# Patient Record
Sex: Female | Born: 1945 | Race: Black or African American | Hispanic: No | Smoking: Former smoker
Health system: Southern US, Community
[De-identification: ages and names within clinical notes are randomized; demographics above are authoritative.]

## PROBLEM LIST (undated history)

## (undated) DIAGNOSIS — Z8701 Personal history of pneumonia (recurrent): Secondary | ICD-10-CM

## (undated) DIAGNOSIS — R519 Headache, unspecified: Secondary | ICD-10-CM

## (undated) DIAGNOSIS — J449 Chronic obstructive pulmonary disease, unspecified: Secondary | ICD-10-CM

## (undated) DIAGNOSIS — J329 Chronic sinusitis, unspecified: Secondary | ICD-10-CM

## (undated) DIAGNOSIS — E785 Hyperlipidemia, unspecified: Secondary | ICD-10-CM

## (undated) DIAGNOSIS — R51 Headache: Secondary | ICD-10-CM

## (undated) DIAGNOSIS — D126 Benign neoplasm of colon, unspecified: Secondary | ICD-10-CM

## (undated) DIAGNOSIS — J302 Other seasonal allergic rhinitis: Secondary | ICD-10-CM

## (undated) DIAGNOSIS — M1990 Unspecified osteoarthritis, unspecified site: Secondary | ICD-10-CM

## (undated) DIAGNOSIS — E039 Hypothyroidism, unspecified: Secondary | ICD-10-CM

## (undated) DIAGNOSIS — I10 Essential (primary) hypertension: Secondary | ICD-10-CM

## (undated) DIAGNOSIS — I341 Nonrheumatic mitral (valve) prolapse: Secondary | ICD-10-CM

## (undated) DIAGNOSIS — R0600 Dyspnea, unspecified: Secondary | ICD-10-CM

## (undated) DIAGNOSIS — M8580 Other specified disorders of bone density and structure, unspecified site: Secondary | ICD-10-CM

## (undated) HISTORY — DX: Hyperlipidemia, unspecified: E78.5

## (undated) HISTORY — DX: Other specified disorders of bone density and structure, unspecified site: M85.80

## (undated) HISTORY — DX: Nonrheumatic mitral (valve) prolapse: I34.1

## (undated) HISTORY — PX: DILATION AND CURETTAGE OF UTERUS: SHX78

## (undated) HISTORY — DX: Headache, unspecified: R51.9

## (undated) HISTORY — DX: Other seasonal allergic rhinitis: J30.2

## (undated) HISTORY — PX: TONSILLECTOMY: SUR1361

## (undated) HISTORY — DX: Chronic obstructive pulmonary disease, unspecified: J44.9

## (undated) HISTORY — DX: Headache: R51

## (undated) HISTORY — PX: THYROIDECTOMY: SHX17

## (undated) HISTORY — DX: Benign neoplasm of colon, unspecified: D12.6

## (undated) HISTORY — DX: Personal history of pneumonia (recurrent): Z87.01

## (undated) HISTORY — DX: Unspecified osteoarthritis, unspecified site: M19.90

## (undated) HISTORY — PX: CATARACT EXTRACTION: SUR2

## (undated) HISTORY — DX: Chronic sinusitis, unspecified: J32.9

## (undated) HISTORY — DX: Hypothyroidism, unspecified: E03.9

---

## 2000-10-16 ENCOUNTER — Emergency Department (HOSPITAL_COMMUNITY): Admission: EM | Admit: 2000-10-16 | Discharge: 2000-10-16 | Payer: Self-pay | Admitting: Emergency Medicine

## 2000-10-31 ENCOUNTER — Emergency Department (HOSPITAL_COMMUNITY): Admission: EM | Admit: 2000-10-31 | Discharge: 2000-10-31 | Payer: Self-pay | Admitting: Emergency Medicine

## 2001-02-03 ENCOUNTER — Inpatient Hospital Stay (HOSPITAL_COMMUNITY): Admission: EM | Admit: 2001-02-03 | Discharge: 2001-02-05 | Payer: Self-pay | Admitting: *Deleted

## 2001-12-10 ENCOUNTER — Encounter: Admission: RE | Admit: 2001-12-10 | Discharge: 2001-12-10 | Payer: Self-pay | Admitting: Family Medicine

## 2001-12-10 ENCOUNTER — Encounter: Payer: Self-pay | Admitting: Family Medicine

## 2003-01-24 LAB — HM PAP SMEAR

## 2005-08-08 ENCOUNTER — Emergency Department (HOSPITAL_COMMUNITY): Admission: EM | Admit: 2005-08-08 | Discharge: 2005-08-08 | Payer: Self-pay | Admitting: Emergency Medicine

## 2010-02-13 ENCOUNTER — Encounter: Payer: Self-pay | Admitting: Interventional Cardiology

## 2010-03-12 ENCOUNTER — Emergency Department (HOSPITAL_COMMUNITY): Payer: Medicare Other

## 2010-03-12 ENCOUNTER — Emergency Department (HOSPITAL_COMMUNITY)
Admission: EM | Admit: 2010-03-12 | Discharge: 2010-03-12 | Disposition: A | Discharge status: Home / Self Care | Payer: Medicare Other | Attending: Emergency Medicine | Admitting: Emergency Medicine

## 2010-03-12 DIAGNOSIS — J449 Chronic obstructive pulmonary disease, unspecified: Secondary | ICD-10-CM | POA: Insufficient documentation

## 2010-03-12 DIAGNOSIS — J3489 Other specified disorders of nose and nasal sinuses: Secondary | ICD-10-CM | POA: Insufficient documentation

## 2010-03-12 DIAGNOSIS — R0982 Postnasal drip: Secondary | ICD-10-CM | POA: Insufficient documentation

## 2010-03-12 DIAGNOSIS — R0602 Shortness of breath: Secondary | ICD-10-CM | POA: Insufficient documentation

## 2010-03-12 DIAGNOSIS — R05 Cough: Secondary | ICD-10-CM | POA: Insufficient documentation

## 2010-03-12 DIAGNOSIS — R059 Cough, unspecified: Secondary | ICD-10-CM | POA: Insufficient documentation

## 2010-03-12 DIAGNOSIS — J4489 Other specified chronic obstructive pulmonary disease: Secondary | ICD-10-CM | POA: Insufficient documentation

## 2010-06-24 LAB — HM MAMMOGRAPHY

## 2010-07-05 ENCOUNTER — Encounter: Payer: Self-pay | Admitting: Gastroenterology

## 2010-07-14 ENCOUNTER — Encounter: Payer: Self-pay | Admitting: Family Medicine

## 2010-07-14 DIAGNOSIS — J449 Chronic obstructive pulmonary disease, unspecified: Secondary | ICD-10-CM

## 2010-07-14 DIAGNOSIS — E039 Hypothyroidism, unspecified: Secondary | ICD-10-CM

## 2010-07-14 DIAGNOSIS — R519 Headache, unspecified: Secondary | ICD-10-CM

## 2010-07-14 DIAGNOSIS — M8580 Other specified disorders of bone density and structure, unspecified site: Secondary | ICD-10-CM

## 2010-07-14 DIAGNOSIS — E785 Hyperlipidemia, unspecified: Secondary | ICD-10-CM

## 2010-07-25 ENCOUNTER — Encounter: Payer: Self-pay | Admitting: Gastroenterology

## 2010-07-25 ENCOUNTER — Ambulatory Visit (AMBULATORY_SURGERY_CENTER): Payer: Medicare Other | Admitting: *Deleted

## 2010-07-25 VITALS — Ht 65.0 in | Wt 174.9 lb

## 2010-07-25 DIAGNOSIS — Z1211 Encounter for screening for malignant neoplasm of colon: Secondary | ICD-10-CM

## 2010-07-25 MED ORDER — PEG-KCL-NACL-NASULF-NA ASC-C 100 G PO SOLR: ORAL | Status: DC

## 2010-07-29 ENCOUNTER — Other Ambulatory Visit: Payer: Self-pay | Admitting: Family Medicine

## 2010-07-29 ENCOUNTER — Ambulatory Visit (HOSPITAL_COMMUNITY)
Admission: RE | Admit: 2010-07-29 | Discharge: 2010-07-29 | Disposition: A | Discharge status: Home / Self Care | Payer: Medicare Other | Source: Ambulatory Visit | Attending: Family Medicine | Admitting: Family Medicine

## 2010-07-29 DIAGNOSIS — M5137 Other intervertebral disc degeneration, lumbosacral region: Secondary | ICD-10-CM | POA: Insufficient documentation

## 2010-07-29 DIAGNOSIS — M545 Low back pain, unspecified: Secondary | ICD-10-CM

## 2010-07-29 DIAGNOSIS — M51379 Other intervertebral disc degeneration, lumbosacral region without mention of lumbar back pain or lower extremity pain: Secondary | ICD-10-CM | POA: Insufficient documentation

## 2010-08-10 ENCOUNTER — Ambulatory Visit (AMBULATORY_SURGERY_CENTER): Payer: Medicare Other | Admitting: Gastroenterology

## 2010-08-10 ENCOUNTER — Encounter: Payer: Self-pay | Admitting: Gastroenterology

## 2010-08-10 DIAGNOSIS — Z1211 Encounter for screening for malignant neoplasm of colon: Secondary | ICD-10-CM

## 2010-08-10 DIAGNOSIS — D126 Benign neoplasm of colon, unspecified: Secondary | ICD-10-CM

## 2010-08-10 MED ORDER — SODIUM CHLORIDE 0.9 % IV SOLN: 500.0000 mL | INTRAVENOUS | Status: DC

## 2010-08-10 NOTE — Patient Instructions (Addendum)
FOLLOW DISCHARGE INSTRUCTIONS( BLUE& GREEN SHEETS)    iNFORMATION ON POLYPS GIVEN

## 2010-08-11 ENCOUNTER — Telehealth: Payer: Self-pay | Admitting: *Deleted

## 2010-08-11 NOTE — Telephone Encounter (Signed)
No ID on voice mail.   No message left. 

## 2010-10-11 ENCOUNTER — Ambulatory Visit (HOSPITAL_COMMUNITY)
Admission: RE | Admit: 2010-10-11 | Discharge: 2010-10-11 | Disposition: A | Discharge status: Home / Self Care | Payer: Medicare Other | Source: Ambulatory Visit | Attending: Family Medicine | Admitting: Family Medicine

## 2010-10-11 ENCOUNTER — Other Ambulatory Visit: Payer: Self-pay | Admitting: Family Medicine

## 2010-10-11 DIAGNOSIS — M238X9 Other internal derangements of unspecified knee: Secondary | ICD-10-CM

## 2010-10-11 DIAGNOSIS — M25569 Pain in unspecified knee: Secondary | ICD-10-CM | POA: Insufficient documentation

## 2011-05-31 ENCOUNTER — Other Ambulatory Visit (HOSPITAL_COMMUNITY): Payer: Self-pay | Admitting: Orthopedic Surgery

## 2011-05-31 DIAGNOSIS — M25569 Pain in unspecified knee: Secondary | ICD-10-CM

## 2011-06-02 ENCOUNTER — Ambulatory Visit (HOSPITAL_COMMUNITY)
Admission: RE | Admit: 2011-06-02 | Discharge: 2011-06-02 | Disposition: A | Discharge status: Home / Self Care | Payer: Medicare Other | Source: Ambulatory Visit | Attending: Orthopedic Surgery | Admitting: Orthopedic Surgery

## 2011-06-02 DIAGNOSIS — M25569 Pain in unspecified knee: Secondary | ICD-10-CM

## 2011-06-02 DIAGNOSIS — M23302 Other meniscus derangements, unspecified lateral meniscus, unspecified knee: Secondary | ICD-10-CM | POA: Insufficient documentation

## 2011-06-02 DIAGNOSIS — M25469 Effusion, unspecified knee: Secondary | ICD-10-CM | POA: Insufficient documentation

## 2011-06-02 DIAGNOSIS — M898X9 Other specified disorders of bone, unspecified site: Secondary | ICD-10-CM | POA: Insufficient documentation

## 2011-08-09 ENCOUNTER — Encounter: Payer: Self-pay | Admitting: Gastroenterology

## 2011-09-18 ENCOUNTER — Ambulatory Visit (INDEPENDENT_AMBULATORY_CARE_PROVIDER_SITE_OTHER): Payer: Medicare Other | Admitting: Gastroenterology

## 2011-09-18 ENCOUNTER — Encounter: Payer: Self-pay | Admitting: Gastroenterology

## 2011-09-18 VITALS — BP 102/72 | HR 76 | Ht 65.25 in | Wt 181.1 lb

## 2011-09-18 DIAGNOSIS — Z860100 Personal history of colon polyps, unspecified: Secondary | ICD-10-CM | POA: Insufficient documentation

## 2011-09-18 DIAGNOSIS — K222 Esophageal obstruction: Secondary | ICD-10-CM

## 2011-09-18 NOTE — Assessment & Plan Note (Signed)
Plan follow-up colonoscopy 2017 

## 2011-09-18 NOTE — Patient Instructions (Addendum)
Upper GI Endoscopy Upper GI endoscopy means using a flexible scope to look at the esophagus, stomach, and upper small bowel. This is done to make a diagnosis in people with heartburn, abdominal pain, or abnormal bleeding. Sometimes an endoscope is needed to remove foreign bodies or food that become stuck in the esophagus; it can also be used to take biopsy samples. For the best results, do not eat or drink for 8 hours before having your upper endoscopy.  To perform the endoscopy, you will probably be sedated and your throat will be numbed with a special spray. The endoscope is then slowly passed down your throat (this will not interfere with your breathing). An endoscopy exam takes 15 to 30 minutes to complete and there is no real pain. Patients rarely remember much about the procedure. The results of the test may take several days if a biopsy or other test is taken.  You may have a sore throat after an endoscopy exam. Serious complications are very rare. Stick to liquids and soft foods until your pain is better. Do not drive a car or operate any dangerous equipment for at least 24 hours after being sedated. SEEK IMMEDIATE MEDICAL CARE IF:   You have severe throat pain.   You have shortness of breath.   You have bleeding problems.   You have a fever.   You have difficulty recovering from your sedation.  Document Released: 02/17/2004 Document Revised: 12/29/2010 Document Reviewed: 01/12/2008 [facility] Patient Information 2012 [place], [place].  Esophageal Stricture The esophagus is the long, narrow tube which carries food and liquid from the mouth to the stomach. Sometimes a part of the esophagus becomes narrow and makes it difficult, painful, or even impossible to swallow. This is called an esophageal stricture.  CAUSES  Common causes of blockage or strictures of the esophagus are:  Exposure of the lower esophagus to the acid from the stomach may cause narrowing.   Hiatal hernia in which a  small part of the stomach bulges up through the diaphragm can cause a narrowing in the bottom of the esophagus.   Scleroderma is a tissue disorder that affects the esophagus and makes swallowing difficult.   Achalasia is an absence of nerves in the lower esophagus and to the esophageal sphincter. This absence of nerves may be congenital (present since birth). This can cause irregular spasms which do not allow food and fluid through.   Strictures may develop from swallowing materials which damage the esophagus. Examples are acids or alkalis such as lye.   Schatzki's Ring is a narrow ring of non-cancerous tissue which narrows the lower esophagus. The cause of this is unknown.   Growths can block the esophagus.  SYMPTOMS  Some of the problems are difficulty swallowing or pain with swallowing. DIAGNOSIS  Your caregiver often suspects this problem by taking a medical history. They will also do a physical exam. They may then take X-rays and/or perform an endoscopy. Endoscopy is an exam in which a tube like a small flexible telescope is used to look at your esophagus.  TREATMENT  One form of treatment is to dilate the narrow area. This means to stretch it.   When this is not successful, chest surgery may be required. This is a much more extensive form of treatment with a longer recovery time.  Both of the above treatments make the passage of food and water into the stomach easier. They also make it easier for stomach contents to bubble back into the esophagus.  Special medications may be used following the procedure to help prevent further narrowing. Medications may be used to lower the amount of acid in the stomach juice.  SEEK IMMEDIATE MEDICAL CARE IF:   Your swallowing is becoming more painful, difficult, or you are unable to swallow.   You vomit up blood.   You develop black tarry stools.   You develop chills.   You have a fever.   You develop chest or abdominal pain.   You develop  shortness of breath, feel lightheaded, or faint.  Follow up with medical care as your caregiver suggests. Document Released: 09/19/2005 Document Revised: 12/29/2010 Document Reviewed: 10/26/2005 [facility] Patient Information 2012 [place], [place].

## 2011-09-18 NOTE — Progress Notes (Signed)
History of Present Illness: Pleasant 66 year old Afro-American female referred at the request of Dr. Tanya Nones for evaluation of dysphagia. She has been complaining of dysphagia for several months. Approximately 7 weeks ago she had a food impaction while on vacation. This was removed endoscopically. He has occasional pyrosis.    Past Medical History  Diagnosis Date  . MVP (mitral valve prolapse)     palpitations  . Generalized headaches   . COPD (chronic obstructive pulmonary disease)   . Elevated lipids   . Osteopenia   . Hypothyroidism   . Arthritis   . History of pneumonia    Past Surgical History  Procedure Date  . Thyroidectomy   . Dilation and curettage of uterus    family history includes Breast cancer in her maternal aunt; Diabetes in her brother; and Heart attack in her maternal grandmother, mother, and unspecified family member.  There is no history of Colon cancer. Current Outpatient Prescriptions  Medication Sig Dispense Refill  . Acetylcysteine (NAC) 600 MG CAPS Take 1 capsule by mouth daily.      Marland Kitchen albuterol (PROAIR HFA) 108 (90 BASE) MCG/ACT inhaler Inhale 2 puffs into the lungs every 6 (six) hours as needed.        . AMBULATORY NON FORMULARY MEDICATION Medication Name: Sovereign Silver Bio-Active silver hydrosol Take 6 drops under the tongue every day      . Ascorbic Acid (VITAMIN C) 1000 MG tablet Take 1,000 mg by mouth daily.      Marland Kitchen aspirin 81 MG tablet Take 81 mg by mouth daily.        . cetirizine (ZYRTEC) 10 MG tablet Take 10 mg by mouth daily.      . Cholecalciferol (VITAMIN D-3) 1000 UNITS CAPS Take 1 capsule by mouth daily.      Marland Kitchen dextromethorphan-guaiFENesin (MUCINEX DM) 30-600 MG per 12 hr tablet Take 1 tablet by mouth daily.      . fluticasone (FLONASE) 50 MCG/ACT nasal spray Place 2 sprays into the nose daily.      . Fluticasone-Salmeterol (ADVAIR DISKUS) 250-50 MCG/DOSE AEPB Inhale 1 puff into the lungs every 12 (twelve) hours.        . Homeopathic  Products (ARNICA [place] PO) Take 4 tablets by mouth daily.      Marland Kitchen levothyroxine (SYNTHROID, LEVOTHROID) 50 MCG tablet Take 50 mcg by mouth daily.      Marland Kitchen omeprazole (PRILOSEC OTC) 20 MG tablet Take 20 mg by mouth daily.      . simvastatin (ZOCOR) 40 MG tablet Take 1 tablet by mouth Daily.       Current Facility-Administered Medications  Medication Dose Route Frequency Provider Last Rate Last Dose  . 0.9 %  sodium chloride infusion  500 mL Intravenous Continuous Louis Meckel, MD       Allergies as of 09/18/2011  . (No Known Allergies)    reports that she quit smoking about 6 years ago. She has never used smokeless tobacco. She reports that she does not drink alcohol or use illicit drugs.     Review of Systems: Pertinent positive and negative review of systems were noted in the above HPI section. All other review of systems were otherwise negative.  Vital signs were reviewed in today's medical record Physical Exam: General: Well developed , well nourished, no acute distress Head: Normocephalic and atraumatic Eyes:  sclerae anicteric, EOMI Ears: Normal auditory acuity Mouth: No deformity or lesions Neck: Supple, no masses or thyromegaly Lungs: Clear throughout to auscultation Heart:  Regular rate and rhythm; no murmurs, rubs or bruits Abdomen: Soft, non tender and non distended. No masses, hepatosplenomegaly or hernias noted. Normal Bowel sounds Rectal:deferred Musculoskeletal: Symmetrical with no gross deformities  Skin: No lesions on visible extremities Pulses:  Normal pulses noted Extremities: No clubbing, cyanosis, edema or deformities noted Neurological: Alert oriented x 4, grossly nonfocal Cervical Nodes:  No significant cervical adenopathy Inguinal Nodes: No significant inguinal adenopathy Psychological:  Alert and cooperative. Normal mood and affect

## 2011-09-20 NOTE — Assessment & Plan Note (Signed)
History of recent food impaction an ongoing complaints of dysphagia.  Medications #1 upper endoscopy with dilatation as indicated

## 2011-09-21 ENCOUNTER — Ambulatory Visit (AMBULATORY_SURGERY_CENTER): Payer: Medicare Other | Admitting: Gastroenterology

## 2011-09-21 ENCOUNTER — Encounter: Payer: Self-pay | Admitting: Gastroenterology

## 2011-09-21 VITALS — BP 143/83 | HR 76 | Temp 98.7°F | Resp 30 | Ht 63.0 in | Wt 181.0 lb

## 2011-09-21 DIAGNOSIS — K222 Esophageal obstruction: Secondary | ICD-10-CM

## 2011-09-21 MED ORDER — SODIUM CHLORIDE 0.9 % IV SOLN: 500.0000 mL | INTRAVENOUS | Status: DC

## 2011-09-21 NOTE — Op Note (Addendum)
[facility] [address] [place], [address]   ENDOSCOPY PROCEDURE REPORT  PATIENT: Mary Blevins, Mary Blevins  MR#: [id number] BIRTHDATE: 1945/08/06 , 66  yrs. old GENDER: Female ENDOSCOPIST: Louis Meckel, MD REFERRED BY:  Lynnea Ferrier, M.D. PROCEDURE DATE:  09/21/2011 PROCEDURE:  Elease Hashimoto dilation of esophagus and EGD, diagnostic ASA CLASS:     Class II INDICATIONS:  Recent food impaction in the esophagus;  dysphagia. MEDICATIONS: MAC sedation, administered by CRNA and Propofol (Diprivan) 120 mg IV TOPICAL ANESTHETIC:  DESCRIPTION OF PROCEDURE: After the risks benefits and alternatives of the procedure were thoroughly explained, informed consent was obtained.  The LB GIF-H180 [id number] endoscope was introduced through the mouth and advanced to the second portion of the duodenum. Without limitations.  The instrument was slowly withdrawn as the mucosa was fully examined.      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  52Fr maloney dilator was passed wit moderate resistance.  A small amount of heme was present on the dilator tip.   The remainder of the upper endoscopy exam was otherwise normal. Retroflexed views revealed no abnormalities.     The scope was then withdrawn from the patient and the procedure completed.  COMPLICATIONS: There were no complications. ENDOSCOPIC IMPRESSION: 1.   Stricture was found at the gastroesophageal junction - s/p maloney dilator 2.   The remainder of the upper endoscopy exam was otherwise normal  RECOMMENDATIONS: Call to schedule a follow-up appointment with office 1 month(s)  REPEAT EXAM:  eSigned:  Louis Meckel, MD 10/19/2011 2:24 PM Revised: 10/19/2011 2:24 PM  CC:

## 2011-09-21 NOTE — Patient Instructions (Addendum)
YOU HAD AN ENDOSCOPIC PROCEDURE TODAY AT THE [facility]: Refer to the procedure report that was given to you for any specific questions about what was found during the examination.  If the procedure report does not answer your questions, please call your gastroenterologist to clarify.  If you requested that your care partner not be given the details of your procedure findings, then the procedure report has been included in a sealed envelope for you to review at your convenience later.   DIET: Your first meal following the procedure should be a light meal and then it is ok to progress to your normal diet.  A half-sandwich or bowl of soup is an example of a good first meal.  Heavy or fried foods are harder to digest and may make you feel nauseous or bloated.  Likewise meals heavy in dairy and vegetables can cause extra gas to form and this can also increase the bloating.  Drink plenty of fluids but you should avoid alcoholic beverages for 24 hours.  ACTIVITY: Your care partner should take you home directly after the procedure.  You should plan to take it easy, moving slowly for the rest of the day.  You can resume normal activity the day after the procedure however you should NOT DRIVE or use heavy machinery for 24 hours (because of the sedation medicines used during the test).    SYMPTOMS TO REPORT IMMEDIATELY: A gastroenterologist can be reached at any hour.  During normal business hours, 8:30 AM to 5:00 PM Monday through Friday, call [phone].  After hours and on weekends, please call the GI answering service at [phone] who will take a message and have the physician on call contact you.   Following upper endoscopy (EGD)  Vomiting of blood or coffee ground material  New chest pain or pain under the shoulder blades  Painful or persistently difficult swallowing  New shortness of breath  Fever of 100F or higher  Black, tarry-looking stools  FOLLOW UP: If any biopsies were  taken you will be contacted by phone or by letter within the next 1-3 weeks.  Call your gastroenterologist if you have not heard about the biopsies in 3 weeks.  Our staff will call the home number listed on your records the next business day following your procedure to check on you and address any questions or concerns that you may have at that time regarding the information given to you following your procedure. This is a courtesy call and so if there is no answer at the home number and we have not heard from you through the emergency physician on call, we will assume that you have returned to your regular daily activities without incident.  SIGNATURES/CONFIDENTIALITY: You and/or your care partner have signed paperwork which will be entered into your electronic medical record.  These signatures attest to the fact that that the information above on your After Visit Summary has been reviewed and is understood.  Full responsibility of the confidentiality of this discharge information lies with you and/or your care-partner.  

## 2011-09-21 NOTE — Progress Notes (Addendum)
Patient did not have preoperative order for IV antibiotic SSI prophylaxis. (G8918)  Patient did not experience any of the following events: a burn prior to discharge; a fall within the facility; wrong site/side/patient/procedure/implant event; or a hospital transfer or hospital admission upon discharge from the facility. (G8907)  

## 2011-09-22 ENCOUNTER — Telehealth: Payer: Self-pay

## 2011-09-22 NOTE — Telephone Encounter (Signed)
  Follow up Call-  Call back number 09/21/2011 08/10/2010  Post procedure Call Back phone  # [phone] hm [phone]  Permission to leave phone message Yes -     Patient questions:  Do you have a fever, pain , or abdominal swelling? no Pain Score  0 *  Have you tolerated food without any problems? yes  Have you been able to return to your normal activities? yes  Do you have any questions about your discharge instructions: Diet   no Medications  no Follow up visit  no  Do you have questions or concerns about your Care? no  Actions: * If pain score is 4 or above: No action needed, pain <4.

## 2011-10-19 ENCOUNTER — Encounter: Payer: Self-pay | Admitting: Gastroenterology

## 2011-10-19 ENCOUNTER — Ambulatory Visit (INDEPENDENT_AMBULATORY_CARE_PROVIDER_SITE_OTHER): Payer: Medicare Other | Admitting: Gastroenterology

## 2011-10-19 VITALS — BP 108/72 | HR 72 | Ht 63.0 in | Wt 178.8 lb

## 2011-10-19 DIAGNOSIS — K222 Esophageal obstruction: Secondary | ICD-10-CM

## 2011-10-19 NOTE — Assessment & Plan Note (Signed)
Asymptomatic following Maloney dilatation. Plan to repeat when necessary

## 2011-10-19 NOTE — Progress Notes (Signed)
History of Present Illness:  Mary Blevins has returned following dilation of a distal esophageal stricture. Dysphagia has entirely subsided. Except for occasional pyrosis she has no GI complaints.    Review of Systems: Pertinent positive and negative review of systems were noted in the above HPI section. All other review of systems were otherwise negative.    Current Medications, Allergies, Past Medical History, Past Surgical History, Family History and Social History were reviewed in [employer] electronic medical record  Vital signs were reviewed in today's medical record. Physical Exam: General: Well developed , well nourished, no acute distress

## 2011-10-19 NOTE — Patient Instructions (Addendum)
Please follow up as needed per Dr. Arlyce Dice

## 2012-04-09 ENCOUNTER — Telehealth: Payer: Self-pay | Admitting: Family Medicine

## 2012-04-09 DIAGNOSIS — E039 Hypothyroidism, unspecified: Secondary | ICD-10-CM

## 2012-04-09 DIAGNOSIS — J449 Chronic obstructive pulmonary disease, unspecified: Secondary | ICD-10-CM

## 2012-04-09 MED ORDER — FLUTICASONE-SALMETEROL 250-50 MCG/DOSE IN AEPB: 1.0000 | INHALATION_SPRAY | Freq: Two times a day (BID) | RESPIRATORY_TRACT | Status: DC

## 2012-04-09 MED ORDER — LEVOTHYROXINE SODIUM 50 MCG PO TABS: 50.0000 ug | ORAL_TABLET | Freq: Every day | ORAL | Status: DC

## 2012-04-09 NOTE — Telephone Encounter (Signed)
Medication refilled per protocol. 

## 2012-04-10 ENCOUNTER — Telehealth: Payer: Self-pay | Admitting: Family Medicine

## 2012-04-10 NOTE — Telephone Encounter (Signed)
Refill for advair not needed.  Refill was documented as done 04/09/2012

## 2012-04-11 ENCOUNTER — Telehealth: Payer: Self-pay | Admitting: Family Medicine

## 2012-04-11 ENCOUNTER — Encounter: Payer: Self-pay | Admitting: Family Medicine

## 2012-04-11 MED ORDER — SIMVASTATIN 40 MG PO TABS: 40.0000 mg | ORAL_TABLET | Freq: Every day | ORAL | Status: DC

## 2012-04-11 NOTE — Telephone Encounter (Signed)
Can you please make this an erroneous encounter. Rob distracted me. Haha! Thanks

## 2012-04-11 NOTE — Telephone Encounter (Signed)
Done

## 2012-04-11 NOTE — Telephone Encounter (Signed)
This encounter was created in error - please disregard.

## 2012-05-30 ENCOUNTER — Ambulatory Visit (INDEPENDENT_AMBULATORY_CARE_PROVIDER_SITE_OTHER): Payer: Medicare Other | Admitting: Family Medicine

## 2012-05-30 ENCOUNTER — Encounter: Payer: Self-pay | Admitting: Family Medicine

## 2012-05-30 VITALS — BP 140/80 | HR 80 | Temp 98.5°F | Resp 18 | Wt 184.0 lb

## 2012-05-30 DIAGNOSIS — R05 Cough: Secondary | ICD-10-CM

## 2012-05-30 DIAGNOSIS — J302 Other seasonal allergic rhinitis: Secondary | ICD-10-CM

## 2012-05-30 DIAGNOSIS — J309 Allergic rhinitis, unspecified: Secondary | ICD-10-CM

## 2012-05-30 DIAGNOSIS — R059 Cough, unspecified: Secondary | ICD-10-CM

## 2012-05-30 MED ORDER — BENZONATATE 200 MG PO CAPS: 200.0000 mg | ORAL_CAPSULE | Freq: Three times a day (TID) | ORAL | Status: DC | PRN

## 2012-05-30 MED ORDER — CETIRIZINE HCL 10 MG PO TABS: 10.0000 mg | ORAL_TABLET | Freq: Every day | ORAL | Status: DC

## 2012-05-30 NOTE — Progress Notes (Signed)
Subjective:    Patient ID: Mary Blevins, female    DOB: 02-17-1945, 67 y.o.   MRN: [id number]  HPI 3 days of scratchy throat postnasal drip and itchy watery eyes.  Now she's having a cough is productive of clear sputum. She denies any wheezing. She denies any fever. She denies any chills. She denies any chest pain. She's not been using her inhaler.  She has had no known sick contacts. Past Medical History  Diagnosis Date  . MVP (mitral valve prolapse)     palpitations  . Generalized headaches   . COPD (chronic obstructive pulmonary disease)   . Elevated lipids   . Osteopenia   . Hypothyroidism   . Arthritis   . History of pneumonia   . Hyperlipidemia   . Sinusitis    Current Outpatient Prescriptions on File Prior to Visit  Medication Sig Dispense Refill  . albuterol (PROAIR HFA) 108 (90 BASE) MCG/ACT inhaler Inhale 2 puffs into the lungs every 6 (six) hours as needed.        Marland Kitchen aspirin 81 MG tablet Take 81 mg by mouth daily.        . Cholecalciferol (VITAMIN D-3) 1000 UNITS CAPS Take 1 capsule by mouth daily.      Marland Kitchen dextromethorphan-guaiFENesin (MUCINEX DM) 30-600 MG per 12 hr tablet Take 1 tablet by mouth daily.      . fluticasone (FLONASE) 50 MCG/ACT nasal spray Place 2 sprays into the nose daily.      . Fluticasone-Salmeterol (ADVAIR DISKUS) 250-50 MCG/DOSE AEPB Inhale 1 puff into the lungs every 12 (twelve) hours.  60 each  5  . levothyroxine (SYNTHROID, LEVOTHROID) 50 MCG tablet Take 1 tablet (50 mcg total) by mouth daily.  30 tablet  5  . naphazoline-glycerin (CLEAR EYES) 0.012-0.2 % SOLN Place 1-2 drops into both eyes every 4 (four) hours as needed.      . simvastatin (ZOCOR) 40 MG tablet Take 1 tablet (40 mg total) by mouth at bedtime.  30 tablet  5   Current Facility-Administered Medications on File Prior to Visit  Medication Dose Route Frequency Provider Last Rate Last Dose  . 0.9 %  sodium chloride infusion  500 mL Intravenous Continuous Louis Meckel, MD        No Known Allergies History   Social History  . Marital Status: Widowed    Spouse Name: N/A    Number of Children: 0  . Years of Education: N/A   Occupational History  .     Social History Main Topics  . Smoking status: Former Smoker    Quit date: 07/24/2005  . Smokeless tobacco: Never Used  . Alcohol Use: No  . Drug Use: No  . Sexually Active:    Other Topics Concern  . Not on file   Social History Narrative  . No narrative on file      Review of Systems Remainder of review of systems is negative    Objective:   Physical Exam  Constitutional: She appears well-developed and well-nourished.  HENT:  Right Ear: External ear normal.  Left Ear: External ear normal.  Nose: Nose normal.  Mouth/Throat: Oropharynx is clear and moist.  Eyes: Conjunctivae are normal. Pupils are equal, round, and reactive to light.  Neck: Neck supple.  Cardiovascular: Normal rate and normal heart sounds.   Pulmonary/Chest: Effort normal and breath sounds normal. No respiratory distress. She has no wheezes. She has no rales. She exhibits no tenderness.  Lymphadenopathy:  She has no cervical adenopathy.          Assessment & Plan:  1. Seasonal allergies Is mainly her allergies it may be slightly exacerbating her underlying COPD. Therefore I'm going to add Zyrtec 10 mg by mouth daily to her Flonase. The patient is instructed to call me immediately if she develops wheezing or purulent sputum or fever. I would have a low threshold for antibiotics and steroids in this patient. However right now I will treat the allergic trigger I feel is causing her symptoms.  2. Cough Symptomatic therapy with Tessalon Perles 200 mg Q8 hours when necessary cough.

## 2012-07-19 ENCOUNTER — Encounter: Payer: Self-pay | Admitting: Family Medicine

## 2012-07-22 ENCOUNTER — Encounter: Payer: Self-pay | Admitting: Family Medicine

## 2012-07-22 ENCOUNTER — Ambulatory Visit (INDEPENDENT_AMBULATORY_CARE_PROVIDER_SITE_OTHER): Payer: Medicare Other | Admitting: Family Medicine

## 2012-07-22 ENCOUNTER — Ambulatory Visit: Payer: Self-pay | Admitting: Family Medicine

## 2012-07-22 VITALS — BP 100/60 | HR 60 | Temp 97.2°F | Resp 16 | Wt 182.0 lb

## 2012-07-22 DIAGNOSIS — M545 Low back pain, unspecified: Secondary | ICD-10-CM

## 2012-07-22 DIAGNOSIS — M25559 Pain in unspecified hip: Secondary | ICD-10-CM

## 2012-07-22 DIAGNOSIS — M549 Dorsalgia, unspecified: Secondary | ICD-10-CM

## 2012-07-22 DIAGNOSIS — M25552 Pain in left hip: Secondary | ICD-10-CM | POA: Insufficient documentation

## 2012-07-22 MED ORDER — TRAMADOL HCL 50 MG PO TABS: ORAL_TABLET | ORAL | Status: DC

## 2012-07-22 MED ORDER — NAPROXEN 500 MG PO TABS: 500.0000 mg | ORAL_TABLET | Freq: Two times a day (BID) | ORAL | Status: DC

## 2012-07-22 NOTE — Assessment & Plan Note (Signed)
Chronic back pain, obtain lumbar xray no imaging, start naprosyn BID, ultram as needed F/u ortho

## 2012-07-22 NOTE — Patient Instructions (Addendum)
Naprosyn for inflammation Ultram for pain Get the xrays of back and hip We will send copies to Dr. Merlyn Albert  F/U as needed

## 2012-07-22 NOTE — Assessment & Plan Note (Signed)
Concern for bursitis based on exam and description, obtain xray of hip, other differential is OA hip NSAIDS, continue exercises, f/u ortho as scheduled

## 2012-07-22 NOTE — Progress Notes (Signed)
  Subjective:    Patient ID: Mary Blevins, female    DOB: 12-16-1945, 67 y.o.   MRN: [id number]  HPI  Pt here with chronic back pain and left hip pain. 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 Denies tingling numbness in lower extremity. Left hip pain for the past 3 weeks, no fall. Has pain right in joint, sore to touch. Has appt with Dr. Merlyn Albert who is her orthopedist in 2 weeks.She feels very stiff in the morning and after sitting long periods of time after she gets going in AM back improves Takes OTC arthritis pain reliever with some improvement  Review of Systems  -per above  GEN- denies fatigue, fever, weight loss,weakness, recent illness GU- denies dysuria, hematuria, dribbling, incontinence MSK- + joint pain, muscle aches, injury        Objective:   Physical Exam GEN-NAD,alert and oriented x 3 MSK- Back- TTP lumbar spine and left buttocks, neg SLR, pain with extension/flexion.   HIP- pain with IR left hip,neg hip rock, TTP over greater trochanter, good ROM Right hip NEURO- CNII-XII in tact, sensation grossly in tact, DTR symmetric bilat LE, motor equal bilat, good tone , stiff antalgic gait        Assessment & Plan:

## 2012-07-23 ENCOUNTER — Ambulatory Visit (HOSPITAL_COMMUNITY)
Admission: RE | Admit: 2012-07-23 | Discharge: 2012-07-23 | Disposition: A | Discharge status: Home / Self Care | Payer: Medicare Other | Source: Ambulatory Visit | Attending: Family Medicine | Admitting: Family Medicine

## 2012-07-23 DIAGNOSIS — M545 Low back pain, unspecified: Secondary | ICD-10-CM | POA: Insufficient documentation

## 2012-07-23 DIAGNOSIS — M51379 Other intervertebral disc degeneration, lumbosacral region without mention of lumbar back pain or lower extremity pain: Secondary | ICD-10-CM | POA: Insufficient documentation

## 2012-07-23 DIAGNOSIS — M25559 Pain in unspecified hip: Secondary | ICD-10-CM | POA: Insufficient documentation

## 2012-07-23 DIAGNOSIS — M5137 Other intervertebral disc degeneration, lumbosacral region: Secondary | ICD-10-CM | POA: Insufficient documentation

## 2012-07-23 DIAGNOSIS — M25552 Pain in left hip: Secondary | ICD-10-CM

## 2012-08-27 ENCOUNTER — Ambulatory Visit (INDEPENDENT_AMBULATORY_CARE_PROVIDER_SITE_OTHER): Payer: Medicare Other | Admitting: Family Medicine

## 2012-08-27 ENCOUNTER — Encounter: Payer: Self-pay | Admitting: Family Medicine

## 2012-08-27 VITALS — BP 140/78 | HR 78 | Temp 99.0°F | Resp 18 | Wt 180.0 lb

## 2012-08-27 DIAGNOSIS — R059 Cough, unspecified: Secondary | ICD-10-CM

## 2012-08-27 DIAGNOSIS — R05 Cough: Secondary | ICD-10-CM

## 2012-08-27 MED ORDER — LEVALBUTEROL HCL 1.25 MG/3ML IN NEBU: 1.2500 mg | INHALATION_SOLUTION | Freq: Three times a day (TID) | RESPIRATORY_TRACT | Status: DC | PRN

## 2012-08-27 NOTE — Progress Notes (Signed)
Subjective:    Patient ID: Mary Blevins, female    DOB: 07/10/1945, 67 y.o.   MRN: [id number]  HPI Patient reports a daily cough productive of clear to yellow sputum. He had recently gotten worse. However the patient states that her last few days it has improved. She denies any hemoptysis. She denies any fever. She denies any shortness of breath. She uses Xopenex nebs as needed and Advair twice a day.  She states she is back to her baseline. She would like me to evaluate her lungs to make sure she does not have any infection. Past Medical History  Diagnosis Date  . MVP (mitral valve prolapse)     palpitations  . Generalized headaches   . COPD (chronic obstructive pulmonary disease)   . Elevated lipids   . Osteopenia   . Hypothyroidism   . Arthritis   . History of pneumonia   . Hyperlipidemia   . Sinusitis    Current Outpatient Prescriptions on File Prior to Visit  Medication Sig Dispense Refill  . aspirin 81 MG tablet Take 81 mg by mouth daily.        . Cholecalciferol (VITAMIN D-3) 1000 UNITS CAPS Take 1 capsule by mouth daily.      Marland Kitchen dextromethorphan-guaiFENesin (MUCINEX DM) 30-600 MG per 12 hr tablet Take 1 tablet by mouth daily.      . fluticasone (FLONASE) 50 MCG/ACT nasal spray Place 2 sprays into the nose daily.      . Fluticasone-Salmeterol (ADVAIR DISKUS) 250-50 MCG/DOSE AEPB Inhale 1 puff into the lungs every 12 (twelve) hours.  60 each  5  . levothyroxine (SYNTHROID, LEVOTHROID) 50 MCG tablet Take 1 tablet (50 mcg total) by mouth daily.  30 tablet  5  . naphazoline-glycerin (CLEAR EYES) 0.012-0.2 % SOLN Place 1-2 drops into both eyes every 4 (four) hours as needed.      . naproxen (NAPROSYN) 500 MG tablet Take 1 tablet (500 mg total) by mouth 2 (two) times daily with a meal.  60 tablet  3  . omeprazole (PRILOSEC) 20 MG capsule Take 20 mg by mouth daily.      Bertram Gala Glycol-Propyl Glycol (SYSTANE) 0.4-0.3 % SOLN Apply to eye.      . simvastatin (ZOCOR) 40 MG  tablet Take 1 tablet (40 mg total) by mouth at bedtime.  30 tablet  5  . traMADol (ULTRAM) 50 MG tablet Take 1-2 every 6 hours as needed  45 tablet  1   Current Facility-Administered Medications on File Prior to Visit  Medication Dose Route Frequency Provider Last Rate Last Dose  . 0.9 %  sodium chloride infusion  500 mL Intravenous Continuous Louis Meckel, MD       No Known Allergies History   Social History  . Marital Status: Widowed    Spouse Name: N/A    Number of Children: 0  . Years of Education: N/A   Occupational History  .     Social History Main Topics  . Smoking status: Former Smoker    Quit date: 07/24/2005  . Smokeless tobacco: Never Used  . Alcohol Use: No  . Drug Use: No  . Sexually Active:    Other Topics Concern  . Not on file   Social History Narrative  . No narrative on file      Review of Systems  All other systems reviewed and are negative.       Objective:   Physical Exam  Vitals reviewed. HENT:  Right Ear: External ear normal.  Left Ear: External ear normal.  Nose: Nose normal.  Mouth/Throat: Oropharynx is clear and moist.  Cardiovascular: Normal rate, regular rhythm and normal heart sounds.   No murmur heard. Pulmonary/Chest: Effort normal and breath sounds normal. No respiratory distress. She has no wheezes. She has no rales.  Abdominal: Soft. Bowel sounds are normal. She exhibits no distension. There is no tenderness. There is no rebound.          Assessment & Plan:  1. Cough Pulmonary exam today is normal. I hear no wheezes crackles or rales.  I believe her daily cough is her baseline cough due to COPD.  There is no evidence today of a COPD exacerbation or infection.  I recommended continuing Advair twice a day. I also refilled her Xopenex nebs that she uses as needed sparingly.

## 2012-09-09 ENCOUNTER — Encounter: Payer: Self-pay | Admitting: Family Medicine

## 2012-10-02 ENCOUNTER — Other Ambulatory Visit: Payer: Self-pay | Admitting: Family Medicine

## 2012-10-09 ENCOUNTER — Other Ambulatory Visit: Payer: Self-pay | Admitting: Family Medicine

## 2012-10-09 ENCOUNTER — Encounter: Payer: Self-pay | Admitting: Physician Assistant

## 2012-10-09 ENCOUNTER — Ambulatory Visit (INDEPENDENT_AMBULATORY_CARE_PROVIDER_SITE_OTHER): Payer: Medicare Other | Admitting: Physician Assistant

## 2012-10-09 VITALS — BP 96/60 | HR 78 | Temp 98.1°F | Resp 20 | Ht 65.5 in | Wt 179.0 lb

## 2012-10-09 DIAGNOSIS — E039 Hypothyroidism, unspecified: Secondary | ICD-10-CM

## 2012-10-09 DIAGNOSIS — R42 Dizziness and giddiness: Secondary | ICD-10-CM

## 2012-10-09 DIAGNOSIS — G4700 Insomnia, unspecified: Secondary | ICD-10-CM

## 2012-10-09 LAB — CBC WITH DIFFERENTIAL/PLATELET
Basophils Absolute: 0 10*3/uL (ref 0.0–0.1)
Basophils Relative: 0 % (ref 0–1)
Eosinophils Absolute: 0.3 10*3/uL (ref 0.0–0.7)
MCH: 28.6 pg (ref 26.0–34.0)
MCHC: 34.8 g/dL (ref 30.0–36.0)
Neutro Abs: 6 10*3/uL (ref 1.7–7.7)
Neutrophils Relative %: 61 % (ref 43–77)
RDW: 14.1 % (ref 11.5–15.5)

## 2012-10-09 LAB — COMPLETE METABOLIC PANEL WITH GFR
AST: 18 U/L (ref 0–37)
Albumin: 4 g/dL (ref 3.5–5.2)
Alkaline Phosphatase: 50 U/L (ref 39–117)
BUN: 10 mg/dL (ref 6–23)
Potassium: 4.5 mEq/L (ref 3.5–5.3)
Sodium: 138 mEq/L (ref 135–145)
Total Bilirubin: 0.5 mg/dL (ref 0.3–1.2)
Total Protein: 7.4 g/dL (ref 6.0–8.3)

## 2012-10-09 LAB — TSH: TSH: 2.389 u[IU]/mL (ref 0.350–4.500)

## 2012-10-09 MED ORDER — ZOLPIDEM TARTRATE 5 MG PO TABS: 5.0000 mg | ORAL_TABLET | Freq: Every evening | ORAL | Status: DC | PRN

## 2012-10-09 NOTE — Progress Notes (Signed)
Patient ID: [name] MRN: [id number], DOB: 02/25/1945, 67 y.o. Date of Encounter: @DATE @  Chief Complaint:  Chief Complaint  Patient presents with  . Feeling faint    Pt had one episode while driving and now pt just does not feel right    HPI: 67 y.o. year old AA female  presents for evaluation after having an episode of lightheadedness this morning. She says that she has not been sleeping good at night for over a month. Again last night she did not sleep well. She remembers looking at the clock at 4 AM and knows that she never fell back to sleep after that. Not sure how well she had slept even prior to then. Says that she "has a lot on her mind." Says there is a lot going on with her granddaughter and she is worried about her. Nonetheless she had not slept well last night. As well she does not eat breakfast in the mornings prior to going to work. This morning she was driving to work and was at a stoplight and felt lightheaded. It never got to the point that she felt like she was about to actually pass out. No presyncope and no syncope. There was no spinning and true vertigo. Just lightheadedness. She then pulled over at the store and bought a Dr. Reino Kent and a chocolate cake. However she says she was already feeling better even before she ate and drank this. Says the lightheadedness lasted less than 60 seconds and then resolved. At that time she had no associated symptoms of: No palpitations, no chest pressure, tightness, heaviness, squeezing and no shortness of breath at that time. She was concerned until she should come by and evaluated. As well she said that she doesn't think her sugar has been checked and she didn't know she may have something like diabetes. As well she did not know his stress and her lack of sleep are causing this.  She sees Dr. Verdis Prime cardiologist once a year. She says that she had mitral valve prolapse in the past "but that it is gone.'  Says he's only done EKG.  Says at one point she had complaints of shortness of breath but he told her that it was not her heart. She's never had a cardiac catheterization and not sure she's had an echo.    Past Medical History  Diagnosis Date  . MVP (mitral valve prolapse)     palpitations  . Generalized headaches   . COPD (chronic obstructive pulmonary disease)   . Elevated lipids   . Osteopenia   . Hypothyroidism   . Arthritis   . History of pneumonia   . Hyperlipidemia   . Sinusitis      Home Meds: See attached medication section for current medication list. Any medications entered into computer today will not appear on this note's list. The medications listed below were entered prior to today. Current Outpatient Prescriptions on File Prior to Visit  Medication Sig Dispense Refill  . aspirin 81 MG tablet Take 81 mg by mouth daily.        . Cholecalciferol (VITAMIN D-3) 1000 UNITS CAPS Take 1 capsule by mouth daily.      Marland Kitchen dextromethorphan-guaiFENesin (MUCINEX DM) 30-600 MG per 12 hr tablet Take 1 tablet by mouth daily.      . fluticasone (FLONASE) 50 MCG/ACT nasal spray USE 2 SPRAYS EACH SIDE ONCE A DAY  16 g  5  . Fluticasone-Salmeterol (ADVAIR DISKUS) 250-50 MCG/DOSE AEPB Inhale  1 puff into the lungs every 12 (twelve) hours.  60 each  5  . levalbuterol (XOPENEX) 1.25 MG/3ML nebulizer solution Take 1.25 mg by nebulization every 8 (eight) hours as needed for wheezing.  72 mL  12  . naphazoline-glycerin (CLEAR EYES) 0.012-0.2 % SOLN Place 1-2 drops into both eyes every 4 (four) hours as needed.      . naproxen (NAPROSYN) 500 MG tablet Take 1 tablet (500 mg total) by mouth 2 (two) times daily with a meal.  60 tablet  3  . omeprazole (PRILOSEC) 20 MG capsule Take 20 mg by mouth daily.      Bertram Gala Glycol-Propyl Glycol (SYSTANE) 0.4-0.3 % SOLN Apply to eye.      . simvastatin (ZOCOR) 40 MG tablet Take 1 tablet (40 mg total) by mouth at bedtime.  30 tablet  5  . SYNTHROID 50 MCG tablet TAKE 1 TABLET (50 MCG  TOTAL) BY MOUTH DAILY.  30 tablet  5  . traMADol (ULTRAM) 50 MG tablet Take 1-2 every 6 hours as needed  45 tablet  1   Current Facility-Administered Medications on File Prior to Visit  Medication Dose Route Frequency Provider Last Rate Last Dose  . 0.9 %  sodium chloride infusion  500 mL Intravenous Continuous Louis Meckel, MD        Allergies: No Known Allergies  History   Social History  . Marital Status: Widowed    Spouse Name: N/A    Number of Children: 0  . Years of Education: N/A   Occupational History  .     Social History Main Topics  . Smoking status: Former Smoker    Quit date: 07/24/2005  . Smokeless tobacco: Never Used  . Alcohol Use: No  . Drug Use: No  . Sexual Activity:    Other Topics Concern  . Not on file   Social History Narrative  . No narrative on file    Family History  Problem Relation Age of Onset  . Colon cancer Neg Hx   . Esophageal cancer Neg Hx   . Rectal cancer Neg Hx   . Stomach cancer Neg Hx   . Breast cancer Maternal Aunt   . Heart attack Maternal Grandmother   . Heart attack Mother   . Heart attack      niece  . Diabetes Brother   . Prostate cancer Maternal Grandfather      Review of Systems:  See HPI for pertinent ROS. All other ROS negative.    Physical Exam: Blood pressure 120/80, pulse 78, temperature 98.1 F (36.7 C), temperature source Oral, resp. rate 20, height 5' 5.5" (1.664 m), weight 179 lb (81.194 kg)., Body mass index is 29.32 kg/(m^2). check her blood pressure myself and actually rejected 3 times to make sure. I'm getting right around 120/80 at every check. General: Well-nourished well-developed soft American female.  Appears in no acute distress. Neck: Supple. No thyromegaly. No lymphadenopathy. No carotid bruit. Lungs: Clear bilaterally to auscultation without wheezes, rales, or rhonchi. Breathing is unlabored. Heart: RRR with S1 S2. No murmurs, rubs, or gallops. Musculoskeletal:  Strength and tone  normal for age. Extremities/Skin: Warm and dry. No clubbing or cyanosis. No edema. No rashes or suspicious lesions. Neuro: Alert and oriented X 3. Moves all extremities spontaneously. Gait is normal. CNII-XII grossly in tact. Psych:  Responds to questions appropriately with a normal affect.     ASSESSMENT AND PLAN:  67 y.o. year old female with  1. Episodic  lightheadedness - CBC with Differential - COMPLETE METABOLIC PANEL WITH GFR - TSH  2. Hypothyroid - TSH  3. Insomnia - zolpidem (AMBIEN) 5 MG tablet; Take 1 tablet (5 mg total) by mouth at bedtime as needed for sleep.  Dispense: 15 tablet; Refill: 1  We'll check labs rule out anemia electrolyte imbalances and her check her glucose. We'll recheck her TSH since she is on thyroid medicine. Discussed medicines for anxiety and insomnia. She does not want medications for anxiety. Says that multiple family members have used these in the past and had adverse effects. However she had a sister that used Ambien with good results. She prefers to try this route.  We'll check labs and follow up with patient. If she has further episodes and also followup. Otherwise I think her lightheadedness may have just been secondary to lack of sleep, stress, and lack of eating. She says that she will start eating breakfast as well.  10/09/2012 12:51 PM

## 2012-10-11 ENCOUNTER — Other Ambulatory Visit: Payer: Medicare Other

## 2012-10-11 DIAGNOSIS — E782 Mixed hyperlipidemia: Secondary | ICD-10-CM

## 2012-10-11 LAB — LIPID PANEL
HDL: 41 mg/dL (ref >39)
Total CHOL/HDL Ratio: 5.1 Ratio
Triglycerides: 249 mg/dL — ABNORMAL HIGH (ref <150)

## 2012-10-29 ENCOUNTER — Encounter: Payer: Self-pay | Admitting: Family Medicine

## 2012-10-29 ENCOUNTER — Ambulatory Visit (INDEPENDENT_AMBULATORY_CARE_PROVIDER_SITE_OTHER): Payer: Medicare Other | Admitting: Family Medicine

## 2012-10-29 VITALS — BP 90/60 | HR 80 | Temp 98.5°F | Resp 18 | Ht 65.5 in | Wt 180.0 lb

## 2012-10-29 DIAGNOSIS — N644 Mastodynia: Secondary | ICD-10-CM

## 2012-10-29 DIAGNOSIS — Z23 Encounter for immunization: Secondary | ICD-10-CM

## 2012-10-29 NOTE — Progress Notes (Signed)
Subjective:    Patient ID: Mary Blevins, female    DOB: 07/03/1945, 67 y.o.   MRN: [id number]  HPI  Patient is a very pleasant 67 year old American female who presents today complaining of tingling and paresthesias around both breasts. The symptoms are intermittent and have been going on for now over a month. She episodic numbness and tingling on the lateral inferior portions of both breasts. The right seems to be worse than the left. She had a mammogram this year which was normal. She has no visual change in the breast. There is no erythema, cellulitis, mastitis. There has been no shingles-like rash. There is no intertrigo. There is no inciting factor or alleviating factor. She is not recently changed style of her undergarments. Past Medical History  Diagnosis Date  . MVP (mitral valve prolapse)     palpitations  . Generalized headaches   . COPD (chronic obstructive pulmonary disease)   . Elevated lipids   . Osteopenia   . Hypothyroidism   . Arthritis   . History of pneumonia   . Hyperlipidemia   . Sinusitis    Current Outpatient Prescriptions on File Prior to Visit  Medication Sig Dispense Refill  . aspirin 81 MG tablet Take 81 mg by mouth daily.        . Cholecalciferol (VITAMIN D-3) 1000 UNITS CAPS Take 1 capsule by mouth daily.      Marland Kitchen dextromethorphan-guaiFENesin (MUCINEX DM) 30-600 MG per 12 hr tablet Take 1 tablet by mouth daily.      . fluticasone (FLONASE) 50 MCG/ACT nasal spray USE 2 SPRAYS EACH SIDE ONCE A DAY  16 g  5  . Fluticasone-Salmeterol (ADVAIR DISKUS) 250-50 MCG/DOSE AEPB Inhale 1 puff into the lungs every 12 (twelve) hours.  60 each  5  . levalbuterol (XOPENEX) 1.25 MG/3ML nebulizer solution Take 1.25 mg by nebulization every 8 (eight) hours as needed for wheezing.  72 mL  12  . naphazoline-glycerin (CLEAR EYES) 0.012-0.2 % SOLN Place 1-2 drops into both eyes every 4 (four) hours as needed.      . naproxen (NAPROSYN) 500 MG tablet Take 1 tablet (500 mg total)  by mouth 2 (two) times daily with a meal.  60 tablet  3  . omeprazole (PRILOSEC) 20 MG capsule Take 20 mg by mouth daily.      Bertram Gala Glycol-Propyl Glycol (SYSTANE) 0.4-0.3 % SOLN Apply to eye.      . simvastatin (ZOCOR) 40 MG tablet Take 1 tablet (40 mg total) by mouth at bedtime.  30 tablet  5  . SYNTHROID 50 MCG tablet TAKE 1 TABLET (50 MCG TOTAL) BY MOUTH DAILY.  30 tablet  5  . traMADol (ULTRAM) 50 MG tablet Take 1-2 every 6 hours as needed  45 tablet  1  . zolpidem (AMBIEN) 5 MG tablet Take 1 tablet (5 mg total) by mouth at bedtime as needed for sleep.  15 tablet  1   Current Facility-Administered Medications on File Prior to Visit  Medication Dose Route Frequency Provider Last Rate Last Dose  . 0.9 %  sodium chloride infusion  500 mL Intravenous Continuous Louis Meckel, MD       No Known Allergies History   Social History  . Marital Status: Widowed    Spouse Name: N/A    Number of Children: 0  . Years of Education: N/A   Occupational History  .     Social History Main Topics  . Smoking status: Former Smoker  Quit date: 07/24/2005  . Smokeless tobacco: Never Used  . Alcohol Use: No  . Drug Use: No  . Sexual Activity:    Other Topics Concern  . Not on file   Social History Narrative  . No narrative on file     Review of Systems  All other systems reviewed and are negative.       Objective:   Physical Exam  Vitals reviewed. Cardiovascular: Normal rate, regular rhythm and normal heart sounds.   Pulmonary/Chest: Effort normal and breath sounds normal. No respiratory distress. She has no wheezes. She has no rales.  Skin: Skin is warm. No rash noted. No erythema. No pallor.   breast exam is performed. There are no abnormalities on breast exam. I cannot palpate any nodules. There is no cellulitis or mastitis. There are no rashes. There is no lymphadenopathy.        Assessment & Plan:  1. Mastalgia in female-reassured patient that her exam is  completely normal today. I believe the patient is likely experiencing a neuropathic sensation in her breast likely due to compression from an ill fitting bra.  I have recommended that she try different styles of undergarments dyspnea symptoms will improve. If they persist, she is to notify me.  2. Need for prophylactic vaccination and inoculation against influenza - Flu Vaccine QUAD 36+ mos IM

## 2012-11-21 ENCOUNTER — Ambulatory Visit: Payer: Medicare Other | Admitting: Family Medicine

## 2012-12-02 ENCOUNTER — Ambulatory Visit (INDEPENDENT_AMBULATORY_CARE_PROVIDER_SITE_OTHER): Payer: Medicare Other | Admitting: Family Medicine

## 2012-12-02 ENCOUNTER — Encounter: Payer: Self-pay | Admitting: Family Medicine

## 2012-12-02 VITALS — BP 110/74 | HR 80 | Temp 98.2°F | Resp 16 | Ht 65.5 in | Wt 183.0 lb

## 2012-12-02 DIAGNOSIS — J449 Chronic obstructive pulmonary disease, unspecified: Secondary | ICD-10-CM

## 2012-12-02 DIAGNOSIS — J4489 Other specified chronic obstructive pulmonary disease: Secondary | ICD-10-CM

## 2012-12-02 MED ORDER — METRONIDAZOLE 500 MG PO TABS: 500.0000 mg | ORAL_TABLET | Freq: Three times a day (TID) | ORAL | Status: DC

## 2012-12-02 MED ORDER — TIZANIDINE HCL 2 MG PO CAPS: 4.0000 mg | ORAL_CAPSULE | Freq: Three times a day (TID) | ORAL | Status: DC | PRN

## 2012-12-02 NOTE — Progress Notes (Signed)
Subjective:    Patient ID: Mary Blevins, female    DOB: 15-Aug-1945, 66 y.o.   MRN: [id number]  HPI  patient was seen approximately 2 weeks ago in urgent care and diagnosed with bronchitis. She was treated with Levaquin 500 mg by mouth daily for 10 days.  She is here today for followup. She denies any fevers or chills. She is still slightly short of breath. She denies any productive cough or hemoptysis. She denies any chest pain. She does not she may be wheezing more. She usually gets like this with the change in seasons as her allergies will trigger an asthma-like reaction in her COPD. She's currently taking Advair 250/50 one inhalation twice a day. She is using her albuterol maybe once or twice a week. Past Medical History  Diagnosis Date  . MVP (mitral valve prolapse)     palpitations  . Generalized headaches   . COPD (chronic obstructive pulmonary disease)   . Elevated lipids   . Osteopenia   . Hypothyroidism   . Arthritis   . History of pneumonia   . Hyperlipidemia   . Sinusitis    Current Outpatient Prescriptions on File Prior to Visit  Medication Sig Dispense Refill  . aspirin 81 MG tablet Take 81 mg by mouth daily.        . Cholecalciferol (VITAMIN D-3) 1000 UNITS CAPS Take 1 capsule by mouth daily.      Marland Kitchen dextromethorphan-guaiFENesin (MUCINEX DM) 30-600 MG per 12 hr tablet Take 1 tablet by mouth daily.      . fluticasone (FLONASE) 50 MCG/ACT nasal spray USE 2 SPRAYS EACH SIDE ONCE A DAY  16 g  5  . Fluticasone-Salmeterol (ADVAIR DISKUS) 250-50 MCG/DOSE AEPB Inhale 1 puff into the lungs every 12 (twelve) hours.  60 each  5  . levalbuterol (XOPENEX) 1.25 MG/3ML nebulizer solution Take 1.25 mg by nebulization every 8 (eight) hours as needed for wheezing.  72 mL  12  . naphazoline-glycerin (CLEAR EYES) 0.012-0.2 % SOLN Place 1-2 drops into both eyes every 4 (four) hours as needed.      . naproxen (NAPROSYN) 500 MG tablet Take 1 tablet (500 mg total) by mouth 2 (two) times  daily with a meal.  60 tablet  3  . omeprazole (PRILOSEC) 20 MG capsule Take 20 mg by mouth daily.      Bertram Gala Glycol-Propyl Glycol (SYSTANE) 0.4-0.3 % SOLN Apply to eye.      . simvastatin (ZOCOR) 40 MG tablet Take 1 tablet (40 mg total) by mouth at bedtime.  30 tablet  5  . SYNTHROID 50 MCG tablet TAKE 1 TABLET (50 MCG TOTAL) BY MOUTH DAILY.  30 tablet  5  . traMADol (ULTRAM) 50 MG tablet Take 1-2 every 6 hours as needed  45 tablet  1  . zolpidem (AMBIEN) 5 MG tablet Take 1 tablet (5 mg total) by mouth at bedtime as needed for sleep.  15 tablet  1   Current Facility-Administered Medications on File Prior to Visit  Medication Dose Route Frequency Provider Last Rate Last Dose  . 0.9 %  sodium chloride infusion  500 mL Intravenous Continuous Louis Meckel, MD       No Known Allergies History   Social History  . Marital Status: Widowed    Spouse Name: N/A    Number of Children: 0  . Years of Education: N/A   Occupational History  .     Social History Main Topics  . Smoking  status: Former Smoker    Quit date: 07/24/2005  . Smokeless tobacco: Never Used  . Alcohol Use: No  . Drug Use: No  . Sexual Activity:    Other Topics Concern  . Not on file   Social History Narrative  . No narrative on file      Review of Systems  All other systems reviewed and are negative.       Objective:   Physical Exam  Vitals reviewed. Constitutional: She appears well-developed and well-nourished.  HENT:  Right Ear: External ear normal.  Left Ear: External ear normal.  Nose: Nose normal.  Mouth/Throat: Oropharynx is clear and moist.  Eyes: Conjunctivae are normal.  Neck: Neck supple.  Cardiovascular: Normal rate, regular rhythm and normal heart sounds.   Pulmonary/Chest: Effort normal. She has decreased breath sounds. She has no wheezes. She has no rhonchi. She has no rales.  Abdominal: Soft. Bowel sounds are normal. She exhibits no distension. There is no tenderness. There is  no rebound.  Lymphadenopathy:    She has no cervical adenopathy.          Assessment & Plan:  1. COPD (chronic obstructive pulmonary disease)  I believe the bronchitis has resolved. I do not feel she needs further antibiotics. I did recommend discontinuing Advair and trying her on Symbicort 160/4.5 2 puffs inhaled twice a day to see if she will respond to this better.  I also recommended that she liberalize her albuterol use to 2 puffs every 6 hours as needed. I recommended that she return when she feels better for Prevnar 13.

## 2012-12-06 ENCOUNTER — Other Ambulatory Visit: Payer: Self-pay | Admitting: Family Medicine

## 2012-12-24 ENCOUNTER — Telehealth: Payer: Self-pay | Admitting: Family Medicine

## 2012-12-24 MED ORDER — MELOXICAM 15 MG PO TABS: 15.0000 mg | ORAL_TABLET | Freq: Every day | ORAL | Status: DC

## 2012-12-24 NOTE — Telephone Encounter (Signed)
.  Patient aware, rx sent to pharmacy and pt will stop by to get injection

## 2012-12-24 NOTE — Telephone Encounter (Signed)
I would recommend prevnar for her.   Is she taking any arthritis pills.  If not, try mobic 15 poqd.  If so, when could perform a cortisone shot here if necessary for her knees.  They sound like arthritis.

## 2012-12-24 NOTE — Telephone Encounter (Signed)
Pt is calling In regards to knees (sore) since last night she doesn't know if there is fluid but they are sore  front and  Back she says the look swollen she doesn't know if she come in here or go to her ortho doctor She is also calling about the New pneumonia shot that Dr Tanya Nones was telling her about   Call back number is [phone] you can leave a message if she does not answer

## 2013-04-07 ENCOUNTER — Other Ambulatory Visit: Payer: Self-pay | Admitting: Family Medicine

## 2013-04-09 ENCOUNTER — Other Ambulatory Visit: Payer: Medicare HMO

## 2013-04-09 ENCOUNTER — Other Ambulatory Visit: Payer: Self-pay | Admitting: Family Medicine

## 2013-04-09 DIAGNOSIS — E785 Hyperlipidemia, unspecified: Secondary | ICD-10-CM

## 2013-04-09 DIAGNOSIS — Z79899 Other long term (current) drug therapy: Secondary | ICD-10-CM

## 2013-04-09 DIAGNOSIS — E039 Hypothyroidism, unspecified: Secondary | ICD-10-CM

## 2013-04-09 LAB — COMPREHENSIVE METABOLIC PANEL
ALBUMIN: 4 g/dL (ref 3.5–5.2)
ALT: 15 U/L (ref 0–35)
AST: 18 U/L (ref 0–37)
Alkaline Phosphatase: 54 U/L (ref 39–117)
BUN: 11 mg/dL (ref 6–23)
CHLORIDE: 103 meq/L (ref 96–112)
CO2: 28 meq/L (ref 19–32)
Calcium: 9.3 mg/dL (ref 8.4–10.5)
Creat: 0.86 mg/dL (ref 0.50–1.10)
GLUCOSE: 108 mg/dL — AB (ref 70–99)
POTASSIUM: 4.6 meq/L (ref 3.5–5.3)
SODIUM: 139 meq/L (ref 135–145)
TOTAL PROTEIN: 7 g/dL (ref 6.0–8.3)
Total Bilirubin: 0.5 mg/dL (ref 0.2–1.2)

## 2013-04-09 LAB — LIPID PANEL
CHOLESTEROL: 178 mg/dL (ref 0–200)
HDL: 43 mg/dL (ref >39)
LDL CALC: 94 mg/dL (ref 0–99)
TRIGLYCERIDES: 206 mg/dL — AB (ref <150)
Total CHOL/HDL Ratio: 4.1 Ratio
VLDL: 41 mg/dL — ABNORMAL HIGH (ref 0–40)

## 2013-04-09 LAB — CBC WITH DIFFERENTIAL/PLATELET
Basophils Absolute: 0 10*3/uL (ref 0.0–0.1)
Basophils Relative: 0 % (ref 0–1)
Eosinophils Absolute: 0.3 10*3/uL (ref 0.0–0.7)
Eosinophils Relative: 3 % (ref 0–5)
HCT: 40.9 % (ref 36.0–46.0)
HEMOGLOBIN: 13.8 g/dL (ref 12.0–15.0)
LYMPHS ABS: 3.8 10*3/uL (ref 0.7–4.0)
Lymphocytes Relative: 40 % (ref 12–46)
MCH: 28.2 pg (ref 26.0–34.0)
MCHC: 33.7 g/dL (ref 30.0–36.0)
MCV: 83.5 fL (ref 78.0–100.0)
MONOS PCT: 7 % (ref 3–12)
Monocytes Absolute: 0.7 10*3/uL (ref 0.1–1.0)
NEUTROS ABS: 4.8 10*3/uL (ref 1.7–7.7)
NEUTROS PCT: 50 % (ref 43–77)
Platelets: 406 10*3/uL — ABNORMAL HIGH (ref 150–400)
RBC: 4.9 MIL/uL (ref 3.87–5.11)
RDW: 14.3 % (ref 11.5–15.5)
WBC: 9.6 10*3/uL (ref 4.0–10.5)

## 2013-04-09 LAB — TSH: TSH: 2.566 u[IU]/mL (ref 0.350–4.500)

## 2013-04-09 MED ORDER — SIMVASTATIN 40 MG PO TABS: ORAL_TABLET | ORAL | Status: DC

## 2013-04-09 MED ORDER — FLUTICASONE PROPIONATE 50 MCG/ACT NA SUSP: NASAL | Status: DC

## 2013-04-09 MED ORDER — LEVOTHYROXINE SODIUM 50 MCG PO TABS
ORAL_TABLET | ORAL | Status: DC
Start: 2013-04-09 — End: 2014-04-07

## 2013-04-09 NOTE — Telephone Encounter (Signed)
Rx Refilled  

## 2013-04-22 ENCOUNTER — Ambulatory Visit (INDEPENDENT_AMBULATORY_CARE_PROVIDER_SITE_OTHER): Payer: Medicare HMO | Admitting: Family Medicine

## 2013-04-22 ENCOUNTER — Encounter: Payer: Self-pay | Admitting: Family Medicine

## 2013-04-22 VITALS — BP 110/72 | HR 72 | Temp 99.1°F | Resp 18 | Ht 65.5 in | Wt 184.0 lb

## 2013-04-22 DIAGNOSIS — E039 Hypothyroidism, unspecified: Secondary | ICD-10-CM

## 2013-04-22 DIAGNOSIS — J4489 Other specified chronic obstructive pulmonary disease: Secondary | ICD-10-CM

## 2013-04-22 DIAGNOSIS — J449 Chronic obstructive pulmonary disease, unspecified: Secondary | ICD-10-CM

## 2013-04-22 DIAGNOSIS — E785 Hyperlipidemia, unspecified: Secondary | ICD-10-CM

## 2013-04-22 NOTE — Progress Notes (Signed)
Subjective:    Patient ID: Mary Blevins, female    DOB: 23-Nov-1945, 68 y.o.   MRN: [id number]  HPI Patient is here today for followup of her multiple medical problems. She has history of COPD, history of hyperlipidemia, and history of hypothyroidism. Overall she is doing well. She denies any chest pain or shortness of breath. She denies any myalgias or right upper quadrant pain. She denies any fatigue or palpitations. She does have some mild pain at the left first MCP joint but otherwise is doing well. His pain seemed to respond to NSAIDs. She denies any falls or injuries. Lab on 04/09/2013  Component Date Value Ref Range Status  . WBC 04/09/2013 9.6  4.0 - 10.5 K/uL Final  . RBC 04/09/2013 4.90  3.87 - 5.11 MIL/uL Final  . Hemoglobin 04/09/2013 13.8  12.0 - 15.0 g/dL Final  . HCT 04/09/2013 40.9  36.0 - 46.0 % Final  . MCV 04/09/2013 83.5  78.0 - 100.0 fL Final  . MCH 04/09/2013 28.2  26.0 - 34.0 pg Final  . MCHC 04/09/2013 33.7  30.0 - 36.0 g/dL Final  . RDW 04/09/2013 14.3  11.5 - 15.5 % Final  . Platelets 04/09/2013 406* 150 - 400 K/uL Final  . Neutrophils Relative % 04/09/2013 50  43 - 77 % Final  . Neutro Abs 04/09/2013 4.8  1.7 - 7.7 K/uL Final  . Lymphocytes Relative 04/09/2013 40  12 - 46 % Final  . Lymphs Abs 04/09/2013 3.8  0.7 - 4.0 K/uL Final  . Monocytes Relative 04/09/2013 7  3 - 12 % Final  . Monocytes Absolute 04/09/2013 0.7  0.1 - 1.0 K/uL Final  . Eosinophils Relative 04/09/2013 3  0 - 5 % Final  . Eosinophils Absolute 04/09/2013 0.3  0.0 - 0.7 K/uL Final  . Basophils Relative 04/09/2013 0  0 - 1 % Final  . Basophils Absolute 04/09/2013 0.0  0.0 - 0.1 K/uL Final  . Smear Review 04/09/2013 Criteria for review not met   Final  . Sodium 04/09/2013 139  135 - 145 mEq/L Final  . Potassium 04/09/2013 4.6  3.5 - 5.3 mEq/L Final  . Chloride 04/09/2013 103  96 - 112 mEq/L Final  . CO2 04/09/2013 28  19 - 32 mEq/L Final  . Glucose, Bld 04/09/2013 108* 70 - 99 mg/dL  Final  . BUN 04/09/2013 11  6 - 23 mg/dL Final  . Creat 04/09/2013 0.86  0.50 - 1.10 mg/dL Final  . Total Bilirubin 04/09/2013 0.5  0.2 - 1.2 mg/dL Final  . Alkaline Phosphatase 04/09/2013 54  39 - 117 U/L Final  . AST 04/09/2013 18  0 - 37 U/L Final  . ALT 04/09/2013 15  0 - 35 U/L Final  . Total Protein 04/09/2013 7.0  6.0 - 8.3 g/dL Final  . Albumin 04/09/2013 4.0  3.5 - 5.2 g/dL Final  . Calcium 04/09/2013 9.3  8.4 - 10.5 mg/dL Final  . Cholesterol 04/09/2013 178  0 - 200 mg/dL Final   Comment: ATP III Classification:                                < 200        mg/dL        Desirable  200 - 239     mg/dL        Borderline High                               >= 240        mg/dL        High                             . Triglycerides 04/09/2013 206* <150 mg/dL Final  . HDL 04/09/2013 43  >39 mg/dL Final  . Total CHOL/HDL Ratio 04/09/2013 4.1   Final  . VLDL 04/09/2013 41* 0 - 40 mg/dL Final  . LDL Cholesterol 04/09/2013 94  0 - 99 mg/dL Final   Comment:                            Total Cholesterol/HDL Ratio:CHD Risk                                                 Coronary Heart Disease Risk Table                                                                 Men       Women                                   1/2 Average Risk              3.4        3.3                                       Average Risk              5.0        4.4                                    2X Average Risk              9.6        7.1                                    3X Average Risk             23.4       11.0                          Use the calculated Patient Ratio above and the CHD Risk table  to determine the patient's CHD Risk.                          ATP III Classification (LDL):                                < 100        mg/dL         Optimal                               100 - 129     mg/dL         Near or Above Optimal                                130 - 159     mg/dL         Borderline High                               160 - 189     mg/dL         High                                > 190        mg/dL         Very High                             . TSH 04/09/2013 2.566  0.350 - 4.500 uIU/mL Final   Current Outpatient Prescriptions on File Prior to Visit  Medication Sig Dispense Refill  . aspirin 81 MG tablet Take 81 mg by mouth daily.        . Cholecalciferol (VITAMIN D-3) 1000 UNITS CAPS Take 1 capsule by mouth daily.      Marland Kitchen dextromethorphan-guaiFENesin (MUCINEX DM) 30-600 MG per 12 hr tablet Take 1 tablet by mouth daily.      . fluticasone (FLONASE) 50 MCG/ACT nasal spray USE 2 SPRAYS EACH SIDE ONCE A DAY  48 g  3  . Fluticasone-Salmeterol (ADVAIR DISKUS) 250-50 MCG/DOSE AEPB Inhale 1 puff into the lungs every 12 (twelve) hours.  60 each  5  . levalbuterol (XOPENEX) 1.25 MG/3ML nebulizer solution Take 1.25 mg by nebulization every 8 (eight) hours as needed for wheezing.  72 mL  12  . levothyroxine (SYNTHROID) 50 MCG tablet TAKE 1 TABLET (50 MCG TOTAL) BY MOUTH DAILY.  90 tablet  3  . naphazoline-glycerin (CLEAR EYES) 0.012-0.2 % SOLN Place 1-2 drops into both eyes every 4 (four) hours as needed.      . naproxen (NAPROSYN) 500 MG tablet Take 1 tablet (500 mg total) by mouth 2 (two) times daily with a meal.  60 tablet  3  . omeprazole (PRILOSEC) 20 MG capsule Take 20 mg by mouth daily.      Vladimir Faster Glycol-Propyl Glycol (SYSTANE) 0.4-0.3 % SOLN Apply to eye.      . simvastatin (ZOCOR) 40 MG tablet TAKE 1 TABLET (40 MG TOTAL) BY MOUTH AT BEDTIME.  90 tablet  3  . traMADol (ULTRAM) 50 MG tablet Take 1-2 every 6  hours as needed  45 tablet  1  . zolpidem (AMBIEN) 5 MG tablet Take 1 tablet (5 mg total) by mouth at bedtime as needed for sleep.  15 tablet  1  . meloxicam (MOBIC) 15 MG tablet Take 1 tablet (15 mg total) by mouth daily.  30 tablet  3   Current Facility-Administered Medications on File Prior to Visit  Medication Dose  Route Frequency Provider Last Rate Last Dose  . 0.9 %  sodium chloride infusion  500 mL Intravenous Continuous Inda Castle, MD       Past Medical History  Diagnosis Date  . MVP (mitral valve prolapse)     palpitations  . Generalized headaches   . COPD (chronic obstructive pulmonary disease)   . Elevated lipids   . Osteopenia   . Hypothyroidism   . Arthritis   . History of pneumonia   . Hyperlipidemia   . Sinusitis    No Known Allergies History   Social History  . Marital Status: Widowed    Spouse Name: N/A    Number of Children: 0  . Years of Education: N/A   Occupational History  .     Social History Main Topics  . Smoking status: Former Smoker    Quit date: 07/24/2005  . Smokeless tobacco: Never Used  . Alcohol Use: No  . Drug Use: No  . Sexual Activity:    Other Topics Concern  . Not on file   Social History Narrative  . No narrative on file      Review of Systems  All other systems reviewed and are negative.       Objective:   Physical Exam  Vitals reviewed. Eyes: Conjunctivae are normal. No scleral icterus.  Neck: Neck supple. No JVD present. No thyromegaly present.  Cardiovascular: Normal rate, regular rhythm and normal heart sounds.   No murmur heard. Pulmonary/Chest: Effort normal and breath sounds normal. No respiratory distress. She has no wheezes. She has no rales.  Abdominal: Soft. Bowel sounds are normal. She exhibits no distension. There is no tenderness. There is no rebound and no guarding.  Musculoskeletal: Normal range of motion.       Left hand: Normal. She exhibits normal range of motion, no tenderness, no bony tenderness, normal two-point discrimination, no deformity and no swelling. Normal sensation noted. Normal strength noted.  Lymphadenopathy:    She has no cervical adenopathy.          Assessment & Plan:  1. COPD (chronic obstructive pulmonary disease) Well controlled. Continue current medications. I also  recommended Prevnar 13 which the patient received today.  2. HLD (hyperlipidemia) Patient's cholesterol is excellent. Continue Zocor 40 mg by mouth daily. 3. Unspecified hypothyroidism Patient's TSH is within the therapeutic range. Continue levothyroxine at 50 mcg by mouth daily.

## 2013-05-06 ENCOUNTER — Other Ambulatory Visit: Payer: Self-pay | Admitting: Family Medicine

## 2013-05-06 NOTE — Telephone Encounter (Signed)
?   OK to Refill  

## 2013-05-06 NOTE — Telephone Encounter (Signed)
ok 

## 2013-05-07 NOTE — Telephone Encounter (Signed)
RX called in .

## 2013-05-08 ENCOUNTER — Other Ambulatory Visit: Payer: Self-pay | Admitting: Family Medicine

## 2013-05-08 NOTE — Telephone Encounter (Signed)
?   OK to Refill  

## 2013-05-09 NOTE — Telephone Encounter (Signed)
Rx called in 

## 2013-05-09 NOTE — Telephone Encounter (Signed)
ok 

## 2013-05-21 ENCOUNTER — Telehealth: Payer: Self-pay | Admitting: Family Medicine

## 2013-05-21 ENCOUNTER — Encounter: Payer: Self-pay | Admitting: Family Medicine

## 2013-05-21 MED ORDER — ZOLPIDEM TARTRATE 5 MG PO TABS: ORAL_TABLET | ORAL | Status: DC

## 2013-05-21 NOTE — Telephone Encounter (Signed)
PA Approved through 01/22/2014 and pharmacy aware.

## 2013-05-21 NOTE — Telephone Encounter (Signed)
PA Submitted through [email or web address]  

## 2013-05-21 NOTE — Telephone Encounter (Signed)
This encounter was created in error - please disregard.

## 2013-05-23 ENCOUNTER — Ambulatory Visit (INDEPENDENT_AMBULATORY_CARE_PROVIDER_SITE_OTHER): Payer: Medicare HMO | Admitting: Family Medicine

## 2013-05-23 ENCOUNTER — Encounter: Payer: Self-pay | Admitting: Family Medicine

## 2013-05-23 VITALS — BP 126/84 | HR 74 | Temp 98.5°F | Resp 18 | Ht 65.5 in | Wt 184.0 lb

## 2013-05-23 DIAGNOSIS — J019 Acute sinusitis, unspecified: Secondary | ICD-10-CM

## 2013-05-23 DIAGNOSIS — G44209 Tension-type headache, unspecified, not intractable: Secondary | ICD-10-CM

## 2013-05-23 MED ORDER — AMOXICILLIN 875 MG PO TABS: 875.0000 mg | ORAL_TABLET | Freq: Two times a day (BID) | ORAL | Status: DC

## 2013-05-23 MED ORDER — DIAZEPAM 5 MG PO TABS: 5.0000 mg | ORAL_TABLET | Freq: Two times a day (BID) | ORAL | Status: DC | PRN

## 2013-05-23 NOTE — Progress Notes (Signed)
Subjective:    Patient ID: Mary Blevins, female    DOB: [birth year], 68 y.o.   MRN: [id number]  HPI Patient is a very pleasant 68 year old [place] American female who comes in today with a two-week history of a headache located behind both eyes and behind her nasal bridge. It is constant and unrelenting. Times her blood pressures been elevated but at other times her blood pressure is excellent. She denies any vision changes. The headache is constant and nonpulsatile. She denies any neurologic deficits. She does have tenderness at 2 percussion and palpation in her right frontal and left maxillary sinus on exam today. She does have a history of allergies. She denies any rhinorrhea. However she is in amended stress caring for her mother who has dementia. She is not sleeping at night due to her mother sundowning. She is wondering if the headache could possibly be tension and stress. Past Medical History  Diagnosis Date  . MVP (mitral valve prolapse)     palpitations  . Generalized headaches   . COPD (chronic obstructive pulmonary disease)   . Elevated lipids   . Osteopenia   . Hypothyroidism   . Arthritis   . History of pneumonia   . Hyperlipidemia   . Sinusitis    Current Outpatient Prescriptions on File Prior to Visit  Medication Sig Dispense Refill  . aspirin 81 MG tablet Take 81 mg by mouth daily.        . Cholecalciferol (VITAMIN D-3) 1000 UNITS CAPS Take 1 capsule by mouth daily.      Marland Kitchen dextromethorphan-guaiFENesin (MUCINEX DM) 30-600 MG per 12 hr tablet Take 1 tablet by mouth daily.      . fluticasone (FLONASE) 50 MCG/ACT nasal spray USE 2 SPRAYS EACH SIDE ONCE A DAY  48 g  3  . Fluticasone-Salmeterol (ADVAIR DISKUS) 250-50 MCG/DOSE AEPB Inhale 1 puff into the lungs every 12 (twelve) hours.  60 each  5  . levalbuterol (XOPENEX) 1.25 MG/3ML nebulizer solution Take 1.25 mg by nebulization every 8 (eight) hours as needed for wheezing.  72 mL  12  . levothyroxine (SYNTHROID) 50 MCG  tablet TAKE 1 TABLET (50 MCG TOTAL) BY MOUTH DAILY.  90 tablet  3  . meloxicam (MOBIC) 15 MG tablet Take 1 tablet (15 mg total) by mouth daily.  30 tablet  3  . naphazoline-glycerin (CLEAR EYES) 0.012-0.2 % SOLN Place 1-2 drops into both eyes every 4 (four) hours as needed.      . naproxen (NAPROSYN) 500 MG tablet Take 1 tablet (500 mg total) by mouth 2 (two) times daily with a meal.  60 tablet  3  . omeprazole (PRILOSEC) 20 MG capsule Take 20 mg by mouth daily.      Vladimir Faster Glycol-Propyl Glycol (SYSTANE) 0.4-0.3 % SOLN Apply to eye.      . simvastatin (ZOCOR) 40 MG tablet TAKE 1 TABLET (40 MG TOTAL) BY MOUTH AT BEDTIME.  90 tablet  3  . traMADol (ULTRAM) 50 MG tablet Take 1-2 every 6 hours as needed  45 tablet  1  . zolpidem (AMBIEN) 5 MG tablet TAKE 1 TABLET BY MOUTH AS NEEDED AT BEDTIME  30 tablet  2   Current Facility-Administered Medications on File Prior to Visit  Medication Dose Route Frequency Provider Last Rate Last Dose  . 0.9 %  sodium chloride infusion  500 mL Intravenous Continuous Inda Castle, MD       No Known Allergies History   Social History  .  Marital Status: Widowed    Spouse Name: N/A    Number of Children: 0  . Years of Education: N/A   Occupational History  .     Social History Main Topics  . Smoking status: Former Smoker    Quit date: 07/24/2005  . Smokeless tobacco: Never Used  . Alcohol Use: No  . Drug Use: No  . Sexual Activity:    Other Topics Concern  . Not on file   Social History Narrative  . No narrative on file      Review of Systems  All other systems reviewed and are negative.      Objective:   Physical Exam  Vitals reviewed. Constitutional: She is oriented to person, place, and time. She appears well-developed and well-nourished.  HENT:  Right Ear: Tympanic membrane, external ear and ear canal normal.  Left Ear: Tympanic membrane, external ear and ear canal normal.  Nose: No mucosal edema or rhinorrhea. Right sinus  exhibits frontal sinus tenderness. Left sinus exhibits maxillary sinus tenderness.  Mouth/Throat: Oropharynx is clear and moist. No oropharyngeal exudate.  Cardiovascular: Normal rate, regular rhythm and normal heart sounds.   No murmur heard. Pulmonary/Chest: Effort normal and breath sounds normal.  Neurological: She is alert and oriented to person, place, and time. She has normal reflexes. She displays normal reflexes. No cranial nerve deficit. She exhibits normal muscle tone. Coordination normal.          Assessment & Plan:  Acute rhinosinusitis - Plan: amoxicillin (AMOXIL) 875 MG tablet  Tension headache - Plan: diazepam (VALIUM) 5 MG tablet  I believe the majority patient's pain is likely tension headache. I believe that would explain a fluctuation in her blood pressure because she's under tremendous stress. It would also explained a constant dull headache. However she is also having tenderness to palpation and percussion over her sinuses. She is also reporting congestion and allergies. Therefore I believe she may be having an element of rhinosinusitis along with tension headaches. I have recommended amoxicillin 875 mg by mouth twice a day for 10 days for sinusitis. Also gave the patient Valium 5 mg one by mouth every 12 hours as needed for anxiety, tension headache, or insomnia.  She is to call me next week if the headaches are no better

## 2013-05-30 DIAGNOSIS — H40003 Preglaucoma, unspecified, bilateral: Secondary | ICD-10-CM | POA: Insufficient documentation

## 2013-05-30 DIAGNOSIS — H2510 Age-related nuclear cataract, unspecified eye: Secondary | ICD-10-CM | POA: Insufficient documentation

## 2013-07-09 ENCOUNTER — Telehealth: Payer: Self-pay | Admitting: Family Medicine

## 2013-07-09 MED ORDER — BUDESONIDE-FORMOTEROL FUMARATE 160-4.5 MCG/ACT IN AERO: 2.0000 | INHALATION_SPRAY | Freq: Two times a day (BID) | RESPIRATORY_TRACT | Status: DC

## 2013-07-09 NOTE — Telephone Encounter (Signed)
[phone]  Pt is needing a refill on symbcort 160/4.5 She had been getting samples  Pharmacy [facility]

## 2013-07-09 NOTE — Telephone Encounter (Signed)
Rx Refilled  

## 2013-07-28 ENCOUNTER — Encounter: Payer: Self-pay | Admitting: Family Medicine

## 2013-08-18 ENCOUNTER — Ambulatory Visit (INDEPENDENT_AMBULATORY_CARE_PROVIDER_SITE_OTHER): Payer: Medicare HMO | Admitting: Family Medicine

## 2013-08-18 ENCOUNTER — Encounter: Payer: Self-pay | Admitting: Family Medicine

## 2013-08-18 VITALS — BP 128/84 | HR 80 | Temp 98.5°F | Resp 18 | Ht 65.5 in | Wt 184.0 lb

## 2013-08-18 DIAGNOSIS — E785 Hyperlipidemia, unspecified: Secondary | ICD-10-CM

## 2013-08-18 DIAGNOSIS — R0989 Other specified symptoms and signs involving the circulatory and respiratory systems: Secondary | ICD-10-CM

## 2013-08-18 DIAGNOSIS — Z79899 Other long term (current) drug therapy: Secondary | ICD-10-CM

## 2013-08-18 DIAGNOSIS — R0609 Other forms of dyspnea: Secondary | ICD-10-CM

## 2013-08-18 DIAGNOSIS — R0600 Dyspnea, unspecified: Secondary | ICD-10-CM

## 2013-08-18 DIAGNOSIS — E039 Hypothyroidism, unspecified: Secondary | ICD-10-CM

## 2013-08-18 LAB — CBC WITH DIFFERENTIAL/PLATELET
Basophils Absolute: 0 10*3/uL (ref 0.0–0.1)
Basophils Relative: 0 % (ref 0–1)
EOS ABS: 0.4 10*3/uL (ref 0.0–0.7)
Eosinophils Relative: 5 % (ref 0–5)
HCT: 39.7 % (ref 36.0–46.0)
HEMOGLOBIN: 13.7 g/dL (ref 12.0–15.0)
LYMPHS ABS: 2.8 10*3/uL (ref 0.7–4.0)
LYMPHS PCT: 37 % (ref 12–46)
MCH: 28.5 pg (ref 26.0–34.0)
MCHC: 34.5 g/dL (ref 30.0–36.0)
MCV: 82.5 fL (ref 78.0–100.0)
MONOS PCT: 8 % (ref 3–12)
Monocytes Absolute: 0.6 10*3/uL (ref 0.1–1.0)
NEUTROS ABS: 3.8 10*3/uL (ref 1.7–7.7)
NEUTROS PCT: 50 % (ref 43–77)
Platelets: 355 10*3/uL (ref 150–400)
RBC: 4.81 MIL/uL (ref 3.87–5.11)
RDW: 14 % (ref 11.5–15.5)
WBC: 7.6 10*3/uL (ref 4.0–10.5)

## 2013-08-18 LAB — COMPLETE METABOLIC PANEL WITH GFR
ALBUMIN: 3.9 g/dL (ref 3.5–5.2)
ALT: 21 U/L (ref 0–35)
AST: 23 U/L (ref 0–37)
Alkaline Phosphatase: 54 U/L (ref 39–117)
BUN: 14 mg/dL (ref 6–23)
CALCIUM: 9.3 mg/dL (ref 8.4–10.5)
CHLORIDE: 103 meq/L (ref 96–112)
CO2: 26 mEq/L (ref 19–32)
Creat: 0.87 mg/dL (ref 0.50–1.10)
GFR, Est African American: 79 mL/min
GFR, Est Non African American: 69 mL/min
GLUCOSE: 104 mg/dL — AB (ref 70–99)
POTASSIUM: 4.6 meq/L (ref 3.5–5.3)
Sodium: 137 mEq/L (ref 135–145)
Total Bilirubin: 0.6 mg/dL (ref 0.2–1.2)
Total Protein: 7.2 g/dL (ref 6.0–8.3)

## 2013-08-18 LAB — LIPID PANEL
CHOLESTEROL: 166 mg/dL (ref 0–200)
HDL: 42 mg/dL (ref >39)
LDL Cholesterol: 85 mg/dL (ref 0–99)
Total CHOL/HDL Ratio: 4 Ratio
Triglycerides: 195 mg/dL — ABNORMAL HIGH (ref <150)
VLDL: 39 mg/dL (ref 0–40)

## 2013-08-18 LAB — TSH: TSH: 2.552 u[IU]/mL (ref 0.350–4.500)

## 2013-08-18 NOTE — Progress Notes (Signed)
Subjective:    Patient ID: Mary Blevins, female    DOB: 11/27/1945, 68 y.o.   MRN: [id number]  HPI  Patient has a history of COPD. She is currently managed with Symbicort 160/4.5, 2 puffs inhaled twice a day. Typically this works well. However during this hot muggy summer, she reports increased dyspnea on exertion. She denies any chest pain. She does not have bleeding issues if she remains indoors. However she frequently goes outdoors to perform chores around the home and she finds herself struggling to breathe. She is using albuterol nebulizers on a daily basis to help with this. She also occasionally uses her mother's oxygen. She denies any hemoptysis or cough. She denies any fevers or chills. She denies any melena or hematochezia. She also has a history of hyperlipidemia and hypothyroidism. She is overdue for lab work for this. Past Medical History  Diagnosis Date  . MVP (mitral valve prolapse)     palpitations  . Generalized headaches   . COPD (chronic obstructive pulmonary disease)   . Elevated lipids   . Osteopenia   . Hypothyroidism   . Arthritis   . History of pneumonia   . Hyperlipidemia   . Sinusitis    Past Surgical History  Procedure Laterality Date  . Thyroidectomy    . Dilation and curettage of uterus    . Tonsillectomy     Current Outpatient Prescriptions on File Prior to Visit  Medication Sig Dispense Refill  . aspirin 81 MG tablet Take 81 mg by mouth daily.        . budesonide-formoterol (SYMBICORT) 160-4.5 MCG/ACT inhaler Inhale 2 puffs into the lungs 2 (two) times daily.  1 Inhaler  11  . Cholecalciferol (VITAMIN D-3) 1000 UNITS CAPS Take 1 capsule by mouth daily.      Marland Kitchen dextromethorphan-guaiFENesin (MUCINEX DM) 30-600 MG per 12 hr tablet Take 1 tablet by mouth daily.      . diazepam (VALIUM) 5 MG tablet Take 1 tablet (5 mg total) by mouth every 12 (twelve) hours as needed for anxiety.  30 tablet  1  . fluticasone (FLONASE) 50 MCG/ACT nasal spray USE 2  SPRAYS EACH SIDE ONCE A DAY  48 g  3  . levalbuterol (XOPENEX) 1.25 MG/3ML nebulizer solution Take 1.25 mg by nebulization every 8 (eight) hours as needed for wheezing.  72 mL  12  . levothyroxine (SYNTHROID) 50 MCG tablet TAKE 1 TABLET (50 MCG TOTAL) BY MOUTH DAILY.  90 tablet  3  . meloxicam (MOBIC) 15 MG tablet Take 1 tablet (15 mg total) by mouth daily.  30 tablet  3  . naphazoline-glycerin (CLEAR EYES) 0.012-0.2 % SOLN Place 1-2 drops into both eyes every 4 (four) hours as needed.      . naproxen (NAPROSYN) 500 MG tablet Take 1 tablet (500 mg total) by mouth 2 (two) times daily with a meal.  60 tablet  3  . omeprazole (PRILOSEC) 20 MG capsule Take 20 mg by mouth daily.      Vladimir Faster Glycol-Propyl Glycol (SYSTANE) 0.4-0.3 % SOLN Apply to eye.      . simvastatin (ZOCOR) 40 MG tablet TAKE 1 TABLET (40 MG TOTAL) BY MOUTH AT BEDTIME.  90 tablet  3  . traMADol (ULTRAM) 50 MG tablet Take 1-2 every 6 hours as needed  45 tablet  1  . zolpidem (AMBIEN) 5 MG tablet TAKE 1 TABLET BY MOUTH AS NEEDED AT BEDTIME  30 tablet  2   Current Facility-Administered Medications  on File Prior to Visit  Medication Dose Route Frequency Provider Last Rate Last Dose  . 0.9 %  sodium chloride infusion  500 mL Intravenous Continuous Inda Castle, MD       No Known Allergies History   Social History  . Marital Status: Widowed    Spouse Name: N/A    Number of Children: 0  . Years of Education: N/A   Occupational History  .     Social History Main Topics  . Smoking status: Former Smoker    Quit date: 07/24/2005  . Smokeless tobacco: Never Used  . Alcohol Use: No  . Drug Use: No  . Sexual Activity:    Other Topics Concern  . Not on file   Social History Narrative  . No narrative on file     Review of Systems  All other systems reviewed and are negative.      Objective:   Physical Exam  Vitals reviewed. Neck: No JVD present. No thyromegaly present.  Cardiovascular: Normal rate, regular  rhythm and normal heart sounds.   No murmur heard. Pulmonary/Chest: Effort normal. No respiratory distress. She has decreased breath sounds. She has no wheezes. She has no rales.  Abdominal: Soft. Bowel sounds are normal. She exhibits no distension. There is no tenderness. There is no rebound and no guarding.  Musculoskeletal: She exhibits no edema.          Assessment & Plan:  1. HLD (hyperlipidemia) Check fasting lipid panel. LDL is less than 130. - COMPLETE METABOLIC PANEL WITH GFR - Lipid panel  2. Unspecified hypothyroidism - TSH - TSH  3. Dyspnea I believe this is due to her COPD. There is no symptoms suggestive of ischemic cardiomyopathy. I will discontinue Symbicort. I replaced it with stiolto 1 inh BID and recheck in 2 weeks. Also check CBC to rule out anemia. - CBC with Differential

## 2013-08-19 ENCOUNTER — Encounter: Payer: Self-pay | Admitting: Family Medicine

## 2013-08-21 ENCOUNTER — Telehealth: Payer: Self-pay | Admitting: Family Medicine

## 2013-08-21 NOTE — Telephone Encounter (Signed)
Patient was put on an inhaler and she says she is itching a lot now? Wants to know if she should still be taking it or not  Please call her back at [phone]

## 2013-08-22 NOTE — Telephone Encounter (Signed)
Pt aware and did switch back to symbicort last night and the itching is much better.

## 2013-08-22 NOTE — Telephone Encounter (Signed)
I do not believe the inhaler is causing any itching.  Does she have a rash?  She could certainly try stopping stiolto and going back on symbicort.

## 2013-10-17 ENCOUNTER — Telehealth: Payer: Self-pay | Admitting: Family Medicine

## 2013-10-17 NOTE — Telephone Encounter (Signed)
Patient would like for [place] to document in her chart that she had her flu shot at [facility] would like to make sure this in in her history

## 2013-10-20 NOTE — Telephone Encounter (Signed)
Chart updated

## 2013-12-15 ENCOUNTER — Ambulatory Visit (INDEPENDENT_AMBULATORY_CARE_PROVIDER_SITE_OTHER): Payer: Medicare HMO | Admitting: Family Medicine

## 2013-12-15 ENCOUNTER — Encounter: Payer: Self-pay | Admitting: Family Medicine

## 2013-12-15 VITALS — BP 110/72 | HR 78 | Temp 98.8°F | Resp 18 | Ht 65.5 in | Wt 188.0 lb

## 2013-12-15 DIAGNOSIS — J208 Acute bronchitis due to other specified organisms: Secondary | ICD-10-CM

## 2013-12-15 MED ORDER — AZITHROMYCIN 250 MG PO TABS: ORAL_TABLET | ORAL | Status: DC

## 2013-12-15 MED ORDER — HYDROCODONE-HOMATROPINE 5-1.5 MG/5ML PO SYRP: 5.0000 mL | ORAL_SOLUTION | Freq: Three times a day (TID) | ORAL | Status: DC | PRN

## 2013-12-15 NOTE — Progress Notes (Signed)
Subjective:    Patient ID: Mary Blevins, female    DOB: Apr 19, 1945, 68 y.o.   MRN: [id number]  HPI Patient has a history of COPD. 2 days ago she developed a cough productive of clear mucus, laryngitis, rhinorrhea, postnasal drip, head and chest congestion. She denies any purulent sputum. She denies any chest pain or shortness of breath. She denies any fevers or chills. Past Medical History  Diagnosis Date  . MVP (mitral valve prolapse)     palpitations  . Generalized headaches   . COPD (chronic obstructive pulmonary disease)   . Elevated lipids   . Osteopenia   . Hypothyroidism   . Arthritis   . History of pneumonia   . Hyperlipidemia   . Sinusitis    Past Surgical History  Procedure Laterality Date  . Thyroidectomy    . Dilation and curettage of uterus    . Tonsillectomy     Current Outpatient Prescriptions on File Prior to Visit  Medication Sig Dispense Refill  . aspirin 81 MG tablet Take 81 mg by mouth daily.      . budesonide-formoterol (SYMBICORT) 160-4.5 MCG/ACT inhaler Inhale 2 puffs into the lungs 2 (two) times daily. 1 Inhaler 11  . Cholecalciferol (VITAMIN D-3) 1000 UNITS CAPS Take 1 capsule by mouth daily.    Marland Kitchen dextromethorphan-guaiFENesin (MUCINEX DM) 30-600 MG per 12 hr tablet Take 1 tablet by mouth daily.    . diazepam (VALIUM) 5 MG tablet Take 1 tablet (5 mg total) by mouth every 12 (twelve) hours as needed for anxiety. 30 tablet 1  . fluticasone (FLONASE) 50 MCG/ACT nasal spray USE 2 SPRAYS EACH SIDE ONCE A DAY 48 g 3  . levalbuterol (XOPENEX) 1.25 MG/3ML nebulizer solution Take 1.25 mg by nebulization every 8 (eight) hours as needed for wheezing. 72 mL 12  . levothyroxine (SYNTHROID) 50 MCG tablet TAKE 1 TABLET (50 MCG TOTAL) BY MOUTH DAILY. 90 tablet 3  . meloxicam (MOBIC) 15 MG tablet Take 1 tablet (15 mg total) by mouth daily. 30 tablet 3  . naphazoline-glycerin (CLEAR EYES) 0.012-0.2 % SOLN Place 1-2 drops into both eyes every 4 (four) hours as  needed.    . naproxen (NAPROSYN) 500 MG tablet Take 1 tablet (500 mg total) by mouth 2 (two) times daily with a meal. 60 tablet 3  . omeprazole (PRILOSEC) 20 MG capsule Take 20 mg by mouth daily.    Vladimir Faster Glycol-Propyl Glycol (SYSTANE) 0.4-0.3 % SOLN Apply to eye.    . simvastatin (ZOCOR) 40 MG tablet TAKE 1 TABLET (40 MG TOTAL) BY MOUTH AT BEDTIME. 90 tablet 3  . traMADol (ULTRAM) 50 MG tablet Take 1-2 every 6 hours as needed 45 tablet 1  . zolpidem (AMBIEN) 5 MG tablet TAKE 1 TABLET BY MOUTH AS NEEDED AT BEDTIME 30 tablet 2   Current Facility-Administered Medications on File Prior to Visit  Medication Dose Route Frequency Provider Last Rate Last Dose  . 0.9 %  sodium chloride infusion  500 mL Intravenous Continuous Inda Castle, MD       No Known Allergies History   Social History  . Marital Status: Widowed    Spouse Name: N/A    Number of Children: 0  . Years of Education: N/A   Occupational History  .     Social History Main Topics  . Smoking status: Former Smoker    Quit date: 07/24/2005  . Smokeless tobacco: Never Used  . Alcohol Use: No  . Drug Use:  No  . Sexual Activity: Not on file   Other Topics Concern  . Not on file   Social History Narrative      Review of Systems  All other systems reviewed and are negative.      Objective:   Physical Exam  HENT:  Right Ear: External ear normal.  Left Ear: External ear normal.  Nose: Nose normal.  Mouth/Throat: Oropharynx is clear and moist. No oropharyngeal exudate.  Eyes: Conjunctivae are normal.  Neck: Neck supple. No JVD present.  Cardiovascular: Normal rate, regular rhythm and normal heart sounds.   Pulmonary/Chest: No accessory muscle usage. No respiratory distress. She has decreased breath sounds. She has no wheezes. She has no rhonchi. She has no rales.  Lymphadenopathy:    She has no cervical adenopathy.  Vitals reviewed.         Assessment & Plan:  Acute bronchitis due to other  specified organisms - Plan: HYDROcodone-homatropine (HYCODAN) 5-1.5 MG/5ML syrup, azithromycin (ZITHROMAX) 250 MG tablet  Patient symptoms are consistent with a viral upper respiratory infection including laryngitis and bronchitis. I recommended tincture of time. I gave the patient prescription for Hycodan 1 teaspoon every 8 hours as needed for cough. She can use Mucinex DM for cough. She can use Sudafed for congestion. I did give the patient in for a Z-Pak due to the holiday weekend but instructed her not to fill the prescription unless she develops a high fever, cough productive of green purulent sputum, chest pain, or shortness of breath.

## 2014-01-05 ENCOUNTER — Telehealth: Payer: Self-pay | Admitting: Family Medicine

## 2014-01-05 NOTE — Telephone Encounter (Signed)
Patient is calling saying she feels sluggish and says that her blood pressure is a little high, would like to know if maybe she needs some iron pills but would like a call back from you  [phone]

## 2014-01-07 NOTE — Telephone Encounter (Signed)
Pt called back and states she is feeling better now and believes that her sluggish was d/t just walkning and what rose her BP up

## 2014-01-07 NOTE — Telephone Encounter (Signed)
lmtrc

## 2014-03-11 ENCOUNTER — Telehealth: Payer: Self-pay | Admitting: Family Medicine

## 2014-03-11 NOTE — Telephone Encounter (Signed)
Patient went to urgent care and her blood pressure was high, would like you to call her regarding this [phone]

## 2014-03-12 NOTE — Telephone Encounter (Signed)
Spoke to pt and pt states that her BP has come down and I explained that it was probably due to the fact that she was sick to keep a check on it and if it starts to increase again to call us back.

## 2014-03-24 ENCOUNTER — Telehealth: Payer: Self-pay | Admitting: Family Medicine

## 2014-03-24 NOTE — Telephone Encounter (Signed)
[phone]  Pt is wanting to be referred to ENT due to sinus issues Somewhere in [place]

## 2014-03-24 NOTE — Telephone Encounter (Signed)
?  ok to place referral to ENT, last ov for these symptoms 05/23/13

## 2014-03-26 NOTE — Telephone Encounter (Signed)
Pt called back and scheduled appt for march 17

## 2014-03-26 NOTE — Telephone Encounter (Signed)
lmtrc

## 2014-03-26 NOTE — Telephone Encounter (Signed)
NTBS to discuss what she means by issues and what has she tried.  May not need referral.

## 2014-04-07 ENCOUNTER — Other Ambulatory Visit: Payer: Self-pay | Admitting: Family Medicine

## 2014-04-09 ENCOUNTER — Encounter: Payer: Self-pay | Admitting: Family Medicine

## 2014-04-09 ENCOUNTER — Ambulatory Visit (INDEPENDENT_AMBULATORY_CARE_PROVIDER_SITE_OTHER): Payer: Medicare Other | Admitting: Family Medicine

## 2014-04-09 VITALS — BP 98/60 | HR 72 | Temp 98.6°F | Resp 18 | Ht 65.5 in | Wt 188.0 lb

## 2014-04-09 DIAGNOSIS — J328 Other chronic sinusitis: Secondary | ICD-10-CM

## 2014-04-09 MED ORDER — LEVOCETIRIZINE DIHYDROCHLORIDE 5 MG PO TABS: 5.0000 mg | ORAL_TABLET | Freq: Every evening | ORAL | Status: DC

## 2014-04-09 NOTE — Progress Notes (Signed)
Subjective:    Patient ID: Mary Blevins, female    DOB: 10/30/1945, 69 y.o.   MRN: [id number]  HPI  patient is wanting a referral to ENT. When I ask why, the patient states her vision is not as good as it used to be. She has seen 2 different specialist who state that there is no problem they can see her eyes. She does report that her eyes water frequently. She does have rhinorrhea and constant sinus congestion. The Flonase is helped I have given her but she continues to have watery eyes. She denies any itching or burning. She does have clear rhinorrhea. Allergies do seem to play a role in this. She denies any sinus pain. She denies any sinus pressure. She denies any sinus headache. She denies any fevers. Past Medical History  Diagnosis Date  . MVP (mitral valve prolapse)     palpitations  . Generalized headaches   . COPD (chronic obstructive pulmonary disease)   . Elevated lipids   . Osteopenia   . Hypothyroidism   . Arthritis   . History of pneumonia   . Hyperlipidemia   . Sinusitis    Past Surgical History  Procedure Laterality Date  . Thyroidectomy    . Dilation and curettage of uterus    . Tonsillectomy     Current Outpatient Prescriptions on File Prior to Visit  Medication Sig Dispense Refill  . aspirin 81 MG tablet Take 81 mg by mouth daily.      . budesonide-formoterol (SYMBICORT) 160-4.5 MCG/ACT inhaler Inhale 2 puffs into the lungs 2 (two) times daily. 1 Inhaler 11  . Cholecalciferol (VITAMIN D-3) 1000 UNITS CAPS Take 1 capsule by mouth daily.    Marland Kitchen dextromethorphan-guaiFENesin (MUCINEX DM) 30-600 MG per 12 hr tablet Take 1 tablet by mouth daily.    . diazepam (VALIUM) 5 MG tablet Take 1 tablet (5 mg total) by mouth every 12 (twelve) hours as needed for anxiety. 30 tablet 1  . fluticasone (FLONASE) 50 MCG/ACT nasal spray USE 2 SPRAYS EACH SIDE ONCE A DAY 48 g 3  . levalbuterol (XOPENEX) 1.25 MG/3ML nebulizer solution Take 1.25 mg by nebulization every 8 (eight)  hours as needed for wheezing. 72 mL 12  . meloxicam (MOBIC) 15 MG tablet Take 1 tablet (15 mg total) by mouth daily. 30 tablet 3  . naproxen (NAPROSYN) 500 MG tablet Take 1 tablet (500 mg total) by mouth 2 (two) times daily with a meal. 60 tablet 3  . omeprazole (PRILOSEC) 20 MG capsule Take 20 mg by mouth daily.    Vladimir Faster Glycol-Propyl Glycol (SYSTANE) 0.4-0.3 % SOLN Apply to eye.    . simvastatin (ZOCOR) 40 MG tablet TAKE 1 TABLET (40 MG TOTAL) BY MOUTH AT BEDTIME. 90 tablet 3  . SYNTHROID 50 MCG tablet TAKE 1 TABLET (50 MCG TOTAL) BY MOUTH DAILY. 90 tablet 3  . traMADol (ULTRAM) 50 MG tablet Take 1-2 every 6 hours as needed 45 tablet 1  . zolpidem (AMBIEN) 5 MG tablet TAKE 1 TABLET BY MOUTH AS NEEDED AT BEDTIME 30 tablet 2   Current Facility-Administered Medications on File Prior to Visit  Medication Dose Route Frequency Provider Last Rate Last Dose  . 0.9 %  sodium chloride infusion  500 mL Intravenous Continuous Inda Castle, MD       No Known Allergies History   Social History  . Marital Status: Widowed    Spouse Name: N/A  . Number of Children: 0  .  Years of Education: N/A   Occupational History  .     Social History Main Topics  . Smoking status: Former Smoker    Quit date: 07/24/2005  . Smokeless tobacco: Never Used  . Alcohol Use: No  . Drug Use: No  . Sexual Activity: Not on file   Other Topics Concern  . Not on file   Social History Narrative      Review of Systems  All other systems reviewed and are negative.      Objective:   Physical Exam  Constitutional: She appears well-developed and well-nourished.  HENT:  Right Ear: Tympanic membrane, external ear and ear canal normal.  Left Ear: Tympanic membrane, external ear and ear canal normal.  Nose: Mucosal edema and rhinorrhea present. Right sinus exhibits no maxillary sinus tenderness and no frontal sinus tenderness. Left sinus exhibits no maxillary sinus tenderness and no frontal sinus  tenderness.  Mouth/Throat: Oropharynx is clear and moist. No oropharyngeal exudate.  Cardiovascular: Normal rate and regular rhythm.   Pulmonary/Chest: Effort normal and breath sounds normal.  Lymphadenopathy:    She has no cervical adenopathy.  Vitals reviewed.         Assessment & Plan:  Other chronic sinusitis - Plan: levocetirizine (XYZAL) 5 MG tablet   Patient may have chronic sinusitis causing her rhinorrhea and her sinus congestion. I will add Xyzal 5 mg by mouth daily to her Flonase. At the present time though I do not see any evidence that her sinus irritation and rhinorrhea is contributing to her abnormal vision. I suggested we try this medication first for 2-3 weeks to see if her symptoms improved prior to a referral to an ear nose and throat physician.

## 2014-04-10 ENCOUNTER — Other Ambulatory Visit: Payer: Self-pay | Admitting: Family Medicine

## 2014-04-10 MED ORDER — LEVOTHYROXINE SODIUM 50 MCG PO TABS: 50.0000 ug | ORAL_TABLET | Freq: Every day | ORAL | Status: DC

## 2014-05-19 ENCOUNTER — Ambulatory Visit (INDEPENDENT_AMBULATORY_CARE_PROVIDER_SITE_OTHER): Payer: Medicare Other | Admitting: Family Medicine

## 2014-05-19 ENCOUNTER — Encounter: Payer: Self-pay | Admitting: Family Medicine

## 2014-05-19 VITALS — BP 140/78 | HR 78 | Temp 98.6°F | Resp 18 | Ht 65.5 in | Wt 187.0 lb

## 2014-05-19 DIAGNOSIS — M545 Low back pain, unspecified: Secondary | ICD-10-CM

## 2014-05-19 MED ORDER — FLUTICASONE PROPIONATE 50 MCG/ACT NA SUSP: NASAL | Status: DC

## 2014-05-19 MED ORDER — NAPROXEN 500 MG PO TABS: 500.0000 mg | ORAL_TABLET | Freq: Two times a day (BID) | ORAL | Status: DC

## 2014-05-19 MED ORDER — TIZANIDINE HCL 4 MG PO TABS: 4.0000 mg | ORAL_TABLET | Freq: Four times a day (QID) | ORAL | Status: DC | PRN

## 2014-05-19 NOTE — Progress Notes (Signed)
Subjective:    Patient ID: Mary Blevins, female    DOB: 14-Feb-1945, 69 y.o.   MRN: [id number]  HPI Patient is a very pleasant 69 year old African-American female who reports 3-4 days of worsening left lower back pain. She is extremely tender to palpation in the lower left flank just above her pelvis. She has a palpable muscle spasm in this area. The muscle spasm is extremely tender to palpation. She also reports decreasing range of motion due to pain and stiffness. She denies sciatica. She denies numbness or weakness in the legs. She denies any falls or injuries. She denies any dysuria or hematuria. Past Medical History  Diagnosis Date  . MVP (mitral valve prolapse)     palpitations  . Generalized headaches   . COPD (chronic obstructive pulmonary disease)   . Elevated lipids   . Osteopenia   . Hypothyroidism   . Arthritis   . History of pneumonia   . Hyperlipidemia   . Sinusitis    Past Surgical History  Procedure Laterality Date  . Thyroidectomy    . Dilation and curettage of uterus    . Tonsillectomy     Current Outpatient Prescriptions on File Prior to Visit  Medication Sig Dispense Refill  . aspirin 81 MG tablet Take 81 mg by mouth daily.      . budesonide-formoterol (SYMBICORT) 160-4.5 MCG/ACT inhaler Inhale 2 puffs into the lungs 2 (two) times daily. 1 Inhaler 11  . Cholecalciferol (VITAMIN D-3) 1000 UNITS CAPS Take 1 capsule by mouth daily.    Marland Kitchen dextromethorphan-guaiFENesin (MUCINEX DM) 30-600 MG per 12 hr tablet Take 1 tablet by mouth daily.    Marland Kitchen levalbuterol (XOPENEX) 1.25 MG/3ML nebulizer solution Take 1.25 mg by nebulization every 8 (eight) hours as needed for wheezing. 72 mL 12  . levocetirizine (XYZAL) 5 MG tablet Take 1 tablet (5 mg total) by mouth every evening. 30 tablet 3  . levothyroxine (SYNTHROID, LEVOTHROID) 50 MCG tablet Take 1 tablet (50 mcg total) by mouth daily before breakfast. 30 tablet 11  . omeprazole (PRILOSEC) 20 MG capsule Take 20 mg by  mouth daily.    Vladimir Faster Glycol-Propyl Glycol (SYSTANE) 0.4-0.3 % SOLN Apply to eye.    . simvastatin (ZOCOR) 40 MG tablet TAKE 1 TABLET (40 MG TOTAL) BY MOUTH AT BEDTIME. 90 tablet 3  . traMADol (ULTRAM) 50 MG tablet Take 1-2 every 6 hours as needed 45 tablet 1  . zolpidem (AMBIEN) 5 MG tablet TAKE 1 TABLET BY MOUTH AS NEEDED AT BEDTIME 30 tablet 2   Current Facility-Administered Medications on File Prior to Visit  Medication Dose Route Frequency Provider Last Rate Last Dose  . 0.9 %  sodium chloride infusion  500 mL Intravenous Continuous Inda Castle, MD       No Known Allergies History   Social History  . Marital Status: Widowed    Spouse Name: N/A  . Number of Children: 0  . Years of Education: N/A   Occupational History  .     Social History Main Topics  . Smoking status: Former Smoker    Quit date: 07/24/2005  . Smokeless tobacco: Never Used  . Alcohol Use: No  . Drug Use: No  . Sexual Activity: Not on file   Other Topics Concern  . Not on file   Social History Narrative      Review of Systems  All other systems reviewed and are negative.      Objective:   Physical Exam  Constitutional: She is oriented to person, place, and time.  Cardiovascular: Normal rate, regular rhythm and normal heart sounds.   Pulmonary/Chest: Effort normal and breath sounds normal. No respiratory distress. She has no wheezes.  Musculoskeletal:       Lumbar back: She exhibits decreased range of motion, tenderness, pain and spasm. She exhibits no bony tenderness.  Neurological: She is alert and oriented to person, place, and time. She has normal reflexes. She displays normal reflexes. No cranial nerve deficit. She exhibits normal muscle tone. Coordination normal.  Vitals reviewed.         Assessment & Plan:  Midline low back pain without sciatica - Plan: naproxen (NAPROSYN) 500 MG tablet, tiZANidine (ZANAFLEX) 4 MG tablet  Patient has low back pain secondary to a pulled  muscle in her left lower back. I believe she strained his muscle. I anticipate gradual spontaneous improvement over the next 2 weeks. I recommended tizanidine 4 mg every 6 hours as needed for muscle spasms and pain. She can also use naproxen 500 mg by mouth twice a day as needed for pain and inflammation. I recommended range of motion exercises at home. Also recommended moist heat. Recheck in 2 weeks if no better or sooner if worse

## 2014-05-20 ENCOUNTER — Telehealth: Payer: Self-pay | Admitting: Family Medicine

## 2014-05-20 NOTE — Telephone Encounter (Signed)
PA submitted through [email or web address] [facility] Medicare)

## 2014-05-22 NOTE — Telephone Encounter (Signed)
Received call from [facility] and PA needs to be for non-formulary and not tier exception. Another PA submitted through [email or web address]

## 2014-05-25 NOTE — Telephone Encounter (Signed)
Received call from [place] at [facility] stating she received a PA on Symbicort and did not understand why one was done on this medication. Pt is on a quanity exception program and is allowed to get one prescription a month d/t her formulary is allowing one per month, pt should call her pharmacy and have it filled.  IF you still have questions about the symbicort please call Almyra Free at [phone] direct.

## 2014-05-25 NOTE — Telephone Encounter (Signed)
Called and spoke to [place]  - do not know what has happened with this as they are not sure either - however it looks like she can get it filled at the pharmacy and if any other problems I will call Almyra Free back.

## 2014-05-28 ENCOUNTER — Other Ambulatory Visit: Payer: Self-pay | Admitting: Family Medicine

## 2014-06-11 ENCOUNTER — Ambulatory Visit (INDEPENDENT_AMBULATORY_CARE_PROVIDER_SITE_OTHER): Payer: Medicare Other | Admitting: Family Medicine

## 2014-06-11 ENCOUNTER — Encounter: Payer: Self-pay | Admitting: Family Medicine

## 2014-06-11 VITALS — BP 110/76 | HR 80 | Temp 98.3°F | Resp 18 | Ht 65.5 in | Wt 186.0 lb

## 2014-06-11 DIAGNOSIS — M79641 Pain in right hand: Secondary | ICD-10-CM

## 2014-06-11 MED ORDER — DICLOFENAC SODIUM 75 MG PO TBEC: 75.0000 mg | DELAYED_RELEASE_TABLET | Freq: Two times a day (BID) | ORAL | Status: DC | PRN

## 2014-06-11 NOTE — Progress Notes (Signed)
Subjective:    Patient ID: Mary Blevins, female    DOB: [birth year], 69 y.o.   MRN: [id number]  HPI  Patient presents with several days of pain in her right hand located at the MP joint on her second digit. The joint there is slightly swollen. It is tender to the touch. She denies any injury to that area. She denies any activities recently that could've aggravated that area. She denies any falls. There is no erythema. There is no warmth of the joint. It is slightly swollen. There is no palpable crepitus in the joint with range of motion although she does report pain with range of motion in the joint. There is no visible deformity in the joint. She has not been taking Naprosyn for this. She states the Naprosyn makes her sleepy. Past Medical History  Diagnosis Date  . MVP (mitral valve prolapse)     palpitations  . Generalized headaches   . COPD (chronic obstructive pulmonary disease)   . Elevated lipids   . Osteopenia   . Hypothyroidism   . Arthritis   . History of pneumonia   . Hyperlipidemia   . Sinusitis    Past Surgical History  Procedure Laterality Date  . Thyroidectomy    . Dilation and curettage of uterus    . Tonsillectomy     Current Outpatient Prescriptions on File Prior to Visit  Medication Sig Dispense Refill  . aspirin 81 MG tablet Take 81 mg by mouth daily.      . budesonide-formoterol (SYMBICORT) 160-4.5 MCG/ACT inhaler Inhale 2 puffs into the lungs 2 (two) times daily. 1 Inhaler 11  . Cholecalciferol (VITAMIN D-3) 1000 UNITS CAPS Take 1 capsule by mouth daily.    Marland Kitchen dextromethorphan-guaiFENesin (MUCINEX DM) 30-600 MG per 12 hr tablet Take 1 tablet by mouth daily.    . fluticasone (FLONASE) 50 MCG/ACT nasal spray USE 2 SPRAYS EACH SIDE ONCE A DAY 48 g 11  . levalbuterol (XOPENEX) 1.25 MG/3ML nebulizer solution Take 1.25 mg by nebulization every 8 (eight) hours as needed for wheezing. 72 mL 12  . levocetirizine (XYZAL) 5 MG tablet Take 1 tablet (5 mg total) by  mouth every evening. 30 tablet 3  . levothyroxine (SYNTHROID, LEVOTHROID) 50 MCG tablet Take 1 tablet (50 mcg total) by mouth daily before breakfast. 30 tablet 11  . naproxen (NAPROSYN) 500 MG tablet Take 1 tablet (500 mg total) by mouth 2 (two) times daily with a meal. 60 tablet 3  . omeprazole (PRILOSEC) 20 MG capsule Take 20 mg by mouth daily.    Vladimir Faster Glycol-Propyl Glycol (SYSTANE) 0.4-0.3 % SOLN Apply to eye.    . simvastatin (ZOCOR) 40 MG tablet TAKE 1 TABLET (40 MG TOTAL) BY MOUTH AT BEDTIME. 90 tablet 3  . tiZANidine (ZANAFLEX) 4 MG tablet Take 1 tablet (4 mg total) by mouth every 6 (six) hours as needed for muscle spasms. 30 tablet 0  . traMADol (ULTRAM) 50 MG tablet Take 1-2 every 6 hours as needed 45 tablet 1  . zolpidem (AMBIEN) 5 MG tablet TAKE 1 TABLET BY MOUTH AS NEEDED AT BEDTIME 30 tablet 2   Current Facility-Administered Medications on File Prior to Visit  Medication Dose Route Frequency Provider Last Rate Last Dose  . 0.9 %  sodium chloride infusion  500 mL Intravenous Continuous Inda Castle, MD       No Known Allergies History   Social History  . Marital Status: Widowed    Spouse Name: N/A  .  Number of Children: 0  . Years of Education: N/A   Occupational History  .     Social History Main Topics  . Smoking status: Former Smoker    Quit date: 07/24/2005  . Smokeless tobacco: Never Used  . Alcohol Use: No  . Drug Use: No  . Sexual Activity: Not on file   Other Topics Concern  . Not on file   Social History Narrative      Review of Systems  All other systems reviewed and are negative.      Objective:   Physical Exam  Cardiovascular: Normal rate and regular rhythm.   Pulmonary/Chest: Effort normal and breath sounds normal.  Musculoskeletal:       Right hand: She exhibits decreased range of motion, tenderness, bony tenderness and swelling. Normal sensation noted. Normal strength noted.  Vitals reviewed.         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 Begin diclofenac 75 mg by mouth twice a day. If symptoms are not improving by Monday, I would proceed with an x-ray of the hand. Once symptoms improve over the patient to discontinue diclofenac. I warned the patient about combining diclofenac and Naprosyn

## 2014-07-04 ENCOUNTER — Other Ambulatory Visit: Payer: Self-pay | Admitting: Family Medicine

## 2014-07-13 ENCOUNTER — Ambulatory Visit (INDEPENDENT_AMBULATORY_CARE_PROVIDER_SITE_OTHER): Payer: Medicare Other | Admitting: Family Medicine

## 2014-07-13 ENCOUNTER — Encounter: Payer: Self-pay | Admitting: Family Medicine

## 2014-07-13 VITALS — BP 118/74 | HR 76 | Temp 98.6°F | Resp 18 | Ht 65.5 in | Wt 187.0 lb

## 2014-07-13 DIAGNOSIS — J208 Acute bronchitis due to other specified organisms: Secondary | ICD-10-CM

## 2014-07-13 MED ORDER — ALBUTEROL SULFATE HFA 108 (90 BASE) MCG/ACT IN AERS: 2.0000 | INHALATION_SPRAY | Freq: Four times a day (QID) | RESPIRATORY_TRACT | Status: DC | PRN

## 2014-07-13 MED ORDER — AZITHROMYCIN 250 MG PO TABS: ORAL_TABLET | ORAL | Status: DC

## 2014-07-13 MED ORDER — HYDROCODONE-HOMATROPINE 5-1.5 MG/5ML PO SYRP: 5.0000 mL | ORAL_SOLUTION | Freq: Three times a day (TID) | ORAL | Status: DC | PRN

## 2014-07-13 NOTE — Progress Notes (Signed)
Subjective:    Patient ID: Mary Blevins, female    DOB: 04/17/1945, 69 y.o.   MRN: [id number]  HPI Patient has had a cough for 1 week. The cough is productive of green and yellow sputum. She also reports some wheezing and some shortness of breath. She denies any fevers or chills. She has a history of COPD and bronchitis. There has been a change in her sputum production. She also is reporting a more difficult time breathing. She denies any rhinorrhea or other symptoms of allergies Past Medical History  Diagnosis Date  . MVP (mitral valve prolapse)     palpitations  . Generalized headaches   . COPD (chronic obstructive pulmonary disease)   . Elevated lipids   . Osteopenia   . Hypothyroidism   . Arthritis   . History of pneumonia   . Hyperlipidemia   . Sinusitis    Past Surgical History  Procedure Laterality Date  . Thyroidectomy    . Dilation and curettage of uterus    . Tonsillectomy     Current Outpatient Prescriptions on File Prior to Visit  Medication Sig Dispense Refill  . aspirin 81 MG tablet Take 81 mg by mouth daily.      . Cholecalciferol (VITAMIN D-3) 1000 UNITS CAPS Take 1 capsule by mouth daily.    Marland Kitchen dextromethorphan-guaiFENesin (MUCINEX DM) 30-600 MG per 12 hr tablet Take 1 tablet by mouth daily.    . diclofenac (VOLTAREN) 75 MG EC tablet Take 1 tablet (75 mg total) by mouth 2 (two) times daily between meals as needed. 60 tablet 0  . fluticasone (FLONASE) 50 MCG/ACT nasal spray USE 2 SPRAYS EACH SIDE ONCE A DAY 48 g 11  . levalbuterol (XOPENEX) 1.25 MG/3ML nebulizer solution Take 1.25 mg by nebulization every 8 (eight) hours as needed for wheezing. 72 mL 12  . levocetirizine (XYZAL) 5 MG tablet Take 1 tablet (5 mg total) by mouth every evening. 30 tablet 3  . levothyroxine (SYNTHROID, LEVOTHROID) 50 MCG tablet Take 1 tablet (50 mcg total) by mouth daily before breakfast. 30 tablet 11  . naproxen (NAPROSYN) 500 MG tablet Take 1 tablet (500 mg total) by mouth 2  (two) times daily with a meal. 60 tablet 3  . omeprazole (PRILOSEC) 20 MG capsule Take 20 mg by mouth daily.    Vladimir Faster Glycol-Propyl Glycol (SYSTANE) 0.4-0.3 % SOLN Apply to eye.    . simvastatin (ZOCOR) 40 MG tablet TAKE 1 TABLET (40 MG TOTAL) BY MOUTH AT BEDTIME. 90 tablet 3  . SYMBICORT 160-4.5 MCG/ACT inhaler INHALE 2 PUFFS INTO THE LUNGS 2 (TWO) TIMES DAILY. 10.2 Inhaler 5  . tiZANidine (ZANAFLEX) 4 MG tablet Take 1 tablet (4 mg total) by mouth every 6 (six) hours as needed for muscle spasms. 30 tablet 0  . traMADol (ULTRAM) 50 MG tablet Take 1-2 every 6 hours as needed 45 tablet 1  . zolpidem (AMBIEN) 5 MG tablet TAKE 1 TABLET BY MOUTH AS NEEDED AT BEDTIME 30 tablet 2   Current Facility-Administered Medications on File Prior to Visit  Medication Dose Route Frequency Provider Last Rate Last Dose  . 0.9 %  sodium chloride infusion  500 mL Intravenous Continuous Inda Castle, MD       No Known Allergies History   Social History  . Marital Status: Widowed    Spouse Name: N/A  . Number of Children: 0  . Years of Education: N/A   Occupational History  .  Social History Main Topics  . Smoking status: Former Smoker    Quit date: 07/24/2005  . Smokeless tobacco: Never Used  . Alcohol Use: No  . Drug Use: No  . Sexual Activity: Not on file   Other Topics Concern  . Not on file   Social History Narrative      Review of Systems  All other systems reviewed and are negative.      Objective:   Physical Exam  Constitutional: She appears well-developed and well-nourished.  HENT:  Right Ear: External ear normal.  Left Ear: External ear normal.  Nose: Nose normal.  Mouth/Throat: Oropharynx is clear and moist. No oropharyngeal exudate.  Neck: Neck supple.  Cardiovascular: Normal rate, regular rhythm and normal heart sounds.   No murmur heard. Pulmonary/Chest: Effort normal and breath sounds normal.  Lymphadenopathy:    She has no cervical adenopathy.  Vitals  reviewed.         Assessment & Plan:  Acute bronchitis due to other specified organisms - Plan: azithromycin (ZITHROMAX) 250 MG tablet, HYDROcodone-homatropine (HYCODAN) 5-1.5 MG/5ML syrup  Patient has bronchitis. Begin a Z-Pak. Give the patient Hycodan 1 teaspoon every 6 hours as needed for cough.

## 2014-07-17 LAB — HM MAMMOGRAPHY: HM Mammogram: NORMAL

## 2014-07-30 ENCOUNTER — Encounter: Payer: Self-pay | Admitting: Family Medicine

## 2014-12-14 ENCOUNTER — Ambulatory Visit (INDEPENDENT_AMBULATORY_CARE_PROVIDER_SITE_OTHER): Payer: Medicare Other | Admitting: Family Medicine

## 2014-12-14 ENCOUNTER — Encounter: Payer: Self-pay | Admitting: Family Medicine

## 2014-12-14 VITALS — BP 136/86 | HR 82 | Temp 98.6°F | Resp 20 | Ht 65.5 in | Wt 184.0 lb

## 2014-12-14 DIAGNOSIS — Z0000 Encounter for general adult medical examination without abnormal findings: Secondary | ICD-10-CM | POA: Diagnosis not present

## 2014-12-14 DIAGNOSIS — J42 Unspecified chronic bronchitis: Secondary | ICD-10-CM | POA: Diagnosis not present

## 2014-12-14 DIAGNOSIS — E038 Other specified hypothyroidism: Secondary | ICD-10-CM | POA: Diagnosis not present

## 2014-12-14 DIAGNOSIS — E785 Hyperlipidemia, unspecified: Secondary | ICD-10-CM

## 2014-12-14 LAB — CBC WITH DIFFERENTIAL/PLATELET
BASOS ABS: 0 10*3/uL (ref 0.0–0.1)
BASOS PCT: 0 % (ref 0–1)
EOS ABS: 0.3 10*3/uL (ref 0.0–0.7)
Eosinophils Relative: 3 % (ref 0–5)
HCT: 41.5 % (ref 36.0–46.0)
Hemoglobin: 14.6 g/dL (ref 12.0–15.0)
LYMPHS ABS: 3.9 10*3/uL (ref 0.7–4.0)
Lymphocytes Relative: 37 % (ref 12–46)
MCH: 29.3 pg (ref 26.0–34.0)
MCHC: 35.2 g/dL (ref 30.0–36.0)
MCV: 83.2 fL (ref 78.0–100.0)
MPV: 9.7 fL (ref 8.6–12.4)
Monocytes Absolute: 0.8 10*3/uL (ref 0.1–1.0)
Monocytes Relative: 8 % (ref 3–12)
NEUTROS PCT: 52 % (ref 43–77)
Neutro Abs: 5.5 10*3/uL (ref 1.7–7.7)
PLATELETS: 362 10*3/uL (ref 150–400)
RBC: 4.99 MIL/uL (ref 3.87–5.11)
RDW: 14 % (ref 11.5–15.5)
WBC: 10.5 10*3/uL (ref 4.0–10.5)

## 2014-12-14 LAB — COMPLETE METABOLIC PANEL WITH GFR
ALBUMIN: 4.1 g/dL (ref 3.6–5.1)
ALK PHOS: 54 U/L (ref 33–130)
ALT: 17 U/L (ref 6–29)
AST: 18 U/L (ref 10–35)
BILIRUBIN TOTAL: 0.8 mg/dL (ref 0.2–1.2)
BUN: 12 mg/dL (ref 7–25)
CO2: 27 mmol/L (ref 20–31)
Calcium: 9.6 mg/dL (ref 8.6–10.4)
Chloride: 101 mmol/L (ref 98–110)
Creat: 0.79 mg/dL (ref 0.50–0.99)
GFR, Est African American: 88 mL/min (ref >=60)
GFR, Est Non African American: 77 mL/min (ref >=60)
GLUCOSE: 97 mg/dL (ref 70–99)
Potassium: 4.3 mmol/L (ref 3.5–5.3)
SODIUM: 139 mmol/L (ref 135–146)
Total Protein: 7.2 g/dL (ref 6.1–8.1)

## 2014-12-14 LAB — TSH: TSH: 2.743 u[IU]/mL (ref 0.350–4.500)

## 2014-12-14 LAB — LIPID PANEL
CHOLESTEROL: 178 mg/dL (ref 125–200)
HDL: 44 mg/dL — AB (ref >=46)
LDL Cholesterol: 90 mg/dL (ref <130)
Total CHOL/HDL Ratio: 4 Ratio (ref <=5.0)
Triglycerides: 218 mg/dL — ABNORMAL HIGH (ref <150)
VLDL: 44 mg/dL — ABNORMAL HIGH (ref <30)

## 2014-12-14 MED ORDER — FLUTICASONE PROPIONATE 50 MCG/ACT NA SUSP: NASAL | Status: DC

## 2014-12-14 NOTE — Progress Notes (Signed)
Subjective:    Patient ID: Mary Blevins, female    DOB: Jun 13, 1945, 69 y.o.   MRN: [id number]  HPI Patient is here today for complete physical exam. Her last colonoscopy was in 2012. It was significant for a tubular adenoma. She is due for repeat colonoscopy in 2017. Her mammogram was performed in July of this year and was normal. She denies any history of an abnormal Pap smear. Due to her age, she does not require repeat Pap smears. She is due for a bone density test in 2017. Last bone density was in 2014. At that time she had borderline osteopenia in her right hip. I will recheck that next year. She is also due for fasting lab work. Immunizations are up-to-date. She's had her flu shot, Pneumovax 23, Prevnar 13, her tetanus shot, and the shingles vaccine Past Medical History  Diagnosis Date  . MVP (mitral valve prolapse)     palpitations  . Generalized headaches   . COPD (chronic obstructive pulmonary disease)   . Elevated lipids   . Osteopenia   . Hypothyroidism   . Arthritis   . History of pneumonia   . Hyperlipidemia   . Sinusitis    Past Surgical History  Procedure Laterality Date  . Thyroidectomy    . Dilation and curettage of uterus    . Tonsillectomy     Current Outpatient Prescriptions on File Prior to Visit  Medication Sig Dispense Refill  . albuterol (PROVENTIL HFA;VENTOLIN HFA) 108 (90 BASE) MCG/ACT inhaler Inhale 2 puffs into the lungs every 6 (six) hours as needed for wheezing or shortness of breath. 1 Inhaler 0  . aspirin 81 MG tablet Take 81 mg by mouth daily.      Marland Kitchen azithromycin (ZITHROMAX) 250 MG tablet 2 tabs poqday1, 1 tab poqday 2-5 6 tablet 0  . Cholecalciferol (VITAMIN D-3) 1000 UNITS CAPS Take 1 capsule by mouth daily.    Marland Kitchen dextromethorphan-guaiFENesin (MUCINEX DM) 30-600 MG per 12 hr tablet Take 1 tablet by mouth daily.    . diclofenac (VOLTAREN) 75 MG EC tablet Take 1 tablet (75 mg total) by mouth 2 (two) times daily between meals as needed. 60  tablet 0  . fluticasone (FLONASE) 50 MCG/ACT nasal spray USE 2 SPRAYS EACH SIDE ONCE A DAY 48 g 11  . HYDROcodone-homatropine (HYCODAN) 5-1.5 MG/5ML syrup Take 5 mLs by mouth every 8 (eight) hours as needed for cough. 120 mL 0  . levalbuterol (XOPENEX) 1.25 MG/3ML nebulizer solution Take 1.25 mg by nebulization every 8 (eight) hours as needed for wheezing. 72 mL 12  . levocetirizine (XYZAL) 5 MG tablet Take 1 tablet (5 mg total) by mouth every evening. 30 tablet 3  . levothyroxine (SYNTHROID, LEVOTHROID) 50 MCG tablet Take 1 tablet (50 mcg total) by mouth daily before breakfast. 30 tablet 11  . naproxen (NAPROSYN) 500 MG tablet Take 1 tablet (500 mg total) by mouth 2 (two) times daily with a meal. 60 tablet 3  . omeprazole (PRILOSEC) 20 MG capsule Take 20 mg by mouth daily.    Vladimir Faster Glycol-Propyl Glycol (SYSTANE) 0.4-0.3 % SOLN Apply to eye.    . simvastatin (ZOCOR) 40 MG tablet TAKE 1 TABLET (40 MG TOTAL) BY MOUTH AT BEDTIME. 90 tablet 3  . SYMBICORT 160-4.5 MCG/ACT inhaler INHALE 2 PUFFS INTO THE LUNGS 2 (TWO) TIMES DAILY. 10.2 Inhaler 5  . tiZANidine (ZANAFLEX) 4 MG tablet Take 1 tablet (4 mg total) by mouth every 6 (six) hours as needed for  muscle spasms. 30 tablet 0  . traMADol (ULTRAM) 50 MG tablet Take 1-2 every 6 hours as needed 45 tablet 1  . zolpidem (AMBIEN) 5 MG tablet TAKE 1 TABLET BY MOUTH AS NEEDED AT BEDTIME 30 tablet 2   Current Facility-Administered Medications on File Prior to Visit  Medication Dose Route Frequency Provider Last Rate Last Dose  . 0.9 %  sodium chloride infusion  500 mL Intravenous Continuous Inda Castle, MD       No Known Allergies Social History   Social History  . Marital Status: Widowed    Spouse Name: N/A  . Number of Children: 0  . Years of Education: N/A   Occupational History  .     Social History Main Topics  . Smoking status: Former Smoker    Quit date: 07/24/2005  . Smokeless tobacco: Never Used  . Alcohol Use: No  . Drug  Use: No  . Sexual Activity: Not on file   Other Topics Concern  . Not on file   Social History Narrative   Family History  Problem Relation Age of Onset  . Colon cancer Neg Hx   . Esophageal cancer Neg Hx   . Rectal cancer Neg Hx   . Stomach cancer Neg Hx   . Breast cancer Maternal Aunt   . Heart attack Maternal Grandmother   . Heart attack Mother   . Heart attack      niece  . Diabetes Brother   . Prostate cancer Maternal Grandfather      Review of Systems  All other systems reviewed and are negative.      Objective:   Physical Exam  Constitutional: She is oriented to person, place, and time. She appears well-developed and well-nourished. No distress.  HENT:  Head: Normocephalic and atraumatic.  Right Ear: External ear normal.  Left Ear: External ear normal.  Nose: Nose normal.  Mouth/Throat: Oropharynx is clear and moist. No oropharyngeal exudate.  Eyes: Conjunctivae and EOM are normal. Pupils are equal, round, and reactive to light. Right eye exhibits no discharge. Left eye exhibits no discharge. No scleral icterus.  Neck: Normal range of motion. Neck supple. No JVD present. No tracheal deviation present. No thyromegaly present.  Cardiovascular: Normal rate, regular rhythm, normal heart sounds and intact distal pulses.  Exam reveals no gallop and no friction rub.   No murmur heard. Pulmonary/Chest: Effort normal and breath sounds normal. No stridor. No respiratory distress. She has no wheezes. She has no rales. She exhibits no tenderness.  Abdominal: Soft. Bowel sounds are normal. She exhibits no distension and no mass. There is no tenderness. There is no rebound and no guarding.  Musculoskeletal: Normal range of motion. She exhibits no edema or tenderness.  Lymphadenopathy:    She has no cervical adenopathy.  Neurological: She is alert and oriented to person, place, and time. She has normal reflexes. She displays normal reflexes. No cranial nerve deficit. She  exhibits normal muscle tone. Coordination normal.  Skin: Skin is warm. No rash noted. She is not diaphoretic. No erythema. No pallor.  Psychiatric: She has a normal mood and affect. Her behavior is normal. Judgment and thought content normal.  Vitals reviewed.         Assessment & Plan:  Routine general medical examination at a health care facility - Plan: CBC with Differential/Platelet  HLD (hyperlipidemia) - Plan: CBC with Differential/Platelet, COMPLETE METABOLIC PANEL WITH GFR, Lipid panel  Chronic bronchitis, unspecified chronic bronchitis type ([place]) - Plan:  CBC with Differential/Platelet  Other specified hypothyroidism - Plan: TSH  Patient's physical exam is completely normal. She does complain of some fatigue. Therefore I will check a CBC, CMP, fasting lipid panel, and TSH. Her COPD is stable. She has not had any recent exacerbations. She sees to be well managed on Symbicort. Her immunizations are up-to-date. Her cancer screening is up-to-date. I recommend a DEXA scan next year. Due to her age she does not require a Pap smear

## 2015-01-07 ENCOUNTER — Telehealth: Payer: Self-pay | Admitting: Family Medicine

## 2015-01-07 NOTE — Telephone Encounter (Signed)
Patient returned call and states that she has increased pain and cannot stand for long periods of time.   Reports that she has seen MD for this in the past and PCP stated that if pain is unbearable he would refer to ortho.   MD please advise.

## 2015-01-07 NOTE — Telephone Encounter (Signed)
Ok to refer.

## 2015-01-07 NOTE — Telephone Encounter (Signed)
Call placed to patient. LMTRC.  

## 2015-01-07 NOTE — Telephone Encounter (Signed)
Would need mri first to determine where to even give the injection.  Dr. Maureen Ralphs doesn't do back injections.  WHat symptoms is she having?

## 2015-01-07 NOTE — Telephone Encounter (Signed)
Pt would like for Dr. Dennard Schaumann to refer her to someone that can give her an injection in her back, She has seen Dr. Margaretmary Eddy in the past for knee injections and she really liked him.   [phone] Or [phone]

## 2015-01-08 NOTE — Telephone Encounter (Signed)
Call placed to patient. LMTRC.  

## 2015-01-08 NOTE — Telephone Encounter (Signed)
Need to start work up first.  Is pain in lumbar or thoracic spine?  Assuming Lumbar spine area, begin with L spine xray to determine the cause of pain.

## 2015-01-08 NOTE — Telephone Encounter (Signed)
Pt called back and was sure exactly where it was hurting tried to explain it because did not understand, I advise pt to come in to see me and show me where it hurts so can determine what xray to order. Pt will come in on Monday at her convenient.

## 2015-01-11 ENCOUNTER — Other Ambulatory Visit: Payer: Self-pay | Admitting: Family Medicine

## 2015-01-11 DIAGNOSIS — M545 Low back pain, unspecified: Secondary | ICD-10-CM

## 2015-01-11 NOTE — Telephone Encounter (Signed)
Pt came in showed me it hurt was lumbar spine, pt advised will order xray and go as [facility] at her convenience.

## 2015-01-12 ENCOUNTER — Ambulatory Visit (HOSPITAL_COMMUNITY)
Admission: RE | Admit: 2015-01-12 | Discharge: 2015-01-12 | Disposition: A | Discharge status: Home / Self Care | Payer: Medicare Other | Source: Ambulatory Visit | Attending: Family Medicine | Admitting: Family Medicine

## 2015-01-12 DIAGNOSIS — M545 Low back pain, unspecified: Secondary | ICD-10-CM

## 2015-01-13 ENCOUNTER — Other Ambulatory Visit: Payer: Self-pay | Admitting: Family Medicine

## 2015-01-13 DIAGNOSIS — M545 Low back pain, unspecified: Secondary | ICD-10-CM

## 2015-01-13 MED ORDER — NAPROXEN 500 MG PO TABS: 500.0000 mg | ORAL_TABLET | Freq: Two times a day (BID) | ORAL | Status: DC

## 2015-01-19 ENCOUNTER — Ambulatory Visit (INDEPENDENT_AMBULATORY_CARE_PROVIDER_SITE_OTHER): Payer: Medicare Other | Admitting: Family Medicine

## 2015-01-19 ENCOUNTER — Encounter: Payer: Self-pay | Admitting: Family Medicine

## 2015-01-19 VITALS — BP 90/58 | HR 80 | Temp 99.1°F | Resp 18 | Ht 65.5 in | Wt 182.0 lb

## 2015-01-19 DIAGNOSIS — J069 Acute upper respiratory infection, unspecified: Secondary | ICD-10-CM

## 2015-01-19 NOTE — Progress Notes (Signed)
Subjective:    Patient ID: [name], female    DOB: Aug 17, 1945, 69 y.o.   MRN: [id number]  HPIpresents with 2 days of rhinorrhea, head congestion, and nonproductive cough. She has a low-grade fever to 99. She denies any shortness of breath or chest pain or pleurisy. She denies any sore throat or sinus pain. Past Medical History  Diagnosis Date  . MVP (mitral valve prolapse)     palpitations  . Generalized headaches   . COPD (chronic obstructive pulmonary disease) ([place])   . Elevated lipids   . Osteopenia   . Hypothyroidism   . Arthritis   . History of pneumonia   . Hyperlipidemia   . Sinusitis    Past Surgical History  Procedure Laterality Date  . Thyroidectomy    . Dilation and curettage of uterus    . Tonsillectomy     Current Outpatient Prescriptions on File Prior to Visit  Medication Sig Dispense Refill  . albuterol (PROVENTIL HFA;VENTOLIN HFA) 108 (90 BASE) MCG/ACT inhaler Inhale 2 puffs into the lungs every 6 (six) hours as needed for wheezing or shortness of breath. 1 Inhaler 0  . aspirin 81 MG tablet Take 81 mg by mouth daily.      . Cholecalciferol (VITAMIN D-3) 1000 UNITS CAPS Take 1 capsule by mouth daily.    . cycloSPORINE (RESTASIS) 0.05 % ophthalmic emulsion 1 drop.    . diclofenac (VOLTAREN) 75 MG EC tablet Take 1 tablet (75 mg total) by mouth 2 (two) times daily between meals as needed. 60 tablet 0  . fluticasone (FLONASE) 50 MCG/ACT nasal spray USE 2 SPRAYS EACH SIDE ONCE A DAY 48 g 11  . HYDROcodone-homatropine (HYCODAN) 5-1.5 MG/5ML syrup Take 5 mLs by mouth every 8 (eight) hours as needed for cough. 120 mL 0  . levalbuterol (XOPENEX) 1.25 MG/3ML nebulizer solution Take 1.25 mg by nebulization every 8 (eight) hours as needed for wheezing. 72 mL 12  . levothyroxine (SYNTHROID, LEVOTHROID) 50 MCG tablet Take 1 tablet (50 mcg total) by mouth daily before breakfast. 30 tablet 11  . naproxen (NAPROSYN) 500 MG tablet Take 1 tablet (500 mg total) by mouth 2  (two) times daily with a meal. 60 tablet 3  . omeprazole (PRILOSEC) 20 MG capsule Take 20 mg by mouth daily.    Vladimir Faster Glycol-Propyl Glycol (SYSTANE) 0.4-0.3 % SOLN Apply to eye.    . simvastatin (ZOCOR) 40 MG tablet TAKE 1 TABLET (40 MG TOTAL) BY MOUTH AT BEDTIME. 90 tablet 3  . SYMBICORT 160-4.5 MCG/ACT inhaler INHALE 2 PUFFS INTO THE LUNGS 2 (TWO) TIMES DAILY. 10.2 Inhaler 5  . tiZANidine (ZANAFLEX) 4 MG tablet Take 1 tablet (4 mg total) by mouth every 6 (six) hours as needed for muscle spasms. 30 tablet 0  . traMADol (ULTRAM) 50 MG tablet Take 1-2 every 6 hours as needed 45 tablet 1  . zolpidem (AMBIEN) 5 MG tablet TAKE 1 TABLET BY MOUTH AS NEEDED AT BEDTIME 30 tablet 2   Current Facility-Administered Medications on File Prior to Visit  Medication Dose Route Frequency Provider Last Rate Last Dose  . 0.9 %  sodium chloride infusion  500 mL Intravenous Continuous Inda Castle, MD       No Known Allergies    Review of Systems  All other systems reviewed and are negative.      Objective:   Physical Exam  Constitutional: She appears well-developed and well-nourished. No distress.  HENT:  Right Ear: External ear normal.  Left Ear: External ear normal.  Nose: Nose normal.  Mouth/Throat: Oropharynx is clear and moist. No oropharyngeal exudate.  Eyes: Conjunctivae are normal.  Neck: Neck supple.  Cardiovascular: Normal rate, regular rhythm and normal heart sounds.   No murmur heard. Pulmonary/Chest: Effort normal and breath sounds normal. No respiratory distress. She has no wheezes. She has no rales.  Abdominal: Soft. Bowel sounds are normal.  Lymphadenopathy:    She has no cervical adenopathy.  Skin: She is not diaphoretic.  Vitals reviewed.         Assessment & Plan:  URI, acute  Symptoms are consistent with a viral upper respiratory infection. I recommended tincture of time. She can use Sudafed for nasal congestion. She can use Mucinex DM for cough. Symptoms  should improve over 5-7 days. Call me back if symptoms are not improving or immediately if worsening

## 2015-01-26 ENCOUNTER — Telehealth: Payer: Self-pay | Admitting: *Deleted

## 2015-01-26 NOTE — Telephone Encounter (Signed)
Pt states was in last week with URI and was put on Sudafed, was told to take for 5-7 days, pt is improving but still have some congestion and wants to know if you still want her to continue taking Sudafed? Pt states she is concerned about her BP rising if continues taking. MD please advise!  Call [phone] 1st then home number [phone]

## 2015-01-26 NOTE — Telephone Encounter (Signed)
Stop sudafed and allow uri to resolve gradually on its own.  If necessary can use coricedan HBP.

## 2015-01-26 NOTE — Telephone Encounter (Signed)
Pt aware.

## 2015-02-02 ENCOUNTER — Telehealth: Payer: Self-pay | Admitting: Family Medicine

## 2015-02-02 NOTE — Telephone Encounter (Signed)
Patient called for a sample of Symbicort. Gabriel Cirri brought up a sample and patient will pick up tomorrow.

## 2015-02-17 ENCOUNTER — Encounter: Payer: Self-pay | Admitting: Interventional Cardiology

## 2015-02-17 ENCOUNTER — Ambulatory Visit (INDEPENDENT_AMBULATORY_CARE_PROVIDER_SITE_OTHER): Payer: Medicare Other | Admitting: Interventional Cardiology

## 2015-02-17 VITALS — BP 134/80 | HR 71 | Ht 65.5 in | Wt 180.1 lb

## 2015-02-17 DIAGNOSIS — I471 Supraventricular tachycardia: Secondary | ICD-10-CM

## 2015-02-17 DIAGNOSIS — J439 Emphysema, unspecified: Secondary | ICD-10-CM | POA: Diagnosis not present

## 2015-02-17 NOTE — Patient Instructions (Signed)
Medication Instructions:  Your physician recommends that you continue on your current medications as directed. Please refer to the Current Medication list given to you today.   Labwork: None ordered  Testing/Procedures: None ordered  Follow-Up: Your physician wants you to follow-up in: 1-2 years You will receive a reminder letter in the mail two months in advance. If you don't receive a letter, please call our office to schedule the follow-up appointment.   Any Other Special Instructions Will Be Listed Below (If Applicable).     If you need a refill on your cardiac medications before your next appointment, please call your pharmacy.

## 2015-02-17 NOTE — Progress Notes (Signed)
Cardiology Office Note   Date:  02/17/2015   ID:  Mary Blevins, DOB 10-Jan-1946, MRN [id number]  PCP:  Odette Fraction, MD  Cardiologist:  Sinclair Grooms, MD   Chief Complaint  Patient presents with  . Palpitations      History of Present Illness: Mary Blevins is a 70 y.o. female who presents for PSVT and COPD.  The patient stopped taking her verapamil greater than 2 years ago. She has had no significant episode of PSVT. She is here to check and because she wants to have relationship in case problems arise. Specific questioning concerning chest pain are negative. She does have wheezing and shortness of breath that is particularly worse in the morning. She is also having difficulty with her back.    Past Medical History  Diagnosis Date  . MVP (mitral valve prolapse)     palpitations  . Generalized headaches   . COPD (chronic obstructive pulmonary disease) ([place])   . Elevated lipids   . Osteopenia   . Hypothyroidism   . Arthritis   . History of pneumonia   . Hyperlipidemia   . Sinusitis     Past Surgical History  Procedure Laterality Date  . Thyroidectomy    . Dilation and curettage of uterus    . Tonsillectomy       Current Outpatient Prescriptions  Medication Sig Dispense Refill  . albuterol (PROVENTIL HFA;VENTOLIN HFA) 108 (90 BASE) MCG/ACT inhaler Inhale 2 puffs into the lungs every 6 (six) hours as needed for wheezing or shortness of breath. 1 Inhaler 0  . aspirin 81 MG tablet Take 81 mg by mouth daily.      . cycloSPORINE (RESTASIS) 0.05 % ophthalmic emulsion Place 1 drop into both eyes 2 (two) times daily.     . fluticasone (FLONASE) 50 MCG/ACT nasal spray USE 2 SPRAYS EACH SIDE ONCE A DAY 48 g 11  . HYDROcodone-homatropine (HYCODAN) 5-1.5 MG/5ML syrup Take 5 mLs by mouth every 8 (eight) hours as needed for cough. 120 mL 0  . levalbuterol (XOPENEX) 1.25 MG/3ML nebulizer solution Take 1.25 mg by nebulization every 8 (eight) hours as  needed for wheezing. 72 mL 12  . levothyroxine (SYNTHROID, LEVOTHROID) 50 MCG tablet Take 1 tablet (50 mcg total) by mouth daily before breakfast. 30 tablet 11  . naproxen (NAPROSYN) 500 MG tablet Take 500 mg by mouth 2 (two) times daily as needed for mild pain.    Marland Kitchen omeprazole (PRILOSEC) 20 MG capsule Take 20 mg by mouth daily.    Vladimir Faster Glycol-Propyl Glycol (SYSTANE) 0.4-0.3 % SOLN Place 1 drop into both eyes daily.     . simvastatin (ZOCOR) 40 MG tablet TAKE 1 TABLET (40 MG TOTAL) BY MOUTH AT BEDTIME. 90 tablet 3  . SYMBICORT 160-4.5 MCG/ACT inhaler INHALE 2 PUFFS INTO THE LUNGS 2 (TWO) TIMES DAILY. 10.2 Inhaler 5  . tiZANidine (ZANAFLEX) 4 MG tablet Take 1 tablet (4 mg total) by mouth every 6 (six) hours as needed for muscle spasms. 30 tablet 0  . traMADol (ULTRAM) 50 MG tablet Take one to two (1 -2) tablets (50 mg to 100 mg total) by mouth every six (6) hours as needed for pain.    Marland Kitchen zolpidem (AMBIEN) 5 MG tablet Take 5 mg by mouth at bedtime as needed for sleep.     Current Facility-Administered Medications  Medication Dose Route Frequency Provider Last Rate Last Dose  . 0.9 %  sodium chloride infusion  500 mL Intravenous  Continuous Inda Castle, MD        Allergies:   Review of patient's allergies indicates no known allergies.    Social History:  The patient  reports that she quit smoking about 9 years ago. She has never used smokeless tobacco. She reports that she does not drink alcohol or use illicit drugs.   Family History:  The patient's family history includes Breast cancer in her maternal aunt; Dementia in her mother; Diabetes in her brother; Healthy in her father and sister; Heart attack in her maternal grandmother, mother, and other; Heart disease in her mother; Prostate cancer in her maternal grandfather. There is no history of Colon cancer, Esophageal cancer, Rectal cancer, or Stomach cancer.    ROS:  Please see the history of present illness.   Otherwise, review of  systems are positive for back pain and numbness and tingling in her feet. Wheezing. Cough..   All other systems are reviewed and negative.    PHYSICAL EXAM: VS:  BP 134/80 mmHg  Pulse 71  Ht 5' 5.5" (1.664 m)  Wt 180 lb 1.9 oz (81.702 kg)  BMI 29.51 kg/m2 , BMI Body mass index is 29.51 kg/(m^2). GEN: Well nourished, well developed, in no acute distress HEENT: normal Neck: no JVD, carotid bruits, or masses Cardiac: RRR.  There is no murmur, rub, or gallop. There is no edema. Respiratory:  clear to auscultation bilaterally, normal work of breathing. GI: soft, nontender, nondistended, + BS MS: no deformity or atrophy Skin: warm and dry, no rash Neuro:  Strength and sensation are intact Psych: euthymic mood, full affect   EKG:  EKG is normal sinus rhythm with very short PR interval and flattened T waves. Findings are compatible with possible Lown Jerilynn Birkenhead.  Recent Labs: 12/14/2014: ALT 17; BUN 12; Creat 0.79; Hemoglobin 14.6; Platelets 362; Potassium 4.3; Sodium 139; TSH 2.743    Lipid Panel    Component Value Date/Time   CHOL 178 12/14/2014 1046   TRIG 218* 12/14/2014 1046   HDL 44* 12/14/2014 1046   CHOLHDL 4.0 12/14/2014 1046   VLDL 44* 12/14/2014 1046   LDLCALC 90 12/14/2014 1046      Wt Readings from Last 3 Encounters:  02/17/15 180 lb 1.9 oz (81.702 kg)  01/19/15 182 lb (82.555 kg)  12/14/14 184 lb (83.462 kg)      Other studies Reviewed: Additional studies/ records that were reviewed today include: No cardiovascular data is available to review. The findings include prior workup has revealed structurally normal heart by echo, remote..    ASSESSMENT AND PLAN:  1. PSVT (paroxysmal supraventricular tachycardia) (H[facility]) No recent episodes of SVT. No medical therapy for several years. - EKG 12-Lead  2. Pulmonary emphysema, unspecified emphysema type ([place]) Her major and significant underlying problem.  3. 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  Current medicines are reviewed at length with the patient today.  The patient has the following concerns regarding medicines: none.  The following changes/actions have been instituted:    Avoid sympathomimetics and caffeine  Labs/ tests ordered today include:  Orders Placed This Encounter  Procedures  . EKG 12-Lead     Disposition:   FU with HS in 1 year  Signed, Sinclair Grooms, MD  02/17/2015 9:49 AM    [address], [place], [place]  [address] Phone: [phone]; Fax: [phone]

## 2015-03-15 ENCOUNTER — Telehealth: Payer: Self-pay | Admitting: Family Medicine

## 2015-03-15 NOTE — Telephone Encounter (Signed)
i would gradually increase exercise slowly to build up her conditioning. NTBS if chest pain with exercise.

## 2015-03-15 NOTE — Telephone Encounter (Signed)
Patient has copd and says that she is started to exercise again and it is wearing her out, has a couple of questions about whether or not this is good for her or not  [phone] (

## 2015-03-16 ENCOUNTER — Encounter: Payer: Self-pay | Admitting: Interventional Cardiology

## 2015-03-16 NOTE — Telephone Encounter (Signed)
Pt aware.

## 2015-03-24 ENCOUNTER — Other Ambulatory Visit: Payer: Self-pay | Admitting: Family Medicine

## 2015-04-05 ENCOUNTER — Other Ambulatory Visit: Payer: Self-pay | Admitting: Family Medicine

## 2015-04-20 ENCOUNTER — Telehealth: Payer: Self-pay | Admitting: Family Medicine

## 2015-04-20 NOTE — Telephone Encounter (Signed)
Patient would like to know what your thoughts are about her going to [facility]. She states that the back doctor didn't work out.  CB# [phone]

## 2015-04-20 NOTE — Telephone Encounter (Signed)
She has moderate degenerative disc disease but does not appear to be bad enough to warrant surgery.  She could certainly see them for a second opinion but I would be very cautious about surgery.

## 2015-04-21 NOTE — Telephone Encounter (Signed)
Patient aware of providers recommendations.  

## 2015-04-26 ENCOUNTER — Telehealth: Payer: Self-pay | Admitting: *Deleted

## 2015-04-26 NOTE — Telephone Encounter (Signed)
I do not know Dr. Ernestina Patches personally and have not had any patients report any experiences good or bad with Dr. Ernestina Patches.

## 2015-04-26 NOTE — Telephone Encounter (Signed)
Pt called stating her cousin went to Dr. Ernestina Patches today at [facility] and wanted to know what your thought or opinion was if any on Dr. Ernestina Patches, states she wants to go to him instead of Dr. Rich Brave at [facility]. Please advise!

## 2015-04-27 NOTE — Telephone Encounter (Signed)
LMTRC

## 2015-04-28 NOTE — Telephone Encounter (Signed)
Pt wants to go to Dr. Ernestina Patches at [facility] orth[facility]

## 2015-04-29 ENCOUNTER — Other Ambulatory Visit: Payer: Self-pay | Admitting: Family Medicine

## 2015-04-29 DIAGNOSIS — M545 Low back pain, unspecified: Secondary | ICD-10-CM

## 2015-04-29 NOTE — Telephone Encounter (Signed)
Referral sent to [facility] orth[facility]

## 2015-05-26 ENCOUNTER — Telehealth: Payer: Self-pay | Admitting: Family Medicine

## 2015-05-26 NOTE — Telephone Encounter (Signed)
Pt is requesting samples of Symbicort.  [phone]

## 2015-05-26 NOTE — Telephone Encounter (Signed)
Samples left up front and pt aware 

## 2015-05-28 ENCOUNTER — Encounter (HOSPITAL_COMMUNITY): Payer: Self-pay | Admitting: Emergency Medicine

## 2015-05-28 ENCOUNTER — Emergency Department (HOSPITAL_COMMUNITY): Payer: Medicare Other

## 2015-05-28 ENCOUNTER — Emergency Department (HOSPITAL_COMMUNITY)
Admission: EM | Admit: 2015-05-28 | Discharge: 2015-05-28 | Disposition: A | Discharge status: Home / Self Care | Payer: Medicare Other | Attending: Emergency Medicine | Admitting: Emergency Medicine

## 2015-05-28 ENCOUNTER — Telehealth: Payer: Self-pay | Admitting: Interventional Cardiology

## 2015-05-28 ENCOUNTER — Telehealth: Payer: Self-pay | Admitting: Family Medicine

## 2015-05-28 DIAGNOSIS — J449 Chronic obstructive pulmonary disease, unspecified: Secondary | ICD-10-CM | POA: Diagnosis not present

## 2015-05-28 DIAGNOSIS — R531 Weakness: Secondary | ICD-10-CM | POA: Insufficient documentation

## 2015-05-28 DIAGNOSIS — Z87891 Personal history of nicotine dependence: Secondary | ICD-10-CM | POA: Diagnosis not present

## 2015-05-28 DIAGNOSIS — R61 Generalized hyperhidrosis: Secondary | ICD-10-CM | POA: Insufficient documentation

## 2015-05-28 DIAGNOSIS — E785 Hyperlipidemia, unspecified: Secondary | ICD-10-CM | POA: Insufficient documentation

## 2015-05-28 DIAGNOSIS — E039 Hypothyroidism, unspecified: Secondary | ICD-10-CM | POA: Insufficient documentation

## 2015-05-28 LAB — URINALYSIS, ROUTINE W REFLEX MICROSCOPIC
Bilirubin Urine: NEGATIVE
Glucose, UA: NEGATIVE mg/dL
Hgb urine dipstick: NEGATIVE
Ketones, ur: NEGATIVE mg/dL
NITRITE: NEGATIVE
PROTEIN: NEGATIVE mg/dL
pH: 5.5 (ref 5.0–8.0)

## 2015-05-28 LAB — URINE MICROSCOPIC-ADD ON
Bacteria, UA: NONE SEEN
RBC / HPF: NONE SEEN RBC/hpf (ref 0–5)

## 2015-05-28 LAB — BASIC METABOLIC PANEL
Anion gap: 6 (ref 5–15)
BUN: 18 mg/dL (ref 6–20)
CHLORIDE: 105 mmol/L (ref 101–111)
CO2: 29 mmol/L (ref 22–32)
CREATININE: 0.82 mg/dL (ref 0.44–1.00)
Calcium: 8.9 mg/dL (ref 8.9–10.3)
GFR calc Af Amer: >60 mL/min (ref >60)
GFR calc non Af Amer: >60 mL/min (ref >60)
GLUCOSE: 105 mg/dL — AB (ref 65–99)
POTASSIUM: 3.8 mmol/L (ref 3.5–5.1)
Sodium: 140 mmol/L (ref 135–145)

## 2015-05-28 LAB — CBC
HEMATOCRIT: 42.6 % (ref 36.0–46.0)
Hemoglobin: 13.7 g/dL (ref 12.0–15.0)
MCH: 28 pg (ref 26.0–34.0)
MCHC: 32.2 g/dL (ref 30.0–36.0)
MCV: 86.9 fL (ref 78.0–100.0)
PLATELETS: 353 10*3/uL (ref 150–400)
RBC: 4.9 MIL/uL (ref 3.87–5.11)
RDW: 13.9 % (ref 11.5–15.5)
WBC: 10.9 10*3/uL — AB (ref 4.0–10.5)

## 2015-05-28 LAB — TROPONIN I

## 2015-05-28 NOTE — Discharge Instructions (Signed)

## 2015-05-28 NOTE — ED Provider Notes (Signed)
CSN: [id number]     Arrival date & time 05/28/15  1050 History  By signing my name below, I, Mary Blevins, attest that this documentation has been prepared under the direction and in the presence of Ripley Fraise, MD. Electronically Signed: Eustaquio Blevins, ED Scribe. 05/28/2015. 11:15 AM.   Chief Complaint  Patient presents with  . Weakness   Patient is a 70 y.o. female presenting with weakness. The history is provided by the patient. No language interpreter was used.  Weakness This is a new problem. The current episode started more than 2 days ago. The problem has been resolved. Associated symptoms include shortness of breath. Pertinent negatives include no chest pain, no abdominal pain and no headaches. Nothing aggravates the symptoms. Relieved by: cold air. Treatments tried: cold air. The treatment provided significant relief.     HPI Comments: Mary Blevins is a 70 y.o. female with PMHx COPD not on home O2, who presents to the Emergency Department complaining of sudden onset, intermittent, generalized weakness and lightheadedness that began a couple of days ago. Pt reports that a couple of days ago she felt flushed, near syncopal, and was diaphoretic.The minute she had cool air placed on her her symptoms resolved. She states having similar episodes intermittently for the past couple of days that are resolved with cool air. The most recent episode was earlier today. She denies having diaphoresis today but does report feeling warm to the touch. Pt has no other complaints at this time. She does mention having intermittent dyspnea on exertion but states it is not a new thing. No recent medication change. Denies syncope, headache, room spinning dizziness, visual changes, chest pain, back pain, abdominal pain, hematochezia, melena, dysuria, cough, hemiparesis, or any other associated symptoms. Pt is former smoker who quit 11 years ago.   Past Medical History  Diagnosis Date  . MVP (mitral  valve prolapse)     palpitations  . Generalized headaches   . COPD (chronic obstructive pulmonary disease) ([place])   . Elevated lipids   . Osteopenia   . Hypothyroidism   . Arthritis   . History of pneumonia   . Hyperlipidemia   . Sinusitis    Past Surgical History  Procedure Laterality Date  . Thyroidectomy    . Dilation and curettage of uterus    . Tonsillectomy     Family History  Problem Relation Age of Onset  . Colon cancer Neg Hx   . Esophageal cancer Neg Hx   . Rectal cancer Neg Hx   . Stomach cancer Neg Hx   . Breast cancer Maternal Aunt   . Heart attack Maternal Grandmother   . Heart attack Mother   . Heart disease Mother   . Dementia Mother   . Diabetes Brother   . Prostate cancer Maternal Grandfather   . Healthy Father   . Healthy Sister   . Heart attack Other     NIECE   Social History  Substance Use Topics  . Smoking status: Former Smoker    Quit date: 07/24/2005  . Smokeless tobacco: Never Used  . Alcohol Use: No   OB History    No data available     Review of Systems  Constitutional: Positive for diaphoresis.  Eyes: Negative for visual disturbance.  Respiratory: Positive for shortness of breath. Negative for cough.   Cardiovascular: Negative for chest pain.  Gastrointestinal: Negative for abdominal pain and blood in stool.  Genitourinary: Negative for dysuria.  Musculoskeletal: Negative for back pain.  Neurological: Positive for weakness (generalized) and light-headedness. Negative for dizziness, syncope and headaches.  All other systems reviewed and are negative.   Allergies  Review of patient's allergies indicates no known allergies.  Home Medications   Prior to Admission medications   Medication Sig Start Date End Date Taking? Authorizing Provider  albuterol (PROVENTIL HFA;VENTOLIN HFA) 108 (90 BASE) MCG/ACT inhaler Inhale 2 puffs into the lungs every 6 (six) hours as needed for wheezing or shortness of breath. 07/13/14   [name], MD  aspirin 81 MG tablet Take 81 mg by mouth daily.      Historical Provider, MD  cycloSPORINE (RESTASIS) 0.05 % ophthalmic emulsion Place 1 drop into both eyes 2 (two) times daily.  04/09/13   Historical Provider, MD  fluticasone (FLONASE) 50 MCG/ACT nasal spray USE 2 SPRAYS EACH SIDE ONCE A DAY 12/14/14   [name], MD  HYDROcodone-homatropine [facility]) 5-1.5 MG/5ML syrup Take 5 mLs by mouth every 8 (eight) hours as needed for cough. 07/13/14   [name], MD  levalbuterol Penne Lash) 1.25 MG/3ML nebulizer solution Take 1.25 mg by nebulization every 8 (eight) hours as needed for wheezing. 08/27/12   [name], MD  levothyroxine (SYNTHROID, LEVOTHROID) 50 MCG tablet TAKE 1 TABLET (50 MCG TOTAL) BY MOUTH DAILY BEFORE BREAKFAST. 04/05/15   [name], MD  naproxen (NAPROSYN) 500 MG tablet Take 500 mg by mouth 2 (two) times daily as needed for mild pain.    Historical Provider, MD  omeprazole (PRILOSEC) 20 MG capsule Take 20 mg by mouth daily.    Historical Provider, MD  Polyethyl Glycol-Propyl Glycol (SYSTANE) 0.4-0.3 % SOLN Place 1 drop into both eyes daily.     Historical Provider, MD  simvastatin (ZOCOR) 40 MG tablet TAKE 1 TABLET (40 MG TOTAL) BY MOUTH AT BEDTIME. 05/28/14   [name], MD  SYMBICORT 160-4.5 MCG/ACT inhaler INHALE 2 PUFFS INTO THE LUNGS 2 (TWO) TIMES DAILY. 07/06/14   [name], MD  tiZANidine (ZANAFLEX) 4 MG tablet TAKE 1 TABLET (4 MG TOTAL) BY MOUTH EVERY 6 (SIX) HOURS AS NEEDED FOR MUSCLE SPASMS. 03/24/15   [name], MD  traMADol (ULTRAM) 50 MG tablet Take one to two (1 -2) tablets (50 mg to 100 mg total) by mouth every six (6) hours as needed for pain.    Historical Provider, MD  zolpidem (AMBIEN) 5 MG tablet Take 5 mg by mouth at bedtime as needed for sleep.    Historical Provider, MD   BP 147/77 mmHg  Pulse 71  Temp(Src) 98.1 F (36.7 C) (Oral)  Resp 18  Ht 5\' 5"  (1.651 m)  Wt 180 lb (81.647 kg)  BMI 29.95 kg/m2  SpO2 100%    Physical Exam  Nursing note and vitals reviewed.  CONSTITUTIONAL: Well developed/well nourished HEAD: Normocephalic/atraumatic EYES: EOMI/PERRL ENMT: Mucous membranes moist NECK: supple no meningeal signs, no bruits SPINE/BACK:entire spine nontender CV: S1/S2 noted, no murmurs/rubs/gallops noted LUNGS: crackles in right base otherwise normal exam ABDOMEN: soft, nontender, no rebound or guarding, bowel sounds noted throughout abdomen GU:no cva tenderness NEURO: Pt is awake/alert/appropriate, moves all extremitiesx4.  No facial droop.  No arm or leg drift. No past pointing EXTREMITIES: pulses normal/equal, full ROM SKIN: warm, color normal PSYCH: no abnormalities of mood noted, alert and oriented to situation  ED Course  Procedures  DIAGNOSTIC STUDIES: Oxygen Saturation is 100% on RA, normal by my interpretation.    COORDINATION OF CARE: 11:12 AM-Discussed treatment plan which  includes CXR with pt at bedside and pt agreed to plan.  1:27 PM Workup reassuring Pt well appearing She is ambulatory No acute findings for near syncope Review of chart reveals h/o PSVT She denies palpitations but does report previous episodes of PSVT she felt flushed and lightheaded She can f/u with cardiology Her EKG is unchanged from prior I doubt CVA/ACS/PE or other acute cardiopulmonary abnormality  Labs Review Labs Reviewed  BASIC METABOLIC PANEL - Abnormal; Notable for the following:    Glucose, Bld 105 (*)    All other components within normal limits  CBC - Abnormal; Notable for the following:    WBC 10.9 (*)    All other components within normal limits  URINALYSIS, ROUTINE W REFLEX MICROSCOPIC (NOT AT [facility]) - Abnormal; Notable for the following:    Specific Gravity, Urine <1.005 (*)    Leukocytes, UA SMALL (*)    All other components within normal limits  URINE MICROSCOPIC-ADD ON - Abnormal; Notable for the following:    Squamous Epithelial / LPF 0-5 (*)    All other components  within normal limits  TROPONIN I    Imaging Review Dg Chest 2 View  05/28/2015  CLINICAL DATA:  Shortness of breath and dizziness for 3 days EXAM: CHEST  2 VIEW COMPARISON:  November 19, 2012 FINDINGS: There is no edema or consolidation. Heart is upper normal in size with pulmonary vascularity within normal limits. No adenopathy. No bone lesions. IMPRESSION: No edema or consolidation. Electronically Signed   By: Lowella Grip III M.D.   On: 05/28/2015 11:58   I have personally reviewed and evaluated these  lab results as part of my medical decision-making.   EKG Interpretation   Date/Time:  Friday May 28 2015 13:08:27 EDT Ventricular Rate:  65 PR Interval:  129 QRS Duration: 89 QT Interval:  392 QTC Calculation: 408 R Axis:   76 Text Interpretation:  Sinus rhythm Anteroseptal infarct, old Borderline T  abnormalities, inferior leads No significant change since last tracing  Confirmed by Christy Gentles  MD, [place] ([id number]) on 05/28/2015 1:17:54 PM      MDM   Final diagnoses:  Weakness    Nursing notes including past medical history and social history reviewed and considered in documentation Previous records reviewed and considered Labs/vital reviewed myself and considered during evaluation   I personally performed the services described in this documentation, which was scribed in my presence. The recorded information has been reviewed and is accurate.       Ripley Fraise, MD 05/28/15 1329

## 2015-05-28 NOTE — Telephone Encounter (Signed)
New message   Pt called to speak to Dr.Smith rn she states she is not having any issues she just want to talk to him about how she was feeling she has already seen PCP   She said she has been drinking a lot of cola and eating a lot of chocolates and she wonders if this is why her heart was rasing? Or is it the medication Naproxen 500mg  2x day that another doctor put her on  i asked pt was she in any pain, or distress, pt verbalized that she is not in any pain and that she is just sluggish

## 2015-05-28 NOTE — Telephone Encounter (Signed)
Patient would like to speak to you regarding naperson, and some side affects she is having  [phone]

## 2015-05-28 NOTE — Telephone Encounter (Signed)
I called and spoke with the patient. She states she saw the orthopedic doctor today and was told that her HR's were fast.  She states she is not feeling this. She confesses that she has been drinking about 2 cokes a day and eating chocolate. She was also started on Naproxen for back pain.  I advised the patient that the caffeine in her soda/ chocolate/ pain may all be contributing to her elevated HR's. She is unsure how fast she was going, but she feels ok. I advised her if she can cut out her chocolate and daily sodas, then she may notice some decrease in her HR's. I advised her, that unless she is having symptoms, then there is nothing else we would do at this time. She voices understanding.

## 2015-05-28 NOTE — ED Notes (Signed)
Pt ambulated well 

## 2015-05-28 NOTE — ED Notes (Signed)
Pt reports generalized weakness and fatigue for last several days. Pt reports intermittent episodes of sweating. Pt reports history of same with unknown etiology. Pt denies any pain,sob,dizziness.

## 2015-05-31 ENCOUNTER — Ambulatory Visit: Payer: Self-pay | Admitting: Family Medicine

## 2015-05-31 NOTE — Telephone Encounter (Signed)
Tried to call pt and talked to a female that could not hear well but she was the only one at home. Will try again later.

## 2015-06-01 ENCOUNTER — Encounter: Payer: Self-pay | Admitting: Family Medicine

## 2015-06-01 ENCOUNTER — Ambulatory Visit (INDEPENDENT_AMBULATORY_CARE_PROVIDER_SITE_OTHER): Payer: Medicare Other | Admitting: Family Medicine

## 2015-06-01 VITALS — BP 126/80 | HR 78 | Temp 98.1°F | Resp 16 | Ht 65.5 in | Wt 182.0 lb

## 2015-06-01 DIAGNOSIS — F418 Other specified anxiety disorders: Secondary | ICD-10-CM

## 2015-06-01 NOTE — Telephone Encounter (Signed)
Was seen in ov today

## 2015-06-01 NOTE — Progress Notes (Signed)
Subjective:    Patient ID: Mary Blevins, female    DOB: 07/06/1945, 70 y.o.   MRN: [id number]  HPI Recently went to the emergency room for "weakness". I have reviewed the emergency room evaluation. EKG and lab work was within normal limits. Questioning the patient further she now denies weakness. Instead she reports flushing and hot flashes that was suddenly hit her. She is extremely anxious when this happens. She feels that she cannot breathe. If she is able to get into cold air the symptoms. Immediately. She admits that she is under tremendous stress dealing with her daughter and her grandson. She is also caring for her mother who has mild dementia. Her brother thinks she is dealing with a lot of stress. She denies any weight loss or fevers. She denies any chest pain. She denies any shortness of breath at baseline. She denies any angina. She denies any pleurisy or hemoptysis. She denies any nausea vomiting or diarrhea. Past Medical History  Diagnosis Date  . MVP (mitral valve prolapse)     palpitations  . Generalized headaches   . COPD (chronic obstructive pulmonary disease) ([place])   . Elevated lipids   . Osteopenia   . Hypothyroidism   . Arthritis   . History of pneumonia   . Hyperlipidemia   . Sinusitis    Past Surgical History  Procedure Laterality Date  . Thyroidectomy    . Dilation and curettage of uterus    . Tonsillectomy     Current Outpatient Prescriptions on File Prior to Visit  Medication Sig Dispense Refill  . albuterol (PROVENTIL HFA;VENTOLIN HFA) 108 (90 BASE) MCG/ACT inhaler Inhale 2 puffs into the lungs every 6 (six) hours as needed for wheezing or shortness of breath. 1 Inhaler 0  . aspirin 81 MG tablet Take 81 mg by mouth daily.      . cycloSPORINE (RESTASIS) 0.05 % ophthalmic emulsion Place 1 drop into both eyes 2 (two) times daily.     . fluticasone (FLONASE) 50 MCG/ACT nasal spray USE 2 SPRAYS EACH SIDE ONCE A DAY 48 g 11  . levalbuterol (XOPENEX) 1.25  MG/3ML nebulizer solution Take 1.25 mg by nebulization every 8 (eight) hours as needed for wheezing. 72 mL 12  . levothyroxine (SYNTHROID, LEVOTHROID) 50 MCG tablet TAKE 1 TABLET (50 MCG TOTAL) BY MOUTH DAILY BEFORE BREAKFAST. 30 tablet 11  . omeprazole (PRILOSEC) 20 MG capsule Take 20 mg by mouth daily.    Vladimir Faster Glycol-Propyl Glycol (SYSTANE) 0.4-0.3 % SOLN Place 1 drop into both eyes daily.     . simvastatin (ZOCOR) 40 MG tablet TAKE 1 TABLET (40 MG TOTAL) BY MOUTH AT BEDTIME. 90 tablet 3  . SYMBICORT 160-4.5 MCG/ACT inhaler INHALE 2 PUFFS INTO THE LUNGS 2 (TWO) TIMES DAILY. 10.2 Inhaler 5  . tiZANidine (ZANAFLEX) 4 MG tablet TAKE 1 TABLET (4 MG TOTAL) BY MOUTH EVERY 6 (SIX) HOURS AS NEEDED FOR MUSCLE SPASMS. 30 tablet 0  . traMADol (ULTRAM) 50 MG tablet Take one to two (1 -2) tablets (50 mg to 100 mg total) by mouth every six (6) hours as needed for pain.    Marland Kitchen zolpidem (AMBIEN) 5 MG tablet Take 5 mg by mouth at bedtime as needed for sleep.     Current Facility-Administered Medications on File Prior to Visit  Medication Dose Route Frequency Provider Last Rate Last Dose  . 0.9 %  sodium chloride infusion  500 mL Intravenous Continuous Inda Castle, MD  No Known Allergies    Review of Systems  All other systems reviewed and are negative.      Objective:   Physical Exam  Constitutional: She appears well-developed and well-nourished. No distress.  HENT:  Right Ear: External ear normal.  Left Ear: External ear normal.  Nose: Nose normal.  Mouth/Throat: Oropharynx is clear and moist. No oropharyngeal exudate.  Eyes: Conjunctivae are normal.  Neck: Neck supple.  Cardiovascular: Normal rate, regular rhythm and normal heart sounds.   No murmur heard. Pulmonary/Chest: Effort normal and breath sounds normal. No respiratory distress. She has no wheezes. She has no rales.  Abdominal: Soft. Bowel sounds are normal.  Lymphadenopathy:    She has no cervical adenopathy.  Skin:  She is not diaphoretic.  Vitals reviewed.         Assessment & Plan:  Situational anxiety  I believe the patient is having panic attacks. Also possible symptoms could be due to vasovagal presyncope, thyroid abnormalities although TSH was normal in November, carcinoid syndrome, vasomotor symptoms secondary to menopause although she has been menopausal now for almost 20 years. Given her symptoms I believe these are mainly panic attacks. Patient will try Xanax 0.5 mg by mouth every 8 hours as needed for anxiety. If symptoms worsen, we will proceed with further workup and possibly consider a preventative medicine such as Lexapro

## 2015-06-06 ENCOUNTER — Other Ambulatory Visit: Payer: Self-pay | Admitting: Family Medicine

## 2015-06-11 ENCOUNTER — Ambulatory Visit (INDEPENDENT_AMBULATORY_CARE_PROVIDER_SITE_OTHER): Payer: Medicare Other | Admitting: Family Medicine

## 2015-06-11 ENCOUNTER — Encounter: Payer: Self-pay | Admitting: Family Medicine

## 2015-06-11 VITALS — BP 110/70 | HR 76 | Temp 98.5°F | Resp 18 | Wt 180.0 lb

## 2015-06-11 DIAGNOSIS — J42 Unspecified chronic bronchitis: Secondary | ICD-10-CM | POA: Diagnosis not present

## 2015-06-11 DIAGNOSIS — Z91048 Other nonmedicinal substance allergy status: Secondary | ICD-10-CM

## 2015-06-11 DIAGNOSIS — Z9109 Other allergy status, other than to drugs and biological substances: Secondary | ICD-10-CM

## 2015-06-11 MED ORDER — PREDNISONE 20 MG PO TABS: ORAL_TABLET | ORAL | Status: DC

## 2015-06-11 NOTE — Progress Notes (Signed)
Subjective:    Patient ID: Mary Blevins, female    DOB: January 05, 1946, 70 y.o.   MRN: [id number]  HPI Over the last week, the patient has developed seasonal allergies. She reports itchy watery eyes, sneezing, rhinorrhea, head congestion. She is developed a dry nonproductive cough. She has a history of emphysema and chronic bronchitis for which she takes Symbicort on a daily basis. She denies any shortness of breath. She denies any chest pain. She denies any purulent sputum. She denies any fevers. She is concerned about developing bronchitis Past Medical History  Diagnosis Date  . MVP (mitral valve prolapse)     palpitations  . Generalized headaches   . COPD (chronic obstructive pulmonary disease) ([place])   . Elevated lipids   . Osteopenia   . Hypothyroidism   . Arthritis   . History of pneumonia   . Hyperlipidemia   . Sinusitis    Past Surgical History  Procedure Laterality Date  . Thyroidectomy    . Dilation and curettage of uterus    . Tonsillectomy     Current Outpatient Prescriptions on File Prior to Visit  Medication Sig Dispense Refill  . albuterol (PROVENTIL HFA;VENTOLIN HFA) 108 (90 BASE) MCG/ACT inhaler Inhale 2 puffs into the lungs every 6 (six) hours as needed for wheezing or shortness of breath. 1 Inhaler 0  . aspirin 81 MG tablet Take 81 mg by mouth daily.      . cycloSPORINE (RESTASIS) 0.05 % ophthalmic emulsion Place 1 drop into both eyes 2 (two) times daily.     . fluticasone (FLONASE) 50 MCG/ACT nasal spray USE 2 SPRAYS EACH SIDE ONCE A DAY 48 g 11  . levalbuterol (XOPENEX) 1.25 MG/3ML nebulizer solution Take 1.25 mg by nebulization every 8 (eight) hours as needed for wheezing. 72 mL 12  . levothyroxine (SYNTHROID, LEVOTHROID) 50 MCG tablet TAKE 1 TABLET (50 MCG TOTAL) BY MOUTH DAILY BEFORE BREAKFAST. 30 tablet 11  . omeprazole (PRILOSEC) 20 MG capsule Take 20 mg by mouth daily.    Vladimir Faster Glycol-Propyl Glycol (SYSTANE) 0.4-0.3 % SOLN Place 1 drop into both  eyes daily.     . simvastatin (ZOCOR) 40 MG tablet TAKE 1 TABLET BY MOUTH AT BEDTIME 90 tablet 2  . SYMBICORT 160-4.5 MCG/ACT inhaler INHALE 2 PUFFS INTO THE LUNGS 2 (TWO) TIMES DAILY. 10.2 Inhaler 5  . tiZANidine (ZANAFLEX) 4 MG tablet TAKE 1 TABLET (4 MG TOTAL) BY MOUTH EVERY 6 (SIX) HOURS AS NEEDED FOR MUSCLE SPASMS. 30 tablet 0  . traMADol (ULTRAM) 50 MG tablet Take one to two (1 -2) tablets (50 mg to 100 mg total) by mouth every six (6) hours as needed for pain.    Marland Kitchen zolpidem (AMBIEN) 5 MG tablet Take 5 mg by mouth at bedtime as needed for sleep.     Current Facility-Administered Medications on File Prior to Visit  Medication Dose Route Frequency Provider Last Rate Last Dose  . 0.9 %  sodium chloride infusion  500 mL Intravenous Continuous Inda Castle, MD       Allergies  Allergen Reactions  . Meloxicam Other (See Comments)    BAD DREAMS, SAD THOUGHTS      Review of Systems  All other systems reviewed and are negative.      Objective:   Physical Exam  Constitutional: She appears well-developed and well-nourished. No distress.  HENT:  Right Ear: External ear normal.  Left Ear: External ear normal.  Nose: Mucosal edema and rhinorrhea present.  Mouth/Throat: Oropharynx is clear and moist. No oropharyngeal exudate.  Eyes: Conjunctivae are normal.  Neck: Neck supple.  Cardiovascular: Normal rate, regular rhythm and normal heart sounds.   No murmur heard. Pulmonary/Chest: Effort normal. No respiratory distress. She has wheezes. She has no rales.  Abdominal: Soft. Bowel sounds are normal.  Lymphadenopathy:    She has no cervical adenopathy.  Skin: She is not diaphoretic.  Vitals reviewed.         Assessment & Plan:  Environmental allergies - Plan: predniSONE (DELTASONE) 20 MG tablet  Chronic bronchitis, unspecified chronic bronchitis type ([place])  I believe the patient's allergies are causing her cough. Start Zyrtec 10 mg by mouth daily. Continue the Symbicort. If  cough worsens, she can do Combivent 2 puffs inhaled every 6 hours as needed. She is going out of town this weekend so I did give her a prescription for prednisone. Should the cough and wheezing worsen I want her to start prednisone however at the present time she does not require it.

## 2015-06-22 ENCOUNTER — Telehealth: Payer: Self-pay | Admitting: Family Medicine

## 2015-06-22 ENCOUNTER — Other Ambulatory Visit (HOSPITAL_COMMUNITY): Payer: Self-pay | Admitting: Physical Medicine and Rehabilitation

## 2015-06-22 DIAGNOSIS — M48061 Spinal stenosis, lumbar region without neurogenic claudication: Secondary | ICD-10-CM

## 2015-06-22 NOTE — Telephone Encounter (Signed)
Pt has a dry cough and would like to know if Dr. Dennard Schaumann can call something in to the [facility] in [place].

## 2015-06-23 NOTE — Telephone Encounter (Signed)
Pt seen on 06/11/15 for allergies.  Please advise

## 2015-06-24 MED ORDER — BENZONATATE 100 MG PO CAPS: 200.0000 mg | ORAL_CAPSULE | Freq: Three times a day (TID) | ORAL | Status: DC | PRN

## 2015-06-24 NOTE — Telephone Encounter (Signed)
rx sent in electronically, pt aware 

## 2015-06-24 NOTE — Telephone Encounter (Signed)
Tessalon perles 200 mg q 8 hrs prn cough.  

## 2015-06-30 ENCOUNTER — Ambulatory Visit (HOSPITAL_COMMUNITY)
Admission: RE | Admit: 2015-06-30 | Discharge: 2015-06-30 | Disposition: A | Discharge status: Home / Self Care | Payer: Medicare Other | Source: Ambulatory Visit | Attending: Physical Medicine and Rehabilitation | Admitting: Physical Medicine and Rehabilitation

## 2015-06-30 DIAGNOSIS — M545 Low back pain: Secondary | ICD-10-CM | POA: Insufficient documentation

## 2015-06-30 DIAGNOSIS — M5137 Other intervertebral disc degeneration, lumbosacral region: Secondary | ICD-10-CM | POA: Diagnosis not present

## 2015-06-30 DIAGNOSIS — M4186 Other forms of scoliosis, lumbar region: Secondary | ICD-10-CM | POA: Diagnosis not present

## 2015-06-30 DIAGNOSIS — M4806 Spinal stenosis, lumbar region: Secondary | ICD-10-CM | POA: Diagnosis present

## 2015-06-30 DIAGNOSIS — M5136 Other intervertebral disc degeneration, lumbar region: Secondary | ICD-10-CM | POA: Insufficient documentation

## 2015-06-30 DIAGNOSIS — M48061 Spinal stenosis, lumbar region without neurogenic claudication: Secondary | ICD-10-CM

## 2015-06-30 DIAGNOSIS — M47816 Spondylosis without myelopathy or radiculopathy, lumbar region: Secondary | ICD-10-CM | POA: Diagnosis not present

## 2015-06-30 DIAGNOSIS — I10 Essential (primary) hypertension: Secondary | ICD-10-CM | POA: Insufficient documentation

## 2015-07-01 ENCOUNTER — Telehealth: Payer: Self-pay | Admitting: Family Medicine

## 2015-07-01 MED ORDER — HYDROCODONE-HOMATROPINE 5-1.5 MG/5ML PO SYRP: 5.0000 mL | ORAL_SOLUTION | Freq: Four times a day (QID) | ORAL | Status: DC | PRN

## 2015-07-01 NOTE — Telephone Encounter (Signed)
Patient is calling to get rx for another round of her hydrocodone cough syrup if possible she is still coughing  [phone]

## 2015-07-01 NOTE — Telephone Encounter (Signed)
Prescription printed and patient made aware to come to office to pick up after 3pm on 07/01/2015.

## 2015-07-01 NOTE — Telephone Encounter (Signed)
[place] with hycodan 1 tsp poq6 hrs prn cough.

## 2015-07-13 ENCOUNTER — Encounter: Payer: Self-pay | Admitting: Gastroenterology

## 2015-07-19 ENCOUNTER — Telehealth: Payer: Self-pay | Admitting: Family Medicine

## 2015-07-19 LAB — HM MAMMOGRAPHY

## 2015-07-19 NOTE — Telephone Encounter (Signed)
Samples up front and pt aware to pick up

## 2015-07-19 NOTE — Telephone Encounter (Signed)
PT IS CALLING TO REQUEST SAMPLES OF SYMBICORT IF THEY ARE AVAILABLE.  [phone]

## 2015-07-30 ENCOUNTER — Encounter: Payer: Self-pay | Admitting: *Deleted

## 2015-07-30 ENCOUNTER — Encounter: Payer: Self-pay | Admitting: Family Medicine

## 2015-07-30 ENCOUNTER — Ambulatory Visit (INDEPENDENT_AMBULATORY_CARE_PROVIDER_SITE_OTHER): Payer: Medicare Other | Admitting: Family Medicine

## 2015-07-30 VITALS — BP 118/74 | HR 78 | Temp 98.4°F | Resp 18 | Ht 65.5 in | Wt 179.0 lb

## 2015-07-30 DIAGNOSIS — M48061 Spinal stenosis, lumbar region without neurogenic claudication: Secondary | ICD-10-CM

## 2015-07-30 DIAGNOSIS — M4806 Spinal stenosis, lumbar region: Secondary | ICD-10-CM

## 2015-07-30 MED ORDER — TRAMADOL HCL 50 MG PO TABS: 100.0000 mg | ORAL_TABLET | Freq: Four times a day (QID) | ORAL | Status: DC | PRN

## 2015-07-30 MED ORDER — ALBUTEROL SULFATE (2.5 MG/3ML) 0.083% IN NEBU: 2.5000 mg | INHALATION_SOLUTION | Freq: Four times a day (QID) | RESPIRATORY_TRACT | Status: DC | PRN

## 2015-07-30 NOTE — Progress Notes (Signed)
Subjective:    Patient ID: Mary Blevins, female    DOB: [birth year], 70 y.o.   MRN: [id number]  HPI  Presents today with lower back pain. Pain is located approximately level of L4-L5. It is primarily on the left-hand side. It occasionally radiates into her left thigh and her left gluteus. She had an MRI performed in June which revealed:  Lumbar spondylosis, scoliosis, short pedicles, and degenerative disc disease, causing moderate impingement at L2- 3, L3-4, and L4-5 ; and mild impingement at L5-S1, as detailed above.  She is currently receiving epidural steroid injections. She has received 2 injections with minimal benefit. She is using Naprosyn and Zanaflex as needed for pain. However she continues to have low back pain and is interested in other options. Past Medical History  Diagnosis Date  . MVP (mitral valve prolapse)     palpitations  . Generalized headaches   . COPD (chronic obstructive pulmonary disease) ([place])   . Elevated lipids   . Osteopenia   . Hypothyroidism   . Arthritis   . History of pneumonia   . Hyperlipidemia   . Sinusitis    Past Surgical History  Procedure Laterality Date  . Thyroidectomy    . Dilation and curettage of uterus    . Tonsillectomy     Current Outpatient Prescriptions on File Prior to Visit  Medication Sig Dispense Refill  . albuterol (PROVENTIL HFA;VENTOLIN HFA) 108 (90 BASE) MCG/ACT inhaler Inhale 2 puffs into the lungs every 6 (six) hours as needed for wheezing or shortness of breath. 1 Inhaler 0  . aspirin 81 MG tablet Take 81 mg by mouth daily.      . benzonatate (TESSALON PERLES) 100 MG capsule Take 2 capsules (200 mg total) by mouth every 8 (eight) hours as needed for cough. 20 capsule 0  . cycloSPORINE (RESTASIS) 0.05 % ophthalmic emulsion Place 1 drop into both eyes 2 (two) times daily.     . fluticasone (FLONASE) 50 MCG/ACT nasal spray USE 2 SPRAYS EACH SIDE ONCE A DAY 48 g 11  . HYDROcodone-homatropine (HYCODAN) 5-1.5  MG/5ML syrup Take 5 mLs by mouth every 6 (six) hours as needed for cough. 120 mL 0  . levalbuterol (XOPENEX) 1.25 MG/3ML nebulizer solution Take 1.25 mg by nebulization every 8 (eight) hours as needed for wheezing. 72 mL 12  . levothyroxine (SYNTHROID, LEVOTHROID) 50 MCG tablet TAKE 1 TABLET (50 MCG TOTAL) BY MOUTH DAILY BEFORE BREAKFAST. 30 tablet 11  . omeprazole (PRILOSEC) 20 MG capsule Take 20 mg by mouth daily.    Vladimir Faster Glycol-Propyl Glycol (SYSTANE) 0.4-0.3 % SOLN Place 1 drop into both eyes daily.     . simvastatin (ZOCOR) 40 MG tablet TAKE 1 TABLET BY MOUTH AT BEDTIME 90 tablet 2  . SYMBICORT 160-4.5 MCG/ACT inhaler INHALE 2 PUFFS INTO THE LUNGS 2 (TWO) TIMES DAILY. 10.2 Inhaler 5  . tiZANidine (ZANAFLEX) 4 MG tablet TAKE 1 TABLET (4 MG TOTAL) BY MOUTH EVERY 6 (SIX) HOURS AS NEEDED FOR MUSCLE SPASMS. 30 tablet 0  . zolpidem (AMBIEN) 5 MG tablet Take 5 mg by mouth at bedtime as needed for sleep.     Current Facility-Administered Medications on File Prior to Visit  Medication Dose Route Frequency Provider Last Rate Last Dose  . 0.9 %  sodium chloride infusion  500 mL Intravenous Continuous Inda Castle, MD       Allergies  Allergen Reactions  . Meloxicam Other (See Comments)    BAD DREAMS, SAD THOUGHTS  Social History   Social History  . Marital Status: Widowed    Spouse Name: N/A  . Number of Children: 0  . Years of Education: N/A   Occupational History  .     Social History Main Topics  . Smoking status: Former Smoker    Quit date: 07/24/2005  . Smokeless tobacco: Never Used  . Alcohol Use: No  . Drug Use: No  . Sexual Activity: Not on file   Other Topics Concern  . Not on file   Social History Narrative     Review of Systems  All other systems reviewed and are negative.      Objective:   Physical Exam  Constitutional: She appears well-developed and well-nourished.  Cardiovascular: Normal rate and regular rhythm.   Pulmonary/Chest: Effort normal  and breath sounds normal.  Musculoskeletal:       Lumbar back: She exhibits decreased range of motion, tenderness, bony tenderness and pain.  Vitals reviewed.         Assessment & Plan:  Spinal stenosis of lumbar region - Plan: traMADol (ULTRAM) 50 MG tablet, Ambulatory referral to Physical Therapy  I recommended a trial of physical therapy. She can continue to take Naprosyn daily for her back pain was Zanaflex as needed for muscle spasms. I will also give her tramadol which she can take at night to help her sleep.

## 2015-08-05 ENCOUNTER — Encounter: Payer: Self-pay | Admitting: Physician Assistant

## 2015-08-05 ENCOUNTER — Ambulatory Visit (INDEPENDENT_AMBULATORY_CARE_PROVIDER_SITE_OTHER): Payer: Medicare Other | Admitting: Physician Assistant

## 2015-08-05 VITALS — BP 134/86 | HR 88 | Temp 98.5°F | Resp 20 | Wt 179.0 lb

## 2015-08-05 DIAGNOSIS — J309 Allergic rhinitis, unspecified: Secondary | ICD-10-CM

## 2015-08-05 MED ORDER — CETIRIZINE HCL 10 MG PO TABS: 10.0000 mg | ORAL_TABLET | Freq: Every day | ORAL | Status: DC

## 2015-08-05 NOTE — Progress Notes (Signed)
Patient ID: Mary Blevins MRN: [id number], DOB: 1945-01-28, 70 y.o. Date of Encounter: 08/05/2015, 3:39 PM    Chief Complaint:  Chief Complaint  Patient presents with  . sick since yesterday    runny eyes, congestion, chest tight, cough     HPI: 70 y.o. year old AA female presents with above.   These symptoms just started yesterday. Says that her "eyes are running like I'm crying." Says she has scratchy throat, blowing her nose, coughing up drainage from draining down her throat.  I discussed that I thought symptoms sounded like allergies-- I asked if she usually has problem with allergies-- and she says she does have problems with allergies at least twice a year. Says that she usually uses Zyrtec but was out and thought she would come here and get checked before she got more Zyrtec.     Home Meds:   Outpatient Prescriptions Prior to Visit  Medication Sig Dispense Refill  . albuterol (PROVENTIL HFA;VENTOLIN HFA) 108 (90 BASE) MCG/ACT inhaler Inhale 2 puffs into the lungs every 6 (six) hours as needed for wheezing or shortness of breath. 1 Inhaler 0  . albuterol (PROVENTIL) (2.5 MG/3ML) 0.083% nebulizer solution Take 3 mLs (2.5 mg total) by nebulization every 6 (six) hours as needed for wheezing or shortness of breath. 150 mL 1  . aspirin 81 MG tablet Take 81 mg by mouth daily.      . cycloSPORINE (RESTASIS) 0.05 % ophthalmic emulsion Place 1 drop into both eyes 2 (two) times daily.     . fluticasone (FLONASE) 50 MCG/ACT nasal spray USE 2 SPRAYS EACH SIDE ONCE A DAY 48 g 11  . levalbuterol (XOPENEX) 1.25 MG/3ML nebulizer solution Take 1.25 mg by nebulization every 8 (eight) hours as needed for wheezing. 72 mL 12  . levothyroxine (SYNTHROID, LEVOTHROID) 50 MCG tablet TAKE 1 TABLET (50 MCG TOTAL) BY MOUTH DAILY BEFORE BREAKFAST. 30 tablet 11  . omeprazole (PRILOSEC) 20 MG capsule Take 20 mg by mouth daily.    Vladimir Faster Glycol-Propyl Glycol (SYSTANE) 0.4-0.3 % SOLN Place 1  drop into both eyes daily.     . simvastatin (ZOCOR) 40 MG tablet TAKE 1 TABLET BY MOUTH AT BEDTIME 90 tablet 2  . SYMBICORT 160-4.5 MCG/ACT inhaler INHALE 2 PUFFS INTO THE LUNGS 2 (TWO) TIMES DAILY. 10.2 Inhaler 5  . tiZANidine (ZANAFLEX) 4 MG tablet TAKE 1 TABLET (4 MG TOTAL) BY MOUTH EVERY 6 (SIX) HOURS AS NEEDED FOR MUSCLE SPASMS. 30 tablet 0  . traMADol (ULTRAM) 50 MG tablet Take 2 tablets (100 mg total) by mouth every 6 (six) hours as needed. Take one to two (1 -2) tablets (50 mg to 100 mg total) by mouth every six (6) hours as needed for pain. 60 tablet 0  . zolpidem (AMBIEN) 5 MG tablet Take 5 mg by mouth at bedtime as needed for sleep.    . benzonatate (TESSALON PERLES) 100 MG capsule Take 2 capsules (200 mg total) by mouth every 8 (eight) hours as needed for cough. (Patient not taking: Reported on 08/05/2015) 20 capsule 0  . HYDROcodone-homatropine (HYCODAN) 5-1.5 MG/5ML syrup Take 5 mLs by mouth every 6 (six) hours as needed for cough. (Patient not taking: Reported on 08/05/2015) 120 mL 0   Facility-Administered Medications Prior to Visit  Medication Dose Route Frequency Provider Last Rate Last Dose  . 0.9 %  sodium chloride infusion  500 mL Intravenous Continuous Inda Castle, MD        Allergies:  Allergies  Allergen Reactions  . Meloxicam Other (See Comments)    BAD DREAMS, SAD THOUGHTS      Review of Systems: See HPI for pertinent ROS. All other ROS negative.    Physical Exam: Blood pressure 134/86, pulse 88, temperature 98.5 F (36.9 C), temperature source Oral, resp. rate 20, weight 179 lb (81.194 kg)., Body mass index is 29.32 kg/(m^2). General:  WNWD AAF. Appears in no acute distress. HEENT: Normocephalic, atraumatic, eyes without discharge, sclera non-icteric, nares are without discharge. Bilateral auditory canals clear, TM's are without perforation, pearly grey and translucent with reflective cone of light bilaterally. Oral cavity moist, posterior pharynx without  exudate, erythema, peritonsillar abscess.  Neck: Supple. No thyromegaly. No lymphadenopathy. Lungs: Clear bilaterally to auscultation without wheezes, rales, or rhonchi. Breathing is unlabored. Heart: Regular rhythm. No murmurs, rubs, or gallops. Msk:  Strength and tone normal for age. Extremities/Skin: Warm and dry.  Neuro: Alert and oriented X 3. Moves all extremities spontaneously. Gait is normal. CNII-XII grossly in tact. Psych:  Responds to questions appropriately with a normal affect.     ASSESSMENT AND PLAN:  70 y.o. year old female with  1. Allergic rhinitis, unspecified allergic rhinitis type She is to take Zytec daily. If symptoms not controlled with this she can call and follow-up and would consider adding Singulair. At this time her lungs are totally clear and I hear no wheezing whatsoever. If this does start affecting her breathing, she can use her Xopenox and f/u with [place] if needed. - cetirizine (ZYRTEC) 10 MG tablet; Take 1 tablet (10 mg total) by mouth daily.  Dispense: 30 tablet; Refill: [address] [place], [place], [facility] 08/05/2015 3:39 PM

## 2015-08-06 ENCOUNTER — Ambulatory Visit: Payer: Medicare Other | Admitting: Family Medicine

## 2015-08-13 ENCOUNTER — Ambulatory Visit (HOSPITAL_COMMUNITY): Payer: Medicare Other | Attending: Family Medicine | Admitting: Physical Therapy

## 2015-08-13 DIAGNOSIS — M5442 Lumbago with sciatica, left side: Secondary | ICD-10-CM | POA: Diagnosis not present

## 2015-08-13 DIAGNOSIS — M6281 Muscle weakness (generalized): Secondary | ICD-10-CM | POA: Diagnosis present

## 2015-08-13 DIAGNOSIS — R293 Abnormal posture: Secondary | ICD-10-CM | POA: Diagnosis present

## 2015-08-13 NOTE — Therapy (Addendum)
[place], [place], [address] Phone: [phone]   Fax:  [phone]  Physical Therapy Evaluation  Patient Details  Name: Mary Blevins MRN: [id number] Date of Birth: 1945/11/18 Referring Provider: Dr. Jenna Luo  Encounter Date: 08/13/2015      PT End of Session - 08/13/15 1510    Visit Number 1   Number of Visits 12   Date for PT Re-Evaluation 09/12/15   Authorization Type [employer]   Authorization - Visit Number 1   Authorization - Number of Visits 10   PT Start Time 3614   PT Stop Time 1515   PT Time Calculation (min) 40 min   Activity Tolerance Patient tolerated treatment well   Behavior During Therapy [facility] for tasks assessed/performed      Past Medical History  Diagnosis Date  . MVP (mitral valve prolapse)     palpitations  . Generalized headaches   . COPD (chronic obstructive pulmonary disease) ([place])   . Elevated lipids   . Osteopenia   . Hypothyroidism   . Arthritis   . History of pneumonia   . Hyperlipidemia   . Sinusitis     Past Surgical History  Procedure Laterality Date  . Thyroidectomy    . Dilation and curettage of uterus    . Tonsillectomy      There were no vitals filed for this visit.       Subjective Assessment - 08/13/15 1439    Subjective Mary Blevins states that she has been having back pain for about 10 months now.  She states that she has had two series of shots which did not help the intensity of the pain but changed it's location from across her low back to the Left side of her back. Periodically the pain goes down into her Lt thigh to the knee.  She had an MRI which showed spinal stenosis and severe arthritis.      Pertinent History COPD    How long can you sit comfortably? no problem   How long can you stand comfortably? 6 minutes   How long can you walk comfortably? 15 minutes    Patient Stated Goals to less pain, be stronger in her legs and walk longer,  stand longer    Currently in Pain? Yes   Pain Score 8    Pain Location Back   Pain Orientation Left   Pain Descriptors / Indicators Aching;Throbbing;Shooting   Pain Type Chronic pain   Pain Radiating Towards Lt knee   Pain Onset More than a month ago   Pain Frequency Intermittent   Aggravating Factors  weight bearing   Pain Relieving Factors sitting   Effect of Pain on Daily Activities increases             [facility] PT Assessment - 08/13/15 0001    Assessment   Medical Diagnosis spinal stenosis   Referring Provider Dr. Jenna Luo   Prior Therapy chiropractic    Precautions   Precautions None   Restrictions   Weight Bearing Restrictions No   Balance Screen   Has the patient fallen in the past 6 months No   Has the patient had a decrease in activity level because of a fear of falling?  Yes   Is the patient reluctant to leave their home because of a fear of falling?  No   Home Social worker Private residence   Home Access Stairs to enter  Cognition   Overall Cognitive Status Within Functional Limits for tasks assessed   Observation/Other Assessments   Focus on Therapeutic Outcomes (FOTO)  56   Posture/Postural Control   Posture/Postural Control Postural limitations   Postural Limitations Rounded Shoulders;Forward head;Decreased lumbar lordosis;Decreased thoracic kyphosis;Posterior pelvic tilt;Weight shift right   ROM / Strength   AROM / PROM / Strength AROM;Strength   AROM   AROM Assessment Site Lumbar   Lumbar Flexion --  120 degree in hip (all motion done with hip not back )   Lumbar Extension 5   Strength   Strength Assessment Site Hip;Knee;Ankle   Right/Left Hip Right;Left   Right Hip Flexion 5/5   Right Hip Extension 4+/5   Right Hip ABduction 5/5   Left Hip Flexion 4+/5   Left Hip Extension 3-/5   Left Hip ABduction 4/5   Right/Left Knee Right;Left   Right Knee Flexion 5/5   Right Knee Extension 5/5   Left Knee Flexion 3+/5   Left  Knee Extension 5/5   Right/Left Ankle Right;Left   Right Ankle Dorsiflexion 5/5   Left Ankle Dorsiflexion 5/5                   [facility] Adult PT Treatment/Exercise - 08/13/15 0001    Exercises   Exercises Lumbar   Lumbar Exercises: Stretches   Active Hamstring Stretch 2 reps;30 seconds   Single Knee to Chest Stretch 2 reps;30 seconds   Standing Extension 5 reps   Lumbar Exercises: Supine   Ab Set 5 reps   Bridge 10 reps                PT Education - 08/13/15 1509    Education provided Yes   Education Details HEP; ;the importance of proper posture   Person(s) Educated Patient   Methods Explanation;Handout;Tactile cues;Demonstration   Comprehension Verbalized understanding          PT Short Term Goals - 08/13/15 1614    PT SHORT TERM GOAL #1   Title Pt to be independent in HEP to allow pt to walk in an upright position    Time 4   Period Weeks   Status New   PT SHORT TERM GOAL #2   Title Pt Lt leg pain to radiate no further than mid thigh level to demonstrate decreased nerve irritation.    Time 2   Period Weeks   Status New   PT SHORT TERM GOAL #3   Title Pt back pain to be no greater than a 4/10 to allow pt to stand for 15 minute in order to make a small meal.    Time 3   Period Weeks   Status New           PT Long Term Goals - 08/13/15 1617    PT LONG TERM GOAL #1   Title Pt strength in her Lt LE to be at least a 4/5 to allow pt to come from sitting on the couch to standing without experiencing any increased pain.    Time 6   Period Weeks   Status New   PT LONG TERM GOAL #2   Title Pt back pain to be no greater than a 1/10  to allow  walking for 30 minutes.    Time 6   Period Weeks   Status New   PT LONG TERM GOAL #3   Title Pt to be able to stand for 30 minutes to make an in depth meal  without increased back pain.    Time 6   Period Weeks   Status New               Plan - 08/13/15 1606    Clinical Impression Statement  Mary Blevins is a 70 yo female who has had progressive low back pain for the past 10 months.  She has had two series of shots that were unsuccessful.  She is now being referred to skilled physical therapy.  Evaluation demonstrates postrual changes, decreased lumbar motion, decreased activity tolerance, tight paraspinal musculature and decreased strength of her Lt gluteal maximus and hamstring musculature.  Ms. Battie will benefit from skilled physical therapy  to address these issues and maximize her functional ability    Rehab Potential Good   PT Frequency 2x / week   PT Duration 6 weeks   PT Treatment/Interventions ADLs/Self Care Home Management;Electrical Stimulation;Moist Heat;Therapeutic exercise;Therapeutic activities;Functional mobility training;Ultrasound;Traction;Manual techniques   PT Next Visit Plan begin prone exercises including SLR, knee flexion and hip IR/ER ; supine trunk rotation.   PT Home Exercise Plan given       Patient will benefit from skilled therapeutic intervention in order to improve the following deficits and impairments:  Abnormal gait, Decreased activity tolerance, Decreased range of motion, Decreased mobility, Decreased strength, Difficulty walking, Pain  Visit Diagnosis: Lumbago with sciatica, left side - Plan: PT plan of care cert/re-cert  Muscle weakness (generalized) - Plan: PT plan of care cert/re-cert  Abnormal posture - Plan: PT plan of care cert/re-cert      G-Codes - 67/89/38 1614    Functional Assessment Tool Used foto   Functional Limitation Mobility: Walking and moving around   Mobility: Walking and Moving Around Current Status [id number]) At least 40 percent but less than 60 percent impaired, limited or restricted   Mobility: Walking and Moving Around Goal Status [id number]) At least 20 percent but less than 40 percent impaired, limited or restricted       Problem List Patient Active Problem List   Diagnosis Date Noted  . PSVT (paroxysmal  supraventricular tachycardia) ([place]) 02/17/2015  . Back pain 07/22/2012  . Acute pain of left hip 07/22/2012  . Stricture and stenosis of esophagus 09/18/2011  . Personal history of colonic polyps 09/18/2011  . COPD (chronic obstructive pulmonary disease) ([place]) 07/14/2010  . Generalized headaches 07/14/2010  . Osteopenia 07/14/2010  . Hypothyroid 07/14/2010  . Elevated lipids 07/14/2010    Rayetta Humphrey, PT CLT [phone] 08/13/2015, 4:25 PM  [place] [address] [place], [place], [address] Phone: [phone]   Fax:  [phone]  Name: Mary Blevins MRN: [id number] Date of Birth: 1945/07/26  PHYSICAL THERAPY DISCHARGE SUMMARY  Visits from Start of Care: 1  Current functional level related to goals / functional outcomes: As above    Remaining deficits: As above   Education / Equipment: HEP Plan: Patient agrees to discharge.  Patient goals were not met. Patient is being discharged due to financial reasons.  ?????    Rayetta Humphrey, [place] CLT [phone]

## 2015-08-13 NOTE — Patient Instructions (Addendum)
Backward Bend (Standing)    Arch backward to make hollow of back deeper. Hold _3__ seconds. Repeat ___5-10_ times per set. Do __1__ sets per session. Do __3__ sessions per day.  http://orth.exer.us/178   Copyright  VHI. All rights reserved.  Knee-to-Chest Stretch: Unilateral    With hand behind right knee, pull knee in to chest until a comfortable stretch is felt in lower back and buttocks. Keep back relaxed. Hold _20___ seconds. Repeat __3__ times per set. Do __1__ sets per session. Do __2__ sessions per day.  http://orth.exer.us/126   Copyright  VHI. All rights reserved.  Hamstring Stretch: Active    Support behind right knee. Starting with knee bent, attempt to straighten knee until a comfortable stretch is felt in back of thigh. Hold ___30_ seconds. Repeat _3___ times per set. Do __1__ sets per session. Do ___2_ sessions per day.  http://orth.exer.us/158   Copyright  VHI. All rights reserved.  Isometric Abdominal    Lying on back with knees bent, tighten stomach by pressing elbows down. Hold __5__ seconds. Repeat __10__ times per set. Do _1___ sets per session. Do ___2_ sessions per day.  http://orth.exer.us/1086   Copyright  VHI. All rights reserved.  Bridging    Slowly raise buttocks from floor, keeping stomach tight. Repeat _10___ times per set. Do __1__ sets per session. Do __2__ sessions per day.  http://orth.exer.us/1096   Copyright  VHI. All rights reserved.  Gluteal Sets    Tighten buttocks while pressing pelvis to floor. Hold __5__ seconds. Repeat _10___ times per set. Do ___1_ sets per session. Do ____ sessions per day. 2 http://orth.exer.us/104   Copyright  VHI. All rights reserved.

## 2015-08-16 ENCOUNTER — Ambulatory Visit (HOSPITAL_COMMUNITY): Payer: Medicare Other | Admitting: Physical Therapy

## 2015-08-16 ENCOUNTER — Encounter (HOSPITAL_COMMUNITY): Payer: Self-pay

## 2015-08-16 DIAGNOSIS — M5442 Lumbago with sciatica, left side: Secondary | ICD-10-CM | POA: Diagnosis not present

## 2015-08-16 NOTE — Therapy (Signed)
[place], [place], [address] Phone: [phone]   Fax:  [phone]  Physical Therapy Treatment  Patient Details  Name: Mary Blevins MRN: [id number] Date of Birth: Apr 18, 1945 Referring Provider: Dr. Jenna Luo  Encounter Date: 08/16/2015      PT End of Session - 08/16/15 1305    Visit Number 2   Number of Visits 12   Date for PT Re-Evaluation 09/12/15   Authorization Type [employer]   Authorization - Visit Number 2   Authorization - Number of Visits 10   PT Start Time 1030   PT Stop Time 1110   PT Time Calculation (min) 40 min   Activity Tolerance Patient tolerated treatment well   Behavior During Therapy [facility] for tasks assessed/performed      Past Medical History:  Diagnosis Date  . Arthritis   . COPD (chronic obstructive pulmonary disease) ([place])   . Elevated lipids   . Generalized headaches   . History of pneumonia   . Hyperlipidemia   . Hypothyroidism   . MVP (mitral valve prolapse)    palpitations  . Osteopenia   . Sinusitis     Past Surgical History:  Procedure Laterality Date  . DILATION AND CURETTAGE OF UTERUS    . THYROIDECTOMY    . TONSILLECTOMY      There were no vitals filed for this visit.      Subjective Assessment - 08/16/15 1035    Subjective Pt reports that things were good after her eval last week. She tried her exercises once this weekend and noticed a "catch" in her Lt hip which went away shortly after. She has not attempted her HEP since. No pain or other complaints at this time.    Pertinent History COPD    How long can you sit comfortably? no problem   How long can you stand comfortably? 6 minutes   How long can you walk comfortably? 15 minutes    Patient Stated Goals to less pain, be stronger in her legs and walk longer, stand longer    Currently in Pain? No/denies   Pain Onset More than a month ago                         [facility] Adult PT  Treatment/Exercise - 08/16/15 0001      Lumbar Exercises: Stretches   Piriformis Stretch 2 reps;30 seconds   Piriformis Stretch Limitations seated     Lumbar Exercises: Supine   Ab Set 10 reps;5 seconds   Bridge 10 reps  x2 sets   Other Supine Lumbar Exercises SKTC 5x10 sec each    Other Supine Lumbar Exercises lower trunk rotation L/R x20     Lumbar Exercises: Sidelying   Clam 10 reps   Clam Limitations x2 sets each, red TB     Manual Therapy   Manual Therapy Passive ROM   Manual therapy comments Separate from all other interventions    Passive ROM B piriformis                 PT Education - 08/16/15 1303    Education provided Yes   Education Details reviewed/updated HEP   Person(s) Educated Patient   Methods Explanation;Demonstration;Handout   Comprehension Verbalized understanding;Returned demonstration          PT Short Term Goals - 08/13/15 1614      PT SHORT TERM GOAL #1   Title Pt to  be independent in HEP to allow pt to walk in an upright position    Time 4   Period Weeks   Status New     PT SHORT TERM GOAL #2   Title Pt Lt leg pain to radiate no further than mid thigh level to demonstrate decreased nerve irritation.    Time 2   Period Weeks   Status New     PT SHORT TERM GOAL #3   Title Pt back pain to be no greater than a 4/10 to allow pt to stand for 15 minute in order to make a small meal.    Time 3   Period Weeks   Status New           PT Long Term Goals - 08/13/15 1617      PT LONG TERM GOAL #1   Title Pt strength in her Lt LE to be at least a 4/5 to allow pt to come from sitting on the couch to standing without experiencing any increased pain.    Time 6   Period Weeks   Status New     PT LONG TERM GOAL #2   Title Pt back pain to be no greater than a 1/10  to allow  walking for 30 minutes.    Time 6   Period Weeks   Status New     PT LONG TERM GOAL #3   Title Pt to be able to stand for 30 minutes to make an in depth meal  without increased back pain.    Time 6   Period Weeks   Status New               Plan - 08/16/15 1305    Clinical Impression Statement Pt's evaluation and goals were reviewed, and I discussed the importance of HEP adherence. Remainder of the session focused on LE/trunk strength and flexibility. Pt was able to perform all activities without increased pain or difficulty.    Rehab Potential Good   PT Frequency 2x / week   PT Duration 6 weeks   PT Treatment/Interventions ADLs/Self Care Home Management;Electrical Stimulation;Moist Heat;Therapeutic exercise;Therapeutic activities;Functional mobility training;Ultrasound;Traction;Manual techniques   PT Next Visit Plan begin prone exercises including SLR, knee flexion and hip IR/ER ; supine trunk rotation.   PT Home Exercise Plan given       Patient will benefit from skilled therapeutic intervention in order to improve the following deficits and impairments:  Abnormal gait, Decreased activity tolerance, Decreased range of motion, Decreased mobility, Decreased strength, Difficulty walking, Pain  Visit Diagnosis: Lumbago with sciatica, left side  Muscle weakness (generalized)  Abnormal posture     Problem List Patient Active Problem List   Diagnosis Date Noted  . PSVT (paroxysmal supraventricular tachycardia) ([place]) 02/17/2015  . Back pain 07/22/2012  . Acute pain of left hip 07/22/2012  . Stricture and stenosis of esophagus 09/18/2011  . Personal history of colonic polyps 09/18/2011  . COPD (chronic obstructive pulmonary disease) ([place]) 07/14/2010  . Generalized headaches 07/14/2010  . Osteopenia 07/14/2010  . Hypothyroid 07/14/2010  . Elevated lipids 07/14/2010    3:38 PM,08/16/15 Mary Blevins PT, DPT [facility] [address] [place], [place], [address] Phone: [phone]   Fax:  [phone]  Name: Mary Blevins MRN: [id number] Date of Birth: 1945/08/07

## 2015-08-17 ENCOUNTER — Telehealth (HOSPITAL_COMMUNITY): Payer: Self-pay

## 2015-08-17 NOTE — Telephone Encounter (Signed)
She had death in the family

## 2015-08-20 ENCOUNTER — Encounter (HOSPITAL_COMMUNITY): Payer: Medicare Other

## 2015-08-23 ENCOUNTER — Telehealth (HOSPITAL_COMMUNITY): Payer: Self-pay

## 2015-08-23 ENCOUNTER — Ambulatory Visit (HOSPITAL_COMMUNITY): Payer: Medicare Other | Admitting: Physical Therapy

## 2015-08-27 ENCOUNTER — Ambulatory Visit (HOSPITAL_COMMUNITY): Payer: Medicare Other | Attending: Family Medicine | Admitting: Physical Therapy

## 2015-08-27 NOTE — Telephone Encounter (Signed)
cx

## 2015-08-27 NOTE — Telephone Encounter (Signed)
Called pt re: missed appointment.  No answer but message left that her next appointment would be on 08/31/2014 at 10:30 and to please call if she can not make this appointment.  Mary Blevins, [place] CLT [phone]

## 2015-08-31 ENCOUNTER — Telehealth (HOSPITAL_COMMUNITY): Payer: Self-pay

## 2015-08-31 ENCOUNTER — Ambulatory Visit (HOSPITAL_COMMUNITY): Payer: Medicare Other

## 2015-08-31 NOTE — Telephone Encounter (Signed)
No show, called and left message about missed apt.  Included no show policy in message reminded next apt date and time with contact info given.  [address], [place]; [facility]IS [phone]

## 2015-09-02 ENCOUNTER — Telehealth (HOSPITAL_COMMUNITY): Payer: Self-pay

## 2015-09-02 ENCOUNTER — Ambulatory Visit (HOSPITAL_COMMUNITY): Payer: Medicare Other

## 2015-09-02 NOTE — Telephone Encounter (Signed)
No show, called and spoke to pt who stated she is unable to come due to financial concerns.  Pt stated she has tried to call for assistance but unable to find any assistance.  Pt reports she has been compliant with her HEP though does continue to have lower back pain.  Pt wishes to be discharged.    [address], [place]; CBIS [phone]

## 2015-09-07 ENCOUNTER — Encounter (HOSPITAL_COMMUNITY): Payer: Medicare Other | Admitting: Physical Therapy

## 2015-09-09 ENCOUNTER — Encounter (HOSPITAL_COMMUNITY): Payer: Medicare Other

## 2015-09-14 ENCOUNTER — Ambulatory Visit (HOSPITAL_COMMUNITY): Payer: Medicare Other | Admitting: Physical Therapy

## 2015-09-17 ENCOUNTER — Encounter (HOSPITAL_COMMUNITY): Payer: Medicare Other | Admitting: Physical Therapy

## 2015-10-21 ENCOUNTER — Telehealth: Payer: Self-pay | Admitting: Family Medicine

## 2015-10-21 NOTE — Telephone Encounter (Signed)
Samples left up front - pt aware to pu

## 2015-10-21 NOTE — Telephone Encounter (Signed)
Patient called requesting samples of Symbicort 160-4.5 she states she is on her last box. She left VM at 6 pm last night. She also wanted to say thank you for all your help.  CB# [phone]

## 2015-10-28 ENCOUNTER — Ambulatory Visit (INDEPENDENT_AMBULATORY_CARE_PROVIDER_SITE_OTHER): Payer: Medicare Other | Admitting: *Deleted

## 2015-10-28 DIAGNOSIS — Z23 Encounter for immunization: Secondary | ICD-10-CM | POA: Diagnosis not present

## 2015-10-28 NOTE — Progress Notes (Signed)
Patient seen in office for Influenza Vaccination.   Tolerated IM administration well.   Immunization history updated.  

## 2015-10-29 ENCOUNTER — Other Ambulatory Visit: Payer: Self-pay | Admitting: Family Medicine

## 2015-10-29 NOTE — Telephone Encounter (Signed)
Ok to refill 

## 2015-10-29 NOTE — Telephone Encounter (Signed)
ok 

## 2015-11-01 NOTE — Telephone Encounter (Signed)
ok 

## 2015-11-04 ENCOUNTER — Encounter: Payer: Self-pay | Admitting: Family Medicine

## 2015-11-04 ENCOUNTER — Ambulatory Visit (INDEPENDENT_AMBULATORY_CARE_PROVIDER_SITE_OTHER): Payer: Medicare Other | Admitting: Family Medicine

## 2015-11-04 VITALS — BP 80/40 | HR 78 | Temp 98.8°F | Resp 16 | Ht 65.5 in | Wt 178.0 lb

## 2015-11-04 DIAGNOSIS — E038 Other specified hypothyroidism: Secondary | ICD-10-CM | POA: Diagnosis not present

## 2015-11-04 DIAGNOSIS — M48061 Spinal stenosis, lumbar region without neurogenic claudication: Secondary | ICD-10-CM | POA: Diagnosis not present

## 2015-11-04 LAB — COMPLETE METABOLIC PANEL WITH GFR
ALT: 14 U/L (ref 6–29)
AST: 19 U/L (ref 10–35)
Albumin: 4 g/dL (ref 3.6–5.1)
Alkaline Phosphatase: 53 U/L (ref 33–130)
BUN: 9 mg/dL (ref 7–25)
CHLORIDE: 102 mmol/L (ref 98–110)
CO2: 26 mmol/L (ref 20–31)
CREATININE: 0.8 mg/dL (ref 0.60–0.93)
Calcium: 9.7 mg/dL (ref 8.6–10.4)
GFR, Est African American: 86 mL/min (ref >=60)
GFR, Est Non African American: 75 mL/min (ref >=60)
Glucose, Bld: 119 mg/dL — ABNORMAL HIGH (ref 70–99)
Potassium: 4.4 mmol/L (ref 3.5–5.3)
Sodium: 141 mmol/L (ref 135–146)
Total Bilirubin: 0.4 mg/dL (ref 0.2–1.2)
Total Protein: 6.9 g/dL (ref 6.1–8.1)

## 2015-11-04 LAB — CBC WITH DIFFERENTIAL/PLATELET
BASOS PCT: 0 %
Basophils Absolute: 0 cells/uL (ref 0–200)
Eosinophils Absolute: 315 cells/uL (ref 15–500)
Eosinophils Relative: 3 %
HEMATOCRIT: 43.5 % (ref 35.0–45.0)
Hemoglobin: 14.3 g/dL (ref 12.0–15.0)
LYMPHS PCT: 33 %
Lymphs Abs: 3465 cells/uL (ref 850–3900)
MCH: 28.4 pg (ref 27.0–33.0)
MCHC: 32.9 g/dL (ref 32.0–36.0)
MCV: 86.3 fL (ref 80.0–100.0)
MONO ABS: 735 {cells}/uL (ref 200–950)
MONOS PCT: 7 %
MPV: 9.1 fL (ref 7.5–12.5)
Neutro Abs: 5985 cells/uL (ref 1500–7800)
Neutrophils Relative %: 57 %
PLATELETS: 369 10*3/uL (ref 140–400)
RBC: 5.04 MIL/uL (ref 3.80–5.10)
RDW: 13.7 % (ref 11.0–15.0)
WBC: 10.5 10*3/uL (ref 3.8–10.8)

## 2015-11-04 LAB — TSH: TSH: 2.04 mIU/L

## 2015-11-04 NOTE — Progress Notes (Signed)
Subjective:    Patient ID: Mary Blevins, female    DOB: 1945-11-27, 70 y.o.   MRN: [id number]  HPI 7/17 Presents today with lower back pain. Pain is located approximately level of L4-L5. It is primarily on the left-hand side. It occasionally radiates into her left thigh and her left gluteus. She had an MRI performed in June which revealed:  Lumbar spondylosis, scoliosis, short pedicles, and degenerative disc disease, causing moderate impingement at L2- 3, L3-4, and L4-5 ; and mild impingement at L5-S1, as detailed above.  She is currently receiving epidural steroid injections. She has received 2 injections with minimal benefit. She is using Naprosyn and Zanaflex as needed for pain. However she continues to have low back pain and is interested in other options.  At that time, my plan was: I recommended a trial of physical therapy. She can continue to take Naprosyn daily for her back pain was Zanaflex as needed for muscle spasms. I will also give her tramadol which she can take at night to help her sleep.  11/04/15 Patient is here today for a second opinion. She is interested in possibly seeing [facility]. She is still having 7 out of 10 low back pain every day between the levels of L2 and L5. She had 2 epidural steroid injections which did not help with her pain at all. She is taking Zanaflex which helps but she is curious if the laser surgery would be of benefit to her. She is also overdue to recheck her TSH for her hypothyroidism. Of note her blood pressure today is low. She denies any bleeding or bruising. She denies any recent infection that would make her dehydrated. She is asymptomatic. Past Medical History:  Diagnosis Date  . Arthritis   . COPD (chronic obstructive pulmonary disease) ([place])   . Elevated lipids   . Generalized headaches   . History of pneumonia   . Hyperlipidemia   . Hypothyroidism   . MVP (mitral valve prolapse)    palpitations  .  Osteopenia   . Sinusitis    Past Surgical History:  Procedure Laterality Date  . DILATION AND CURETTAGE OF UTERUS    . THYROIDECTOMY    . TONSILLECTOMY     Current Outpatient Prescriptions on File Prior to Visit  Medication Sig Dispense Refill  . albuterol (PROVENTIL HFA;VENTOLIN HFA) 108 (90 BASE) MCG/ACT inhaler Inhale 2 puffs into the lungs every 6 (six) hours as needed for wheezing or shortness of breath. 1 Inhaler 0  . albuterol (PROVENTIL) (2.5 MG/3ML) 0.083% nebulizer solution Take 3 mLs (2.5 mg total) by nebulization every 6 (six) hours as needed for wheezing or shortness of breath. 150 mL 1  . aspirin 81 MG tablet Take 81 mg by mouth daily.      . cetirizine (ZYRTEC) 10 MG tablet Take 1 tablet (10 mg total) by mouth daily. 30 tablet 11  . cycloSPORINE (RESTASIS) 0.05 % ophthalmic emulsion Place 1 drop into both eyes 2 (two) times daily.     . fluticasone (FLONASE) 50 MCG/ACT nasal spray USE 2 SPRAYS EACH SIDE ONCE A DAY 48 g 11  . levalbuterol (XOPENEX) 1.25 MG/3ML nebulizer solution Take 1.25 mg by nebulization every 8 (eight) hours as needed for wheezing. 72 mL 12  . levothyroxine (SYNTHROID, LEVOTHROID) 50 MCG tablet TAKE 1 TABLET (50 MCG TOTAL) BY MOUTH DAILY BEFORE BREAKFAST. 30 tablet 11  . omeprazole (PRILOSEC) 20 MG capsule Take 20 mg by mouth daily.    Marland Kitchen  Polyethyl Glycol-Propyl Glycol (SYSTANE) 0.4-0.3 % SOLN Place 1 drop into both eyes daily.     . simvastatin (ZOCOR) 40 MG tablet TAKE 1 TABLET BY MOUTH AT BEDTIME 90 tablet 2  . SYMBICORT 160-4.5 MCG/ACT inhaler INHALE 2 PUFFS INTO THE LUNGS 2 (TWO) TIMES DAILY. 10.2 Inhaler 5  . tiZANidine (ZANAFLEX) 4 MG tablet TAKE 1 TABLET (4 MG TOTAL) BY MOUTH EVERY 6 (SIX) HOURS AS NEEDED FOR MUSCLE SPASMS. 30 tablet 0  . traMADol (ULTRAM) 50 MG tablet Take 2 tablets (100 mg total) by mouth every 6 (six) hours as needed. Take one to two (1 -2) tablets (50 mg to 100 mg total) by mouth every six (6) hours as needed for pain. 60 tablet 0   . zolpidem (AMBIEN) 5 MG tablet Take 5 mg by mouth at bedtime as needed for sleep.     Current Facility-Administered Medications on File Prior to Visit  Medication Dose Route Frequency Provider Last Rate Last Dose  . 0.9 %  sodium chloride infusion  500 mL Intravenous Continuous Inda Castle, MD       Allergies  Allergen Reactions  . Meloxicam Other (See Comments)    BAD DREAMS, SAD THOUGHTS   Social History   Social History  . Marital status: Widowed    Spouse name: N/A  . Number of children: 0  . Years of education: N/A   Occupational History  .  [employer] analyst   Social History Main Topics  . Smoking status: Former Smoker    Quit date: 07/24/2005  . Smokeless tobacco: Never Used  . Alcohol use No  . Drug use: No  . Sexual activity: Not on file   Other Topics Concern  . Not on file   Social History Narrative  . No narrative on file     Review of Systems  All other systems reviewed and are negative.      Objective:   Physical Exam  Constitutional: She appears well-developed and well-nourished.  Cardiovascular: Normal rate and regular rhythm.   Pulmonary/Chest: Effort normal and breath sounds normal.  Musculoskeletal:       Lumbar back: She exhibits decreased range of motion, tenderness, bony tenderness and pain.  Vitals reviewed.         Assessment & Plan:  Other specified hypothyroidism - Plan: CBC with Differential/Platelet, COMPLETE METABOLIC PANEL WITH GFR, TSH  Spinal stenosis of lumbar region without neurogenic claudication I explained to the patient that given the diffuse nature of her mild spondylosis, I do not believe localized surgery similar to the [facility] would provide her significant relief. I certainly would support a second opinion if she were interested but my personal opinion is that the surgery will be of little benefit. While she is here and went to check a CBC CMP and TSH. I recommended that the patient a  more salt over the next few days and check her blood pressure more frequently. If it remains low, I will proceed with an echocardiogram of the heart and also evaluation of her adrenal glands.

## 2015-11-05 ENCOUNTER — Encounter: Payer: Self-pay | Admitting: Gastroenterology

## 2015-11-25 ENCOUNTER — Other Ambulatory Visit: Payer: Self-pay | Admitting: Family Medicine

## 2015-12-03 ENCOUNTER — Other Ambulatory Visit: Payer: Self-pay

## 2015-12-03 ENCOUNTER — Telehealth: Payer: Self-pay

## 2015-12-03 ENCOUNTER — Ambulatory Visit: Payer: Medicare Other | Admitting: *Deleted

## 2015-12-03 VITALS — Ht 65.5 in | Wt 180.4 lb

## 2015-12-03 DIAGNOSIS — Z86010 Personal history of colonic polyps: Secondary | ICD-10-CM

## 2015-12-03 MED ORDER — NA SULFATE-K SULFATE-MG SULF 17.5-3.13-1.6 GM/177ML PO SOLN: 1.0000 | Freq: Once | ORAL | 0 refills | Status: AC

## 2015-12-03 NOTE — Progress Notes (Signed)
Denies allergies to eggs or soy products. Denies complications with sedation or anesthesia. Denies O2 use. Denies use of diet or weight loss medications.  Emmi instructions declined.  

## 2015-12-03 NOTE — Telephone Encounter (Signed)
Received a call from Pre-visit nurse, since patient is on oxygen therapy she will not be able to have her colonoscopy at [facility] and will need to be rescheduled at August. Spoke to patient and she is now scheduled for 12/14 @ [facility] endo to arrive at 8:45 for a 10:15 colonoscopy. Patient has her prep and instructions from her pre-visit appointment.

## 2015-12-07 ENCOUNTER — Telehealth: Payer: Self-pay | Admitting: Gastroenterology

## 2015-12-07 NOTE — Telephone Encounter (Signed)
Could you possibly call this lady - she is [employer] and now we have the vouchers.  I this Linus Orn was the previsit nurse.  Thank you!!!!!!!!!!!!!!!!!!!!!!!!!!!!!!!!!!!!!!!!

## 2015-12-08 NOTE — Telephone Encounter (Signed)
Called [employer] w/ coupon PNM 50$- pt aware- 50 ok with pt.  Gave BIN [id number] PCN [id number] GRP [id number] Person code 01 ID [id number]  Mary Blevins PV

## 2015-12-23 ENCOUNTER — Encounter: Payer: Medicare Other | Admitting: Gastroenterology

## 2015-12-27 ENCOUNTER — Telehealth: Payer: Self-pay | Admitting: Family Medicine

## 2015-12-27 NOTE — Telephone Encounter (Signed)
Patient calling to see if there are any samples of symbicort available  [phone]

## 2015-12-27 NOTE — Telephone Encounter (Signed)
Samples left up front and pt aware 

## 2015-12-30 ENCOUNTER — Ambulatory Visit (INDEPENDENT_AMBULATORY_CARE_PROVIDER_SITE_OTHER): Payer: Medicare Other | Admitting: Physician Assistant

## 2015-12-30 ENCOUNTER — Encounter: Payer: Self-pay | Admitting: Physician Assistant

## 2015-12-30 VITALS — BP 110/62 | HR 90 | Temp 98.1°F | Resp 18 | Wt 176.0 lb

## 2015-12-30 DIAGNOSIS — J439 Emphysema, unspecified: Secondary | ICD-10-CM

## 2015-12-30 MED ORDER — PREDNISONE 20 MG PO TABS: 20.0000 mg | ORAL_TABLET | Freq: Every day | ORAL | 0 refills | Status: DC

## 2015-12-30 NOTE — Progress Notes (Signed)
Patient ID: Mary Blevins MRN: [id number], DOB: 07/23/1945, 70 y.o. Date of Encounter: 12/30/2015, 12:13 PM    Chief Complaint:  Chief Complaint  Patient presents with  . Shortness of Breath    x2days     HPI: 70 y.o. year old female presents with above.   Says " I don't know what it is---when the weather turns cold, I get like this"  Reviewed her chart. Reviewed that she has history of COPD/emphysema and that meds include Symbicort and albuterol.  She states that she has been using the Symbicort twice daily routinely and is compliant with this. Says that she has only had to use the albuterol once and that was yesterday.  Says that the past couple of days were "rough" "but better today."  Says that the past couple of days, just to walk to the kitchen sink and back, felt winded. Says that yesterday she coughed up thick phlegm once and started drinking lots of water to try to keep this loose. Says this is the same amount of phlegm that she usually has.  Says usually when she has bronchitis she coughs up a lot of loose phlegm and she is not having that problem with this episode. Has had no fever. Has had no nasal congestion/head congestion.  No swelling in the legs. No orthopnea.     Home Meds:   Outpatient Medications Prior to Visit  Medication Sig Dispense Refill  . albuterol (PROVENTIL HFA;VENTOLIN HFA) 108 (90 BASE) MCG/ACT inhaler Inhale 2 puffs into the lungs every 6 (six) hours as needed for wheezing or shortness of breath. 1 Inhaler 0  . aspirin 81 MG tablet Take 81 mg by mouth daily.      . budesonide-formoterol (SYMBICORT) 160-4.5 MCG/ACT inhaler INHALE 2 PUFFS INTO THE LUNGS 2 (TWO) TIMES DAILY. 10.2 Inhaler 2  . cetirizine (ZYRTEC) 10 MG tablet Take 1 tablet (10 mg total) by mouth daily. 30 tablet 11  . cycloSPORINE (RESTASIS) 0.05 % ophthalmic emulsion Place 1 drop into both eyes 2 (two) times daily.     . fluticasone (FLONASE) 50 MCG/ACT nasal spray USE 2  SPRAYS EACH SIDE ONCE A DAY 48 g 11  . levalbuterol (XOPENEX) 1.25 MG/3ML nebulizer solution Take 1.25 mg by nebulization every 8 (eight) hours as needed for wheezing. 72 mL 12  . levothyroxine (SYNTHROID, LEVOTHROID) 50 MCG tablet TAKE 1 TABLET (50 MCG TOTAL) BY MOUTH DAILY BEFORE BREAKFAST. 30 tablet 11  . naproxen (NAPROSYN) 250 MG tablet Take by mouth as needed.    Marland Kitchen omeprazole (PRILOSEC) 20 MG capsule Take 20 mg by mouth daily.    . OXYGEN Inhale 2 L into the lungs as needed.    Vladimir Faster Glycol-Propyl Glycol (SYSTANE) 0.4-0.3 % SOLN Place 1 drop into both eyes daily.     . simvastatin (ZOCOR) 40 MG tablet TAKE 1 TABLET BY MOUTH AT BEDTIME 90 tablet 2  . tiZANidine (ZANAFLEX) 4 MG tablet TAKE 1 TABLET (4 MG TOTAL) BY MOUTH EVERY 6 (SIX) HOURS AS NEEDED FOR MUSCLE SPASMS. 30 tablet 0  . zolpidem (AMBIEN) 5 MG tablet Take 5 mg by mouth at bedtime as needed for sleep.     Facility-Administered Medications Prior to Visit  Medication Dose Route Frequency Provider Last Rate Last Dose  . 0.9 %  sodium chloride infusion  500 mL Intravenous Continuous Inda Castle, MD        Allergies:  Allergies  Allergen Reactions  . Meloxicam Other (See Comments)  BAD DREAMS, SAD THOUGHTS      Review of Systems: See HPI for pertinent ROS. All other ROS negative.    Physical Exam: Blood pressure 110/62, pulse 90, temperature 98.1 F (36.7 C), temperature source Oral, resp. rate 18, weight 176 lb (79.8 kg), SpO2 98 %., Body mass index is 28.84 kg/m. General: WNWD Female.  Appears in no acute distress. HEENT: Normocephalic, atraumatic, eyes without discharge, sclera non-icteric, nares are without discharge. Bilateral auditory canals clear, TM's are without perforation, pearly grey and translucent with reflective cone of light bilaterally. Oral cavity moist, posterior pharynx without exudate, erythema, peritonsillar abscess, or post nasal drip.  Neck: Supple. No thyromegaly. No  lymphadenopathy. Lungs: Clear bilaterally to auscultation without wheezes, rales, or rhonchi. Breathing is unlabored. Heart: Regular rhythm. No murmurs, rubs, or gallops. Msk:  Strength and tone normal for age. Extremities/Skin: Warm and dry.  Neuro: Alert and oriented X 3. Moves all extremities spontaneously. Gait is normal. CNII-XII grossly in tact. Psych:  Responds to questions appropriately with a normal affect.     ASSESSMENT AND PLAN:  70 y.o. year old female with  1. Pulmonary emphysema, unspecified emphysema type ([place]) Currently on exam lungs are clear with no wheezing. Currently oxygen saturation 98% on room air. However given the symptoms that she is reporting I will add low-dose prednisone 20 mg to take daily for 5 days. At this point I do not think she needs antibiotic. Told her to call back if she starts to develop increased amount of phlegm , chest congestion or fever. Also call back if feels that she is still having wheezing , shortness of breath. She voices understanding and agrees. - predniSONE (DELTASONE) 20 MG tablet; Take 1 tablet (20 mg total) by mouth daily with breakfast.  Dispense: 5 tablet; Refill: 0   Signed, [address] [place], [place], [facility] 12/30/2015 12:13 PM

## 2016-01-03 ENCOUNTER — Encounter (HOSPITAL_COMMUNITY): Payer: Self-pay

## 2016-01-06 ENCOUNTER — Ambulatory Visit (HOSPITAL_COMMUNITY): Payer: Medicare Other | Admitting: Anesthesiology

## 2016-01-06 ENCOUNTER — Encounter (HOSPITAL_COMMUNITY): Payer: Self-pay

## 2016-01-06 ENCOUNTER — Ambulatory Visit (HOSPITAL_COMMUNITY)
Admission: RE | Admit: 2016-01-06 | Discharge: 2016-01-06 | Disposition: A | Discharge status: Home / Self Care | Payer: Medicare Other | Source: Ambulatory Visit | Attending: Gastroenterology | Admitting: Gastroenterology

## 2016-01-06 ENCOUNTER — Encounter (HOSPITAL_COMMUNITY)
Admission: RE | Disposition: A | Discharge status: Home / Self Care | Payer: Self-pay | Source: Ambulatory Visit | Attending: Gastroenterology

## 2016-01-06 DIAGNOSIS — K640 First degree hemorrhoids: Secondary | ICD-10-CM

## 2016-01-06 DIAGNOSIS — Z87891 Personal history of nicotine dependence: Secondary | ICD-10-CM | POA: Insufficient documentation

## 2016-01-06 DIAGNOSIS — Z86010 Personal history of colonic polyps: Secondary | ICD-10-CM

## 2016-01-06 DIAGNOSIS — M8580 Other specified disorders of bone density and structure, unspecified site: Secondary | ICD-10-CM | POA: Insufficient documentation

## 2016-01-06 DIAGNOSIS — E039 Hypothyroidism, unspecified: Secondary | ICD-10-CM | POA: Diagnosis not present

## 2016-01-06 DIAGNOSIS — Z1211 Encounter for screening for malignant neoplasm of colon: Secondary | ICD-10-CM | POA: Diagnosis present

## 2016-01-06 DIAGNOSIS — D123 Benign neoplasm of transverse colon: Secondary | ICD-10-CM

## 2016-01-06 DIAGNOSIS — J449 Chronic obstructive pulmonary disease, unspecified: Secondary | ICD-10-CM | POA: Insufficient documentation

## 2016-01-06 DIAGNOSIS — K5730 Diverticulosis of large intestine without perforation or abscess without bleeding: Secondary | ICD-10-CM | POA: Diagnosis not present

## 2016-01-06 DIAGNOSIS — E785 Hyperlipidemia, unspecified: Secondary | ICD-10-CM | POA: Insufficient documentation

## 2016-01-06 DIAGNOSIS — M1990 Unspecified osteoarthritis, unspecified site: Secondary | ICD-10-CM | POA: Insufficient documentation

## 2016-01-06 DIAGNOSIS — I341 Nonrheumatic mitral (valve) prolapse: Secondary | ICD-10-CM | POA: Insufficient documentation

## 2016-01-06 DIAGNOSIS — D125 Benign neoplasm of sigmoid colon: Secondary | ICD-10-CM

## 2016-01-06 DIAGNOSIS — D122 Benign neoplasm of ascending colon: Secondary | ICD-10-CM | POA: Diagnosis not present

## 2016-01-06 HISTORY — PX: COLONOSCOPY WITH PROPOFOL: SHX5780

## 2016-01-06 SURGERY — COLONOSCOPY WITH PROPOFOL
Anesthesia: Monitor Anesthesia Care

## 2016-01-06 MED ORDER — PROPOFOL 10 MG/ML IV BOLUS
INTRAVENOUS | Status: DC | PRN
  Administered 2016-01-06 (×4): 20 mg via INTRAVENOUS
  Administered 2016-01-06: 40 mg via INTRAVENOUS
  Administered 2016-01-06 (×2): 20 mg via INTRAVENOUS

## 2016-01-06 MED ORDER — LACTATED RINGERS IV SOLN
INTRAVENOUS | Status: DC
  Administered 2016-01-06: 09:00:00 via INTRAVENOUS

## 2016-01-06 MED ORDER — PROPOFOL 500 MG/50ML IV EMUL
INTRAVENOUS | Status: DC | PRN
  Administered 2016-01-06: 75 ug/kg/min via INTRAVENOUS

## 2016-01-06 MED ORDER — PROPOFOL 10 MG/ML IV BOLUS
INTRAVENOUS | Status: AC
  Filled 2016-01-06: qty 20

## 2016-01-06 MED ORDER — SODIUM CHLORIDE 0.9 % IV SOLN: INTRAVENOUS | Status: DC

## 2016-01-06 SURGICAL SUPPLY — 22 items

## 2016-01-06 NOTE — Discharge Instructions (Addendum)
YOU HAD AN ENDOSCOPIC PROCEDURE TODAY: Refer to the procedure report and other information in the discharge instructions given to you for any specific questions about what was found during the examination. If this information does not answer your questions, please call [place] office at [phone] to clarify.   YOU SHOULD EXPECT: Some feelings of bloating in the abdomen. Passage of more gas than usual. Walking can help get rid of the air that was put into your GI tract during the procedure and reduce the bloating. If you had a lower endoscopy (such as a colonoscopy or flexible sigmoidoscopy) you may notice spotting of blood in your stool or on the toilet paper. Some abdominal soreness may be present for a day or two, also.  DIET: Your first meal following the procedure should be a light meal and then it is ok to progress to your normal diet. A half-sandwich or bowl of soup is an example of a good first meal. Heavy or fried foods are harder to digest and may make you feel nauseous or bloated. Drink plenty of fluids but you should avoid alcoholic beverages for 24 hours. If you had a esophageal dilation, please see attached instructions for diet.    ACTIVITY: Your care partner should take you home directly after the procedure. You should plan to take it easy, moving slowly for the rest of the day. You can resume normal activity the day after the procedure however YOU SHOULD NOT DRIVE, use power tools, machinery or perform tasks that involve climbing or major physical exertion for 24 hours (because of the sedation medicines used during the test).   SYMPTOMS TO REPORT IMMEDIATELY: A gastroenterologist can be reached at any hour. Please call [phone]  for any of the following symptoms:  Following lower endoscopy (colonoscopy, flexible sigmoidoscopy) Excessive amounts of blood in the stool  Significant tenderness, worsening of abdominal pains  Swelling of the abdomen that is new, acute  Fever of 100 or  higher  Following upper endoscopy (EGD, EUS, ERCP, esophageal dilation) Vomiting of blood or coffee ground material  New, significant abdominal pain  New, significant chest pain or pain under the shoulder blades  Painful or persistently difficult swallowing  New shortness of breath  Black, tarry-looking or red, bloody stools    PLEASE DO NOT TAKE ASPIRIN FOR THE NEXT 5 DAYS  FOLLOW UP:  If any biopsies were taken you will be contacted by phone or by letter within the next 1-3 weeks. Call [phone]  if you have not heard about the biopsies in 3 weeks.  Please also call with any specific questions about appointments or follow up tests.

## 2016-01-06 NOTE — Anesthesia Preprocedure Evaluation (Signed)
Anesthesia Evaluation  Patient identified by MRN, date of birth, ID band Patient awake    Reviewed: Allergy & Precautions, NPO status , Patient's Chart, lab work & pertinent test results  Airway Mallampati: II  TM Distance: >3 FB Neck ROM: Full    Dental no notable dental hx.    Pulmonary COPD, former smoker,    Pulmonary exam normal breath sounds clear to auscultation       Cardiovascular negative cardio ROS Normal cardiovascular exam Rhythm:Regular Rate:Normal     Neuro/Psych negative neurological ROS  negative psych ROS   GI/Hepatic negative GI ROS, Neg liver ROS,   Endo/Other  Hypothyroidism   Renal/GU negative Renal ROS  negative genitourinary   Musculoskeletal negative musculoskeletal ROS (+)   Abdominal   Peds negative pediatric ROS (+)  Hematology negative hematology ROS (+)   Anesthesia Other Findings   Reproductive/Obstetrics negative OB ROS                             Anesthesia Physical Anesthesia Plan  ASA: II  Anesthesia Plan: MAC   Post-op Pain Management:    Induction: Intravenous  Airway Management Planned: Nasal Cannula  Additional Equipment:   Intra-op Plan:   Post-operative Plan:   Informed Consent: I have reviewed the patients History and Physical, chart, labs and discussed the procedure including the risks, benefits and alternatives for the proposed anesthesia with the patient or authorized representative who has indicated his/her understanding and acceptance.   Dental advisory given  Plan Discussed with: CRNA and Surgeon  Anesthesia Plan Comments:         Anesthesia Quick Evaluation

## 2016-01-06 NOTE — Interval H&P Note (Signed)
History and Physical Interval Note:  01/06/2016 9:46 AM  Mary Blevins  has presented today for surgery, with the diagnosis of Recall Colonoscopy, hx of polyps  The various methods of treatment have been discussed with the patient and family. After consideration of risks, benefits and other options for treatment, the patient has consented to  Procedure(s): COLONOSCOPY WITH PROPOFOL (N/A) as a surgical intervention .  The patient's history has been reviewed, patient examined, no change in status, stable for surgery.  I have reviewed the patient's chart and labs.  Questions were answered to the patient's satisfaction.     Nelida Meuse III

## 2016-01-06 NOTE — Anesthesia Postprocedure Evaluation (Signed)
Anesthesia Post Note  Patient: Mary Blevins  Procedure(s) Performed: Procedure(s) (LRB): COLONOSCOPY WITH PROPOFOL (N/A)  Patient location during evaluation: PACU Anesthesia Type: MAC Level of consciousness: awake and alert Pain management: pain level controlled Vital Signs Assessment: post-procedure vital signs reviewed and stable Respiratory status: spontaneous breathing, nonlabored ventilation, respiratory function stable and patient connected to nasal cannula oxygen Cardiovascular status: stable and blood pressure returned to baseline Anesthetic complications: no    Last Vitals:  Vitals:   01/06/16 0858 01/06/16 1029  BP: 130/79 (!) 111/57  Pulse: 79 75  Resp: 15 16  Temp: 36.7 C     Last Pain:  Vitals:   01/06/16 0858  TempSrc: Oral                 Ysmael Hires S

## 2016-01-06 NOTE — H&P (Signed)
History:  This patient presents for endoscopic testing for history of colon polyps.  Mary Blevins Referring physician: Odette Fraction, MD  Past Medical History: Past Medical History:  Diagnosis Date  . Arthritis   . COPD (chronic obstructive pulmonary disease) ([place])   . Elevated lipids   . Generalized headaches   . History of pneumonia   . Hyperlipidemia   . Hypothyroidism   . MVP (mitral valve prolapse)    palpitations  . Osteopenia   . Seasonal allergies   . Sinusitis      Past Surgical History: Past Surgical History:  Procedure Laterality Date  . DILATION AND CURETTAGE OF UTERUS    . THYROIDECTOMY    . TONSILLECTOMY      Allergies: Allergies  Allergen Reactions  . Meloxicam Other (See Comments)    BAD DREAMS, SAD THOUGHTS    Outpatient Meds: Current Facility-Administered Medications  Medication Dose Route Frequency Provider Last Rate Last Dose  . 0.9 %  sodium chloride infusion   Intravenous Continuous Nelida Meuse III, MD      . lactated ringers infusion   Intravenous Continuous Nelida Meuse III, MD 125 mL/hr at 01/06/16 [id number]        ___________________________________________________________________ Objective   Exam:  BP 130/79   Pulse 79   Temp 98.1 F (36.7 C) (Oral)   Resp 15   Ht 5' 5.5" (1.664 m)   Wt 176 lb (79.8 kg)   SpO2 98%   BMI 28.84 kg/m    CV: RRR without murmur, S1/S2, no JVD, no peripheral edema  Resp: clear to auscultation bilaterally, normal RR and effort noted  GI: soft, no tenderness, with active bowel sounds. No guarding or palpable organomegaly noted.  Neuro: awake, alert and oriented x 3. Normal gross motor function and fluent speech   Assessment:  Hx colon polyps (5mm TA in July 2012)  Plan:  colonoscopy   Nelida Meuse III

## 2016-01-06 NOTE — Transfer of Care (Signed)
Immediate Anesthesia Transfer of Care Note  Patient: Mary Blevins  Procedure(s) Performed: Procedure(s): COLONOSCOPY WITH PROPOFOL (N/A)  Patient Location: PACU  Anesthesia Type:MAC  Level of Consciousness:  sedated, patient cooperative and responds to stimulation  Airway & Oxygen Therapy:Patient Spontanous Breathing and Patient connected to face mask oxgen  Post-op Assessment:  Report given to PACU RN and Post -op Vital signs reviewed and stable  Post vital signs:  Reviewed and stable  Last Vitals:  Vitals:   01/06/16 0858 01/06/16 1029  BP: 130/79 (!) 111/57  Pulse: 79 75  Resp: 15 16  Temp: 123XX123 C     Complications: No apparent anesthesia complications

## 2016-01-06 NOTE — Op Note (Addendum)
[facility] Patient Name: Mary Blevins Procedure Date: 01/06/2016 MRN: [id number] Attending MD: Estill Cotta. Loletha Carrow , MD Date of Birth: Jun 01, 1945 CSN: [id number] Age: 70 Admit Type: Outpatient Procedure:                Colonoscopy Indications:              Surveillance: Personal history of adenomatous                            polyps on last colonoscopy > 5 years ago (52mm TA                            07/2010) Providers:                Estill Cotta. Loletha Carrow, MD, Laverta Baltimore RN, RN, Corliss Parish, Technician Referring MD:             Cammie Mcgee. [place] Medicines:                Monitored Anesthesia Care Complications:            No immediate complications. Estimated Blood Loss:     Estimated blood loss was minimal. Procedure:                Pre-Anesthesia Assessment:                           - Prior to the procedure, a History and Physical                            was performed, and patient medications and                            allergies were reviewed. The patient's tolerance of                            previous anesthesia was also reviewed. The risks                            and benefits of the procedure and the sedation                            options and risks were discussed with the patient.                            All questions were answered, and informed consent                            was obtained. Prior Anticoagulants: The patient has                            taken no previous anticoagulant or antiplatelet  agents. ASA Grade Assessment: III - A patient with                            severe systemic disease. After reviewing the risks                            and benefits, the patient was deemed in                            satisfactory condition to undergo the procedure.                           After obtaining informed consent, the colonoscope                            was passed  under direct vision. Throughout the                            procedure, the patient's blood pressure, pulse, and                            oxygen saturations were monitored continuously. The                            EC-[id number]) scope was introduced through                            the anus and advanced to the the cecum, identified                            by appendiceal orifice and ileocecal valve. The                            colonoscopy was performed without difficulty. The                            patient tolerated the procedure well. The quality                            of the bowel preparation was excellent. The                            ileocecal valve, appendiceal orifice, and rectum                            were photographed. The quality of the bowel                            preparation was evaluated using the BBPS [facility]                            Bowel Preparation Scale) with scores of: Right  Colon = 3, Transverse Colon = 3 and Left Colon = 3                            (entire mucosa seen well with no residual staining,                            small fragments of stool or opaque liquid). The                            total BBPS score equals 9. The bowel preparation                            used was SUPREP. Scope In: 10:00:10 AM Scope Out: 10:45:50 AM Scope Withdrawal Time: 0 hours 41 minutes 5 seconds  Total Procedure Duration: 0 hours 45 minutes 40 seconds  Findings:      A 10 mm polyp was found in the proximal ascending colon (on the proximal       aspect of a fold adjacent to the ICV). The polyp was sessile. The polyp       was removed with a piecemeal technique using a hot snare. Resection and       retrieval were complete.      Three sessile polyps were found in the mid sigmoid colon and proximal       transverse colon. The polyps were 4 mm in size. These polyps were       removed with a cold snare. Resection was  complete, but the polyp tissue       was only partially retrieved.      Multiple medium-mouthed diverticula were found in the left colon.      Internal hemorrhoids were found during retroflexion and during anoscopy.       The hemorrhoids were Grade I (internal hemorrhoids that do not prolapse).      The exam was otherwise without abnormality. Impression:               - One 10 mm polyp in the proximal ascending colon,                            removed piecemeal using a hot snare. Resected and                            retrieved.                           - Three 4 mm polyps in the mid sigmoid colon and in                            the proximal transverse colon, removed with a cold                            snare. Complete resection. Partial retrieval.                           - Diverticulosis in the left colon.                           -  Internal hemorrhoids.                           - The examination was otherwise normal. Moderate Sedation:      MAC sedation used Recommendation:           - Patient has a contact number available for                            emergencies. The signs and symptoms of potential                            delayed complications were discussed with the                            patient. Return to normal activities tomorrow.                            Written discharge instructions were provided to the                            patient.                           - Resume previous diet.                           - No aspirin, ibuprofen, naproxen, or other                            non-steroidal anti-inflammatory drugs for 5 days                            after polyp removal.                           - Await pathology results.                           - Repeat colonoscopy is recommended for                            surveillance. The colonoscopy date will be                            determined after pathology results from today's                             exam become available for review. Procedure Code(s):        --- Professional ---                           [phone], Colonoscopy, flexible; with removal of                            tumor(s), polyp(s), or other lesion(s) by snare  technique Diagnosis Code(s):        --- Professional ---                           Z86.010, Personal history of colonic polyps                           D12.2, Benign neoplasm of ascending colon                           D12.5, Benign neoplasm of sigmoid colon                           D12.3, Benign neoplasm of transverse colon (hepatic                            flexure or splenic flexure)                           K64.0, First degree hemorrhoids                           K57.30, Diverticulosis of large intestine without                            perforation or abscess without bleeding CPT copyright 2016 American Medical Association. All rights reserved. The codes documented in this report are preliminary and upon coder review may  be revised to meet current compliance requirements. Drinda Belgard L. Loletha Carrow, MD 01/06/2016 10:38:56 AM This report has been signed electronically. Number of Addenda: 0

## 2016-01-07 ENCOUNTER — Encounter (HOSPITAL_COMMUNITY): Payer: Self-pay | Admitting: Gastroenterology

## 2016-01-10 ENCOUNTER — Encounter: Payer: Self-pay | Admitting: Gastroenterology

## 2016-01-10 ENCOUNTER — Encounter: Payer: Self-pay | Admitting: Family Medicine

## 2016-01-10 NOTE — Progress Notes (Signed)
Letter mailed

## 2016-01-25 ENCOUNTER — Ambulatory Visit (INDEPENDENT_AMBULATORY_CARE_PROVIDER_SITE_OTHER): Payer: Medicare HMO | Admitting: Family Medicine

## 2016-01-25 ENCOUNTER — Encounter: Payer: Self-pay | Admitting: Family Medicine

## 2016-01-25 VITALS — BP 112/76 | HR 88 | Temp 98.8°F | Resp 18 | Ht 65.5 in | Wt 179.0 lb

## 2016-01-25 DIAGNOSIS — J069 Acute upper respiratory infection, unspecified: Secondary | ICD-10-CM | POA: Diagnosis not present

## 2016-01-25 MED ORDER — HYDROCODONE-HOMATROPINE 5-1.5 MG/5ML PO SYRP: 5.0000 mL | ORAL_SOLUTION | Freq: Three times a day (TID) | ORAL | 0 refills | Status: DC | PRN

## 2016-01-25 MED ORDER — PREDNISONE 20 MG PO TABS: ORAL_TABLET | ORAL | 0 refills | Status: DC

## 2016-01-25 NOTE — Progress Notes (Signed)
Subjective:    Patient ID: Mary Blevins, female    DOB: [birth year], 71 y.o.   MRN: [id number]  HPI  Symptoms began 4 days ago with head congestion, rhinorrhea, cough. This has exacerbated her emphysema and caused her to wheeze. Cough is nonproductive. She denies any shortness of breath. She denies any purulent sputum. She does have some mild pleurisy secondary to coughing. She denies any sinus pain or otalgia or sore throat Past Medical History:  Diagnosis Date  . Arthritis   . COPD (chronic obstructive pulmonary disease) ([place])   . Elevated lipids   . Generalized headaches   . History of pneumonia   . Hyperlipidemia   . Hypothyroidism   . MVP (mitral valve prolapse)    palpitations  . Osteopenia   . Seasonal allergies   . Sinusitis   . Tubular adenoma of colon    Past Surgical History:  Procedure Laterality Date  . COLONOSCOPY WITH PROPOFOL N/A 01/06/2016   Procedure: COLONOSCOPY WITH PROPOFOL;  Surgeon: Doran Stabler, MD;  Location: [facility];  Service: Gastroenterology;  Laterality: N/A;  . DILATION AND CURETTAGE OF UTERUS    . THYROIDECTOMY    . TONSILLECTOMY     Current Outpatient Prescriptions on File Prior to Visit  Medication Sig Dispense Refill  . albuterol (PROVENTIL HFA;VENTOLIN HFA) 108 (90 BASE) MCG/ACT inhaler Inhale 2 puffs into the lungs every 6 (six) hours as needed for wheezing or shortness of breath. 1 Inhaler 0  . aspirin 81 MG tablet Take 81 mg by mouth daily.      . budesonide-formoterol (SYMBICORT) 160-4.5 MCG/ACT inhaler INHALE 2 PUFFS INTO THE LUNGS 2 (TWO) TIMES DAILY. 10.2 Inhaler 2  . cetirizine (ZYRTEC) 10 MG tablet Take 1 tablet (10 mg total) by mouth daily. 30 tablet 11  . cycloSPORINE (RESTASIS) 0.05 % ophthalmic emulsion Place 1 drop into both eyes 2 (two) times daily.     . fluticasone (FLONASE) 50 MCG/ACT nasal spray USE 2 SPRAYS EACH SIDE ONCE A DAY 48 g 11  . levalbuterol (XOPENEX) 1.25 MG/3ML nebulizer solution Take 1.25 mg  by nebulization every 8 (eight) hours as needed for wheezing. 72 mL 12  . levothyroxine (SYNTHROID, LEVOTHROID) 50 MCG tablet TAKE 1 TABLET (50 MCG TOTAL) BY MOUTH DAILY BEFORE BREAKFAST. 30 tablet 11  . naproxen (NAPROSYN) 250 MG tablet Take 250 mg by mouth 2 (two) times daily as needed for mild pain.     Marland Kitchen omeprazole (PRILOSEC) 20 MG capsule Take 20 mg by mouth daily as needed.     . OXYGEN Inhale 2 L into the lungs as needed.    Vladimir Faster Glycol-Propyl Glycol (SYSTANE) 0.4-0.3 % SOLN Place 1 drop into both eyes daily.     . simvastatin (ZOCOR) 40 MG tablet TAKE 1 TABLET BY MOUTH AT BEDTIME 90 tablet 2  . tiZANidine (ZANAFLEX) 4 MG tablet TAKE 1 TABLET (4 MG TOTAL) BY MOUTH EVERY 6 (SIX) HOURS AS NEEDED FOR MUSCLE SPASMS. 30 tablet 0  . zolpidem (AMBIEN) 5 MG tablet Take 5 mg by mouth at bedtime as needed for sleep.     No current facility-administered medications on file prior to visit.    Allergies  Allergen Reactions  . Meloxicam Other (See Comments)    BAD DREAMS, SAD THOUGHTS   Social History   Social History  . Marital status: Widowed    Spouse name: N/A  . Number of children: 0  . 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 Smoking status: Former Smoker    Quit date: 07/24/2005  . Smokeless tobacco: Never Used  . Alcohol use Yes     Comment: rare  . Drug use: No  . Sexual activity: Not on file   Other Topics Concern  . Not on file   Social History Narrative  . No narrative on file     Review of Systems  All other systems reviewed and are negative.      Objective:   Physical Exam  Constitutional: She appears well-developed and well-nourished. No distress.  HENT:  Right Ear: External ear normal.  Left Ear: External ear normal.  Nose: Nose normal.  Mouth/Throat: Oropharynx is clear and moist. No oropharyngeal exudate.  Eyes: Conjunctivae are normal.  Neck: Neck supple.  Cardiovascular:  Normal rate, regular rhythm and normal heart sounds.   Pulmonary/Chest: Effort normal. She has wheezes.  Abdominal: Soft. Bowel sounds are normal.  Lymphadenopathy:    She has no cervical adenopathy.  Skin: She is not diaphoretic.  Vitals reviewed.         Assessment & Plan:  URI, acute - Plan: predniSONE (DELTASONE) 20 MG tablet, HYDROcodone-homatropine (HYCODAN) 5-1.5 MG/5ML syrup  Patient has a viral upper respiratory infection. I recommended tincture of time. Symptoms should improve in next 3-4 days. Should her wheezing worsen, I would want  her to start taking a prednisone taper pack in addition to her Combivent 2 puffs inhaled every 6 hours. I did give her Hycodan to take for cough at night. She is to call me back immediately should she develop purulent sputum or fever as I would have a low threshold for starting her on an antibiotic given her COPD

## 2016-02-01 ENCOUNTER — Telehealth: Payer: Self-pay | Admitting: Family Medicine

## 2016-02-01 MED ORDER — BENZONATATE 200 MG PO CAPS: 200.0000 mg | ORAL_CAPSULE | Freq: Three times a day (TID) | ORAL | 0 refills | Status: DC | PRN

## 2016-02-01 NOTE — Telephone Encounter (Signed)
Pt was seen in office 1-2 and was given Rx had since started to feel better. She went out 1-8 to run errands  and feels her symptoms has since started bck up,her cough is better.    Pt states she has tightness in her chest,a little wheezing, head is stopped up, eyes watery, still coughing up mucus Pt states she feels sluggish, Pt is asking if RX can be called in Pls advise

## 2016-02-01 NOTE — Telephone Encounter (Signed)
Pt aware.

## 2016-02-01 NOTE — Telephone Encounter (Signed)
Patient is calling about the last visit she had with [place], says she is still having discomfort in her chest from the congestion would like a call back at [phone]

## 2016-02-01 NOTE — Telephone Encounter (Signed)
mucinex for congestion, tessalon 200 mg poq8hrs prn cough (30).  Can use albuterol 2 puff inh q 6 hrs prn wheezing.

## 2016-03-04 ENCOUNTER — Other Ambulatory Visit: Payer: Self-pay | Admitting: Family Medicine

## 2016-03-13 ENCOUNTER — Telehealth: Payer: Self-pay | Admitting: Family Medicine

## 2016-03-13 NOTE — Telephone Encounter (Signed)
Pt just wanted to let us know that she had a spell Saturday where she was wheezing and unable to catch her breath. She used her inhaler and was able to get the SOB to subside. Informed pt to continue with the mucinex and use the inhaler q 4-6 hours as needed and if she gets worse or starts a fever we need to see her in office. Pt verbalized understanding.

## 2016-03-13 NOTE — Telephone Encounter (Signed)
Patient is calling to speak with you regarding some wheezing she is having, she left [place] a message on Sunday  [phone]

## 2016-03-20 ENCOUNTER — Telehealth: Payer: Self-pay | Admitting: Family Medicine

## 2016-03-20 NOTE — Telephone Encounter (Signed)
Samples left up front for pt and pt aware

## 2016-03-20 NOTE — Telephone Encounter (Signed)
Patient calling for symbicort samples  [phone]

## 2016-03-22 ENCOUNTER — Telehealth: Payer: Self-pay | Admitting: Family Medicine

## 2016-03-22 NOTE — Telephone Encounter (Signed)
Pt called and LMOVM stating that she is better but still having a dry cough and some wheezing and was wondering what you wanted her to do now? If she needs an appt she will be happy to come in.

## 2016-03-22 NOTE — Telephone Encounter (Signed)
Pt called back and states that she is having some SOB along with the wheezing and cough but no other symptoms - no fever no CP. Pt is going to do a breathing treatment and continue to take Tessalon for cough and pt was instructed to come to office or er if gets worse. Pt verbalized understanding.

## 2016-03-24 ENCOUNTER — Ambulatory Visit (INDEPENDENT_AMBULATORY_CARE_PROVIDER_SITE_OTHER): Payer: Medicare HMO | Admitting: Family Medicine

## 2016-03-24 ENCOUNTER — Encounter: Payer: Self-pay | Admitting: Family Medicine

## 2016-03-24 VITALS — BP 110/78 | HR 80 | Temp 98.2°F | Resp 20 | Ht 65.5 in | Wt 182.0 lb

## 2016-03-24 DIAGNOSIS — J439 Emphysema, unspecified: Secondary | ICD-10-CM | POA: Diagnosis not present

## 2016-03-24 NOTE — Progress Notes (Signed)
Subjective:    Patient ID: Mary Blevins, female    DOB: 03/03/1945, 71 y.o.   MRN: [id number]  HPI Patient states that she has been wheezing ever since I saw her in January. She has to use Combivent twice a day. She is also using Symbicort twice a day as a maintenance medication. She denies any purulent sputum. She denies any color to her sputum. She denies any change in her sputum characteristics. She denies any fevers or chills. She does report dyspnea on exertion. She denies any chest pain or pleurisy or hemoptysis. Past Medical History:  Diagnosis Date  . Arthritis   . COPD (chronic obstructive pulmonary disease) ([place])   . Elevated lipids   . Generalized headaches   . History of pneumonia   . Hyperlipidemia   . Hypothyroidism   . MVP (mitral valve prolapse)    palpitations  . Osteopenia   . Seasonal allergies   . Sinusitis   . Tubular adenoma of colon    Past Surgical History:  Procedure Laterality Date  . COLONOSCOPY WITH PROPOFOL N/A 01/06/2016   Procedure: COLONOSCOPY WITH PROPOFOL;  Surgeon: Doran Stabler, MD;  Location: [facility];  Service: Gastroenterology;  Laterality: N/A;  . DILATION AND CURETTAGE OF UTERUS    . THYROIDECTOMY    . TONSILLECTOMY     Current Outpatient Prescriptions on File Prior to Visit  Medication Sig Dispense Refill  . albuterol (PROVENTIL HFA;VENTOLIN HFA) 108 (90 BASE) MCG/ACT inhaler Inhale 2 puffs into the lungs every 6 (six) hours as needed for wheezing or shortness of breath. 1 Inhaler 0  . aspirin 81 MG tablet Take 81 mg by mouth daily.      . benzonatate (TESSALON) 200 MG capsule Take 1 capsule (200 mg total) by mouth every 8 (eight) hours as needed for cough. 30 capsule 0  . budesonide-formoterol (SYMBICORT) 160-4.5 MCG/ACT inhaler INHALE 2 PUFFS INTO THE LUNGS 2 (TWO) TIMES DAILY. 10.2 Inhaler 2  . cetirizine (ZYRTEC) 10 MG tablet Take 1 tablet (10 mg total) by mouth daily. 30 tablet 11  . cycloSPORINE (RESTASIS) 0.05 %  ophthalmic emulsion Place 1 drop into both eyes 2 (two) times daily.     . fluticasone (FLONASE) 50 MCG/ACT nasal spray USE 2 SPRAYS EACH SIDE ONCE A DAY 48 g 11  . levalbuterol (XOPENEX) 1.25 MG/3ML nebulizer solution Take 1.25 mg by nebulization every 8 (eight) hours as needed for wheezing. 72 mL 12  . levothyroxine (SYNTHROID, LEVOTHROID) 50 MCG tablet TAKE 1 TABLET (50 MCG TOTAL) BY MOUTH DAILY BEFORE BREAKFAST. 30 tablet 11  . naproxen (NAPROSYN) 250 MG tablet Take 250 mg by mouth 2 (two) times daily as needed for mild pain.     Marland Kitchen omeprazole (PRILOSEC) 20 MG capsule Take 20 mg by mouth daily as needed.     . OXYGEN Inhale 2 L into the lungs as needed.    Vladimir Faster Glycol-Propyl Glycol (SYSTANE) 0.4-0.3 % SOLN Place 1 drop into both eyes daily.     . simvastatin (ZOCOR) 40 MG tablet TAKE 1 TABLET BY MOUTH AT BEDTIME 90 tablet 2  . tiZANidine (ZANAFLEX) 4 MG tablet TAKE 1 TABLET (4 MG TOTAL) BY MOUTH EVERY 6 (SIX) HOURS AS NEEDED FOR MUSCLE SPASMS. 30 tablet 0  . zolpidem (AMBIEN) 5 MG tablet Take 5 mg by mouth at bedtime as needed for sleep.     No current facility-administered medications on file prior to visit.    Allergies  Allergen  Reactions  . Meloxicam Other (See Comments)    BAD DREAMS, SAD THOUGHTS   Social History   Social History  . Marital status: Widowed    Spouse name: N/A  . Number of children: 0  . Years of education: N/A   Occupational History  .  [employer]   Social History Main Topics  . Smoking status: Former Smoker    Quit date: 07/24/2005  . Smokeless tobacco: Never Used  . Alcohol use Yes     Comment: rare  . Drug use: No  . Sexual activity: Not on file   Other Topics Concern  . Not on file   Social History Narrative  . No narrative on file     Review of Systems  All other systems reviewed and are negative.      Objective:   Physical Exam  Constitutional: She appears well-developed and well-nourished. No distress.  HENT:   Right Ear: External ear normal.  Left Ear: External ear normal.  Nose: Nose normal.  Mouth/Throat: Oropharynx is clear and moist. No oropharyngeal exudate.  Eyes: Conjunctivae are normal.  Neck: Neck supple.  Cardiovascular: Normal rate, regular rhythm and normal heart sounds.   Pulmonary/Chest: Effort normal. She has decreased breath sounds. She has wheezes.  Abdominal: Soft. Bowel sounds are normal.  Lymphadenopathy:    She has no cervical adenopathy.  Skin: She is not diaphoretic.  Vitals reviewed.         Assessment & Plan:  Pulmonary emphysema, unspecified emphysema type ([place])  Given the patient's shortness of breath with exertion, her daily requirement to use Combivent, her persistent sputum production, I will try to improve her maintenance medications to see if I can better control her breathing. Continue Symbicort and add Incruse 1 inhalation a day and recheck in 2 weeks. Samples were given. Discontinue Combivent and replace with albuterol 2 puff inh q 6 hrs prn.

## 2016-03-27 ENCOUNTER — Telehealth: Payer: Self-pay | Admitting: Family Medicine

## 2016-03-27 MED ORDER — ALBUTEROL SULFATE HFA 108 (90 BASE) MCG/ACT IN AERS: 2.0000 | INHALATION_SPRAY | Freq: Four times a day (QID) | RESPIRATORY_TRACT | 5 refills | Status: DC | PRN

## 2016-03-27 NOTE — Telephone Encounter (Signed)
Medication called/sent to requested pharmacy  

## 2016-03-27 NOTE — Telephone Encounter (Signed)
Pt wants a refill on pro air albuterol sent to [facility] pharmacy in [place].

## 2016-03-31 ENCOUNTER — Other Ambulatory Visit: Payer: Self-pay | Admitting: Family Medicine

## 2016-03-31 MED ORDER — LEVOTHYROXINE SODIUM 50 MCG PO TABS: ORAL_TABLET | ORAL | 3 refills | Status: DC

## 2016-03-31 MED ORDER — SIMVASTATIN 40 MG PO TABS: 40.0000 mg | ORAL_TABLET | Freq: Every day | ORAL | 3 refills | Status: DC

## 2016-04-05 ENCOUNTER — Other Ambulatory Visit: Payer: Self-pay | Admitting: Family Medicine

## 2016-04-25 ENCOUNTER — Emergency Department (HOSPITAL_COMMUNITY): Payer: Medicare HMO

## 2016-04-25 ENCOUNTER — Emergency Department (HOSPITAL_COMMUNITY)
Admission: EM | Admit: 2016-04-25 | Discharge: 2016-04-25 | Disposition: A | Discharge status: Home / Self Care | Payer: Medicare HMO | Attending: Emergency Medicine | Admitting: Emergency Medicine

## 2016-04-25 ENCOUNTER — Encounter (HOSPITAL_COMMUNITY): Payer: Self-pay | Admitting: Emergency Medicine

## 2016-04-25 DIAGNOSIS — Z87891 Personal history of nicotine dependence: Secondary | ICD-10-CM | POA: Diagnosis not present

## 2016-04-25 DIAGNOSIS — I471 Supraventricular tachycardia: Secondary | ICD-10-CM

## 2016-04-25 DIAGNOSIS — R002 Palpitations: Secondary | ICD-10-CM | POA: Diagnosis not present

## 2016-04-25 DIAGNOSIS — R0602 Shortness of breath: Secondary | ICD-10-CM | POA: Diagnosis present

## 2016-04-25 DIAGNOSIS — E039 Hypothyroidism, unspecified: Secondary | ICD-10-CM | POA: Insufficient documentation

## 2016-04-25 DIAGNOSIS — Z79899 Other long term (current) drug therapy: Secondary | ICD-10-CM | POA: Diagnosis not present

## 2016-04-25 DIAGNOSIS — J441 Chronic obstructive pulmonary disease with (acute) exacerbation: Secondary | ICD-10-CM

## 2016-04-25 DIAGNOSIS — Z7982 Long term (current) use of aspirin: Secondary | ICD-10-CM | POA: Diagnosis not present

## 2016-04-25 LAB — BASIC METABOLIC PANEL
ANION GAP: 8 (ref 5–15)
BUN: 10 mg/dL (ref 6–20)
CALCIUM: 9.6 mg/dL (ref 8.9–10.3)
CO2: 27 mmol/L (ref 22–32)
CREATININE: 0.91 mg/dL (ref 0.44–1.00)
Chloride: 105 mmol/L (ref 101–111)
GFR calc Af Amer: >60 mL/min (ref >60)
GLUCOSE: 113 mg/dL — AB (ref 65–99)
Potassium: 3.7 mmol/L (ref 3.5–5.1)
Sodium: 140 mmol/L (ref 135–145)

## 2016-04-25 LAB — CBC WITH DIFFERENTIAL/PLATELET
BASOS ABS: 0 10*3/uL (ref 0.0–0.1)
Basophils Relative: 0 %
EOS PCT: 4 %
Eosinophils Absolute: 0.5 10*3/uL (ref 0.0–0.7)
HCT: 46.1 % — ABNORMAL HIGH (ref 36.0–46.0)
Hemoglobin: 15.8 g/dL — ABNORMAL HIGH (ref 12.0–15.0)
LYMPHS PCT: 42 %
Lymphs Abs: 5.7 10*3/uL — ABNORMAL HIGH (ref 0.7–4.0)
MCH: 29.6 pg (ref 26.0–34.0)
MCHC: 34.3 g/dL (ref 30.0–36.0)
MCV: 86.3 fL (ref 78.0–100.0)
MONO ABS: 1.1 10*3/uL — AB (ref 0.1–1.0)
Monocytes Relative: 8 %
Neutro Abs: 6.3 10*3/uL (ref 1.7–7.7)
Neutrophils Relative %: 46 %
Platelets: 394 10*3/uL (ref 150–400)
RBC: 5.34 MIL/uL — ABNORMAL HIGH (ref 3.87–5.11)
RDW: 13.5 % (ref 11.5–15.5)
WBC: 13.6 10*3/uL — ABNORMAL HIGH (ref 4.0–10.5)

## 2016-04-25 MED ORDER — SODIUM CHLORIDE 0.9 % IV SOLN
INTRAVENOUS | Status: DC
  Administered 2016-04-25: 17:00:00 via INTRAVENOUS

## 2016-04-25 MED ORDER — ADENOSINE 6 MG/2ML IV SOLN
INTRAVENOUS | Status: AC
  Administered 2016-04-25: 6 mg
  Filled 2016-04-25: qty 6

## 2016-04-25 MED ORDER — LORAZEPAM 2 MG/ML IJ SOLN
1.0000 mg | Freq: Once | INTRAMUSCULAR | Status: AC
  Administered 2016-04-25: 1 mg via INTRAVENOUS
  Filled 2016-04-25: qty 1

## 2016-04-25 MED ORDER — PREDNISONE 10 MG PO TABS: 40.0000 mg | ORAL_TABLET | Freq: Every day | ORAL | 0 refills | Status: DC

## 2016-04-25 MED ORDER — ADENOSINE 6 MG/2ML IV SOLN
12.0000 mg | Freq: Once | INTRAVENOUS | Status: AC
  Administered 2016-04-25: 12 mg via INTRAVENOUS

## 2016-04-25 MED ORDER — SODIUM CHLORIDE 0.9 % IV BOLUS (SEPSIS)
250.0000 mL | Freq: Once | INTRAVENOUS | Status: AC
  Administered 2016-04-25: 250 mL via INTRAVENOUS

## 2016-04-25 MED ORDER — PREDNISONE 50 MG PO TABS
60.0000 mg | ORAL_TABLET | Freq: Once | ORAL | Status: AC
  Administered 2016-04-25: 60 mg via ORAL
  Filled 2016-04-25: qty 1

## 2016-04-25 NOTE — ED Notes (Signed)
Dr. Rogene Houston at bedside.  Crash cart at bedside with pads attached to pt.

## 2016-04-25 NOTE — ED Notes (Signed)
Pt successfully converted to nsr heart rate 105, will continue to monitor

## 2016-04-25 NOTE — ED Triage Notes (Signed)
Pt reports picking up her grandson and became suddenly short of breath and feeling like her heart was racing.  Pt states this has never happened before.

## 2016-04-25 NOTE — ED Notes (Signed)
ED Provider at bedside. 

## 2016-04-25 NOTE — ED Provider Notes (Addendum)
[facility] DEPT Provider Note   CSN: [id number] Arrival date & time: 04/25/16  1652     History   Chief Complaint Chief Complaint  Patient presents with  . Shortness of Breath  . Tachycardia    HPI Mary Blevins is a 71 y.o. female.  Patient presents with rapid heart rate that started shortly prior to arrival. Patient's been struggling with some wheezing has a history of COPD for the past 3 days. Using her albuterol. And suddenly her heart rate got very fast. Not associated with chest pain. Still does feel short of breath. Has not had a history of this in the past. Does not feel like she's been a pass out.      Past Medical History:  Diagnosis Date  . Arthritis   . COPD (chronic obstructive pulmonary disease) ([place])   . Elevated lipids   . Generalized headaches   . History of pneumonia   . Hyperlipidemia   . Hypothyroidism   . MVP (mitral valve prolapse)    palpitations  . Osteopenia   . Seasonal allergies   . Sinusitis   . Tubular adenoma of colon     Patient Active Problem List   Diagnosis Date Noted  . Benign neoplasm of ascending colon   . Benign neoplasm of transverse colon   . Benign neoplasm of sigmoid colon   . Diverticulosis of sigmoid colon   . Grade I internal hemorrhoids   . PSVT (paroxysmal supraventricular tachycardia) ([place]) 02/17/2015  . Back pain 07/22/2012  . Acute pain of left hip 07/22/2012  . Stricture and stenosis of esophagus 09/18/2011  . Hx of colonic polyps 09/18/2011  . COPD (chronic obstructive pulmonary disease) ([place]) 07/14/2010  . Generalized headaches 07/14/2010  . Osteopenia 07/14/2010  . Hypothyroid 07/14/2010  . Elevated lipids 07/14/2010    Past Surgical History:  Procedure Laterality Date  . COLONOSCOPY WITH PROPOFOL N/A 01/06/2016   Procedure: COLONOSCOPY WITH PROPOFOL;  Surgeon: Doran Stabler, MD;  Location: [facility];  Service: Gastroenterology;  Laterality: N/A;  . DILATION AND CURETTAGE OF UTERUS     . THYROIDECTOMY    . TONSILLECTOMY      OB History    No data available       Home Medications    Prior to Admission medications   Medication Sig Start Date End Date Taking? Authorizing Provider  albuterol (PROVENTIL HFA;VENTOLIN HFA) 108 (90 Base) MCG/ACT inhaler Inhale 2 puffs into the lungs every 6 (six) hours as needed for wheezing or shortness of breath. 03/27/16   [name], MD  aspirin 81 MG tablet Take 81 mg by mouth daily.      Historical Provider, MD  benzonatate (TESSALON) 200 MG capsule Take 1 capsule (200 mg total) by mouth every 8 (eight) hours as needed for cough. 02/01/16   [name], MD  budesonide-formoterol (SYMBICORT) 160-4.5 MCG/ACT inhaler INHALE 2 PUFFS INTO THE LUNGS 2 (TWO) TIMES DAILY. 11/25/15   [name], MD  cetirizine (ZYRTEC) 10 MG tablet Take 1 tablet (10 mg total) by mouth daily. 08/05/15   [name], PA-C  cycloSPORINE (RESTASIS) 0.05 % ophthalmic emulsion Place 1 drop into both eyes 2 (two) times daily.  04/09/13   Historical Provider, MD  fluticasone (FLONASE) 50 MCG/ACT nasal spray USE 2 SPRAYS EACH SIDE ONCE A DAY 12/14/14   [name], MD  levalbuterol Penne Lash) 1.25 MG/3ML nebulizer solution Take 1.25 mg by nebulization every 8 (eight) hours as needed  for wheezing. 08/27/12   [name], MD  levothyroxine (SYNTHROID, LEVOTHROID) 50 MCG tablet TAKE 1 TABLET (50 MCG TOTAL) BY MOUTH DAILY BEFORE BREAKFAST. 03/31/16   [name], MD  levothyroxine (SYNTHROID, LEVOTHROID) 50 MCG tablet TAKE 1 TABLET (50 MCG TOTAL) BY MOUTH DAILY BEFORE BREAKFAST. 04/06/16   [name], MD  naproxen (NAPROSYN) 250 MG tablet Take 250 mg by mouth 2 (two) times daily as needed for mild pain.     Historical Provider, MD  omeprazole (PRILOSEC) 20 MG capsule Take 20 mg by mouth daily as needed.     Historical Provider, MD  OXYGEN Inhale 2 L into the lungs as needed.    Historical Provider, MD  Polyethyl Glycol-Propyl Glycol (SYSTANE)  0.4-0.3 % SOLN Place 1 drop into both eyes daily.     Historical Provider, MD  predniSONE (DELTASONE) 10 MG tablet Take 4 tablets (40 mg total) by mouth daily. 04/25/16   [name], MD  simvastatin (ZOCOR) 40 MG tablet Take 1 tablet (40 mg total) by mouth at bedtime. 03/31/16   [name], MD  tiZANidine (ZANAFLEX) 4 MG tablet TAKE 1 TABLET (4 MG TOTAL) BY MOUTH EVERY 6 (SIX) HOURS AS NEEDED FOR MUSCLE SPASMS. 10/29/15   [name], MD  zolpidem (AMBIEN) 5 MG tablet Take 5 mg by mouth at bedtime as needed for sleep.    Historical Provider, MD    Family History Family History  Problem Relation Age of Onset  . Breast cancer Maternal Aunt   . Heart attack Maternal Grandmother   . Heart attack Mother   . Heart disease Mother   . Dementia Mother   . Diabetes Brother   . Prostate cancer Maternal Grandfather   . Healthy Father   . Healthy Sister   . Heart attack Other     NIECE  . Colon cancer Neg Hx   . Esophageal cancer Neg Hx   . Rectal cancer Neg Hx   . Stomach cancer Neg Hx     Social History Social History  Substance Use Topics  . Smoking status: Former Smoker    Quit date: 07/24/2005  . Smokeless tobacco: Never Used  . Alcohol use Yes     Comment: rare     Allergies   Meloxicam   Review of Systems Review of Systems  Constitutional: Negative for fever.  HENT: Negative for congestion.   Eyes: Negative for visual disturbance.  Respiratory: Positive for shortness of breath and wheezing.   Cardiovascular: Positive for palpitations. Negative for chest pain.  Gastrointestinal: Negative for abdominal pain, nausea and vomiting.  Genitourinary: Negative for dysuria.  Musculoskeletal: Negative for back pain.  Skin: Negative for rash.  Neurological: Negative for headaches.  Hematological: Does not bruise/bleed easily.  Psychiatric/Behavioral: Negative for confusion.     Physical Exam Updated Vital Signs BP (!) 113/101   Pulse 86   Temp 97.9 F (36.6 C)  (Oral)   Resp 17   Ht 5\' 6"  (1.676 m)   Wt 82.6 kg   SpO2 96%   BMI 29.38 kg/m   Physical Exam  Constitutional: She is oriented to person, place, and time. She appears well-developed and well-nourished. She appears distressed.  HENT:  Head: Normocephalic and atraumatic.  Mouth/Throat: Oropharynx is clear and moist.  Eyes: Conjunctivae and EOM are normal. Pupils are equal, round, and reactive to light.  Neck: Normal range of motion. Neck supple.  Cardiovascular: Regular rhythm.   Very tachycardic.  Pulmonary/Chest: She  is in respiratory distress. She has wheezes.  Abdominal: Soft. Bowel sounds are normal. There is no tenderness.  Musculoskeletal: Normal range of motion. She exhibits no edema.  Neurological: She is alert and oriented to person, place, and time. No cranial nerve deficit or sensory deficit. She exhibits normal muscle tone. Coordination normal.  Skin: Skin is warm.  Nursing note and vitals reviewed.    ED Treatments / Results  Labs (all labs ordered are listed, but only abnormal results are displayed) Labs Reviewed  CBC WITH DIFFERENTIAL/PLATELET - Abnormal; Notable for the following:       Result Value   WBC 13.6 (*)    RBC 5.34 (*)    Hemoglobin 15.8 (*)    HCT 46.1 (*)    Lymphs Abs 5.7 (*)    Monocytes Absolute 1.1 (*)    All other components within normal limits  BASIC METABOLIC PANEL - Abnormal; Notable for the following:    Glucose, Bld 113 (*)    All other components within normal limits    EKG  EKG Interpretation  Date/Time:  Tuesday April 25 2016 16:58:26 EDT Ventricular Rate:  168 PR Interval:    QRS Duration: 84 QT Interval:  292 QTC Calculation: 489 R Axis:   91 Text Interpretation:  Supraventricular tachycardia Right axis deviation Probable anteroseptal infarct, old ST depression, probably rate related Baseline wander in lead(s) II aVR V2 Confirmed by Rogene Houston  MD, Elane Peabody [phone]) on 04/25/2016 5:52:56 PM       Radiology Dg Chest Port  1 View  Result Date: 04/25/2016 CLINICAL DATA:  Palpitations.  Dyspnea . EXAM: PORTABLE CHEST 1 VIEW COMPARISON:  05/28/2015 chest radiograph. FINDINGS: Pacer pad obscures the left lower chest. Stable cardiomediastinal silhouette with mild cardiomegaly and aortic atherosclerosis. No pneumothorax. No right pleural effusion. Possible small left pleural effusion. No overt pulmonary edema. No acute consolidative airspace disease. IMPRESSION: Mild cardiomegaly without overt pulmonary edema. No acute pulmonary disease. Possible small left pleural effusion. Aortic atherosclerosis. Electronically Signed   By: Ilona Sorrel M.D.   On: 04/25/2016 17:46    Procedures Procedures (including critical care time)   CRITICAL CARE Performed by: [name] Total critical care time: 30 minutes Critical care time was exclusive of separately billable procedures and treating other patients. Critical care was necessary to treat or prevent imminent or life-threatening deterioration. Critical care was time spent personally by me on the following activities: development of treatment plan with patient and/or surrogate as well as nursing, discussions with consultants, evaluation of patient's response to treatment, examination of patient, obtaining history from patient or surrogate, ordering and performing treatments and interventions, ordering and review of laboratory studies, ordering and review of radiographic studies, pulse oximetry and re-evaluation of patient's condition.   Medications Ordered in ED Medications  0.9 %  sodium chloride infusion ( Intravenous New Bag/Given 04/25/16 1721)  predniSONE (DELTASONE) tablet 60 mg (not administered)  adenosine (ADENOCARD) 6 MG/2ML injection (6 mg  Given 04/25/16 1708)  adenosine (ADENOCARD) 6 MG/2ML injection 12 mg (12 mg Intravenous Given 04/25/16 1710)  sodium chloride 0.9 % bolus 250 mL (0 mLs Intravenous Stopped 04/25/16 1753)  LORazepam (ATIVAN) injection 1 mg (1 mg  Intravenous Given 04/25/16 1720)     Initial Impression / Assessment and Plan / ED Course  I have reviewed the triage vital signs and the nursing notes.  Pertinent labs & imaging results that were available during my care of the patient were reviewed by me and considered in my medical decision  making (see chart for details).      Patient presented in rapid supraventricular tachycardia heart rate around 160. Patient never had a history of that before. Patient felt short of breath. Ask patient still short of breath for the past 3 days felt as if she was wheezing. Has been using her albuterol inhaler. States she has not been using it more than usual.   Patient was given adenosine 6 mg IV push and a proximal IV in her right arm. No response. Gave 12 mg IV rapid push and patient converted to normal sinus rhythm.  Patient's labs without any acute findings. Lungs are now clear no wheezing. But based on her history of the wheezing for the past few days will start on prednisone. Have her follow-up with her record Dr.   Patient rapid heart rate occurred shortly prior to arrival. The had not been going on for days.   Chest x-ray without acute findings other than a question small pleural effusion.  Patient following the conversion was given some Ativan IV due to her being very anxious. This settled her down significantly.   Final Clinical Impressions(s) / ED Diagnoses   Final diagnoses:  SVT (supraventricular tachycardia) (HCC)  COPD exacerbation (HCC)          New Prescriptions New Prescriptions   PREDNISONE (DELTASONE) 10 MG TABLET    Take 4 tablets (40 mg total) by mouth daily.     [name], MD 04/25/16 1918    [name], MD 04/25/16 1919

## 2016-04-25 NOTE — Discharge Instructions (Signed)
Make an appointment to follow-up with your regular doctor for the rapid heart rate. Return for rapid heartbeat reoccurring in lasting for 40 minutes or longer. Start prednisone for the wheezing is had for the last few days. Return for any new or worse symptoms. Can resume taking your albuterol.

## 2016-05-01 ENCOUNTER — Encounter: Payer: Self-pay | Admitting: Family Medicine

## 2016-05-01 ENCOUNTER — Ambulatory Visit (INDEPENDENT_AMBULATORY_CARE_PROVIDER_SITE_OTHER): Payer: Medicare HMO | Admitting: Family Medicine

## 2016-05-01 ENCOUNTER — Ambulatory Visit: Payer: Medicare HMO | Admitting: Family Medicine

## 2016-05-01 VITALS — BP 134/90 | HR 72 | Temp 98.4°F | Resp 18 | Wt 180.0 lb

## 2016-05-01 DIAGNOSIS — I471 Supraventricular tachycardia: Secondary | ICD-10-CM | POA: Diagnosis not present

## 2016-05-01 NOTE — Progress Notes (Signed)
Subjective:    Patient ID: Mary Blevins, female    DOB: Jul 10, 1945, 72 y.o.   MRN: [id number]  HPI After last visit, the patient was supposed to be taking Symbicort and incruse on a daily basis to help manage her COPD. She was supposed to only be using albuterol as a rescue medication every 6 hours as needed. On April 3, the patient reported a subjective feeling of shortness of breath. She used her albuterol metered-dose inhaler. She also used her mother's Combivent. She also used her mother's Xopenex nebulizer treatment. She felt like the medication would help her breathe better. However after taking all these medications in short order, the patient developed tachycardia and had go the emergency room where she was found to be in SVT. Patient ultimately converted into sinus rhythm after 12 mg of adenosine. She is here today for follow-up. She's completed a 40 mg a day of prednisone prescribed by the emergency room physician. Her wheezing has completely improved. Her shortness of breath is better Past Medical History:  Diagnosis Date  . Arthritis   . COPD (chronic obstructive pulmonary disease) ([place])   . Elevated lipids   . Generalized headaches   . History of pneumonia   . Hyperlipidemia   . Hypothyroidism   . MVP (mitral valve prolapse)    palpitations  . Osteopenia   . Seasonal allergies   . Sinusitis   . Tubular adenoma of colon    Past Surgical History:  Procedure Laterality Date  . COLONOSCOPY WITH PROPOFOL N/A 01/06/2016   Procedure: COLONOSCOPY WITH PROPOFOL;  Surgeon: Doran Stabler, MD;  Location: [facility];  Service: Gastroenterology;  Laterality: N/A;  . DILATION AND CURETTAGE OF UTERUS    . THYROIDECTOMY    . TONSILLECTOMY     Current Outpatient Prescriptions on File Prior to Visit  Medication Sig Dispense Refill  . albuterol (PROVENTIL HFA;VENTOLIN HFA) 108 (90 Base) MCG/ACT inhaler Inhale 2 puffs into the lungs every 6 (six) hours as needed for wheezing or  shortness of breath. 1 Inhaler 5  . aspirin 81 MG tablet Take 81 mg by mouth daily.      . benzonatate (TESSALON) 200 MG capsule Take 1 capsule (200 mg total) by mouth every 8 (eight) hours as needed for cough. 30 capsule 0  . budesonide-formoterol (SYMBICORT) 160-4.5 MCG/ACT inhaler INHALE 2 PUFFS INTO THE LUNGS 2 (TWO) TIMES DAILY. 10.2 Inhaler 2  . cetirizine (ZYRTEC) 10 MG tablet Take 1 tablet (10 mg total) by mouth daily. 30 tablet 11  . levalbuterol (XOPENEX) 1.25 MG/3ML nebulizer solution Take 1.25 mg by nebulization every 8 (eight) hours as needed for wheezing. 72 mL 12  . levothyroxine (SYNTHROID, LEVOTHROID) 50 MCG tablet TAKE 1 TABLET (50 MCG TOTAL) BY MOUTH DAILY BEFORE BREAKFAST. 90 tablet 3  . naproxen (NAPROSYN) 250 MG tablet Take 250 mg by mouth 2 (two) times daily as needed for mild pain.     . OXYGEN Inhale 2 L into the lungs as needed.    Vladimir Faster Glycol-Propyl Glycol (SYSTANE) 0.4-0.3 % SOLN Place 1 drop into both eyes daily.     . simvastatin (ZOCOR) 40 MG tablet Take 1 tablet (40 mg total) by mouth at bedtime. 90 tablet 3  . tiZANidine (ZANAFLEX) 4 MG tablet TAKE 1 TABLET (4 MG TOTAL) BY MOUTH EVERY 6 (SIX) HOURS AS NEEDED FOR MUSCLE SPASMS. 30 tablet 0  . zolpidem (AMBIEN) 5 MG tablet Take 5 mg by mouth at bedtime as  needed for sleep.    . cycloSPORINE (RESTASIS) 0.05 % ophthalmic emulsion Place 1 drop into both eyes 2 (two) times daily.     . fluticasone (FLONASE) 50 MCG/ACT nasal spray USE 2 SPRAYS EACH SIDE ONCE A DAY (Patient not taking: Reported on 05/01/2016) 48 g 11  . omeprazole (PRILOSEC) 20 MG capsule Take 20 mg by mouth daily as needed.     . predniSONE (DELTASONE) 10 MG tablet Take 4 tablets (40 mg total) by mouth daily. (Patient not taking: Reported on 05/01/2016) 20 tablet 0   No current facility-administered medications on file prior to visit.    Allergies  Allergen Reactions  . Meloxicam Other (See Comments)    BAD DREAMS, SAD THOUGHTS   Social History     Social History  . Marital status: Widowed    Spouse name: N/A  . Number of children: 0  . Years of education: N/A   Occupational History  .  [employer]   Social History Main Topics  . Smoking status: Former Smoker    Quit date: 07/24/2005  . Smokeless tobacco: Never Used  . Alcohol use Yes     Comment: rare  . Drug use: No  . Sexual activity: Not on file   Other Topics Concern  . Not on file   Social History Narrative  . No narrative on file     Review of Systems  All other systems reviewed and are negative.      Objective:   Physical Exam  Constitutional: She appears well-developed and well-nourished. No distress.  HENT:  Right Ear: External ear normal.  Left Ear: External ear normal.  Nose: Nose normal.  Mouth/Throat: Oropharynx is clear and moist. No oropharyngeal exudate.  Eyes: Conjunctivae are normal.  Neck: Neck supple.  Cardiovascular: Normal rate, regular rhythm and normal heart sounds.   Pulmonary/Chest: Effort normal. She has no decreased breath sounds. She has no wheezes. She has no rhonchi. She has no rales.  Abdominal: Soft. Bowel sounds are normal.  Lymphadenopathy:    She has no cervical adenopathy.  Skin: She is not diaphoretic.  Vitals reviewed.         Assessment & Plan:  SVT (supraventricular tachycardia) (HCC)   The patient has improved.  From a respiratory standpoint her bronchospasm has completely resolved. I spent more than 30 minutes with the patient today explaining the roles of medication. Moving forward, she should only be on Symbicort 160/4.5, 2 puffs inhaled twice a day as a maintenance medication. I explained that rescue medication should not be combined. She should only use albuterol 2 puffs inhaled every 6 hours as needed for shortness of breath. I do not want her mixing that with her mother's medications including Combivent and Xopenex. I explained to the patient that she is overdosing unintentionally and  this likely caused her SVT. She is going to schedule follow-up with her cardiologist, Dr. Tamala Julian to discuss further. I believe this was an isolated episode secondary to medication misuse

## 2016-05-02 ENCOUNTER — Ambulatory Visit: Payer: Medicare HMO | Admitting: Family Medicine

## 2016-05-05 ENCOUNTER — Telehealth: Payer: Self-pay | Admitting: Family Medicine

## 2016-05-05 NOTE — Telephone Encounter (Signed)
Pt called and LMOVM stating that she was thankful to God for you and your help and really appreciated what you did for her. She wants to know if it is ok to continue to use O2 . She only uses it prn but wanted to make sure it was ok for her to continue use?? She also states that she has not had to use her rescue inhaler since going to hospital.   Needs samples of Symbicort.  CB# [phone]

## 2016-05-05 NOTE — Telephone Encounter (Signed)
Pt aware.

## 2016-05-05 NOTE — Telephone Encounter (Signed)
Ok to use prn.

## 2016-06-08 NOTE — Progress Notes (Signed)
Cardiology Office Note    Date:  06/09/2016   ID:  Mary Blevins, DOB January 25, 1945, MRN [id number]  PCP:  [name], MD  Cardiologist: Sinclair Grooms, MD   Chief Complaint  Patient presents with  . Palpitations    History of Present Illness:  Mary Blevins is a 71 y.o. female who presents for PSVT and COPD.  The patient has been followed intermittently by me for over the past 15 years. 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 He was maintained for quite some time on calcium channel blocker therapy. Some while back she discontinued the medication and when I saw her for return visit she had no complaints of recurrent SVT. We decided to leave the medication off.  04/25/2016 she developed sudden tachycardia, had palpitations, and went to [facility] emergency room where EKG documented narrow complex tachycardia at 168 bpm. She eventually broke with 12 mg of IV adenosine. She has had no recurrence since that time. She is here today for clinical follow-up. She denies chest pain.  She has had increasing difficulty with dyspnea on exertion and worsening pulmonary disease. Her primary physician has needed to increase bronchodilator therapy.  Past Medical History:  Diagnosis Date  . Arthritis   . COPD (chronic obstructive pulmonary disease) ([place])   . Elevated lipids   . Generalized headaches   . History of pneumonia   . Hyperlipidemia   . Hypothyroidism   . MVP (mitral valve prolapse)    palpitations  . Osteopenia   . Seasonal allergies   . Sinusitis   . Tubular adenoma of colon     Past Surgical History:  Procedure Laterality Date  . COLONOSCOPY WITH PROPOFOL N/A 01/06/2016   Procedure: COLONOSCOPY WITH PROPOFOL;  Surgeon: Doran Stabler, MD;  Location: [facility];  Service: Gastroenterology;  Laterality: N/A;  . DILATION AND CURETTAGE OF UTERUS    . THYROIDECTOMY    . TONSILLECTOMY      Current Medications: Outpatient  Medications Prior to Visit  Medication Sig Dispense Refill  . albuterol (PROVENTIL HFA;VENTOLIN HFA) 108 (90 Base) MCG/ACT inhaler Inhale 2 puffs into the lungs every 6 (six) hours as needed for wheezing or shortness of breath. 1 Inhaler 5  . aspirin 81 MG tablet Take 81 mg by mouth daily.      . benzonatate (TESSALON) 200 MG capsule Take 1 capsule (200 mg total) by mouth every 8 (eight) hours as needed for cough. 30 capsule 0  . budesonide-formoterol (SYMBICORT) 160-4.5 MCG/ACT inhaler INHALE 2 PUFFS INTO THE LUNGS 2 (TWO) TIMES DAILY. 10.2 Inhaler 2  . cetirizine (ZYRTEC) 10 MG tablet Take 1 tablet (10 mg total) by mouth daily. 30 tablet 11  . cycloSPORINE (RESTASIS) 0.05 % ophthalmic emulsion Place 1 drop into both eyes 2 (two) times daily.     . fluticasone (FLONASE) 50 MCG/ACT nasal spray USE 2 SPRAYS EACH SIDE ONCE A DAY 48 g 11  . levalbuterol (XOPENEX) 1.25 MG/3ML nebulizer solution Take 1.25 mg by nebulization every 8 (eight) hours as needed for wheezing. 72 mL 12  . levothyroxine (SYNTHROID, LEVOTHROID) 50 MCG tablet TAKE 1 TABLET (50 MCG TOTAL) BY MOUTH DAILY BEFORE BREAKFAST. 90 tablet 3  . naproxen (NAPROSYN) 250 MG tablet Take 250 mg by mouth 2 (two) times daily as needed for mild pain.     Marland Kitchen omeprazole (PRILOSEC) 20 MG capsule Take 20 mg by mouth daily as needed.     Marland Kitchen  OXYGEN Inhale 2 L into the lungs as needed (shortness of breath).     Vladimir Faster Glycol-Propyl Glycol (SYSTANE) 0.4-0.3 % SOLN Place 1 drop into both eyes daily.     . predniSONE (DELTASONE) 10 MG tablet Take 4 tablets (40 mg total) by mouth daily. 20 tablet 0  . simvastatin (ZOCOR) 40 MG tablet Take 1 tablet (40 mg total) by mouth at bedtime. 90 tablet 3  . tiZANidine (ZANAFLEX) 4 MG tablet TAKE 1 TABLET (4 MG TOTAL) BY MOUTH EVERY 6 (SIX) HOURS AS NEEDED FOR MUSCLE SPASMS. 30 tablet 0  . zolpidem (AMBIEN) 5 MG tablet Take 5 mg by mouth at bedtime as needed for sleep.     No facility-administered medications prior  to visit.      Allergies:   Meloxicam   Social History   Social History  . Marital status: Widowed    Spouse name: N/A  . Number of children: 0  . Years of education: N/A   Occupational History  .  [employer]   Social History Main Topics  . Smoking status: Former Smoker    Quit date: 07/24/2005  . Smokeless tobacco: Never Used  . Alcohol use Yes     Comment: rare  . Drug use: No  . Sexual activity: Not Asked   Other Topics Concern  . None   Social History Narrative  . None     Family History:  The patient's family history includes Breast cancer in her maternal aunt; Dementia in her mother; Diabetes in her brother; Healthy in her father and sister; Heart attack in her maternal grandmother, mother, and other; Heart disease in her mother; Prostate cancer in her maternal grandfather.   ROS:   Please see the history of present illness.    Other problems include chronic back pain, muscle pain, shortness of breath, wheezing, and headache.  All other systems reviewed and are negative.   PHYSICAL EXAM:   VS:  BP 118/80 (BP Location: Left Arm)   Pulse 74   Ht 5' 5.5" (1.664 m)   Wt 176 lb 1.9 oz (79.9 kg)   BMI 28.86 kg/m    GEN: Well nourished, well developed, in no acute distress  HEENT: normal  Neck: no JVD, carotid bruits, or masses Cardiac: RRR; no murmurs, rubs, or gallops,no edema  Respiratory:  clear to auscultation bilaterally, normal work of breathing GI: soft, nontender, nondistended, + BS MS: no deformity or atrophy  Skin: warm and dry, no rash Neuro:  Alert and Oriented x 3, Strength and sensation are intact Psych: euthymic mood, full affect  Wt Readings from Last 3 Encounters:  06/09/16 176 lb 1.9 oz (79.9 kg)  05/01/16 180 lb (81.6 kg)  04/25/16 182 lb (82.6 kg)      Studies/Labs Reviewed:   EKG:  EKG  Not done.  Reviewed the tachycardia EKG from 04/25/2016 and it is consistent with AV node reentrant tachycardia. No postconversion  EKG was captured.  Recent Labs: 11/04/2015: ALT 14; TSH 2.04 04/25/2016: BUN 10; Creatinine, Ser 0.91; Hemoglobin 15.8; Platelets 394; Potassium 3.7; Sodium 140   Lipid Panel    Component Value Date/Time   CHOL 178 12/14/2014 1046   TRIG 218 (H) 12/14/2014 1046   HDL 44 (L) 12/14/2014 1046   CHOLHDL 4.0 12/14/2014 1046   VLDL 44 (H) 12/14/2014 1046   LDLCALC 90 12/14/2014 1046    Additional studies/ records that were reviewed today include:  Chest x-ray 04/25/16:  IMPRESSION: Mild cardiomegaly  without overt pulmonary edema. No acute pulmonary disease.  Possible small left pleural effusion.  Aortic atherosclerosis.   ASSESSMENT:    1. PSVT (paroxysmal supraventricular tachycardia) (HCC)   2. Pulmonary emphysema, unspecified emphysema type ([place])   3. Aortic atherosclerosis (HCC)      PLAN:  In order of problems listed above:  1. Will add Calan SR 120 mg daily. Clinical follow-up in 4-6 weeks to insure adjustment to the medication and determine if any recurrent SVT. 2. Significant. No longer smokes. Bronchodilator therapy may predispose to recurrent episodes of PSVT hence Calan SR is added to provide some protection against recurrence. 3. LDL cholesterol needs to be managed to 70 or less.  Clinical follow-up in 6 weeks or so to ensure she is tolerating the added therapy. She should call if recurrent PSVT.  Medication Adjustments/Labs and Tests Ordered: Current medicines are reviewed at length with the patient today.  Concerns regarding medicines are outlined above.  Medication changes, Labs and Tests ordered today are listed in the Patient Instructions below. Patient Instructions  Medication Instructions:  1) START Verapamil 120mg  once daily  Labwork: None  Testing/Procedures: None  Follow-Up: Your physician recommends that you schedule a follow-up appointment in: 1 month with a PA or NP.  Your physician wants you to follow-up in: 1 year with Dr. Tamala Julian.  You  will receive a reminder letter in the mail two months in advance. If you Blevins't receive a letter, please call our office to schedule the follow-up appointment.    Any Other Special Instructions Will Be Listed Below (If Applicable).     If you need a refill on your cardiac medications before your next appointment, please call your pharmacy.      Signed, Sinclair Grooms, MD  06/09/2016 4:54 PM    [facility] [address], [place], [place]  [address] Phone: [phone]; Fax: [phone]

## 2016-06-09 ENCOUNTER — Ambulatory Visit (INDEPENDENT_AMBULATORY_CARE_PROVIDER_SITE_OTHER): Payer: Medicare HMO | Admitting: Interventional Cardiology

## 2016-06-09 ENCOUNTER — Encounter: Payer: Self-pay | Admitting: Interventional Cardiology

## 2016-06-09 VITALS — BP 118/80 | HR 74 | Ht 65.5 in | Wt 176.1 lb

## 2016-06-09 DIAGNOSIS — I471 Supraventricular tachycardia: Secondary | ICD-10-CM | POA: Diagnosis not present

## 2016-06-09 DIAGNOSIS — I700 Atherosclerosis of aorta: Secondary | ICD-10-CM | POA: Diagnosis not present

## 2016-06-09 DIAGNOSIS — J439 Emphysema, unspecified: Secondary | ICD-10-CM | POA: Diagnosis not present

## 2016-06-09 MED ORDER — VERAPAMIL HCL ER 120 MG PO TBCR: 120.0000 mg | EXTENDED_RELEASE_TABLET | Freq: Every day | ORAL | 3 refills | Status: DC

## 2016-06-09 MED ORDER — VERAPAMIL HCL ER 120 MG PO TBCR
120.0000 mg | EXTENDED_RELEASE_TABLET | Freq: Every day | ORAL | 3 refills | Status: DC
Start: 2016-06-09 — End: 2016-06-09

## 2016-06-09 NOTE — Patient Instructions (Signed)
Medication Instructions:  1) START Verapamil 120mg  once daily  Labwork: None  Testing/Procedures: None  Follow-Up: Your physician recommends that you schedule a follow-up appointment in: 1 month with a PA or NP.  Your physician wants you to follow-up in: 1 year with Dr. Tamala Julian.  You will receive a reminder letter in the mail two months in advance. If you don't receive a letter, please call our office to schedule the follow-up appointment.    Any Other Special Instructions Will Be Listed Below (If Applicable).     If you need a refill on your cardiac medications before your next appointment, please call your pharmacy.

## 2016-06-27 DIAGNOSIS — J329 Chronic sinusitis, unspecified: Secondary | ICD-10-CM | POA: Diagnosis not present

## 2016-06-27 DIAGNOSIS — J441 Chronic obstructive pulmonary disease with (acute) exacerbation: Secondary | ICD-10-CM | POA: Diagnosis not present

## 2016-06-29 ENCOUNTER — Telehealth: Payer: Self-pay | Admitting: Family Medicine

## 2016-06-29 DIAGNOSIS — J069 Acute upper respiratory infection, unspecified: Secondary | ICD-10-CM

## 2016-06-29 NOTE — Telephone Encounter (Signed)
Pt started with a cough ths weekend and since you were not here she went to UC and placed on Cefdinir, Pred and Robitussin AC. The robitussin is not helping her with the cough and would like to know if you would refill her Hycodan?  CB# [phone]

## 2016-06-30 MED ORDER — HYDROCODONE-HOMATROPINE 5-1.5 MG/5ML PO SYRP: 5.0000 mL | ORAL_SOLUTION | Freq: Three times a day (TID) | ORAL | 0 refills | Status: DC | PRN

## 2016-06-30 NOTE — Telephone Encounter (Signed)
Ok with hycodan 5 ml poq6 hrs prn cough 4 oz

## 2016-06-30 NOTE — Telephone Encounter (Signed)
RX printed, left up front and patient aware to pick up  

## 2016-07-12 NOTE — Progress Notes (Signed)
Cardiology Office Note    Date:  07/13/2016   ID:  Mary Blevins, DOB May 02, 1945, MRN [id number]  PCP:  [name], MD  Cardiologist:  Dr. Tamala Julian   CC: follow up  History of Present Illness:  Mary Blevins is a 71 y.o. female with a history of PSVT, COPD, HLD and hypothyroidism who presents to clinic for follow up.   She has been followed by Dr. Tamala Julian over the years for PSVT. She was maintained on a CCB for quite some time but then self discontinued the medication. She did well for a while off the medication but then was seen in the ER on 04/25/16 for tachy palpitations. ECG there showed a narrow complex tachy with HR 168. She eventually broke after 12mg  of adenosine. She was seen in follow up by Dr. Tamala Julian on 06/09/16 and doing well. She complained of worsening DOE and COPD needing escalation of bronchodilator therapy. Calan SR 120mg  daily was added to her medical regimen.  Today she presents to clinic for follow up. She is feeling. No chest pain. She had DOE that is chronic from her COPD. The heat is really getting to her. No LE edema, orthopnea or PND. No dizziness or syncope. No blood in stool or urine. No palpitations. Has some numbness in her toes.   Past Medical History:  Diagnosis Date  . Arthritis   . COPD (chronic obstructive pulmonary disease) ([place])   . Elevated lipids   . Generalized headaches   . History of pneumonia   . Hyperlipidemia   . Hypothyroidism   . MVP (mitral valve prolapse)    palpitations  . Osteopenia   . Seasonal allergies   . Sinusitis   . Tubular adenoma of colon     Past Surgical History:  Procedure Laterality Date  . COLONOSCOPY WITH PROPOFOL N/A 01/06/2016   Procedure: COLONOSCOPY WITH PROPOFOL;  Surgeon: Doran Stabler, MD;  Location: [facility];  Service: Gastroenterology;  Laterality: N/A;  . DILATION AND CURETTAGE OF UTERUS    . THYROIDECTOMY    . TONSILLECTOMY      Current Medications: Outpatient Medications  Prior to Visit  Medication Sig Dispense Refill  . albuterol (PROVENTIL HFA;VENTOLIN HFA) 108 (90 Base) MCG/ACT inhaler Inhale 2 puffs into the lungs every 6 (six) hours as needed for wheezing or shortness of breath. 1 Inhaler 5  . aspirin 81 MG tablet Take 81 mg by mouth daily.      . benzonatate (TESSALON) 200 MG capsule Take 1 capsule (200 mg total) by mouth every 8 (eight) hours as needed for cough. 30 capsule 0  . budesonide-formoterol (SYMBICORT) 160-4.5 MCG/ACT inhaler INHALE 2 PUFFS INTO THE LUNGS 2 (TWO) TIMES DAILY. 10.2 Inhaler 2  . cetirizine (ZYRTEC) 10 MG tablet Take 1 tablet (10 mg total) by mouth daily. 30 tablet 11  . HYDROcodone-homatropine (HYCODAN) 5-1.5 MG/5ML syrup Take 5 mLs by mouth every 8 (eight) hours as needed for cough. 120 mL 0  . levalbuterol (XOPENEX) 1.25 MG/3ML nebulizer solution Take 1.25 mg by nebulization every 8 (eight) hours as needed for wheezing. 72 mL 12  . levothyroxine (SYNTHROID, LEVOTHROID) 50 MCG tablet TAKE 1 TABLET (50 MCG TOTAL) BY MOUTH DAILY BEFORE BREAKFAST. 90 tablet 3  . naproxen (NAPROSYN) 250 MG tablet Take 250 mg by mouth 2 (two) times daily as needed for mild pain.     . OXYGEN Inhale 2 L into the lungs as needed (shortness of breath).     Marland Kitchen  Polyethyl Glycol-Propyl Glycol (SYSTANE) 0.4-0.3 % SOLN Place 1 drop into both eyes daily.     . predniSONE (DELTASONE) 10 MG tablet Take 4 tablets (40 mg total) by mouth daily. 20 tablet 0  . simvastatin (ZOCOR) 40 MG tablet Take 1 tablet (40 mg total) by mouth at bedtime. 90 tablet 3  . tiZANidine (ZANAFLEX) 4 MG tablet TAKE 1 TABLET (4 MG TOTAL) BY MOUTH EVERY 6 (SIX) HOURS AS NEEDED FOR MUSCLE SPASMS. 30 tablet 0  . verapamil (CALAN-SR) 120 MG CR tablet Take 1 tablet (120 mg total) by mouth daily. 90 tablet 3  . zolpidem (AMBIEN) 5 MG tablet Take 5 mg by mouth at bedtime as needed for sleep.    . cycloSPORINE (RESTASIS) 0.05 % ophthalmic emulsion Place 1 drop into both eyes 2 (two) times daily.       . fluticasone (FLONASE) 50 MCG/ACT nasal spray USE 2 SPRAYS EACH SIDE ONCE A DAY 48 g 11  . omeprazole (PRILOSEC) 20 MG capsule Take 20 mg by mouth daily as needed.      No facility-administered medications prior to visit.      Allergies:   Meloxicam   Social History   Social History  . Marital status: Widowed    Spouse name: N/A  . Number of children: 0  . Years of education: N/A   Occupational History  .  [employer]   Social History Main Topics  . Smoking status: Former Smoker    Quit date: 07/24/2005  . Smokeless tobacco: Never Used  . Alcohol use Yes     Comment: rare  . Drug use: No  . Sexual activity: Not Asked   Other Topics Concern  . None   Social History Narrative  . None     Family History:  The patient's family history includes Breast cancer in her maternal aunt; Dementia in her mother; Diabetes in her brother; Healthy in her father and sister; Heart attack in her maternal grandmother, mother, and other; Heart disease in her mother; Prostate cancer in her maternal grandfather.     ROS:   Please see the history of present illness.    ROS All other systems reviewed and are negative.   PHYSICAL EXAM:   VS:  BP 115/78   Pulse 73   Ht 5' 5.5" (1.664 m)    GEN: Well nourished, well developed, in no acute distress  HEENT: normal  Neck: no JVD, carotid bruits, or masses Cardiac: RRR; no murmurs, rubs, or gallops,no edema  Respiratory:  clear to auscultation bilaterally, normal work of breathing GI: soft, nontender, nondistended, + BS MS: no deformity or atrophy  Skin: warm and dry, no rash Neuro:  Alert and Oriented x 3, Strength and sensation are intact Psych: euthymic mood, full affect    Wt Readings from Last 3 Encounters:  06/09/16 176 lb 1.9 oz (79.9 kg)  05/01/16 180 lb (81.6 kg)  04/25/16 182 lb (82.6 kg)      Studies/Labs Reviewed:   EKG:  EKG is ordered today.  The ekg ordered today demonstrates sinus with short PR, HR  78  Recent Labs: 11/04/2015: ALT 14; TSH 2.04 04/25/2016: BUN 10; Creatinine, Ser 0.91; Hemoglobin 15.8; Platelets 394; Potassium 3.7; Sodium 140   Lipid Panel    Component Value Date/Time   CHOL 178 12/14/2014 1046   TRIG 218 (H) 12/14/2014 1046   HDL 44 (L) 12/14/2014 1046   CHOLHDL 4.0 12/14/2014 1046   VLDL 44 (H) 12/14/2014 1046  [place] 90 12/14/2014 1046    Additional studies/ records that were reviewed today include:  Chest x-ray 04/25/16: IMPRESSION: Mild cardiomegaly without overt pulmonary edema. No acute pulmonary disease. Possible small left pleural effusion. Aortic atherosclerosis.   ASSESSMENT & PLAN:   PSVT: this has been quiescent on Calan SR. Will continue this.   COPD: stable  Aortic atherosclerosis: continue ASA and statin   Hypothyroidism: TSH was normal back in 10/2015  Medication Adjustments/Labs and Tests Ordered: Current medicines are reviewed at length with the patient today.  Concerns regarding medicines are outlined above.  Medication changes, Labs and Tests ordered today are listed in the Patient Instructions below. Patient Instructions  Medication Instructions:  Your physician recommends that you continue on your current medications as directed. Please refer to the Current Medication list given to you today.   Labwork: None ordered  Testing/Procedures: None ordered  Follow-Up: Your physician wants you to follow-up in: [place] will receive a reminder letter in the mail two months in advance. If you don't receive a letter, please call our office to schedule the follow-up appointment.    Any Other Special Instructions Will Be Listed Below (If Applicable).     If you need a refill on your cardiac medications before your next appointment, please call your pharmacy.      Signed, [name], PA-C  07/13/2016 10:26 AM    [facility] [address], [place], [place]  [address] Phone:  [phone]; Fax: [phone]

## 2016-07-13 ENCOUNTER — Ambulatory Visit (INDEPENDENT_AMBULATORY_CARE_PROVIDER_SITE_OTHER): Payer: Medicare HMO | Admitting: Physician Assistant

## 2016-07-13 ENCOUNTER — Encounter (INDEPENDENT_AMBULATORY_CARE_PROVIDER_SITE_OTHER): Payer: Self-pay

## 2016-07-13 ENCOUNTER — Encounter: Payer: Self-pay | Admitting: Physician Assistant

## 2016-07-13 VITALS — BP 115/78 | HR 73 | Ht 65.5 in

## 2016-07-13 DIAGNOSIS — J449 Chronic obstructive pulmonary disease, unspecified: Secondary | ICD-10-CM

## 2016-07-13 DIAGNOSIS — I700 Atherosclerosis of aorta: Secondary | ICD-10-CM

## 2016-07-13 DIAGNOSIS — E039 Hypothyroidism, unspecified: Secondary | ICD-10-CM

## 2016-07-13 DIAGNOSIS — I471 Supraventricular tachycardia: Secondary | ICD-10-CM | POA: Diagnosis not present

## 2016-07-13 NOTE — Patient Instructions (Signed)
Medication Instructions:  Your physician recommends that you continue on your current medications as directed. Please refer to the Current Medication list given to you today.   Labwork: None ordered  Testing/Procedures: None ordered  Follow-Up: Your physician wants you to follow-up in: 6 MONTHS WITH DR. SMITH You will receive a reminder letter in the mail two months in advance. If you don't receive a letter, please call our office to schedule the follow-up appointment.   Any Other Special Instructions Will Be Listed Below (If Applicable).     If you need a refill on your cardiac medications before your next appointment, please call your pharmacy.   

## 2016-07-17 ENCOUNTER — Emergency Department (HOSPITAL_COMMUNITY)
Admission: EM | Admit: 2016-07-17 | Discharge: 2016-07-17 | Disposition: A | Discharge status: Home / Self Care | Payer: Medicare HMO | Attending: Emergency Medicine | Admitting: Emergency Medicine

## 2016-07-17 ENCOUNTER — Encounter (HOSPITAL_COMMUNITY): Payer: Self-pay | Admitting: *Deleted

## 2016-07-17 DIAGNOSIS — Z87891 Personal history of nicotine dependence: Secondary | ICD-10-CM | POA: Insufficient documentation

## 2016-07-17 DIAGNOSIS — Z7982 Long term (current) use of aspirin: Secondary | ICD-10-CM | POA: Insufficient documentation

## 2016-07-17 DIAGNOSIS — I10 Essential (primary) hypertension: Secondary | ICD-10-CM

## 2016-07-17 DIAGNOSIS — Z79899 Other long term (current) drug therapy: Secondary | ICD-10-CM | POA: Insufficient documentation

## 2016-07-17 DIAGNOSIS — J449 Chronic obstructive pulmonary disease, unspecified: Secondary | ICD-10-CM | POA: Insufficient documentation

## 2016-07-17 DIAGNOSIS — E039 Hypothyroidism, unspecified: Secondary | ICD-10-CM | POA: Insufficient documentation

## 2016-07-17 LAB — BASIC METABOLIC PANEL
Anion gap: 6 (ref 5–15)
BUN: 10 mg/dL (ref 6–20)
CO2: 27 mmol/L (ref 22–32)
Calcium: 8.8 mg/dL — ABNORMAL LOW (ref 8.9–10.3)
Chloride: 106 mmol/L (ref 101–111)
Creatinine, Ser: 0.75 mg/dL (ref 0.44–1.00)
GFR calc Af Amer: >60 mL/min (ref >60)
Glucose, Bld: 99 mg/dL (ref 65–99)
POTASSIUM: 3.5 mmol/L (ref 3.5–5.1)
SODIUM: 139 mmol/L (ref 135–145)

## 2016-07-17 LAB — CBC WITH DIFFERENTIAL/PLATELET
BASOS ABS: 0 10*3/uL (ref 0.0–0.1)
Basophils Relative: 0 %
EOS ABS: 0.3 10*3/uL (ref 0.0–0.7)
EOS PCT: 3 %
HCT: 40.3 % (ref 36.0–46.0)
HEMOGLOBIN: 13.5 g/dL (ref 12.0–15.0)
LYMPHS ABS: 3.2 10*3/uL (ref 0.7–4.0)
LYMPHS PCT: 33 %
MCH: 28.8 pg (ref 26.0–34.0)
MCHC: 33.5 g/dL (ref 30.0–36.0)
MCV: 86.1 fL (ref 78.0–100.0)
Monocytes Absolute: 0.6 10*3/uL (ref 0.1–1.0)
Monocytes Relative: 7 %
NEUTROS PCT: 57 %
Neutro Abs: 5.4 10*3/uL (ref 1.7–7.7)
PLATELETS: 329 10*3/uL (ref 150–400)
RBC: 4.68 MIL/uL (ref 3.87–5.11)
RDW: 14 % (ref 11.5–15.5)
WBC: 9.5 10*3/uL (ref 4.0–10.5)

## 2016-07-17 NOTE — ED Provider Notes (Signed)
[facility] DEPT Provider Note   CSN: [id number] Arrival date & time: 07/17/16  1707     History   Chief Complaint Chief Complaint  Patient presents with  . Hypertension    HPI Mary Blevins is a 71 y.o. female.  HPI Patient presents to the emergency room for evaluation of blood pressure and lightheadedness.  Patient states she was having some mild lightheadedness today. She is not having any trouble with chest pain or shortness of breath. No weakness. She was recently diagnosed with hypertension so she checked her blood pressure. Limited at 710 systolic. Patient was concerned so she came to the emergency room for evaluation. She denies any difficulty with her speech. No focal numbness or weakness. No vertigo. No vomiting or diarrhea. Past Medical History:  Diagnosis Date  . Arthritis   . COPD (chronic obstructive pulmonary disease) ([place])   . Elevated lipids   . Generalized headaches   . History of pneumonia   . Hyperlipidemia   . Hypothyroidism   . MVP (mitral valve prolapse)    palpitations  . Osteopenia   . Seasonal allergies   . Sinusitis   . Tubular adenoma of colon     Patient Active Problem List   Diagnosis Date Noted  . Aortic atherosclerosis ([place]) 06/09/2016  . Benign neoplasm of ascending colon   . Benign neoplasm of transverse colon   . Benign neoplasm of sigmoid colon   . Diverticulosis of sigmoid colon   . Grade I internal hemorrhoids   . PSVT (paroxysmal supraventricular tachycardia) ([place]) 02/17/2015  . Back pain 07/22/2012  . Acute pain of left hip 07/22/2012  . Stricture and stenosis of esophagus 09/18/2011  . Hx of colonic polyps 09/18/2011  . COPD (chronic obstructive pulmonary disease) ([place]) 07/14/2010  . Generalized headaches 07/14/2010  . Osteopenia 07/14/2010  . Hypothyroid 07/14/2010  . Elevated lipids 07/14/2010    Past Surgical History:  Procedure Laterality Date  . COLONOSCOPY WITH PROPOFOL N/A 01/06/2016   Procedure:  COLONOSCOPY WITH PROPOFOL;  Surgeon: Doran Stabler, MD;  Location: [facility];  Service: Gastroenterology;  Laterality: N/A;  . DILATION AND CURETTAGE OF UTERUS    . THYROIDECTOMY    . TONSILLECTOMY      OB History    No data available       Home Medications    Prior to Admission medications   Medication Sig Start Date End Date Taking? Authorizing Provider  albuterol (PROVENTIL HFA;VENTOLIN HFA) 108 (90 Base) MCG/ACT inhaler Inhale 2 puffs into the lungs every 6 (six) hours as needed for wheezing or shortness of breath. 03/27/16  Yes [name], MD  aspirin 81 MG tablet Take 81 mg by mouth daily.     Yes [provider]  benzonatate (TESSALON) 200 MG capsule Take 1 capsule (200 mg total) by mouth every 8 (eight) hours as needed for cough. 02/01/16  Yes [name], MD  budesonide-formoterol (SYMBICORT) 160-4.5 MCG/ACT inhaler INHALE 2 PUFFS INTO THE LUNGS 2 (TWO) TIMES DAILY. 11/25/15  Yes [name], MD  HYDROcodone-homatropine [facility]) 5-1.5 MG/5ML syrup Take 5 mLs by mouth every 8 (eight) hours as needed for cough. 06/30/16  Yes [name], MD  levalbuterol [facility]) 1.25 MG/3ML nebulizer solution Take 1.25 mg by nebulization every 8 (eight) hours as needed for wheezing. 08/27/12  Yes [name], MD  levothyroxine (SYNTHROID, LEVOTHROID) 50 MCG tablet TAKE 1 TABLET (50 MCG TOTAL) BY MOUTH DAILY BEFORE BREAKFAST. 03/31/16  Yes  [name], MD  OXYGEN Inhale 2 L into the lungs as needed (shortness of breath).    Yes [provider]  Polyethyl Glycol-Propyl Glycol (SYSTANE) 0.4-0.3 % SOLN Place 1 drop into both eyes daily.    Yes [provider]  simvastatin (ZOCOR) 40 MG tablet Take 1 tablet (40 mg total) by mouth at bedtime. 03/31/16  Yes [name], MD  tiZANidine (ZANAFLEX) 4 MG tablet TAKE 1 TABLET (4 MG TOTAL) BY MOUTH EVERY 6 (SIX) HOURS AS NEEDED FOR MUSCLE SPASMS. 10/29/15  Yes [name], MD  verapamil  (CALAN-SR) 120 MG CR tablet Take 1 tablet (120 mg total) by mouth daily. 06/09/16  Yes Belva Crome, MD  zolpidem (AMBIEN) 5 MG tablet Take 5 mg by mouth at bedtime as needed for sleep.   Yes [provider]  predniSONE (DELTASONE) 10 MG tablet Take 4 tablets (40 mg total) by mouth daily. Patient not taking: Reported on 07/17/2016 04/25/16   [name], MD    Family History Family History  Problem Relation Age of Onset  . Breast cancer Maternal Aunt   . Heart attack Maternal Grandmother   . Heart attack Mother   . Heart disease Mother   . Dementia Mother   . Diabetes Brother   . Prostate cancer Maternal Grandfather   . Healthy Father   . Healthy Sister   . Heart attack Other        NIECE  . Colon cancer Neg Hx   . Esophageal cancer Neg Hx   . Rectal cancer Neg Hx   . Stomach cancer Neg Hx     Social History Social History  Substance Use Topics  . Smoking status: Former Smoker    Quit date: 07/24/2005  . Smokeless tobacco: Never Used  . Alcohol use Yes     Comment: rare     Allergies   Meloxicam   Review of Systems Review of Systems  All other systems reviewed and are negative.    Physical Exam Updated Vital Signs BP (!) 147/93   Pulse 70   Temp 98.5 F (36.9 C) (Oral)   Resp (!) 21   Ht 1.664 m (5' 5.5")   Wt 82.6 kg (182 lb)   SpO2 100%   BMI 29.83 kg/m   Physical Exam  Constitutional: She appears well-developed and well-nourished. No distress.  HENT:  Head: Normocephalic and atraumatic.  Right Ear: External ear normal.  Left Ear: External ear normal.  Eyes: Conjunctivae are normal. Right eye exhibits no discharge. Left eye exhibits no discharge. No scleral icterus.  Neck: Neck supple. No tracheal deviation present.  Cardiovascular: Normal rate, regular rhythm and intact distal pulses.   Pulmonary/Chest: Effort normal and breath sounds normal. No stridor. No respiratory distress. She has no wheezes. She has no rales.  Abdominal: Soft.  Bowel sounds are normal. She exhibits no distension. There is no tenderness. There is no rebound and no guarding.  Musculoskeletal: She exhibits no edema or tenderness.  Neurological: She is alert. She has normal strength. No cranial nerve deficit (no facial droop, extraocular movements intact, no slurred speech) or sensory deficit. She exhibits normal muscle tone. She displays no seizure activity. Coordination normal.  Skin: Skin is warm and dry. No rash noted.  Psychiatric: She has a normal mood and affect.  Nursing note and vitals reviewed.    ED Treatments / Results  Labs (all labs ordered are listed, but only abnormal results are displayed) Labs Reviewed  BASIC METABOLIC PANEL - Abnormal; Notable for the following:       Result Value   Calcium 8.8 (*)    All other components within normal limits  CBC WITH DIFFERENTIAL/PLATELET    EKG  EKG Interpretation  Date/Time:  Monday July 17 2016 19:57:25 EDT Ventricular Rate:  69 PR Interval:    QRS Duration: 88 QT Interval:  416 QTC Calculation: 446 R Axis:   83 Text Interpretation:  Sinus rhythm Ventricular premature complex , new since last tracing Borderline right axis deviation Anteroseptal infarct, old Borderline T abnormalities, inferior leads Since last tracing rate slower Confirmed by Dorie Rank [phone]) on 07/17/2016 8:06:46 PM       Procedures Procedures (including critical care time)  Medications Ordered in ED Medications - No data to display   Initial Impression / Assessment and Plan / ED Course  I have reviewed the triage vital signs and the nursing notes.  Pertinent labs & imaging results that were available during my care of the patient were reviewed by me and considered in my medical decision making (see chart for details).   Pt  Presented to the emergency room with complaints of mild lightheadedness. Patient denied any syncope. No chest pain or shortness of breath. No focal neurologic symptoms. She was  concerned because of her recent diagnosis of hypertension. Vital signs are reassuring here. Laboratory tests are unremarkable.  At this time there does not appear to be any evidence of an acute emergency medical condition and the patient appears stable for discharge with appropriate outpatient follow up.   Final Clinical Impressions(s) / ED Diagnoses   Final diagnoses:  Essential hypertension    New Prescriptions New Prescriptions   No medications on file     Dorie Rank, MD 07/17/16 2243

## 2016-07-17 NOTE — ED Notes (Signed)
Gave EKG to DR.Lacinda Axon

## 2016-07-17 NOTE — ED Triage Notes (Signed)
States  CNA took her blood pressure and got a high reading, decided to come in for recheck

## 2016-07-17 NOTE — ED Triage Notes (Signed)
States her daughter recently moved out and she is having problems adjusting to the situation.

## 2016-07-17 NOTE — ED Notes (Signed)
Pt reports recently loosing a loved one while they were incarcerated and has been getting upset frequently d/t this. States she has had increased fatigue. Denies HA, N/V/D/, blurred vision, or missed medication.

## 2016-07-17 NOTE — Discharge Instructions (Signed)
Follow up with your primary care doctor, return as needed for worsening symptoms °

## 2016-07-18 ENCOUNTER — Telehealth: Payer: Self-pay | Admitting: Interventional Cardiology

## 2016-07-18 NOTE — Telephone Encounter (Signed)
Called and spoke with patient, she states that she went to the ER yesterday 6/25 due to her BP rising and dropping.   Readings include 130/50 158/108 166/111 128/80 148/86  She wants to know if the calan medication is what could be causing this. She has not taken a pill this morning and doesn't want to until she gets clarification from her doctor. Will route to Dr. Tamala Julian for his advisement.    HC

## 2016-07-18 NOTE — Telephone Encounter (Signed)
New message    Pt is calling asking for a call back from RN. She has a question about medication she was started on at her last visit. Please call.

## 2016-07-19 NOTE — Telephone Encounter (Signed)
Continue Calan. None af the recordings are too low. Some are too high. May need more Calan or additional therapy Continue to monitor BP. In addition to helping with rapid HR spells, Calan will also help with lowering BP

## 2016-07-20 NOTE — Telephone Encounter (Signed)
Called patient, unable to reach, left message to give us a call back. 

## 2016-07-21 ENCOUNTER — Telehealth: Payer: Self-pay | Admitting: Interventional Cardiology

## 2016-07-21 ENCOUNTER — Telehealth: Payer: Self-pay | Admitting: Family Medicine

## 2016-07-21 DIAGNOSIS — M8589 Other specified disorders of bone density and structure, multiple sites: Secondary | ICD-10-CM | POA: Diagnosis not present

## 2016-07-21 DIAGNOSIS — Z1231 Encounter for screening mammogram for malignant neoplasm of breast: Secondary | ICD-10-CM | POA: Diagnosis not present

## 2016-07-21 LAB — HM MAMMOGRAPHY

## 2016-07-21 LAB — HM DEXA SCAN

## 2016-07-21 NOTE — Telephone Encounter (Signed)
New message ° ° ° °Pt is returning call to Jennifer. °

## 2016-07-21 NOTE — Telephone Encounter (Signed)
Pharm aware via fax

## 2016-07-21 NOTE — Telephone Encounter (Signed)
See phone note from 6/26

## 2016-07-21 NOTE — Telephone Encounter (Signed)
Okay to contiunue both meds, we will monitor

## 2016-07-21 NOTE — Telephone Encounter (Signed)
Left message to call back  

## 2016-07-21 NOTE — Telephone Encounter (Signed)
Spoke with pt and went over recommendations per Dr. Tamala Julian.  Pt verbalized understanding and was in agreement this plan.

## 2016-07-21 NOTE — Telephone Encounter (Signed)
Received fax from pharm stating Caution is advised with simvastatin dose >10mg /D along with Verapamil d/t increased myopathy risk. They want to make sure we are aware of this and send form back to them. I have form if OK will fax back.

## 2016-07-28 ENCOUNTER — Telehealth: Payer: Self-pay | Admitting: Interventional Cardiology

## 2016-07-28 NOTE — Telephone Encounter (Signed)
Spoke with pt and made her aware best time to take medication is in the morning.  Pt states this is what she has been doing and was appreciative for call.

## 2016-07-28 NOTE — Telephone Encounter (Signed)
Mary Blevins is calling to find out when is the best time for her to take her Verapamil 120 mg. Is the best time Morning , Noon or Evening or does it matter . Please call

## 2016-07-31 DIAGNOSIS — H2513 Age-related nuclear cataract, bilateral: Secondary | ICD-10-CM | POA: Diagnosis not present

## 2016-07-31 DIAGNOSIS — H40013 Open angle with borderline findings, low risk, bilateral: Secondary | ICD-10-CM | POA: Diagnosis not present

## 2016-08-04 ENCOUNTER — Encounter: Payer: Self-pay | Admitting: *Deleted

## 2016-08-08 ENCOUNTER — Telehealth: Payer: Self-pay | Admitting: Interventional Cardiology

## 2016-08-08 NOTE — Telephone Encounter (Signed)
[facility] to clarify that per Maude Leriche, LPN, Dr. Thompson Caul nurse, that Dr. Tamala Julian is aware that pt is taking simvastatin 40 mg and verapamil and that Dr. Tamala Julian knows the interaction between these medications increased myopathy risk and that it was ok to refill these medications. Pharmacist verbalized understanding.

## 2016-09-05 ENCOUNTER — Encounter: Payer: Self-pay | Admitting: Family Medicine

## 2016-09-05 ENCOUNTER — Ambulatory Visit (INDEPENDENT_AMBULATORY_CARE_PROVIDER_SITE_OTHER): Payer: Medicare HMO | Admitting: Family Medicine

## 2016-09-05 VITALS — BP 108/80 | HR 82 | Temp 98.5°F | Resp 14 | Ht 65.5 in | Wt 173.0 lb

## 2016-09-05 DIAGNOSIS — M7581 Other shoulder lesions, right shoulder: Secondary | ICD-10-CM | POA: Diagnosis not present

## 2016-09-05 MED ORDER — DICLOFENAC SODIUM 75 MG PO TBEC: 75.0000 mg | DELAYED_RELEASE_TABLET | Freq: Two times a day (BID) | ORAL | 0 refills | Status: DC

## 2016-09-05 MED ORDER — MOMETASONE FUROATE 0.1 % EX CREA: 1.0000 "application " | TOPICAL_CREAM | Freq: Every day | CUTANEOUS | 0 refills | Status: DC

## 2016-09-05 NOTE — Progress Notes (Signed)
Subjective:    Patient ID: Mary Blevins, female    DOB: [birth year], 71 y.o.   MRN: [id number]  HPI Patient reports a one-week history of pain in her right shoulder with abduction greater than 90. She also has pain with internal and external rotation. Pain is primarily over the distal attachment of the supraspinatus deep within hte deltoid.  That is where she points when she describes the pain. She has a positive empty can sign. Positive Hawkins sign. She has pain with abduction greater than 90 Past Medical History:  Diagnosis Date  . Arthritis   . COPD (chronic obstructive pulmonary disease) ([place])   . Elevated lipids   . Generalized headaches   . History of pneumonia   . Hyperlipidemia   . Hypothyroidism   . MVP (mitral valve prolapse)    palpitations  . Osteopenia   . Seasonal allergies   . Sinusitis   . Tubular adenoma of colon    Past Surgical History:  Procedure Laterality Date  . COLONOSCOPY WITH PROPOFOL N/A 01/06/2016   Procedure: COLONOSCOPY WITH PROPOFOL;  Surgeon: Doran Stabler, MD;  Location: [facility];  Service: Gastroenterology;  Laterality: N/A;  . DILATION AND CURETTAGE OF UTERUS    . THYROIDECTOMY    . TONSILLECTOMY     Current Outpatient Prescriptions on File Prior to Visit  Medication Sig Dispense Refill  . albuterol (PROVENTIL HFA;VENTOLIN HFA) 108 (90 Base) MCG/ACT inhaler Inhale 2 puffs into the lungs every 6 (six) hours as needed for wheezing or shortness of breath. 1 Inhaler 5  . aspirin 81 MG tablet Take 81 mg by mouth daily.      . benzonatate (TESSALON) 200 MG capsule Take 1 capsule (200 mg total) by mouth every 8 (eight) hours as needed for cough. 30 capsule 0  . budesonide-formoterol (SYMBICORT) 160-4.5 MCG/ACT inhaler INHALE 2 PUFFS INTO THE LUNGS 2 (TWO) TIMES DAILY. 10.2 Inhaler 2  . HYDROcodone-homatropine (HYCODAN) 5-1.5 MG/5ML syrup Take 5 mLs by mouth every 8 (eight) hours as needed for cough. 120 mL 0  . levalbuterol (XOPENEX)  1.25 MG/3ML nebulizer solution Take 1.25 mg by nebulization every 8 (eight) hours as needed for wheezing. 72 mL 12  . levothyroxine (SYNTHROID, LEVOTHROID) 50 MCG tablet TAKE 1 TABLET (50 MCG TOTAL) BY MOUTH DAILY BEFORE BREAKFAST. 90 tablet 3  . OXYGEN Inhale 2 L into the lungs as needed (shortness of breath).     Vladimir Faster Glycol-Propyl Glycol (SYSTANE) 0.4-0.3 % SOLN Place 1 drop into both eyes daily.     . simvastatin (ZOCOR) 40 MG tablet Take 1 tablet (40 mg total) by mouth at bedtime. 90 tablet 3  . tiZANidine (ZANAFLEX) 4 MG tablet TAKE 1 TABLET (4 MG TOTAL) BY MOUTH EVERY 6 (SIX) HOURS AS NEEDED FOR MUSCLE SPASMS. 30 tablet 0  . verapamil (CALAN-SR) 120 MG CR tablet Take 1 tablet (120 mg total) by mouth daily. 90 tablet 3  . zolpidem (AMBIEN) 5 MG tablet Take 5 mg by mouth at bedtime as needed for sleep.     No current facility-administered medications on file prior to visit.    Allergies  Allergen Reactions  . Meloxicam Other (See Comments)    BAD DREAMS, SAD THOUGHTS   Social History   Social History  . Marital status: Widowed    Spouse name: N/A  . Number of children: 0  . Years of education: N/A   Occupational History  .  [employer]  History Main Topics  . Smoking status: Former Smoker    Quit date: 07/24/2005  . Smokeless tobacco: Never Used  . Alcohol use Yes     Comment: rare  . Drug use: No  . Sexual activity: Not on file   Other Topics Concern  . Not on file   Social History Narrative  . No narrative on file      Review of Systems  All other systems reviewed and are negative.      Objective:   Physical Exam  Cardiovascular: Normal rate, regular rhythm and normal heart sounds.   No murmur heard. Pulmonary/Chest: Effort normal and breath sounds normal.  Musculoskeletal:       Right shoulder: She exhibits decreased range of motion, tenderness, pain and decreased strength. She exhibits no bony tenderness, no swelling, no  effusion and no crepitus.  Vitals reviewed.         Assessment & Plan:  Right rotator cuff tendinitis  I believe the patient has tendinitis in her rotator cuff/impingement syndrome. Begin diclofenac 75 mg by mouth twice a day for 1-2 weeks. If no better, return for an x-ray of the right shoulder as well as a cortisone injection into the subacromial space. Also give the patient a prescription for Elocon cream to apply to her shins once a day. She has several bug bites on her shins from walking in tall grass that are very itchy

## 2016-09-11 ENCOUNTER — Encounter: Payer: Self-pay | Admitting: Family Medicine

## 2016-09-27 ENCOUNTER — Encounter: Payer: Self-pay | Admitting: Family Medicine

## 2016-12-20 ENCOUNTER — Other Ambulatory Visit: Payer: Self-pay

## 2016-12-20 ENCOUNTER — Encounter: Payer: Self-pay | Admitting: Family Medicine

## 2016-12-20 ENCOUNTER — Ambulatory Visit: Payer: Medicare HMO | Admitting: Family Medicine

## 2016-12-20 VITALS — BP 110/70 | HR 82 | Temp 98.4°F | Resp 18 | Ht 65.5 in | Wt 172.0 lb

## 2016-12-20 DIAGNOSIS — F5104 Psychophysiologic insomnia: Secondary | ICD-10-CM | POA: Insufficient documentation

## 2016-12-20 DIAGNOSIS — J439 Emphysema, unspecified: Secondary | ICD-10-CM

## 2016-12-20 DIAGNOSIS — F411 Generalized anxiety disorder: Secondary | ICD-10-CM

## 2016-12-20 MED ORDER — TIZANIDINE HCL 4 MG PO TABS: ORAL_TABLET | ORAL | 0 refills | Status: DC

## 2016-12-20 MED ORDER — DICLOFENAC SODIUM 75 MG PO TBEC: 75.0000 mg | DELAYED_RELEASE_TABLET | Freq: Two times a day (BID) | ORAL | 2 refills | Status: DC

## 2016-12-20 MED ORDER — ZOLPIDEM TARTRATE 5 MG PO TABS: 5.0000 mg | ORAL_TABLET | Freq: Every evening | ORAL | 2 refills | Status: DC | PRN

## 2016-12-20 MED ORDER — ALPRAZOLAM 0.25 MG PO TABS: 0.2500 mg | ORAL_TABLET | Freq: Two times a day (BID) | ORAL | 0 refills | Status: DC | PRN

## 2016-12-20 NOTE — Progress Notes (Signed)
   Subjective:    Patient ID: Mary Blevins, female    DOB: 11/01/1945, 71 y.o.   MRN: [id number]  Patient presents for COPD Exacerbation (states that she had bad episode over the weekend and want to have lungs checked for fluid)  Patient here to have her lungs checked.  States that she had a bad episode over the weekend where she felt short of breath.  She has underlying COPD she is on Symbicort as well as albuterol. Has had some wheezing since the weather changed. None currently No chest pain, no cough currently  No fever  She did have to use albuterol once Feels well right now  She does admit she gets some anxiety if she feels her breathing changes and it worsens things, was on xanax in past but only took very sparingly would like to refill Also needs refill on her ambien 5mg  takes prn  Review Of Systems:  GEN- denies fatigue, fever, weight loss,weakness, recent illness HEENT- denies eye drainage, change in vision, nasal discharge, CVS- denies chest pain, palpitations RESP-+SOB, cough, wheeze ABD- denies N/V, change in stools, abd pain GU- denies dysuria, hematuria, dribbling, incontinence MSK- denies joint pain, muscle aches, injury Neuro- denies headache, dizziness, syncope, seizure activity       Objective:    BP 110/70   Pulse 82   Temp 98.4 F (36.9 C) (Oral)   Resp 18   Ht 5' 5.5" (1.664 m)   Wt 172 lb (78 kg)   SpO2 96%   BMI 28.19 kg/m  GEN- NAD, alert and oriented x3 HEENT- PERRL, EOMI, non injected sclera, pink conjunctiva, MMM, oropharynx clear Neck- Supple, no LAD, no JVD CVS- RRR, no murmur RESP-CTAB,speaking in full sentences ABD-NABS,soft,NT,ND EXT- No edema Pulses- Radial  2+        Assessment & Plan:      Problem List Items Addressed This Visit      Unprioritized   COPD (chronic obstructive pulmonary disease) ([facility]) - Primary   Chronic insomnia   GAD (generalized anxiety disorder)    Given xanax 0.25mg  to try if she feels  anxious I do not think this was COPD eacerbation based on symptoms and current presentation But she will call for any changes Ambien also refilled She was given 2 samples of symbicort due to cost      Relevant Medications   ALPRAZolam (XANAX) 0.25 MG tablet      Note: This dictation was prepared with Dragon dictation along with smaller phrase technology. Any transcriptional errors that result from this process are unintentional.

## 2016-12-20 NOTE — Patient Instructions (Signed)
Diclofeanc for your arthritis refilled Xanax for your anxiety as needed  Continue your symbicort Ambien refilled for sleeping  F/U as needed

## 2016-12-21 ENCOUNTER — Telehealth: Payer: Self-pay | Admitting: Family Medicine

## 2016-12-21 NOTE — Assessment & Plan Note (Signed)
Given xanax 0.25mg  to try if she feels anxious I do not think this was COPD eacerbation based on symptoms and current presentation But she will call for any changes Ambien also refilled She was given 2 samples of symbicort due to cost

## 2016-12-21 NOTE — Telephone Encounter (Signed)
Medication called to pharmacy. 

## 2016-12-21 NOTE — Telephone Encounter (Signed)
Patient was in yesterday and said a low dose anxiety med was supposed to be called into pharmacy and it was not there please call and advise [phone]

## 2017-01-24 ENCOUNTER — Other Ambulatory Visit: Payer: Self-pay

## 2017-01-24 ENCOUNTER — Ambulatory Visit (INDEPENDENT_AMBULATORY_CARE_PROVIDER_SITE_OTHER): Payer: Medicare HMO | Admitting: Physician Assistant

## 2017-01-24 ENCOUNTER — Encounter: Payer: Self-pay | Admitting: Physician Assistant

## 2017-01-24 VITALS — BP 128/82 | HR 79 | Temp 98.1°F | Resp 14 | Wt 171.4 lb

## 2017-01-24 DIAGNOSIS — F411 Generalized anxiety disorder: Secondary | ICD-10-CM

## 2017-01-24 DIAGNOSIS — J439 Emphysema, unspecified: Secondary | ICD-10-CM | POA: Diagnosis not present

## 2017-01-24 DIAGNOSIS — R69 Illness, unspecified: Secondary | ICD-10-CM | POA: Diagnosis not present

## 2017-01-24 NOTE — Progress Notes (Signed)
Patient ID: DEVAN BABINO MRN: [id number], DOB: 26-Nov-1945, 72 y.o. Date of Encounter: 01/24/2017, 10:16 AM    Chief Complaint:  Chief Complaint  Patient presents with  . Cough    x3days  . Shortness of Breath  . Wheezing     HPI: 72 y.o. year old female presents with above.   I reviewed her office note with Dr. Buelah Manis 12/20/16. I also reviewed her office note with Dr. Dennard Schaumann 03/24/2016. Also reviewed that she has history of SVT.  Today she reports that on Monday she went out to do some errands and used her inhaler because she was feeling a little short of breath while doing those errands.  Says that later that evening she felt a little bit of shortness of breath but sat down and that resolved. Reports that she has had a dry hacking cough for a few days.  At times feels like she may have some wheezing.  Says this has been off and on since Monday. Asked if she was getting anything out of her nose or having nasal congestion.  Replies that she has had "little runny nose ".  Otherwise not really blowing much out of her nose. Says she "wanted to run in here and check to make sure she didn't have fluid on my lungs" At end of visit mentions that "her neice had to go to the hospital with blood clot in her lung"--- adds--" but she just had knee surgery and they think it is because of that surgery" Comments "these things just make me nervous, I guess"  Regarding h/o SVT--She has not felt any rapid heart rate or palpitations or ongoing shortness of breath.     Home Meds:   Outpatient Medications Prior to Visit  Medication Sig Dispense Refill  . albuterol (PROVENTIL HFA;VENTOLIN HFA) 108 (90 Base) MCG/ACT inhaler Inhale 2 puffs into the lungs every 6 (six) hours as needed for wheezing or shortness of breath. 1 Inhaler 5  . ALPRAZolam (XANAX) 0.25 MG tablet Take 1 tablet (0.25 mg total) by mouth 2 (two) times daily as needed for anxiety. 20 tablet 0  . aspirin 81 MG tablet Take 81 mg  by mouth daily.      . budesonide-formoterol (SYMBICORT) 160-4.5 MCG/ACT inhaler INHALE 2 PUFFS INTO THE LUNGS 2 (TWO) TIMES DAILY. 10.2 Inhaler 2  . diclofenac (VOLTAREN) 75 MG EC tablet Take 1 tablet (75 mg total) by mouth 2 (two) times daily. 60 tablet 2  . HYDROcodone-homatropine (HYCODAN) 5-1.5 MG/5ML syrup Take 5 mLs by mouth every 8 (eight) hours as needed for cough. 120 mL 0  . levalbuterol (XOPENEX) 1.25 MG/3ML nebulizer solution Take 1.25 mg by nebulization every 8 (eight) hours as needed for wheezing. 72 mL 12  . levothyroxine (SYNTHROID, LEVOTHROID) 50 MCG tablet TAKE 1 TABLET (50 MCG TOTAL) BY MOUTH DAILY BEFORE BREAKFAST. 90 tablet 3  . mometasone (ELOCON) 0.1 % cream Apply 1 application topically daily. 45 g 0  . OXYGEN Inhale 2 L into the lungs as needed (shortness of breath).     Vladimir Faster Glycol-Propyl Glycol (SYSTANE) 0.4-0.3 % SOLN Place 1 drop into both eyes daily.     . simvastatin (ZOCOR) 40 MG tablet Take 1 tablet (40 mg total) by mouth at bedtime. 90 tablet 3  . tiZANidine (ZANAFLEX) 4 MG tablet TAKE 1 TABLET (4 MG TOTAL) BY MOUTH EVERY 6 (SIX) HOURS AS NEEDED FOR MUSCLE SPASMS. 30 tablet 0  . verapamil (CALAN-SR) 120 MG CR tablet  Take 1 tablet (120 mg total) by mouth daily. 90 tablet 3  . zolpidem (AMBIEN) 5 MG tablet Take 1 tablet (5 mg total) by mouth at bedtime as needed for sleep. 30 tablet 2   No facility-administered medications prior to visit.     Allergies:  Allergies  Allergen Reactions  . Meloxicam Other (See Comments)    BAD DREAMS, SAD THOUGHTS      Review of Systems: See HPI for pertinent ROS. All other ROS negative.    Physical Exam: Blood pressure 128/82, pulse 79, temperature 98.1 F (36.7 C), temperature source Oral, resp. rate 14, weight 77.7 kg (171 lb 6.4 oz), SpO2 98 %., Body mass index is 28.09 kg/m. General:  WNWD AAF. Appears in no acute distress. HEENT: Normocephalic, atraumatic, eyes without discharge, sclera non-icteric, nares  are without discharge. Bilateral auditory canals clear, TM's are without perforation, pearly grey and translucent with reflective cone of light bilaterally. Oral cavity moist, posterior pharynx without exudate, erythema, peritonsillar abscess.  Neck: Supple. No thyromegaly. No lymphadenopathy. Lungs: Clear bilaterally to auscultation without wheezes, rales, or rhonchi. Breathing is unlabored. Heart: Regular rhythm. No murmurs, rubs, or gallops. Msk:  Strength and tone normal for age. Extremities/Skin: Warm and dry. No edema.  Neuro: Alert and oriented X 3. Moves all extremities spontaneously. Gait is normal. CNII-XII grossly in tact. Psych:  Responds to questions appropriately with a normal affect.     ASSESSMENT AND PLAN:  72 y.o. year old female with  1. Pulmonary emphysema, unspecified emphysema type ([place]) She does have a history of COPD.  She is on Symbicort and Xopenex for this.  Assured her that I hear no wheezing on exam and her lungs sound very clear on exam.  Also reassured her that her oxygen saturation is 98% on room air.  She is afebrile at 98.1.  Respirations are 14 and heart rate 79.  During the visit she did have one episode of dry hacky cough and she says that is what she has been experiencing the last couple of days off and on.  Says that she is taking Mucinex to help with that.  Recommend that she also take Zyrtec or something similar to help dry up any drainage they are that may be causing that cough.  Otherwise no other additional medications indicated at this time.  Continue the Symbicort routinely and continue to use Xopenex 2 puffs every 4 hours if needed.  If symptoms worsen then follow-up.  2. GAD (generalized anxiety disorder) I think that anxiety is contributing factor.  She comments that she "will run in here to check to make sure she does not have fluid on her lungs at the drop of a hat".  Also comments that she is nervous that niece has blood clot in lung.  I reassured  her of today's exam findings.  Follow-up if symptoms worsen.   Signed, [address] [place], [place], [facility] 01/24/2017 10:16 AM

## 2017-02-01 NOTE — Progress Notes (Signed)
Cardiology Office Note    Date:  02/02/2017   ID:  Mary Blevins, DOB [birth year], MRN [id number]  PCP:  [name], MD  Cardiologist: Sinclair Grooms, MD   Chief Complaint  Patient presents with  . Irregular Heart Beat    History of Present Illness:  Mary Blevins is a 72 y.o. female who presents for PSVT and COPD.   She is doing okay.  She has had an occasional episode of rapid heartbeat.  She denies syncope.  She denies episodes or chest pain.  Her major complaint is dyspnea on exertion she presumes is related to COPD.   Past Medical History:  Diagnosis Date  . Arthritis   . COPD (chronic obstructive pulmonary disease) ([place])   . Elevated lipids   . Generalized headaches   . History of pneumonia   . Hyperlipidemia   . Hypothyroidism   . MVP (mitral valve prolapse)    palpitations  . Osteopenia   . Seasonal allergies   . Sinusitis   . Tubular adenoma of colon     Past Surgical History:  Procedure Laterality Date  . COLONOSCOPY WITH PROPOFOL N/A 01/06/2016   Procedure: COLONOSCOPY WITH PROPOFOL;  Surgeon: Doran Stabler, MD;  Location: [facility];  Service: Gastroenterology;  Laterality: N/A;  . DILATION AND CURETTAGE OF UTERUS    . THYROIDECTOMY    . TONSILLECTOMY      Current Medications: Outpatient Medications Prior to Visit  Medication Sig Dispense Refill  . albuterol (PROVENTIL HFA;VENTOLIN HFA) 108 (90 Base) MCG/ACT inhaler Inhale 2 puffs into the lungs every 6 (six) hours as needed for wheezing or shortness of breath. 1 Inhaler 5  . ALPRAZolam (XANAX) 0.25 MG tablet Take 1 tablet (0.25 mg total) by mouth 2 (two) times daily as needed for anxiety. 20 tablet 0  . aspirin 81 MG tablet Take 81 mg by mouth daily.      . budesonide-formoterol (SYMBICORT) 160-4.5 MCG/ACT inhaler INHALE 2 PUFFS INTO THE LUNGS 2 (TWO) TIMES DAILY. 10.2 Inhaler 2  . diclofenac (VOLTAREN) 75 MG EC tablet Take 1 tablet (75 mg total) by mouth 2 (two) times  daily. 60 tablet 2  . HYDROcodone-homatropine (HYCODAN) 5-1.5 MG/5ML syrup Take 5 mLs by mouth every 8 (eight) hours as needed for cough. 120 mL 0  . levalbuterol (XOPENEX) 1.25 MG/3ML nebulizer solution Take 1.25 mg by nebulization every 8 (eight) hours as needed for wheezing. 72 mL 12  . levothyroxine (SYNTHROID, LEVOTHROID) 50 MCG tablet TAKE 1 TABLET (50 MCG TOTAL) BY MOUTH DAILY BEFORE BREAKFAST. 90 tablet 3  . OXYGEN Inhale 2 L into the lungs as needed (shortness of breath).     Vladimir Faster Glycol-Propyl Glycol (SYSTANE) 0.4-0.3 % SOLN Place 1 drop into both eyes daily.     . simvastatin (ZOCOR) 40 MG tablet Take 1 tablet (40 mg total) by mouth at bedtime. 90 tablet 3  . tiZANidine (ZANAFLEX) 4 MG tablet TAKE 1 TABLET (4 MG TOTAL) BY MOUTH EVERY 6 (SIX) HOURS AS NEEDED FOR MUSCLE SPASMS. 30 tablet 0  . verapamil (CALAN-SR) 120 MG CR tablet Take 1 tablet (120 mg total) by mouth daily. 90 tablet 3  . zolpidem (AMBIEN) 5 MG tablet Take 1 tablet (5 mg total) by mouth at bedtime as needed for sleep. 30 tablet 2  . mometasone (ELOCON) 0.1 % cream Apply 1 application topically daily. 45 g 0   No facility-administered medications prior to visit.  Allergies:   Meloxicam   Social History   Socioeconomic History  . Marital status: Widowed    Spouse name: None  . Number of children: 0  . Years of education: None  . Highest education level: None  Social Needs  . Financial resource strain: None  . Food insecurity - worry: None  . Food insecurity - inability: None  . Transportation needs - medical: None  . Transportation needs - non-medical: None  Occupational History    Employer: [employer]  Tobacco Use  . Smoking status: Former Smoker    Last attempt to quit: 07/24/2005    Years since quitting: 11.5  . Smokeless tobacco: Never Used  Substance and Sexual Activity  . Alcohol use: Yes    Comment: rare  . Drug use: No  . Sexual activity: None  Other Topics Concern    . None  Social History Narrative  . None     Family History:  The patient's family history includes Breast cancer in her maternal aunt; Dementia in her mother; Diabetes in her brother; Healthy in her father and sister; Heart attack in her maternal grandmother, mother, and other; Heart disease in her mother; Prostate cancer in her maternal grandfather.   ROS:   Please see the history of present illness.    She has difficulty with vision disturbance, shortness of breath, back pain, muscle pain, and wheezing.  She has never had a formal pulmonary evaluation. All other systems reviewed and are negative.   PHYSICAL EXAM:   VS:  BP 130/82   Pulse 86   Ht 5' 5.5" (1.664 m)   Wt 174 lb 3.2 oz (79 kg)   BMI 28.55 kg/m    GEN: Well nourished, well developed, in no acute distress  HEENT: normal  Neck: no JVD, carotid bruits, or masses Cardiac: Breath RRR; no murmurs, rubs, or gallops,no edema  Respiratory:  clear to auscultation bilaterally, normal work of breathing GI: soft, nontender, nondistended, + BS MS: no deformity or atrophy  Skin: warm and dry, no rash Neuro:  Alert and Oriented x 3, Strength and sensation are intact Psych: euthymic mood, full affect  Wt Readings from Last 3 Encounters:  02/02/17 174 lb 3.2 oz (79 kg)  01/24/17 171 lb 6.4 oz (77.7 kg)  12/20/16 172 lb (78 kg)      Studies/Labs Reviewed:   EKG:  EKG not performed  Recent Labs: 07/17/2016: BUN 10; Creatinine, Ser 0.75; Hemoglobin 13.5; Platelets 329; Potassium 3.5; Sodium 139   Lipid Panel    Component Value Date/Time   CHOL 178 12/14/2014 1046   TRIG 218 (H) 12/14/2014 1046   HDL 44 (L) 12/14/2014 1046   CHOLHDL 4.0 12/14/2014 1046   VLDL 44 (H) 12/14/2014 1046   LDLCALC 90 12/14/2014 1046    Additional studies/ records that were reviewed today include:  No new data    ASSESSMENT:    1. PSVT (paroxysmal supraventricular tachycardia) ([place])   2. Other emphysema ([place])   3. Aortic  atherosclerosis ([facility])      PLAN:  In order of problems listed above:  1. Recurrent PSVT is under control on low-dose calcium channel blocker.  No changes recommended. 2. Smoking cessation has occurred.  She has significant shortness of breath.  I would love her to have at least a one-time pulmonary evaluation to quantitate the severity of her underlying lung process.  We will see if Dr. Dennard Schaumann would agree to referring her to Dr. Luan Pulling. 3. Risk  factor modification including tight blood pressure control and lipid management with LDL less than 70.  Clinical follow-up in 1 year.  Earlier if frequent episodes of PSVT.  Consider pulmonary referral    Medication Adjustments/Labs and Tests Ordered: Current medicines are reviewed at length with the patient today.  Concerns regarding medicines are outlined above.  Medication changes, Labs and Tests ordered today are listed in the Patient Instructions below. Patient Instructions  Medication Instructions:  Your physician recommends that you continue on your current medications as directed. Please refer to the Current Medication list given to you today.  Labwork: None  Testing/Procedures: None  Follow-Up: Your physician wants you to follow-up in: 1 year with Dr. Tamala Julian.  You will receive a reminder letter in the mail two months in advance. If you don't receive a letter, please call our office to schedule the follow-up appointment.   Any Other Special Instructions Will Be Listed Below (If Applicable).     If you need a refill on your cardiac medications before your next appointment, please call your pharmacy.      Signed, Sinclair Grooms, MD  02/02/2017 11:40 AM    [facility] [place], [place], [place]  [address] Phone: [phone]; Fax: [phone]

## 2017-02-02 ENCOUNTER — Ambulatory Visit: Payer: Medicare HMO | Admitting: Interventional Cardiology

## 2017-02-02 ENCOUNTER — Encounter: Payer: Self-pay | Admitting: Interventional Cardiology

## 2017-02-02 ENCOUNTER — Encounter (INDEPENDENT_AMBULATORY_CARE_PROVIDER_SITE_OTHER): Payer: Self-pay

## 2017-02-02 VITALS — BP 130/82 | HR 86 | Ht 65.5 in | Wt 174.2 lb

## 2017-02-02 DIAGNOSIS — J438 Other emphysema: Secondary | ICD-10-CM

## 2017-02-02 DIAGNOSIS — I700 Atherosclerosis of aorta: Secondary | ICD-10-CM

## 2017-02-02 DIAGNOSIS — I471 Supraventricular tachycardia: Secondary | ICD-10-CM | POA: Diagnosis not present

## 2017-02-02 NOTE — Patient Instructions (Signed)

## 2017-02-05 ENCOUNTER — Telehealth: Payer: Self-pay | Admitting: Family Medicine

## 2017-02-05 DIAGNOSIS — R0602 Shortness of breath: Secondary | ICD-10-CM

## 2017-02-05 DIAGNOSIS — J438 Other emphysema: Secondary | ICD-10-CM

## 2017-02-05 NOTE — Telephone Encounter (Signed)
Pt called and states that she went to see Dr. Tamala Julian and he recommended she see a pulmonologist for her SOB / COPD. (per his note he did recommend this) Ok to place referral?

## 2017-02-05 NOTE — Telephone Encounter (Signed)
ok 

## 2017-02-05 NOTE — Telephone Encounter (Signed)
Pt aware and referral placed.  

## 2017-02-09 ENCOUNTER — Telehealth: Payer: Self-pay | Admitting: Interventional Cardiology

## 2017-02-09 NOTE — Telephone Encounter (Signed)
New Message   Patient is calling because Dr. Tamala Julian was to refer her to a pulmonologist in [place]. She wants to know when that will be done.Please call.

## 2017-02-09 NOTE — Telephone Encounter (Signed)
Spoke with pt and made her aware that Dr. Tamala Julian had sent info to Dr. Dennard Schaumann to see if in agreement to send pt to Dr. Luan Pulling in [place].  Pt states she spoke with Dr. Samella Parr office and they had mentioned Dr. Melvyn Novas here in [place].  Advised pt this is fine or if she preferred Dr. Luan Pulling who is more local to let Dr. Samella Parr office know.  Pt is going to contact Dr. Samella Parr office and have them refer her to Dr. Luan Pulling.

## 2017-02-13 ENCOUNTER — Telehealth: Payer: Self-pay | Admitting: Family Medicine

## 2017-02-13 NOTE — Telephone Encounter (Signed)
I am aware of this and staff message was sent to [facility] and pt was called and informed we did get her message and we will be happy to send her to Dr. Luan Pulling.

## 2017-02-13 NOTE — Telephone Encounter (Signed)
Pt does not want see Dr. Melvyn Novas, pt wants to go to Dr. Luan Pulling in [place].

## 2017-02-13 NOTE — Telephone Encounter (Signed)
I have called and cancelled the appointment with Dr. Melvyn Novas. I also called Dr. Luan Pulling office and they haven't seen patient and requested that we fax over information. I have left vm for patient to return my call so I can advise.

## 2017-02-14 NOTE — Telephone Encounter (Signed)
Patient returned my call I advised her that I had cx the appt with Dr. Melvyn Novas and that I had faxed over information to Dr. Luan Pulling office requesting an appointment.

## 2017-02-19 ENCOUNTER — Other Ambulatory Visit (HOSPITAL_COMMUNITY): Payer: Self-pay | Admitting: Respiratory Therapy

## 2017-02-19 DIAGNOSIS — J441 Chronic obstructive pulmonary disease with (acute) exacerbation: Secondary | ICD-10-CM

## 2017-02-19 DIAGNOSIS — J449 Chronic obstructive pulmonary disease, unspecified: Secondary | ICD-10-CM | POA: Diagnosis not present

## 2017-02-19 DIAGNOSIS — R69 Illness, unspecified: Secondary | ICD-10-CM | POA: Diagnosis not present

## 2017-02-19 DIAGNOSIS — I471 Supraventricular tachycardia: Secondary | ICD-10-CM | POA: Diagnosis not present

## 2017-02-19 DIAGNOSIS — I10 Essential (primary) hypertension: Secondary | ICD-10-CM | POA: Diagnosis not present

## 2017-03-01 ENCOUNTER — Ambulatory Visit (HOSPITAL_COMMUNITY)
Admission: RE | Admit: 2017-03-01 | Discharge: 2017-03-01 | Disposition: A | Discharge status: Home / Self Care | Payer: Medicare HMO | Source: Ambulatory Visit | Attending: Pulmonary Disease | Admitting: Pulmonary Disease

## 2017-03-01 DIAGNOSIS — J441 Chronic obstructive pulmonary disease with (acute) exacerbation: Secondary | ICD-10-CM | POA: Diagnosis present

## 2017-03-01 LAB — PULMONARY FUNCTION TEST
DL/VA % pred: 59 %
DL/VA: 2.97 ml/min/mmHg/L
DLCO UNC: 13.47 ml/min/mmHg
DLCO unc % pred: 51 %
FEF 25-75 Post: 0.4 L/sec
FEF 25-75 Pre: 0.4 L/sec
FEF2575-%Change-Post: 0 %
FEF2575-%PRED-POST: 23 %
FEF2575-%Pred-Pre: 23 %
FEV1-%CHANGE-POST: -3 %
FEV1-%PRED-POST: 49 %
FEV1-%PRED-PRE: 51 %
FEV1-POST: 0.95 L
FEV1-Pre: 0.99 L
FEV1FVC-%Change-Post: -4 %
FEV1FVC-%Pred-Pre: 63 %
FEV6-%Change-Post: 0 %
FEV6-%PRED-POST: 79 %
FEV6-%PRED-PRE: 79 %
FEV6-POST: 1.89 L
FEV6-PRE: 1.89 L
FEV6FVC-%CHANGE-POST: -1 %
FEV6FVC-%PRED-PRE: 97 %
FEV6FVC-%Pred-Post: 96 %
FVC-%Change-Post: 0 %
FVC-%PRED-POST: 82 %
FVC-%Pred-Pre: 81 %
FVC-Post: 2.03 L
FVC-Pre: 2.02 L
POST FEV1/FVC RATIO: 47 %
POST FEV6/FVC RATIO: 93 %
PRE FEV6/FVC RATIO: 94 %
Pre FEV1/FVC ratio: 49 %
RV % PRED: 167 %
RV: 3.88 L
TLC % pred: 121 %
TLC: 6.41 L

## 2017-03-01 MED ORDER — ALBUTEROL SULFATE (2.5 MG/3ML) 0.083% IN NEBU
2.5000 mg | INHALATION_SOLUTION | Freq: Once | RESPIRATORY_TRACT | Status: AC
  Administered 2017-03-01: 2.5 mg via RESPIRATORY_TRACT

## 2017-03-02 ENCOUNTER — Institutional Professional Consult (permissible substitution): Payer: Medicare HMO | Admitting: Internal Medicine

## 2017-03-12 ENCOUNTER — Telehealth: Payer: Self-pay | Admitting: Interventional Cardiology

## 2017-03-12 ENCOUNTER — Other Ambulatory Visit: Payer: Self-pay | Admitting: *Deleted

## 2017-03-12 MED ORDER — VERAPAMIL HCL ER 120 MG PO TBCR: 120.0000 mg | EXTENDED_RELEASE_TABLET | Freq: Every day | ORAL | 10 refills | Status: DC

## 2017-03-12 NOTE — Telephone Encounter (Signed)
New message     Patient calling to let you know she has new insurance and they needs a prescription for the  verapamil (CALAN-SR) 120 MG CR tablet Take 1 tablet (120 mg total) by mouth     [facility] [place] , they will not fill it with the old prescription.   30 day supply    *STAT* If patient is at the pharmacy, call can be transferred to refill team.   1. Which medications need to be refilled? (please list name of each medication and dose if known)  verapamil (CALAN-SR) 120 MG CR tablet Take 1 tablet (120 mg total) by mouth      2. Which pharmacy/location (including street and city if local pharmacy) is [facility] [place]   3. Do they need a 30 day or 90 day supply? [place]

## 2017-03-13 ENCOUNTER — Other Ambulatory Visit: Payer: Self-pay | Admitting: Family Medicine

## 2017-03-13 MED ORDER — LEVOTHYROXINE SODIUM 50 MCG PO TABS: ORAL_TABLET | ORAL | 3 refills | Status: DC

## 2017-03-13 MED ORDER — SIMVASTATIN 40 MG PO TABS: 40.0000 mg | ORAL_TABLET | Freq: Every day | ORAL | 3 refills | Status: DC

## 2017-03-15 ENCOUNTER — Encounter: Payer: Self-pay | Admitting: Family Medicine

## 2017-03-15 ENCOUNTER — Ambulatory Visit (INDEPENDENT_AMBULATORY_CARE_PROVIDER_SITE_OTHER): Payer: Medicare HMO | Admitting: Family Medicine

## 2017-03-15 ENCOUNTER — Other Ambulatory Visit: Payer: Self-pay

## 2017-03-15 VITALS — BP 132/70 | HR 80 | Temp 98.6°F | Resp 16 | Ht 65.5 in | Wt 176.0 lb

## 2017-03-15 DIAGNOSIS — M791 Myalgia, unspecified site: Secondary | ICD-10-CM

## 2017-03-15 DIAGNOSIS — M2550 Pain in unspecified joint: Secondary | ICD-10-CM | POA: Diagnosis not present

## 2017-03-15 MED ORDER — NAPROXEN 500 MG PO TABS: 500.0000 mg | ORAL_TABLET | Freq: Two times a day (BID) | ORAL | 0 refills | Status: DC | PRN

## 2017-03-15 NOTE — Progress Notes (Signed)
Subjective:    Patient ID: Mary Blevins, female    DOB: July 12, 1945, 72 y.o.   MRN: [id number]  HPI Patient presents complaining of stiffness in the MCP joints on both hands, the PIP joints on both hands, both knees, and in her lower back.  Stiffness is worse first thing in the morning and improves throughout the day as she is ambulatory.  It also improves if she runs the joints under warm.  She cannot take meloxicam due to sad thoughts when she takes the medicine.  She tried diclofenac and I gave her the exact same reaction.  Curiously however she could take Naprosyn without difficulty.  She has not tried any medication.  She is tried topical Aspercreme Past Medical History:  Diagnosis Date  . Arthritis   . COPD (chronic obstructive pulmonary disease) ([place])   . Elevated lipids   . Generalized headaches   . History of pneumonia   . Hyperlipidemia   . Hypothyroidism   . MVP (mitral valve prolapse)    palpitations  . Osteopenia   . Seasonal allergies   . Sinusitis   . Tubular adenoma of colon    Past Surgical History:  Procedure Laterality Date  . COLONOSCOPY WITH PROPOFOL N/A 01/06/2016   Procedure: COLONOSCOPY WITH PROPOFOL;  Surgeon: Doran Stabler, MD;  Location: [facility];  Service: Gastroenterology;  Laterality: N/A;  . DILATION AND CURETTAGE OF UTERUS    . THYROIDECTOMY    . TONSILLECTOMY     Current Outpatient Medications on File Prior to Visit  Medication Sig Dispense Refill  . aspirin 81 MG tablet Take 81 mg by mouth daily.      . Fluticasone-Umeclidin-Vilant (TRELEGY ELLIPTA) 100-62.5-25 MCG/INH AEPB Inhale into the lungs.    Marland Kitchen HYDROcodone-homatropine (HYCODAN) 5-1.5 MG/5ML syrup Take 5 mLs by mouth every 8 (eight) hours as needed for cough. 120 mL 0  . levothyroxine (SYNTHROID, LEVOTHROID) 50 MCG tablet TAKE 1 TABLET (50 MCG TOTAL) BY MOUTH DAILY BEFORE BREAKFAST. 90 tablet 3  . Polyethyl Glycol-Propyl Glycol (SYSTANE) 0.4-0.3 % SOLN Place 1 drop into both  eyes daily.     . simvastatin (ZOCOR) 40 MG tablet Take 1 tablet (40 mg total) by mouth at bedtime. 90 tablet 3  . tiZANidine (ZANAFLEX) 4 MG tablet TAKE 1 TABLET (4 MG TOTAL) BY MOUTH EVERY 6 (SIX) HOURS AS NEEDED FOR MUSCLE SPASMS. 30 tablet 0  . verapamil (CALAN-SR) 120 MG CR tablet Take 1 tablet (120 mg total) by mouth daily. 30 tablet 10   No current facility-administered medications on file prior to visit.    Allergies  Allergen Reactions  . Meloxicam Other (See Comments)    BAD DREAMS, SAD THOUGHTS   Social History   Socioeconomic History  . Marital status: Widowed    Spouse name: Not on file  . Number of children: 0  . Years of education: Not on file  . Highest education level: Not on file  Social Needs  . Financial resource strain: Not on file  . Food insecurity - worry: Not on file  . Food insecurity - inability: Not on file  . Transportation needs - medical: Not on file  . Transportation needs - non-medical: Not on file  Occupational History    Employer: [employer]  Tobacco Use  . Smoking status: Former Smoker    Last attempt to quit: 07/24/2005    Years since quitting: 11.6  . Smokeless tobacco: Never Used  Substance and Sexual Activity  .  Alcohol use: Yes    Comment: rare  . Drug use: No  . Sexual activity: Not on file  Other Topics Concern  . Not on file  Social History Narrative  . Not on file     Review of Systems  All other systems reviewed and are negative.      Objective:   Physical Exam  Constitutional: She appears well-developed and well-nourished. No distress.  Neck: Neck supple. No thyromegaly present.  Cardiovascular: Normal rate, regular rhythm and normal heart sounds.  Pulmonary/Chest: Effort normal and breath sounds normal. No respiratory distress. She has no wheezes. She has no rales. She exhibits no tenderness.  Abdominal: Soft. Bowel sounds are normal. She exhibits no distension. There is no tenderness. There is no  rebound and no guarding.  Musculoskeletal: She exhibits no edema.  Skin: She is not diaphoretic.  Vitals reviewed.  There is no visual erythema or swelling in any of the involved joints.  There are degenerative changes seen at both knees and in the PCP and MCP joints on both hands.  She has scoliosis.  Therefore I believe the majority of this is likely arthritic       Assessment & Plan:  Polyarthralgia - Plan: Sedimentation rate, CK, CBC with Differential/Platelet, COMPLETE METABOLIC PANEL WITH GFR, ANA  Myalgia - Plan: Sedimentation rate, CK, CBC with Differential/Platelet, COMPLETE METABOLIC PANEL WITH GFR, ANA  I believe this is most likely arthritic.  Begin Naprosyn 500 mg p.o. twice daily as needed pain.  Temporarily try discontinuing Zocor to see if the pain improves off the statin medication.  Check CK to evaluate for evidence of myositis.  Check ANA as well as sed rate to evaluate for rheumatologic conditions.  Consider rheumatoid factor if getting worse.

## 2017-03-19 DIAGNOSIS — H40013 Open angle with borderline findings, low risk, bilateral: Secondary | ICD-10-CM | POA: Diagnosis not present

## 2017-03-20 LAB — CBC WITH DIFFERENTIAL/PLATELET
BASOS ABS: 20 {cells}/uL (ref 0–200)
BASOS PCT: 0.2 %
EOS ABS: 208 {cells}/uL (ref 15–500)
Eosinophils Relative: 2.1 %
HEMATOCRIT: 36.1 % (ref 35.0–45.0)
HEMOGLOBIN: 12.5 g/dL (ref 11.7–15.5)
LYMPHS ABS: 2970 {cells}/uL (ref 850–3900)
MCH: 28.9 pg (ref 27.0–33.0)
MCHC: 34.6 g/dL (ref 32.0–36.0)
MCV: 83.4 fL (ref 80.0–100.0)
MONOS PCT: 7.7 %
MPV: 10.1 fL (ref 7.5–12.5)
NEUTROS ABS: 5940 {cells}/uL (ref 1500–7800)
Neutrophils Relative %: 60 %
Platelets: 402 10*3/uL — ABNORMAL HIGH (ref 140–400)
RBC: 4.33 10*6/uL (ref 3.80–5.10)
RDW: 12.9 % (ref 11.0–15.0)
Total Lymphocyte: 30 %
WBC mixed population: 762 cells/uL (ref 200–950)
WBC: 9.9 10*3/uL (ref 3.8–10.8)

## 2017-03-20 LAB — COMPLETE METABOLIC PANEL WITH GFR
AG Ratio: 1.3 (calc) (ref 1.0–2.5)
ALT: 11 U/L (ref 6–29)
AST: 16 U/L (ref 10–35)
Albumin: 3.9 g/dL (ref 3.6–5.1)
Alkaline phosphatase (APISO): 57 U/L (ref 33–130)
BUN/Creatinine Ratio: 16 (calc) (ref 6–22)
BUN: 15 mg/dL (ref 7–25)
CALCIUM: 9.5 mg/dL (ref 8.6–10.4)
CO2: 29 mmol/L (ref 20–32)
CREATININE: 0.94 mg/dL — AB (ref 0.60–0.93)
Chloride: 104 mmol/L (ref 98–110)
GFR, Est African American: 70 mL/min/{1.73_m2} (ref >=60)
GFR, Est Non African American: 61 mL/min/{1.73_m2} (ref >=60)
GLUCOSE: 104 mg/dL — AB (ref 65–99)
Globulin: 2.9 g/dL (calc) (ref 1.9–3.7)
POTASSIUM: 4.5 mmol/L (ref 3.5–5.3)
Sodium: 140 mmol/L (ref 135–146)
Total Bilirubin: 0.4 mg/dL (ref 0.2–1.2)
Total Protein: 6.8 g/dL (ref 6.1–8.1)

## 2017-03-20 LAB — ANA: ANA: NEGATIVE

## 2017-03-20 LAB — CK: Total CK: 175 U/L — ABNORMAL HIGH (ref 29–143)

## 2017-03-20 LAB — SEDIMENTATION RATE: SED RATE: 9 mm/h (ref 0–30)

## 2017-03-22 DIAGNOSIS — I10 Essential (primary) hypertension: Secondary | ICD-10-CM | POA: Diagnosis not present

## 2017-03-22 DIAGNOSIS — R69 Illness, unspecified: Secondary | ICD-10-CM | POA: Diagnosis not present

## 2017-03-22 DIAGNOSIS — J9611 Chronic respiratory failure with hypoxia: Secondary | ICD-10-CM | POA: Diagnosis not present

## 2017-03-22 DIAGNOSIS — J449 Chronic obstructive pulmonary disease, unspecified: Secondary | ICD-10-CM | POA: Diagnosis not present

## 2017-03-26 DIAGNOSIS — H04129 Dry eye syndrome of unspecified lacrimal gland: Secondary | ICD-10-CM | POA: Diagnosis not present

## 2017-03-26 DIAGNOSIS — H2513 Age-related nuclear cataract, bilateral: Secondary | ICD-10-CM | POA: Diagnosis not present

## 2017-03-26 DIAGNOSIS — H40013 Open angle with borderline findings, low risk, bilateral: Secondary | ICD-10-CM | POA: Diagnosis not present

## 2017-03-28 ENCOUNTER — Telehealth: Payer: Self-pay | Admitting: Family Medicine

## 2017-03-28 NOTE — Telephone Encounter (Signed)
Pt called back and states that her joints are no longer hurting and would like to know what she should do now? Does she need to go back on her cholesterol med or stay on the Naprosyn?   CB# [phone]

## 2017-03-28 NOTE — Telephone Encounter (Signed)
Patient called LMOVM stating that she has been off the med for 2 weeks and would like to know what the next step is - per lab report Dr. Dennard Schaumann wanted her to stop Simvastatin and call us back in 2 weeks to let us know how her pain is doing. LMOVM for pt to call back with that information.

## 2017-03-29 NOTE — Telephone Encounter (Signed)
Patient aware of providers recommendations.  

## 2017-03-29 NOTE — Telephone Encounter (Signed)
Try stopping naprosyn and see if joint pain returns.  Let us know in 2 weeks.

## 2017-04-09 ENCOUNTER — Encounter: Payer: Self-pay | Admitting: Family Medicine

## 2017-04-09 ENCOUNTER — Ambulatory Visit (INDEPENDENT_AMBULATORY_CARE_PROVIDER_SITE_OTHER): Payer: Medicare HMO | Admitting: Family Medicine

## 2017-04-09 VITALS — BP 120/70 | HR 76 | Temp 98.7°F | Resp 18 | Ht 65.5 in | Wt 179.0 lb

## 2017-04-09 DIAGNOSIS — M2550 Pain in unspecified joint: Secondary | ICD-10-CM | POA: Diagnosis not present

## 2017-04-09 NOTE — Progress Notes (Signed)
Subjective:    Patient ID: Mary Blevins, female    DOB: Jan 17, 1946, 72 y.o.   MRN: [id number]  HPI  03/15/17 Patient presents complaining of stiffness in the MCP joints on both hands, the PIP joints on both hands, both knees, and in her lower back.  Stiffness is worse first thing in the morning and improves throughout the day as she is ambulatory.  It also improves if she runs the joints under warm.  She cannot take meloxicam due to sad thoughts when she takes the medicine.  She tried diclofenac and I gave her the exact same reaction.  Curiously however she could take Naprosyn without difficulty.  She has not tried any medication.  She is tried topical Aspercreme.  At that time, my plan was: I believe this is most likely arthritic.  Begin Naprosyn 500 mg p.o. twice daily as needed pain.  Temporarily try discontinuing Zocor to see if the pain improves off the statin medication.  Check CK to evaluate for evidence of myositis.  Check ANA as well as sed rate to evaluate for rheumatologic conditions.  Consider rheumatoid factor if getting worse.  04/09/17 After stopping statin and starting Naprosyn, the patient symptoms improved.  She felt much better.  Therefore I had her temporarily discontinue the Naprosyn to see what role it was playing in her symptomatic improvement.  2 weeks after discontinuing the Naprosyn, the patient is here today for recheck stating that the stiffness and pain have returned in her joints.  Lab work was unremarkable other than a mild elevation in her CK Past Medical History:  Diagnosis Date  . Arthritis   . COPD (chronic obstructive pulmonary disease) ([place])   . Elevated lipids   . Generalized headaches   . History of pneumonia   . Hyperlipidemia   . Hypothyroidism   . MVP (mitral valve prolapse)    palpitations  . Osteopenia   . Seasonal allergies   . Sinusitis   . Tubular adenoma of colon    Past Surgical History:  Procedure Laterality Date  . COLONOSCOPY  WITH PROPOFOL N/A 01/06/2016   Procedure: COLONOSCOPY WITH PROPOFOL;  Surgeon: Doran Stabler, MD;  Location: [facility];  Service: Gastroenterology;  Laterality: N/A;  . DILATION AND CURETTAGE OF UTERUS    . THYROIDECTOMY    . TONSILLECTOMY     Current Outpatient Medications on File Prior to Visit  Medication Sig Dispense Refill  . aspirin 81 MG tablet Take 81 mg by mouth daily.      . Fluticasone-Umeclidin-Vilant (TRELEGY ELLIPTA) 100-62.5-25 MCG/INH AEPB Inhale into the lungs.    Marland Kitchen HYDROcodone-homatropine (HYCODAN) 5-1.5 MG/5ML syrup Take 5 mLs by mouth every 8 (eight) hours as needed for cough. 120 mL 0  . levothyroxine (SYNTHROID, LEVOTHROID) 50 MCG tablet TAKE 1 TABLET (50 MCG TOTAL) BY MOUTH DAILY BEFORE BREAKFAST. 90 tablet 3  . Polyethyl Glycol-Propyl Glycol (SYSTANE) 0.4-0.3 % SOLN Place 1 drop into both eyes daily.     Marland Kitchen tiZANidine (ZANAFLEX) 4 MG tablet TAKE 1 TABLET (4 MG TOTAL) BY MOUTH EVERY 6 (SIX) HOURS AS NEEDED FOR MUSCLE SPASMS. 30 tablet 0  . verapamil (CALAN-SR) 120 MG CR tablet Take 1 tablet (120 mg total) by mouth daily. 30 tablet 10  . naproxen (NAPROSYN) 500 MG tablet Take 1 tablet (500 mg total) by mouth 2 (two) times daily as needed. (Patient not taking: Reported on 04/09/2017) 60 tablet 0  . simvastatin (ZOCOR) 40 MG tablet Take 1 tablet (  40 mg total) by mouth at bedtime. (Patient not taking: Reported on 04/09/2017) 90 tablet 3   No current facility-administered medications on file prior to visit.    Allergies  Allergen Reactions  . Meloxicam Other (See Comments)    BAD DREAMS, SAD THOUGHTS   Social History   Socioeconomic History  . Marital status: Widowed    Spouse name: Not on file  . Number of children: 0  . Years of education: Not on file  . Highest education level: Not on file  Social Needs  . Financial resource strain: Not on file  . Food insecurity - worry: Not on file  . Food insecurity - inability: Not on file  . Transportation needs -  medical: Not on file  . Transportation needs - non-medical: Not on file  Occupational History    Employer: [employer]  Tobacco Use  . Smoking status: Former Smoker    Last attempt to quit: 07/24/2005    Years since quitting: 11.7  . Smokeless tobacco: Never Used  Substance and Sexual Activity  . Alcohol use: Yes    Comment: rare  . Drug use: No  . Sexual activity: Not on file  Other Topics Concern  . Not on file  Social History Narrative  . Not on file     Review of Systems  All other systems reviewed and are negative.      Objective:   Physical Exam  Constitutional: She appears well-developed and well-nourished. No distress.  Neck: Neck supple. No thyromegaly present.  Cardiovascular: Normal rate, regular rhythm and normal heart sounds.  Pulmonary/Chest: Effort normal and breath sounds normal. No respiratory distress. She has no wheezes. She has no rales. She exhibits no tenderness.  Abdominal: Soft. Bowel sounds are normal. She exhibits no distension. There is no tenderness. There is no rebound and no guarding.  Musculoskeletal: She exhibits no edema.  Skin: She is not diaphoretic.  Vitals reviewed.  There is no visual erythema or swelling in any of the involved joints.  There are degenerative changes seen at both knees and in the PCP and MCP joints on both hands.  She has scoliosis.  Therefore I believe the majority of this is likely arthritic       Assessment & Plan:  Polyarthralgia secondary to osteoarthritis and age-related muscle aches.  Despite remaining off the statin, the patient symptoms have returned as soon as she discontinued the Naprosyn.  Therefore I believe the reason her symptoms improved was not due to discontinuation of the statin but was rather due to the institution of an NSAID.  Therefore I have asked the patient to resume her statin.  She can use Naprosyn as needed for muscle aches.  She can also use Tylenol on a regular basis for  muscle aches and joint pains.  We discussed the risk of daily NSAID use including ulcers, renal insufficiency, and increased risk of cardiovascular disease

## 2017-04-10 ENCOUNTER — Telehealth: Payer: Self-pay | Admitting: Family Medicine

## 2017-04-10 NOTE — Telephone Encounter (Signed)
Stop aspirin.

## 2017-04-10 NOTE — Telephone Encounter (Signed)
Patient called [facility] stating that she seen on the news that people over 70 should not take Asa 81mg  daily as it can cause unwanted bleeding and would like to know if she could continue to take this???

## 2017-04-11 NOTE — Telephone Encounter (Signed)
Pt aware via vm 

## 2017-05-07 ENCOUNTER — Encounter: Payer: Self-pay | Admitting: Family Medicine

## 2017-05-17 ENCOUNTER — Other Ambulatory Visit: Payer: Self-pay

## 2017-05-17 MED ORDER — VERAPAMIL HCL ER 120 MG PO TBCR: 120.0000 mg | EXTENDED_RELEASE_TABLET | Freq: Every day | ORAL | 1 refills | Status: DC

## 2017-05-21 ENCOUNTER — Telehealth: Payer: Self-pay | Admitting: Family Medicine

## 2017-05-21 DIAGNOSIS — J069 Acute upper respiratory infection, unspecified: Secondary | ICD-10-CM

## 2017-05-21 NOTE — Telephone Encounter (Signed)
Patient called [facility] asking for a refill on the hycodan as she is having a lot of coughing and it is keeping her up at night. She is using Flonase and thinks its just the pollen and the Tessalon does not help at all. If ok to refill please send to pharm.

## 2017-05-22 ENCOUNTER — Encounter: Payer: Self-pay | Admitting: Family Medicine

## 2017-05-22 ENCOUNTER — Ambulatory Visit (INDEPENDENT_AMBULATORY_CARE_PROVIDER_SITE_OTHER): Payer: Medicare HMO | Admitting: Family Medicine

## 2017-05-22 ENCOUNTER — Other Ambulatory Visit: Payer: Self-pay | Admitting: Family Medicine

## 2017-05-22 ENCOUNTER — Telehealth: Payer: Self-pay | Admitting: Family Medicine

## 2017-05-22 VITALS — BP 130/70 | HR 74 | Temp 98.5°F | Resp 18 | Ht 65.5 in | Wt 176.0 lb

## 2017-05-22 DIAGNOSIS — J441 Chronic obstructive pulmonary disease with (acute) exacerbation: Secondary | ICD-10-CM

## 2017-05-22 MED ORDER — HYDROCODONE-HOMATROPINE 5-1.5 MG/5ML PO SYRP: 5.0000 mL | ORAL_SOLUTION | Freq: Three times a day (TID) | ORAL | 0 refills | Status: DC | PRN

## 2017-05-22 MED ORDER — PREDNISONE 20 MG PO TABS: 40.0000 mg | ORAL_TABLET | Freq: Every day | ORAL | 0 refills | Status: DC

## 2017-05-22 MED ORDER — LEVOCETIRIZINE DIHYDROCHLORIDE 5 MG PO TABS: 5.0000 mg | ORAL_TABLET | Freq: Every evening | ORAL | 1 refills | Status: DC

## 2017-05-22 MED ORDER — AZITHROMYCIN 250 MG PO TABS: ORAL_TABLET | ORAL | 0 refills | Status: DC

## 2017-05-22 NOTE — Telephone Encounter (Signed)
Called and spoke to pt - [facility] and would like for [place] to resend to [address]. Called [facility] and they have both in stock. Please resend to [facility].

## 2017-05-22 NOTE — Progress Notes (Addendum)
Subjective:    Patient ID: Mary Blevins, female    DOB: 1945-07-09, 72 y.o.   MRN: [id number]  HPI Patient is a very pleasant 72 year old African-American female with a history of COPD currently managed with Trelegy once daily.  Approximately 1 week ago, with increased pollen in the air and on the ground and on surfaces, the patient developed rhinorrhea, sneezing, head congestion, postnasal drip, and cough.  With the cough, she developed increasing shortness of breath, occasional wheezing, green sputum production.  The cough has worsened gradually throughout the week and is now moderate to severe.  She denies any severe shortness of breath only with moderate exertion.  She denies any pleurisy or hemoptysis.  She denies any fevers or chills.  She has been using Flonase with slight improvement.  She is not taking any antihistamine. Past Medical History:  Diagnosis Date  . Arthritis   . COPD (chronic obstructive pulmonary disease) ([place])   . Elevated lipids   . Generalized headaches   . History of pneumonia   . Hyperlipidemia   . Hypothyroidism   . MVP (mitral valve prolapse)    palpitations  . Osteopenia   . Seasonal allergies   . Sinusitis   . Tubular adenoma of colon    Past Surgical History:  Procedure Laterality Date  . COLONOSCOPY WITH PROPOFOL N/A 01/06/2016   Procedure: COLONOSCOPY WITH PROPOFOL;  Surgeon: Doran Stabler, MD;  Location: [facility];  Service: Gastroenterology;  Laterality: N/A;  . DILATION AND CURETTAGE OF UTERUS    . THYROIDECTOMY    . TONSILLECTOMY     Current Outpatient Medications on File Prior to Visit  Medication Sig Dispense Refill  . Fluticasone-Umeclidin-Vilant (TRELEGY ELLIPTA) 100-62.5-25 MCG/INH AEPB Inhale into the lungs.    Marland Kitchen levothyroxine (SYNTHROID, LEVOTHROID) 50 MCG tablet TAKE 1 TABLET (50 MCG TOTAL) BY MOUTH DAILY BEFORE BREAKFAST. 90 tablet 3  . naproxen (NAPROSYN) 500 MG tablet Take 1 tablet (500 mg total) by mouth 2 (two)  times daily as needed. 60 tablet 0  . Polyethyl Glycol-Propyl Glycol (SYSTANE) 0.4-0.3 % SOLN Place 1 drop into both eyes daily.     . simvastatin (ZOCOR) 40 MG tablet Take 1 tablet (40 mg total) by mouth at bedtime. 90 tablet 3  . tiZANidine (ZANAFLEX) 4 MG tablet TAKE 1 TABLET (4 MG TOTAL) BY MOUTH EVERY 6 (SIX) HOURS AS NEEDED FOR MUSCLE SPASMS. 30 tablet 0  . verapamil (CALAN-SR) 120 MG CR tablet Take 1 tablet (120 mg total) by mouth daily. 90 tablet 1   No current facility-administered medications on file prior to visit.    Allergies  Allergen Reactions  . Meloxicam Other (See Comments)    BAD DREAMS, SAD THOUGHTS   Social History   Socioeconomic History  . Marital status: Widowed    Spouse name: Not on file  . Number of children: 0  . Years of education: Not on file  . Highest education level: Not on file  Occupational History    Employer: [employer]  Social Needs  . Financial resource strain: Not on file  . Food insecurity:    Worry: Not on file    Inability: Not on file  . Transportation needs:    Medical: Not on file    Non-medical: Not on file  Tobacco Use  . Smoking status: Former Smoker    Last attempt to quit: 07/24/2005    Years since quitting: 11.8  . Smokeless tobacco: Never Used  Substance  and Sexual Activity  . Alcohol use: Yes    Comment: rare  . Drug use: No  . Sexual activity: Not on file  Lifestyle  . Physical activity:    Days per week: Not on file    Minutes per session: Not on file  . Stress: Not on file  Relationships  . Social connections:    Talks on phone: Not on file    Gets together: Not on file    Attends religious service: Not on file    Active member of club or organization: Not on file    Attends meetings of clubs or organizations: Not on file    Relationship status: Not on file  . Intimate partner violence:    Fear of current or ex partner: Not on file    Emotionally abused: Not on file    Physically abused:  Not on file    Forced sexual activity: Not on file  Other Topics Concern  . Not on file  Social History Narrative  . Not on file      Review of Systems  All other systems reviewed and are negative.      Objective:   Physical Exam  Constitutional: She appears well-developed and well-nourished. No distress.  HENT:  Head: Normocephalic and atraumatic.  Right Ear: External ear normal.  Left Ear: External ear normal.  Nose: Mucosal edema and rhinorrhea present.  Mouth/Throat: Oropharynx is clear and moist.  Neck: Normal range of motion.  Cardiovascular: Normal rate, regular rhythm and normal heart sounds. Exam reveals no gallop and no friction rub.  No murmur heard. Pulmonary/Chest: Effort normal. No stridor. No respiratory distress. She has decreased breath sounds. She has wheezes. She has no rales. She exhibits no tenderness.  Lymphadenopathy:    She has no cervical adenopathy.  Skin: She is not diaphoretic.  Vitals reviewed.         Assessment & Plan:  COPD with acute exacerbation [facility])  Patient meets criteria for COPD exacerbation.  She has increased shortness of breath, increased wheezing, increased sputum production, and change in sputum characteristic is now green and thick.  However I believe this is more of an asthma exacerbation triggered by seasonal allergies.  I have recommended prednisone 40 mg a day for 7 days coupled with Xyzal 5 mg p.o. daily.  If symptoms worsen in that the patient develops worsening cough, worsening shortness of breath, increasing sputum production, or fever, I want her to add Z-Pak to the medication however we will start simply with the prednisone and xyzal for now.  Addendum- Patient has COPD with intermittent exacerbations requiring Trelegy.  She also uses albuterol nebs 2.5 mg inhaled every 6 hours as needed for wheezing and bronchospasm.  This requires a nebulizer machine to inhale the nebs every 6 hours as needed.

## 2017-05-22 NOTE — Telephone Encounter (Signed)
Patient is calling to let you know they did not have her cough syrup at the pharmacy that she had requested first  Wants to know if this can be [facility] [place]

## 2017-05-23 ENCOUNTER — Telehealth: Payer: Self-pay | Admitting: Family Medicine

## 2017-05-23 ENCOUNTER — Telehealth: Payer: Self-pay | Admitting: Interventional Cardiology

## 2017-05-23 DIAGNOSIS — E7849 Other hyperlipidemia: Secondary | ICD-10-CM

## 2017-05-23 NOTE — Telephone Encounter (Signed)
Pt called last night, concerned about taking her Verapamil and her zocor She has been on both meds for a while States her pharmacist told her there was an interaction and that they have tried contacting Dr. Tamala Julian her cardiologist ? She has not had any SE, she was planning to just stop the verapamil  Advised her not to stop as this controls her HR, has history of arrythmia and that many people can be on both without SE.  Will send message to PCP and Dr. Marney Setting she is due for lipid panel as well

## 2017-05-23 NOTE — Telephone Encounter (Signed)
Spoke with Mary Blevins, PharmD.  Interaction would be increased myalgias.  Spoke with Mary Blevins, [facility] and made him aware that pt has been on both medications and has not voiced any issue about myalgias.  Chase appreciative for call.  Called pt and provided her with same information.  Pt states that she has been having increased joint and muscle aches but is also diagnosed with arthritis.  Has most pain in lower back, knees and shoulders.  Advised I would send message to Dr. Tamala Julian for review.

## 2017-05-23 NOTE — Telephone Encounter (Signed)
New Message    Pt c/o medication issue:  1. Name of Medication: verapamil (CALAN-SR) 120 MG CR tablet and simvastatin (ZOCOR) 40 MG tablet  2. How are you currently taking this medication (dosage and times per day)?   3. Are you having a reaction (difficulty breathing--STAT)?   4. What is your medication issue? [facility] is calling on behalf of patient. His concern is possible drug interactions between the two medications. Please call to discuss.

## 2017-05-24 NOTE — Telephone Encounter (Signed)
Tell her to switch zocor to pravastatin 40 mg a day.  I think she is fine on the combo but there is a slightly higher incidence of muscle pain on the combo of verapamil and zocor.

## 2017-05-25 MED ORDER — ATORVASTATIN CALCIUM 20 MG PO TABS: 20.0000 mg | ORAL_TABLET | Freq: Every day | ORAL | 3 refills | Status: DC

## 2017-05-25 NOTE — Telephone Encounter (Signed)
Change simvastatin to atorvastatin 20 mg daily.

## 2017-05-25 NOTE — Telephone Encounter (Signed)
Looks like the pharmacy contacted her cardiologists (see phone note) - do you want me to call her and change this medication or deferr to cards. ?

## 2017-05-25 NOTE — Telephone Encounter (Signed)
I would defer to Dr. Thompson Caul opinion, he said to switch her to lipitor 20 poqday.

## 2017-05-25 NOTE — Telephone Encounter (Signed)
Spoke with pt and went over recommendations per Dr. Tamala Julian.  Pt verbalized understanding and was in agreement with this plan.  Set pt up for lab work on 7/8.  Pt appreciative for call.

## 2017-05-28 ENCOUNTER — Telehealth: Payer: Self-pay | Admitting: Interventional Cardiology

## 2017-05-28 NOTE — Telephone Encounter (Signed)
Spoke with patient who states she does not want to start atorvastatin due to the severe side effects her sister had. She understands that she needs to stop simvastatin due to interactions with other medications but asks if she can start rosuvastatin instead of atorvastatin. I advised that I will send to Dr. Tamala Julian for advice and that someone from our office will call her back with his advice. She is aware that we will likely not call her back today. She apologized for the additional work and was very thankful for my call.

## 2017-05-28 NOTE — Telephone Encounter (Signed)
New Message:       Pt c/o medication issue:  1. Name of Medication: atorvastatin (LIPITOR) 20 MG tablet  2. How are you currently taking this medication (dosage and times per day)? Take 1 tablet (20 mg total) by mouth daily.  3. Are you having a reaction (difficulty breathing--STAT)? No  4. What is your medication issue? Pt states she can not take this medication because it causes joint pain. Pt states to please come up with another medication or she would like to continue to take the previous medication she was on

## 2017-05-30 NOTE — Telephone Encounter (Signed)
He has rosuvastatin 10 mg daily.  Recheck lipid panel and liver panel 6 to 8 weeks after change.

## 2017-05-31 MED ORDER — ROSUVASTATIN CALCIUM 10 MG PO TABS: 10.0000 mg | ORAL_TABLET | Freq: Every day | ORAL | 3 refills | Status: DC

## 2017-05-31 NOTE — Telephone Encounter (Signed)
Spoke with pt and went over recommendations per Dr. Tamala Julian.  Advised we will continue with plan for labs in July. Pt verbalized understanding and was in agreement with this plan.

## 2017-06-19 ENCOUNTER — Telehealth: Payer: Self-pay | Admitting: Family Medicine

## 2017-06-19 NOTE — Telephone Encounter (Signed)
Patient called [facility] stating that her niece had a lump in her breast that was stage 0 and lesion on her kidney and the doctor recommended genetic testing and she wanted to know if we could do that here or send her somewhere to have that done???  CB# [phone]

## 2017-06-19 NOTE — Telephone Encounter (Signed)
I believe she is referring to BRCA genetic testing.  I would be glad to see her to discuss but I don't think she needs that specific test.  I can explain more if necessary.   

## 2017-06-19 NOTE — Telephone Encounter (Signed)
Patient aware of providers recommendations.  

## 2017-07-20 DIAGNOSIS — J449 Chronic obstructive pulmonary disease, unspecified: Secondary | ICD-10-CM | POA: Diagnosis not present

## 2017-07-20 DIAGNOSIS — I471 Supraventricular tachycardia: Secondary | ICD-10-CM | POA: Diagnosis not present

## 2017-07-20 DIAGNOSIS — I10 Essential (primary) hypertension: Secondary | ICD-10-CM | POA: Diagnosis not present

## 2017-07-20 DIAGNOSIS — J9611 Chronic respiratory failure with hypoxia: Secondary | ICD-10-CM | POA: Diagnosis not present

## 2017-07-23 DIAGNOSIS — Z1231 Encounter for screening mammogram for malignant neoplasm of breast: Secondary | ICD-10-CM | POA: Diagnosis not present

## 2017-07-23 LAB — HM MAMMOGRAPHY

## 2017-07-25 ENCOUNTER — Encounter: Payer: Self-pay | Admitting: *Deleted

## 2017-07-25 IMAGING — CR DG CHEST 1V PORT
1 series · 1 of 1 positions shown · non-contrast
Comparison: 05/28/2015 chest radiograph.

CLINICAL DATA: Palpitations.  Dyspnea .

EXAM:
PORTABLE CHEST 1 VIEW

[ap portable]
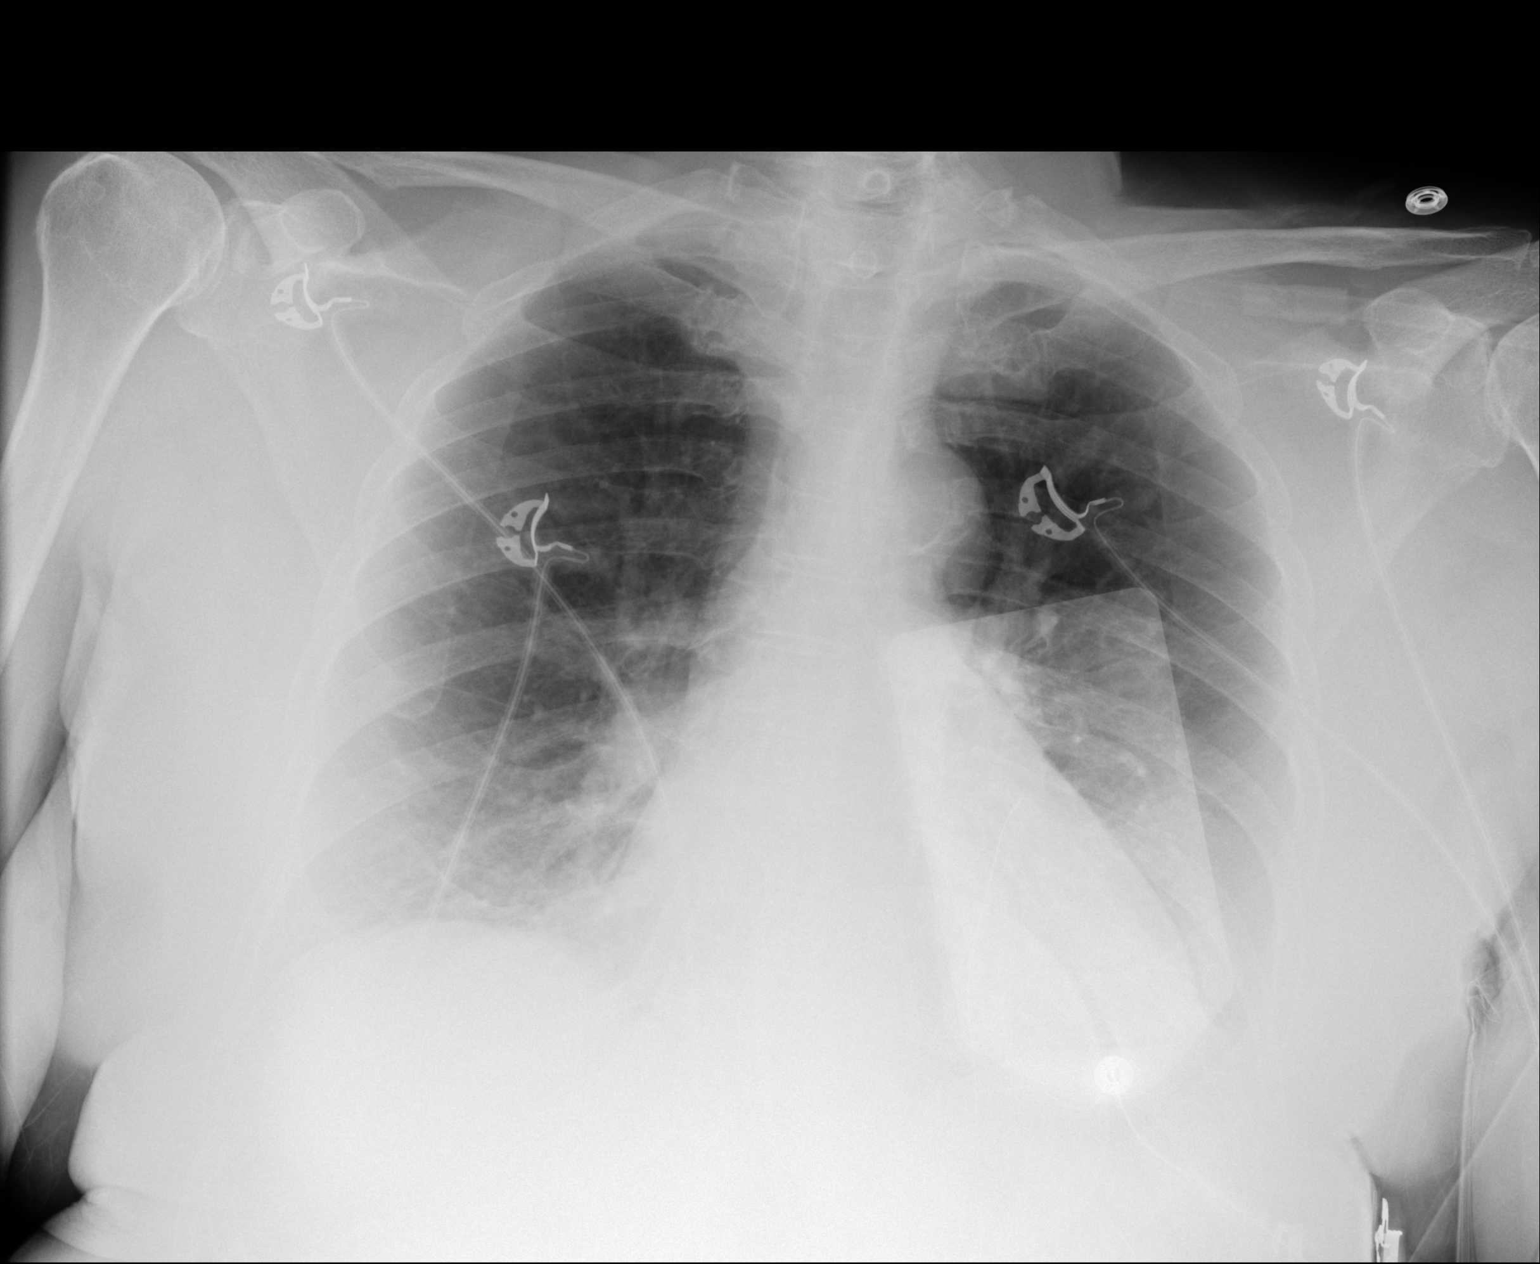

[1 of 1 positions shown; findings below may reference images not displayed]

FINDINGS: Pacer pad obscures the left lower chest. Stable cardiomediastinal
silhouette with mild cardiomegaly and aortic atherosclerosis. No
pneumothorax. No right pleural effusion. Possible small left pleural
effusion. No overt pulmonary edema. No acute consolidative airspace
disease.
IMPRESSION: Mild cardiomegaly without overt pulmonary edema. No acute pulmonary
disease.

Possible small left pleural effusion.

Aortic atherosclerosis.

## 2017-07-30 ENCOUNTER — Other Ambulatory Visit: Payer: Medicare HMO | Admitting: *Deleted

## 2017-07-30 DIAGNOSIS — E7849 Other hyperlipidemia: Secondary | ICD-10-CM | POA: Diagnosis not present

## 2017-07-30 LAB — HEPATIC FUNCTION PANEL
ALT: 12 IU/L (ref 0–32)
AST: 12 IU/L (ref 0–40)
Albumin: 3.8 g/dL (ref 3.5–4.8)
Alkaline Phosphatase: 51 IU/L (ref 39–117)
BILIRUBIN TOTAL: 0.5 mg/dL (ref 0.0–1.2)
BILIRUBIN, DIRECT: 0.15 mg/dL (ref 0.00–0.40)
TOTAL PROTEIN: 6.5 g/dL (ref 6.0–8.5)

## 2017-07-30 LAB — LIPID PANEL
CHOL/HDL RATIO: 2.5 ratio (ref 0.0–4.4)
Cholesterol, Total: 145 mg/dL (ref 100–199)
HDL: 59 mg/dL (ref >39)
LDL Calculated: 42 mg/dL (ref 0–99)
TRIGLYCERIDES: 218 mg/dL — AB (ref 0–149)
VLDL Cholesterol Cal: 44 mg/dL — ABNORMAL HIGH (ref 5–40)

## 2017-08-02 ENCOUNTER — Telehealth: Payer: Self-pay | Admitting: *Deleted

## 2017-08-02 MED ORDER — OMEGA-3 FISH OIL 1000 MG PO CAPS: 1.0000 | ORAL_CAPSULE | Freq: Two times a day (BID) | ORAL | 3 refills | Status: DC

## 2017-08-02 MED ORDER — ROSUVASTATIN CALCIUM 5 MG PO TABS: 5.0000 mg | ORAL_TABLET | Freq: Every day | ORAL | 3 refills | Status: DC

## 2017-08-02 NOTE — Telephone Encounter (Signed)
Spoke with pt and went over lab results and recommendations per Dr. Tamala Julian.  Pt agreeable to try Fish Oil.  Pt verbalized understanding and was appreciative for call.

## 2017-08-02 NOTE — Telephone Encounter (Signed)
-----   Message from Belva Crome, MD sent at 08/01/2017  5:49 PM EDT ----- Let the patient know lipids are at target with LDL 42.  Triglyceride is elevated.  She can decrease rosuvastatin to 5 mg/day.  We may need to consider treating elevated triglycerides with omega-3 [place] 1 g twice daily. A copy will be sent to [name], MD

## 2017-08-03 DIAGNOSIS — Z7951 Long term (current) use of inhaled steroids: Secondary | ICD-10-CM | POA: Diagnosis not present

## 2017-08-03 DIAGNOSIS — G4700 Insomnia, unspecified: Secondary | ICD-10-CM | POA: Diagnosis not present

## 2017-08-03 DIAGNOSIS — I499 Cardiac arrhythmia, unspecified: Secondary | ICD-10-CM | POA: Diagnosis not present

## 2017-08-03 DIAGNOSIS — H04129 Dry eye syndrome of unspecified lacrimal gland: Secondary | ICD-10-CM | POA: Diagnosis not present

## 2017-08-03 DIAGNOSIS — E785 Hyperlipidemia, unspecified: Secondary | ICD-10-CM | POA: Diagnosis not present

## 2017-08-03 DIAGNOSIS — E039 Hypothyroidism, unspecified: Secondary | ICD-10-CM | POA: Diagnosis not present

## 2017-08-03 DIAGNOSIS — R69 Illness, unspecified: Secondary | ICD-10-CM | POA: Diagnosis not present

## 2017-08-03 DIAGNOSIS — J449 Chronic obstructive pulmonary disease, unspecified: Secondary | ICD-10-CM | POA: Diagnosis not present

## 2017-08-03 DIAGNOSIS — G8929 Other chronic pain: Secondary | ICD-10-CM | POA: Diagnosis not present

## 2017-08-03 DIAGNOSIS — J309 Allergic rhinitis, unspecified: Secondary | ICD-10-CM | POA: Diagnosis not present

## 2017-08-16 ENCOUNTER — Encounter: Payer: Self-pay | Admitting: Family Medicine

## 2017-08-16 ENCOUNTER — Ambulatory Visit (INDEPENDENT_AMBULATORY_CARE_PROVIDER_SITE_OTHER): Payer: Medicare HMO | Admitting: Family Medicine

## 2017-08-16 VITALS — BP 122/80 | HR 80 | Temp 98.9°F | Resp 18 | Ht 65.5 in | Wt 168.0 lb

## 2017-08-16 DIAGNOSIS — G8929 Other chronic pain: Secondary | ICD-10-CM

## 2017-08-16 DIAGNOSIS — M419 Scoliosis, unspecified: Secondary | ICD-10-CM | POA: Diagnosis not present

## 2017-08-16 DIAGNOSIS — M545 Low back pain: Secondary | ICD-10-CM

## 2017-08-16 MED ORDER — PREDNISONE 20 MG PO TABS: ORAL_TABLET | ORAL | 0 refills | Status: DC

## 2017-08-16 MED ORDER — TIZANIDINE HCL 4 MG PO TABS: ORAL_TABLET | ORAL | 0 refills | Status: DC

## 2017-08-16 MED ORDER — HYDROCODONE-ACETAMINOPHEN 5-325 MG PO TABS: 1.0000 | ORAL_TABLET | Freq: Four times a day (QID) | ORAL | 0 refills | Status: DC | PRN

## 2017-08-16 NOTE — Progress Notes (Signed)
Subjective:    Patient ID: Mary Blevins, female    DOB: [birth year], 72 y.o.   MRN: [id number]  HPI Patient has multifactorial low back pain.  I have copied a MRI report from 2017 describing her scoliosis and moderate central canal spinal stenosis: L1-2:  No impingement.  Disc bulge noted.  L2-3: Moderate central narrowing of the thecal sac with mild displacement of the left L2 nerve in the lateral extraforaminal space due to disc bulge, short pedicles, facet arthropathy, and left inferior foraminal and lateral extraforaminal disc protrusion.  L3-4: Moderate central narrowing of the thecal sac and mild right subarticular lateral recess stenosis due to short pedicles, disc bulge, and facet arthropathy.  L4-5: Moderate central narrowing of the thecal sac with moderate right foraminal stenosis, moderate right subarticular lateral recess stenosis, and moderate displacement of the right L4 nerve in the lateral extraforaminal space due to intervertebral spurring, disc bulge, right lateral extraforaminal disc protrusion, and facet arthropathy.  L5-S1: Mild central narrowing of the thecal sac with mild bilateral foraminal stenosis as well as mild left and borderline right subarticular lateral recess stenosis due to central disc protrusion, disc bulge, and facet arthropathy.  Patient states that over the last week she has had an acute worsening of her low back pain.  It is located in a linear [place] roughly the level of L5 radiating side to side.  It is a bandlike pain.  She denies any numbness in her bowel or bladder.  She denies any sciatica.  She denies any leg weakness.  Pain is constant severe and made worse by standing and walking. Past Medical History:  Diagnosis Date  . Arthritis   . COPD (chronic obstructive pulmonary disease) ([place])   . Elevated lipids   . Generalized headaches   . History of pneumonia   . Hyperlipidemia   . Hypothyroidism   . MVP (mitral valve  prolapse)    palpitations  . Osteopenia   . Seasonal allergies   . Sinusitis   . Tubular adenoma of colon    Past Surgical History:  Procedure Laterality Date  . COLONOSCOPY WITH PROPOFOL N/A 01/06/2016   Procedure: COLONOSCOPY WITH PROPOFOL;  Surgeon: Doran Stabler, MD;  Location: [facility];  Service: Gastroenterology;  Laterality: N/A;  . DILATION AND CURETTAGE OF UTERUS    . THYROIDECTOMY    . TONSILLECTOMY     Current Outpatient Medications on File Prior to Visit  Medication Sig Dispense Refill  . Fluticasone-Umeclidin-Vilant (TRELEGY ELLIPTA) 100-62.5-25 MCG/INH AEPB Inhale into the lungs.    Marland Kitchen HYDROcodone-homatropine (HYCODAN) 5-1.5 MG/5ML syrup Take 5 mLs by mouth every 8 (eight) hours as needed for cough. 120 mL 0  . levothyroxine (SYNTHROID, LEVOTHROID) 50 MCG tablet TAKE 1 TABLET (50 MCG TOTAL) BY MOUTH DAILY BEFORE BREAKFAST. 90 tablet 3  . naproxen (NAPROSYN) 500 MG tablet Take 1 tablet (500 mg total) by mouth 2 (two) times daily as needed. 60 tablet 0  . omega-3 fish oil (MAXEPA) 1000 MG CAPS capsule Take 1 capsule (1,000 mg total) by mouth 2 (two) times daily. (Patient taking differently: Take 1 capsule by mouth 3 (three) times daily. ) 180 capsule 3  . Polyethyl Glycol-Propyl Glycol (SYSTANE) 0.4-0.3 % SOLN Place 1 drop into both eyes daily.     . rosuvastatin (CRESTOR) 5 MG tablet Take 1 tablet (5 mg total) by mouth daily. 90 tablet 3  . verapamil (CALAN-SR) 120 MG CR tablet Take 1 tablet (120 mg total) by mouth  daily. 90 tablet 1   No current facility-administered medications on file prior to visit.    Allergies  Allergen Reactions  . Meloxicam Other (See Comments)    BAD DREAMS, SAD THOUGHTS   Social History   Socioeconomic History  . Marital status: Widowed    Spouse name: Not on file  . Number of children: 0  . Years of education: Not on file  . Highest education level: Not on file  Occupational History    Employer: [employer]    Social Needs  . Financial resource strain: Not on file  . Food insecurity:    Worry: Not on file    Inability: Not on file  . Transportation needs:    Medical: Not on file    Non-medical: Not on file  Tobacco Use  . Smoking status: Former Smoker    Last attempt to quit: 07/24/2005    Years since quitting: 12.0  . Smokeless tobacco: Never Used  Substance and Sexual Activity  . Alcohol use: Yes    Comment: rare  . Drug use: No  . Sexual activity: Not on file  Lifestyle  . Physical activity:    Days per week: Not on file    Minutes per session: Not on file  . Stress: Not on file  Relationships  . Social connections:    Talks on phone: Not on file    Gets together: Not on file    Attends religious service: Not on file    Active member of club or organization: Not on file    Attends meetings of clubs or organizations: Not on file    Relationship status: Not on file  . Intimate partner violence:    Fear of current or ex partner: Not on file    Emotionally abused: Not on file    Physically abused: Not on file    Forced sexual activity: Not on file  Other Topics Concern  . Not on file  Social History Narrative  . Not on file      Review of Systems  All other systems reviewed and are negative.      Objective:   Physical Exam  Cardiovascular: Normal rate, regular rhythm and normal heart sounds.  Pulmonary/Chest: Effort normal and breath sounds normal.  Musculoskeletal:       Lumbar back: She exhibits decreased range of motion, tenderness, bony tenderness, pain and spasm.       Back:  Vitals reviewed.         Assessment & Plan:  Chronic midline low back pain without sciatica  Scoliosis of lumbar spine, unspecified scoliosis type  I believe her back pain is multifactorial.  It is most likely due to a combination of scoliosis, central canal spinal stenosis, compensatory muscle spasms, and facet arthritis.  I believe the biggest factor is likely spinal stenosis  based on her presentation.  Begin prednisone taper pack with Zanaflex 4 mg every 8 hours as needed.  She can use hydrocodone for breakthrough pain.  Reassess in 1 week

## 2017-09-03 ENCOUNTER — Telehealth: Payer: Self-pay | Admitting: Family Medicine

## 2017-09-03 NOTE — Telephone Encounter (Signed)
Pt called and wanted to know if we could send a prescription for a new nebulizer to [facility]. Ok to send and if ok how often should she being doing a neb?

## 2017-09-04 NOTE — Telephone Encounter (Signed)
Ok to send, she can do albuterol 2.5 mg neb every 6 hours AS NEEDED for wheezing.

## 2017-09-06 NOTE — Telephone Encounter (Signed)
Faxed order to [place] on 09/06/17 for Nebulizer and supplies

## 2017-09-07 NOTE — Telephone Encounter (Signed)
Can you add an addendum to the ov note on 05/22/17 that she uses a nebulizer and how often she needs it and for what reason she needs it otherwise we will have to see her in an ov.

## 2017-09-10 NOTE — Telephone Encounter (Signed)
In your note on 05/22/17 that you previously did an addendum on can you go back in and add that ( it is to be used with a nebulizer machine.) It is for insurance purposes. Thanks

## 2017-09-10 NOTE — Telephone Encounter (Signed)
Paperwork and notes faxed to [facility]

## 2017-09-10 NOTE — Telephone Encounter (Signed)
Note has been addended in the assessment and plan section

## 2017-09-11 NOTE — Telephone Encounter (Signed)
Note is addended.

## 2017-09-11 NOTE — Telephone Encounter (Signed)
Faxed to [facility] via [employer]

## 2017-09-12 DIAGNOSIS — J45909 Unspecified asthma, uncomplicated: Secondary | ICD-10-CM | POA: Diagnosis not present

## 2017-09-12 DIAGNOSIS — J449 Chronic obstructive pulmonary disease, unspecified: Secondary | ICD-10-CM | POA: Diagnosis not present

## 2017-09-19 DIAGNOSIS — J449 Chronic obstructive pulmonary disease, unspecified: Secondary | ICD-10-CM | POA: Diagnosis not present

## 2017-09-19 DIAGNOSIS — I10 Essential (primary) hypertension: Secondary | ICD-10-CM | POA: Diagnosis not present

## 2017-09-19 DIAGNOSIS — I471 Supraventricular tachycardia: Secondary | ICD-10-CM | POA: Diagnosis not present

## 2017-09-19 DIAGNOSIS — J9611 Chronic respiratory failure with hypoxia: Secondary | ICD-10-CM | POA: Diagnosis not present

## 2017-10-01 DIAGNOSIS — H40013 Open angle with borderline findings, low risk, bilateral: Secondary | ICD-10-CM | POA: Diagnosis not present

## 2017-10-01 DIAGNOSIS — H04123 Dry eye syndrome of bilateral lacrimal glands: Secondary | ICD-10-CM | POA: Diagnosis not present

## 2017-10-01 DIAGNOSIS — H2513 Age-related nuclear cataract, bilateral: Secondary | ICD-10-CM | POA: Diagnosis not present

## 2017-10-05 ENCOUNTER — Telehealth: Payer: Self-pay | Admitting: Family Medicine

## 2017-10-05 NOTE — Telephone Encounter (Signed)
Patient asking for samples of simbicort, however I do not see this on her med list, can you look behind me and let her know if she can get samples  [phone]

## 2017-10-05 NOTE — Telephone Encounter (Signed)
Symbicort - pt given samples and aware to pick them up

## 2017-10-12 DIAGNOSIS — R69 Illness, unspecified: Secondary | ICD-10-CM | POA: Diagnosis not present

## 2017-10-13 DIAGNOSIS — J449 Chronic obstructive pulmonary disease, unspecified: Secondary | ICD-10-CM | POA: Diagnosis not present

## 2017-10-13 DIAGNOSIS — J45909 Unspecified asthma, uncomplicated: Secondary | ICD-10-CM | POA: Diagnosis not present

## 2017-10-23 ENCOUNTER — Encounter: Payer: Self-pay | Admitting: Family Medicine

## 2017-10-23 ENCOUNTER — Ambulatory Visit (INDEPENDENT_AMBULATORY_CARE_PROVIDER_SITE_OTHER): Payer: Medicare HMO | Admitting: Family Medicine

## 2017-10-23 VITALS — BP 122/80 | HR 78 | Temp 98.8°F | Resp 18 | Ht 65.5 in | Wt 161.0 lb

## 2017-10-23 DIAGNOSIS — E781 Pure hyperglyceridemia: Secondary | ICD-10-CM | POA: Diagnosis not present

## 2017-10-23 NOTE — Progress Notes (Signed)
Subjective:    Patient ID: Mary Blevins, female    DOB: 1945/10/11, 72 y.o.   MRN: [id number]  HPI Patient is here today requesting that I "check her lungs".  She denies any new shortness of breath.  She denies any chest pain.  She denies any pleurisy.  She has a history of COPD and is currently on Symbicort and Spiriva.  She is seeing pulmonology.  She denies any increased cough, she denies any sputum production, she denies any fevers or chills.  She denies any increased wheezing.  Unfortunately, her mother recently passed away on 10/22/2022.  I believe that this is affecting the patient and likely causing her more anxiety which could possibly be making her feel more short of breath.  She is also requesting that we recheck her cholesterol.  This was checked in July.  LDL cholesterol and HDL cholesterol were very good.  However her triglycerides were mildly elevated at greater than 200.  Her cardiologist recommended Rosebud Poles which she is currently taking. Past Medical History:  Diagnosis Date  . Arthritis   . COPD (chronic obstructive pulmonary disease) ([place])   . Elevated lipids   . Generalized headaches   . History of pneumonia   . Hyperlipidemia   . Hypothyroidism   . MVP (mitral valve prolapse)    palpitations  . Osteopenia   . Seasonal allergies   . Sinusitis   . Tubular adenoma of colon    Past Surgical History:  Procedure Laterality Date  . COLONOSCOPY WITH PROPOFOL N/A 01/06/2016   Procedure: COLONOSCOPY WITH PROPOFOL;  Surgeon: Doran Stabler, MD;  Location: [facility];  Service: Gastroenterology;  Laterality: N/A;  . DILATION AND CURETTAGE OF UTERUS    . THYROIDECTOMY    . TONSILLECTOMY     Current Outpatient Medications on File Prior to Visit  Medication Sig Dispense Refill  . budesonide-formoterol (SYMBICORT) 160-4.5 MCG/ACT inhaler Inhale 2 puffs into the lungs daily.     . Fluticasone-Umeclidin-Vilant (TRELEGY ELLIPTA) 100-62.5-25 MCG/INH AEPB Inhale into  the lungs.    Marland Kitchen HYDROcodone-acetaminophen (NORCO) 5-325 MG tablet Take 1 tablet by mouth every 6 (six) hours as needed for moderate pain. 30 tablet 0  . levothyroxine (SYNTHROID, LEVOTHROID) 50 MCG tablet TAKE 1 TABLET (50 MCG TOTAL) BY MOUTH DAILY BEFORE BREAKFAST. 90 tablet 3  . naproxen (NAPROSYN) 500 MG tablet Take 1 tablet (500 mg total) by mouth 2 (two) times daily as needed. 60 tablet 0  . omega-3 fish oil (MAXEPA) 1000 MG CAPS capsule Take 1 capsule (1,000 mg total) by mouth 2 (two) times daily. (Patient taking differently: Take 1 capsule by mouth 3 (three) times daily. ) 180 capsule 3  . Polyethyl Glycol-Propyl Glycol (SYSTANE) 0.4-0.3 % SOLN Place 1 drop into both eyes daily.     . rosuvastatin (CRESTOR) 5 MG tablet Take 1 tablet (5 mg total) by mouth daily. 90 tablet 3  . Tiotropium Bromide Monohydrate (SPIRIVA RESPIMAT) 2.5 MCG/ACT AERS Inhale into the lungs.    Marland Kitchen tiZANidine (ZANAFLEX) 4 MG tablet TAKE 1 TABLET (4 MG TOTAL) BY MOUTH EVERY 6 (SIX) HOURS AS NEEDED FOR MUSCLE SPASMS. 30 tablet 0  . verapamil (CALAN-SR) 120 MG CR tablet Take 1 tablet (120 mg total) by mouth daily. 90 tablet 1   No current facility-administered medications on file prior to visit.    Allergies  Allergen Reactions  . Meloxicam Other (See Comments)    BAD DREAMS, SAD THOUGHTS   Social History  Socioeconomic History  . Marital status: Widowed    Spouse name: Not on file  . Number of children: 0  . Years of education: Not on file  . Highest education level: Not on file  Occupational History    Employer: [employer]  Social Needs  . Financial resource strain: Not on file  . Food insecurity:    Worry: Not on file    Inability: Not on file  . Transportation needs:    Medical: Not on file    Non-medical: Not on file  Tobacco Use  . Smoking status: Former Smoker    Last attempt to quit: 07/24/2005    Years since quitting: 12.2  . Smokeless tobacco: Never Used  Substance and Sexual  Activity  . Alcohol use: Yes    Comment: rare  . Drug use: No  . Sexual activity: Not on file  Lifestyle  . Physical activity:    Days per week: Not on file    Minutes per session: Not on file  . Stress: Not on file  Relationships  . Social connections:    Talks on phone: Not on file    Gets together: Not on file    Attends religious service: Not on file    Active member of club or organization: Not on file    Attends meetings of clubs or organizations: Not on file    Relationship status: Not on file  . Intimate partner violence:    Fear of current or ex partner: Not on file    Emotionally abused: Not on file    Physically abused: Not on file    Forced sexual activity: Not on file  Other Topics Concern  . Not on file  Social History Narrative  . Not on file    Past Medical History:  Diagnosis Date  . Arthritis   . COPD (chronic obstructive pulmonary disease) ([place])   . Elevated lipids   . Generalized headaches   . History of pneumonia   . Hyperlipidemia   . Hypothyroidism   . MVP (mitral valve prolapse)    palpitations  . Osteopenia   . Seasonal allergies   . Sinusitis   . Tubular adenoma of colon    Past Surgical History:  Procedure Laterality Date  . COLONOSCOPY WITH PROPOFOL N/A 01/06/2016   Procedure: COLONOSCOPY WITH PROPOFOL;  Surgeon: Doran Stabler, MD;  Location: [facility];  Service: Gastroenterology;  Laterality: N/A;  . DILATION AND CURETTAGE OF UTERUS    . THYROIDECTOMY    . TONSILLECTOMY     Current Outpatient Medications on File Prior to Visit  Medication Sig Dispense Refill  . budesonide-formoterol (SYMBICORT) 160-4.5 MCG/ACT inhaler Inhale 2 puffs into the lungs daily.     . Fluticasone-Umeclidin-Vilant (TRELEGY ELLIPTA) 100-62.5-25 MCG/INH AEPB Inhale into the lungs.    Marland Kitchen HYDROcodone-acetaminophen (NORCO) 5-325 MG tablet Take 1 tablet by mouth every 6 (six) hours as needed for moderate pain. 30 tablet 0  . levothyroxine (SYNTHROID,  LEVOTHROID) 50 MCG tablet TAKE 1 TABLET (50 MCG TOTAL) BY MOUTH DAILY BEFORE BREAKFAST. 90 tablet 3  . naproxen (NAPROSYN) 500 MG tablet Take 1 tablet (500 mg total) by mouth 2 (two) times daily as needed. 60 tablet 0  . omega-3 fish oil (MAXEPA) 1000 MG CAPS capsule Take 1 capsule (1,000 mg total) by mouth 2 (two) times daily. (Patient taking differently: Take 1 capsule by mouth 3 (three) times daily. ) 180 capsule 3  . Polyethyl Glycol-Propyl Glycol (  SYSTANE) 0.4-0.3 % SOLN Place 1 drop into both eyes daily.     . rosuvastatin (CRESTOR) 5 MG tablet Take 1 tablet (5 mg total) by mouth daily. 90 tablet 3  . Tiotropium Bromide Monohydrate (SPIRIVA RESPIMAT) 2.5 MCG/ACT AERS Inhale into the lungs.    Marland Kitchen tiZANidine (ZANAFLEX) 4 MG tablet TAKE 1 TABLET (4 MG TOTAL) BY MOUTH EVERY 6 (SIX) HOURS AS NEEDED FOR MUSCLE SPASMS. 30 tablet 0  . verapamil (CALAN-SR) 120 MG CR tablet Take 1 tablet (120 mg total) by mouth daily. 90 tablet 1   No current facility-administered medications on file prior to visit.    Allergies  Allergen Reactions  . Meloxicam Other (See Comments)    BAD DREAMS, SAD THOUGHTS   Social History   Socioeconomic History  . Marital status: Widowed    Spouse name: Not on file  . Number of children: 0  . Years of education: Not on file  . Highest education level: Not on file  Occupational History    Employer: [employer]  Social Needs  . Financial resource strain: Not on file  . Food insecurity:    Worry: Not on file    Inability: Not on file  . Transportation needs:    Medical: Not on file    Non-medical: Not on file  Tobacco Use  . Smoking status: Former Smoker    Last attempt to quit: 07/24/2005    Years since quitting: 12.2  . Smokeless tobacco: Never Used  Substance and Sexual Activity  . Alcohol use: Yes    Comment: rare  . Drug use: No  . Sexual activity: Not on file  Lifestyle  . Physical activity:    Days per week: Not on file    Minutes per  session: Not on file  . Stress: Not on file  Relationships  . Social connections:    Talks on phone: Not on file    Gets together: Not on file    Attends religious service: Not on file    Active member of club or organization: Not on file    Attends meetings of clubs or organizations: Not on file    Relationship status: Not on file  . Intimate partner violence:    Fear of current or ex partner: Not on file    Emotionally abused: Not on file    Physically abused: Not on file    Forced sexual activity: Not on file  Other Topics Concern  . Not on file  Social History Narrative  . Not on file      Review of Systems  All other systems reviewed and are negative.      Objective:   Physical Exam  Constitutional: She appears well-developed and well-nourished. No distress.  Neck: Neck supple. No JVD present.  Cardiovascular: Normal rate, regular rhythm and normal heart sounds.  Pulmonary/Chest: Effort normal and breath sounds normal. No stridor. No respiratory distress. She has no wheezes. She has no rales. She exhibits no tenderness.  Abdominal: Soft. Bowel sounds are normal.  Musculoskeletal: She exhibits no edema.  Skin: She is not diaphoretic.  Vitals reviewed.         Assessment & Plan:  Hypertriglyceridemia - Plan: COMPLETE METABOLIC PANEL WITH GFR, Lipid panel  Patient is exam is normal today.  I see no evidence of a COPD exacerbation.  I have recommended that she return fasting at her earliest convenience for a CMP and a fasting lipid panel to monitor her triglycerides.  Currently I will make no changes in her Symbicort and Spiriva.  I gave her some samples of Spiriva that I had available.  I see no indication for steroids or antibiotics at the current time.  I did discuss the importance of needing to maintain her on maintenance medication to help prevent COPD exacerbations and also prevent or reduce her reliance on rescue medication.

## 2017-11-05 ENCOUNTER — Other Ambulatory Visit: Payer: Self-pay | Admitting: Family Medicine

## 2017-11-05 MED ORDER — HYDROCODONE-ACETAMINOPHEN 5-325 MG PO TABS: 1.0000 | ORAL_TABLET | Freq: Four times a day (QID) | ORAL | 0 refills | Status: DC | PRN

## 2017-11-05 NOTE — Telephone Encounter (Signed)
Patient is requesting a refill on Hydrocodone   LOV: 10/23/17 LRF:  08/16/17

## 2017-11-12 DIAGNOSIS — J45909 Unspecified asthma, uncomplicated: Secondary | ICD-10-CM | POA: Diagnosis not present

## 2017-11-12 DIAGNOSIS — J449 Chronic obstructive pulmonary disease, unspecified: Secondary | ICD-10-CM | POA: Diagnosis not present

## 2017-12-05 ENCOUNTER — Other Ambulatory Visit: Payer: Self-pay | Admitting: *Deleted

## 2017-12-05 MED ORDER — VERAPAMIL HCL ER 120 MG PO TBCR: 120.0000 mg | EXTENDED_RELEASE_TABLET | Freq: Every day | ORAL | 0 refills | Status: DC

## 2017-12-13 DIAGNOSIS — J449 Chronic obstructive pulmonary disease, unspecified: Secondary | ICD-10-CM | POA: Diagnosis not present

## 2017-12-13 DIAGNOSIS — J45909 Unspecified asthma, uncomplicated: Secondary | ICD-10-CM | POA: Diagnosis not present

## 2017-12-17 DIAGNOSIS — J301 Allergic rhinitis due to pollen: Secondary | ICD-10-CM | POA: Diagnosis not present

## 2017-12-17 DIAGNOSIS — I10 Essential (primary) hypertension: Secondary | ICD-10-CM | POA: Diagnosis not present

## 2017-12-17 DIAGNOSIS — J449 Chronic obstructive pulmonary disease, unspecified: Secondary | ICD-10-CM | POA: Diagnosis not present

## 2017-12-17 DIAGNOSIS — J9611 Chronic respiratory failure with hypoxia: Secondary | ICD-10-CM | POA: Diagnosis not present

## 2017-12-17 DIAGNOSIS — R69 Illness, unspecified: Secondary | ICD-10-CM | POA: Diagnosis not present

## 2018-01-06 ENCOUNTER — Emergency Department (HOSPITAL_COMMUNITY): Payer: Medicare HMO

## 2018-01-06 ENCOUNTER — Encounter (HOSPITAL_COMMUNITY): Payer: Self-pay | Admitting: Emergency Medicine

## 2018-01-06 ENCOUNTER — Emergency Department (HOSPITAL_COMMUNITY)
Admission: EM | Admit: 2018-01-06 | Discharge: 2018-01-06 | Disposition: A | Discharge status: Home / Self Care | Payer: Medicare HMO | Attending: Emergency Medicine | Admitting: Emergency Medicine

## 2018-01-06 DIAGNOSIS — J449 Chronic obstructive pulmonary disease, unspecified: Secondary | ICD-10-CM | POA: Diagnosis not present

## 2018-01-06 DIAGNOSIS — J40 Bronchitis, not specified as acute or chronic: Secondary | ICD-10-CM | POA: Insufficient documentation

## 2018-01-06 DIAGNOSIS — Z87891 Personal history of nicotine dependence: Secondary | ICD-10-CM | POA: Insufficient documentation

## 2018-01-06 DIAGNOSIS — E039 Hypothyroidism, unspecified: Secondary | ICD-10-CM | POA: Insufficient documentation

## 2018-01-06 DIAGNOSIS — Z79899 Other long term (current) drug therapy: Secondary | ICD-10-CM | POA: Diagnosis not present

## 2018-01-06 DIAGNOSIS — R05 Cough: Secondary | ICD-10-CM | POA: Diagnosis not present

## 2018-01-06 MED ORDER — PREDNISONE 50 MG PO TABS
60.0000 mg | ORAL_TABLET | Freq: Once | ORAL | Status: AC
  Administered 2018-01-06: 60 mg via ORAL
  Filled 2018-01-06: qty 1

## 2018-01-06 MED ORDER — IPRATROPIUM-ALBUTEROL 0.5-2.5 (3) MG/3ML IN SOLN
3.0000 mL | Freq: Once | RESPIRATORY_TRACT | Status: AC
  Administered 2018-01-06: 3 mL via RESPIRATORY_TRACT
  Filled 2018-01-06: qty 3

## 2018-01-06 MED ORDER — PREDNISONE 10 MG PO TABS: 40.0000 mg | ORAL_TABLET | Freq: Every day | ORAL | 0 refills | Status: DC

## 2018-01-06 NOTE — ED Triage Notes (Signed)
Pt states she has a cough, congestion, and runny nose since Friday.  Has chronic bronchitis and her pcp tells her to be seen immediately so it does not get worse.

## 2018-01-06 NOTE — ED Provider Notes (Signed)
[facility] EMERGENCY DEPARTMENT Provider Note   CSN: [id number] Arrival date & time: 01/06/18  0759     History   Chief Complaint Chief Complaint  Patient presents with  . Cough    HPI Mary Blevins is a 72 y.o. female.  Patient with upper respiratory symptoms since Friday cough congestion runny nose.  She has a history of chronic bronchitis.  She has been having significant cough and feeling a little bit short of breath.  Oxygen saturations upon presentation are 97%.  Patient's past medical history is known for COPD.  Upon arrival here patient nontoxic not in any respiratory distress.     Past Medical History:  Diagnosis Date  . Arthritis   . COPD (chronic obstructive pulmonary disease) ([place])   . Elevated lipids   . Generalized headaches   . History of pneumonia   . Hyperlipidemia   . Hypothyroidism   . MVP (mitral valve prolapse)    palpitations  . Osteopenia   . Seasonal allergies   . Sinusitis   . Tubular adenoma of colon     Patient Active Problem List   Diagnosis Date Noted  . GAD (generalized anxiety disorder) 12/20/2016  . Chronic insomnia 12/20/2016  . Aortic atherosclerosis ([place]) 06/09/2016  . Benign neoplasm of ascending colon   . Benign neoplasm of transverse colon   . Benign neoplasm of sigmoid colon   . Diverticulosis of sigmoid colon   . Grade I internal hemorrhoids   . PSVT (paroxysmal supraventricular tachycardia) ([place]) 02/17/2015  . Back pain 07/22/2012  . Acute pain of left hip 07/22/2012  . Stricture and stenosis of esophagus 09/18/2011  . Hx of colonic polyps 09/18/2011  . COPD (chronic obstructive pulmonary disease) ([place]) 07/14/2010  . Generalized headaches 07/14/2010  . Osteopenia 07/14/2010  . Hypothyroid 07/14/2010  . Elevated lipids 07/14/2010    Past Surgical History:  Procedure Laterality Date  . COLONOSCOPY WITH PROPOFOL N/A 01/06/2016   Procedure: COLONOSCOPY WITH PROPOFOL;  Surgeon: Doran Stabler, MD;   Location: [facility];  Service: Gastroenterology;  Laterality: N/A;  . DILATION AND CURETTAGE OF UTERUS    . THYROIDECTOMY    . TONSILLECTOMY       OB History   No obstetric history on file.      Home Medications    Prior to Admission medications   Medication Sig Start Date End Date Taking? Authorizing Provider  Fluticasone-Umeclidin-Vilant (TRELEGY ELLIPTA) 100-62.5-25 MCG/INH AEPB Inhale into the lungs.   Yes [provider]  HYDROcodone-acetaminophen (NORCO) 5-325 MG tablet Take 1 tablet by mouth every 6 (six) hours as needed for moderate pain. 11/05/17  Yes [name], MD  levothyroxine (SYNTHROID, LEVOTHROID) 50 MCG tablet TAKE 1 TABLET (50 MCG TOTAL) BY MOUTH DAILY BEFORE BREAKFAST. 03/13/17  Yes [name], MD  naproxen (NAPROSYN) 500 MG tablet Take 1 tablet (500 mg total) by mouth 2 (two) times daily as needed. 03/15/17  Yes [name], MD  omega-3 fish oil (MAXEPA) 1000 MG CAPS capsule Take 1 capsule (1,000 mg total) by mouth 2 (two) times daily. Patient taking differently: Take 1 capsule by mouth 3 (three) times daily.  08/02/17  Yes Belva Crome, MD  Polyethyl Glycol-Propyl Glycol (SYSTANE) 0.4-0.3 % SOLN Place 1 drop into both eyes daily.    Yes [provider]  rosuvastatin (CRESTOR) 5 MG tablet Take 1 tablet (5 mg total) by mouth daily. 08/02/17 07/28/18 Yes Belva Crome, MD  Tiotropium Bromide Monohydrate (  SPIRIVA RESPIMAT) 2.5 MCG/ACT AERS Inhale into the lungs.   Yes [provider]  tiZANidine (ZANAFLEX) 4 MG tablet TAKE 1 TABLET (4 MG TOTAL) BY MOUTH EVERY 6 (SIX) HOURS AS NEEDED FOR MUSCLE SPASMS. 08/16/17  Yes [name], MD  verapamil (CALAN-SR) 120 MG CR tablet Take 1 tablet (120 mg total) by mouth daily. 12/05/17  Yes Belva Crome, MD    Family History Family History  Problem Relation Age of Onset  . Breast cancer Maternal Aunt   . Heart attack Maternal Grandmother   . Heart attack Mother   . Heart  disease Mother   . Dementia Mother   . Diabetes Brother   . Prostate cancer Maternal Grandfather   . Healthy Father   . Healthy Sister   . Heart attack Other        NIECE  . Colon cancer Neg Hx   . Esophageal cancer Neg Hx   . Rectal cancer Neg Hx   . Stomach cancer Neg Hx     Social History Social History   Tobacco Use  . Smoking status: Former Smoker    Last attempt to quit: 07/24/2005    Years since quitting: 12.4  . Smokeless tobacco: Never Used  Substance Use Topics  . Alcohol use: Yes    Comment: rare  . Drug use: No     Allergies   Meloxicam   Review of Systems Review of Systems  Constitutional: Negative for fever.  HENT: Positive for congestion.   Eyes: Negative for redness.  Respiratory: Positive for cough and shortness of breath. Negative for wheezing.   Cardiovascular: Negative for chest pain.  Gastrointestinal: Negative for abdominal pain.  Genitourinary: Negative for dysuria.  Musculoskeletal: Negative for myalgias.  Skin: Negative for rash.  Neurological: Negative for syncope and headaches.  Hematological: Does not bruise/bleed easily.  Psychiatric/Behavioral: Negative for confusion.     Physical Exam Updated Vital Signs BP (!) 133/92   Pulse 96   Temp 98.4 F (36.9 C) (Oral)   Resp 18   Ht 1.651 m (5\' 5" )   Wt 70.8 kg   SpO2 97%   BMI 25.96 kg/m   Physical Exam Vitals signs and nursing note reviewed.  Constitutional:      General: She is not in acute distress.    Appearance: Normal appearance. She is not ill-appearing or toxic-appearing.  HENT:     Head: Normocephalic and atraumatic.     Mouth/Throat:     Mouth: Mucous membranes are moist.  Eyes:     Extraocular Movements: Extraocular movements intact.     Conjunctiva/sclera: Conjunctivae normal.     Pupils: Pupils are equal, round, and reactive to light.  Neck:     Musculoskeletal: Neck supple.  Cardiovascular:     Rate and Rhythm: Normal rate.     Pulses: Normal pulses.      Heart sounds: Normal heart sounds.  Pulmonary:     Effort: Pulmonary effort is normal. No respiratory distress.     Breath sounds: Wheezing present.     Comments: Patient just with some faint wheezing on the left side. Abdominal:     General: Bowel sounds are normal.     Palpations: Abdomen is soft.     Tenderness: There is no abdominal tenderness.  Musculoskeletal: Normal range of motion.  Skin:    General: Skin is warm.     Capillary Refill: Capillary refill takes less than 2 seconds.     Findings: No rash.  Neurological:     General: No focal deficit present.     Mental Status: She is alert and oriented to person, place, and time.      ED Treatments / Results  Labs (all labs ordered are listed, but only abnormal results are displayed) Labs Reviewed - No data to display  EKG None  Radiology Dg Chest 2 View  Result Date: 01/06/2018 CLINICAL DATA:  Cough and congestion. EXAM: CHEST - 2 VIEW COMPARISON:  April 25, 2016 FINDINGS: The heart, hila, mediastinum, and pleura are normal. No focal infiltrate. Minimal atelectasis in the lateral right lung base. No other acute abnormalities. IMPRESSION: No focal infiltrate to suggest pneumonia. Mild atelectasis in the lateral right lung base. Electronically Signed   By: Dorise Bullion III M.D   On: 01/06/2018 10:29    Procedures Procedures (including critical care time)  Medications Ordered in ED Medications  ipratropium-albuterol (DUONEB) 0.5-2.5 (3) MG/3ML nebulizer solution 3 mL (3 mLs Nebulization Given 01/06/18 1028)  predniSONE (DELTASONE) tablet 60 mg (60 mg Oral Given 01/06/18 1406)     Initial Impression / Assessment and Plan / ED Course  I have reviewed the triage vital signs and the nursing notes.  Pertinent labs & imaging results that were available during my care of the patient were reviewed by me and considered in my medical decision making (see chart for details).    Patient's symptoms and presentation  suggestive of bronchitis.  Chest x-ray negative for pneumonia.  Was the main reason for checking it.  Patient states she is had cough congestion and runny nose since Friday.  She has chronic bronchitis and her primary care provider recommended that she get seen if she got worse.  Patient received nebulizer treatment here with feeling better.  Patient did have some slight wheezing on presentation.  Patient also received 60 mg of prednisone at home.  She will continue at home with 5-day course of the prednisone and she will follow-up with her primary care doctor.  She will return for new or worse symptoms.  Opted not to give patient albuterol inhaler since I think this is mostly bronchitis there was some faint wheezing but it resolved completely with the nebulizer treatment I think prednisone will serve her best.   Final Clinical Impressions(s) / ED Diagnoses   Final diagnoses:  Bronchitis    ED Discharge Orders    None       [name], MD 01/09/18 1129

## 2018-01-06 NOTE — Discharge Instructions (Addendum)
Take the prednisone as directed for the next 5 days.  Use your rescue inhaler 2 puffs every 6 hours.  Today's chest x-ray showed no evidence of pneumonia.  Return for any new or worse symptoms.

## 2018-01-08 ENCOUNTER — Telehealth: Payer: Self-pay | Admitting: Family Medicine

## 2018-01-08 MED ORDER — HYDROCODONE-HOMATROPINE 5-1.5 MG/5ML PO SYRP: 5.0000 mL | ORAL_SOLUTION | Freq: Three times a day (TID) | ORAL | 0 refills | Status: DC | PRN

## 2018-01-08 NOTE — Telephone Encounter (Signed)
Pt was seen at hospital in ER for bronchitis and is still coughing really bad would like to know if we can call in hydrocodone cough syrup for her to [facility].

## 2018-01-08 NOTE — Telephone Encounter (Signed)
[facility]

## 2018-01-08 NOTE — Telephone Encounter (Signed)
Pt aware via vm 

## 2018-01-12 DIAGNOSIS — J45909 Unspecified asthma, uncomplicated: Secondary | ICD-10-CM | POA: Diagnosis not present

## 2018-01-12 DIAGNOSIS — J449 Chronic obstructive pulmonary disease, unspecified: Secondary | ICD-10-CM | POA: Diagnosis not present

## 2018-01-18 ENCOUNTER — Telehealth: Payer: Self-pay | Admitting: *Deleted

## 2018-01-18 NOTE — Telephone Encounter (Signed)
Call placed to patient and patient made aware per MyChart.

## 2018-01-18 NOTE — Telephone Encounter (Signed)
Received call from patient.   Reports that she required refill on Symbicort.   I do not see Symbicort on medication list.   MD please advise.

## 2018-01-18 NOTE — Telephone Encounter (Signed)
She was supposed to be on trelegy started by her pulmonologist.  Would not need both.

## 2018-01-24 ENCOUNTER — Ambulatory Visit (INDEPENDENT_AMBULATORY_CARE_PROVIDER_SITE_OTHER): Payer: Medicare HMO | Admitting: Family Medicine

## 2018-01-24 ENCOUNTER — Encounter: Payer: Self-pay | Admitting: Family Medicine

## 2018-01-24 VITALS — BP 100/60 | HR 80 | Temp 98.5°F | Resp 20 | Ht 65.5 in | Wt 154.0 lb

## 2018-01-24 DIAGNOSIS — J441 Chronic obstructive pulmonary disease with (acute) exacerbation: Secondary | ICD-10-CM

## 2018-01-24 MED ORDER — PREDNISONE 20 MG PO TABS: 40.0000 mg | ORAL_TABLET | Freq: Every day | ORAL | 0 refills | Status: DC

## 2018-01-24 MED ORDER — METHYLPREDNISOLONE ACETATE 40 MG/ML IJ SUSP
60.0000 mg | Freq: Once | INTRAMUSCULAR | Status: AC
  Administered 2018-01-24: 60 mg via INTRAMUSCULAR

## 2018-01-24 MED ORDER — AZITHROMYCIN 250 MG PO TABS: ORAL_TABLET | ORAL | 0 refills | Status: DC

## 2018-01-24 MED ORDER — BUDESONIDE-FORMOTEROL FUMARATE 160-4.5 MCG/ACT IN AERO: 2.0000 | INHALATION_SPRAY | Freq: Two times a day (BID) | RESPIRATORY_TRACT | 3 refills | Status: DC

## 2018-01-24 NOTE — Progress Notes (Signed)
Subjective:    Patient ID: Mary Blevins, female    DOB: 1946-01-07, 73 y.o.   MRN: [id number]  HPI Patient was recently seen in the emergency room on December 15 and was diagnosed with bronchitis.  Patient has a history of COPD.  Previously this was managed with Trelegy.  Her pulmonologist switched her from Trelegy to the combination of Symbicort and Spiriva.  However the patient discontinue Symbicort due to cost and is only been taking the Spiriva.  Recently she has developed an upper respiratory infection that has persisted now for more than 2 weeks.  She is having copious amounts of chest congestion.  She has a cough in the morning productive of yellow and green sputum.  This is a difference from her baseline.  Normally she does not have a productive cough and certainly without color.  She has increasing shortness of breath.  She has diminished breath sounds bilaterally with faint expiratory wheezing.  She is only been using her rescue inhaler once a day. Past Medical History:  Diagnosis Date  . Arthritis   . COPD (chronic obstructive pulmonary disease) ([place])   . Elevated lipids   . Generalized headaches   . History of pneumonia   . Hyperlipidemia   . Hypothyroidism   . MVP (mitral valve prolapse)    palpitations  . Osteopenia   . Seasonal allergies   . Sinusitis   . Tubular adenoma of colon    Past Surgical History:  Procedure Laterality Date  . COLONOSCOPY WITH PROPOFOL N/A 01/06/2016   Procedure: COLONOSCOPY WITH PROPOFOL;  Surgeon: Doran Stabler, MD;  Location: [facility];  Service: Gastroenterology;  Laterality: N/A;  . DILATION AND CURETTAGE OF UTERUS    . THYROIDECTOMY    . TONSILLECTOMY     Current Outpatient Medications on File Prior to Visit  Medication Sig Dispense Refill  . fluticasone (FLONASE) 50 MCG/ACT nasal spray Place 2 sprays into both nostrils daily.    Marland Kitchen HYDROcodone-acetaminophen (NORCO) 5-325 MG tablet Take 1 tablet by mouth every 6 (six) hours  as needed for moderate pain. 30 tablet 0  . HYDROcodone-homatropine (HYCODAN) 5-1.5 MG/5ML syrup Take 5 mLs by mouth every 8 (eight) hours as needed for cough. 120 mL 0  . levothyroxine (SYNTHROID, LEVOTHROID) 50 MCG tablet TAKE 1 TABLET (50 MCG TOTAL) BY MOUTH DAILY BEFORE BREAKFAST. 90 tablet 3  . omega-3 fish oil (MAXEPA) 1000 MG CAPS capsule Take 1 capsule (1,000 mg total) by mouth 2 (two) times daily. (Patient taking differently: Take 1 capsule by mouth 3 (three) times daily. ) 180 capsule 3  . Polyethyl Glycol-Propyl Glycol (SYSTANE) 0.4-0.3 % SOLN Place 1 drop into both eyes daily.     . rosuvastatin (CRESTOR) 5 MG tablet Take 1 tablet (5 mg total) by mouth daily. 90 tablet 3  . Tiotropium Bromide Monohydrate (SPIRIVA RESPIMAT) 2.5 MCG/ACT AERS Inhale into the lungs.    Marland Kitchen tiZANidine (ZANAFLEX) 4 MG tablet TAKE 1 TABLET (4 MG TOTAL) BY MOUTH EVERY 6 (SIX) HOURS AS NEEDED FOR MUSCLE SPASMS. 30 tablet 0  . verapamil (CALAN-SR) 120 MG CR tablet Take 1 tablet (120 mg total) by mouth daily. 90 tablet 0   No current facility-administered medications on file prior to visit.    Allergies  Allergen Reactions  . Meloxicam Other (See Comments)    BAD DREAMS, SAD THOUGHTS   Social History   Socioeconomic History  . Marital status: Widowed    Spouse name: Not on file  .  Number of children: 0  . Years of education: Not on file  . Highest education level: Not on file  Occupational History    Employer: [employer]  Social Needs  . Financial resource strain: Not on file  . Food insecurity:    Worry: Not on file    Inability: Not on file  . Transportation needs:    Medical: Not on file    Non-medical: Not on file  Tobacco Use  . Smoking status: Former Smoker    Last attempt to quit: 07/24/2005    Years since quitting: 12.5  . Smokeless tobacco: Never Used  Substance and Sexual Activity  . Alcohol use: Yes    Comment: rare  . Drug use: No  . Sexual activity: Not on file    Lifestyle  . Physical activity:    Days per week: Not on file    Minutes per session: Not on file  . Stress: Not on file  Relationships  . Social connections:    Talks on phone: Not on file    Gets together: Not on file    Attends religious service: Not on file    Active member of club or organization: Not on file    Attends meetings of clubs or organizations: Not on file    Relationship status: Not on file  . Intimate partner violence:    Fear of current or ex partner: Not on file    Emotionally abused: Not on file    Physically abused: Not on file    Forced sexual activity: Not on file  Other Topics Concern  . Not on file  Social History Narrative  . Not on file    Past Medical History:  Diagnosis Date  . Arthritis   . COPD (chronic obstructive pulmonary disease) ([place])   . Elevated lipids   . Generalized headaches   . History of pneumonia   . Hyperlipidemia   . Hypothyroidism   . MVP (mitral valve prolapse)    palpitations  . Osteopenia   . Seasonal allergies   . Sinusitis   . Tubular adenoma of colon    Past Surgical History:  Procedure Laterality Date  . COLONOSCOPY WITH PROPOFOL N/A 01/06/2016   Procedure: COLONOSCOPY WITH PROPOFOL;  Surgeon: Doran Stabler, MD;  Location: [facility];  Service: Gastroenterology;  Laterality: N/A;  . DILATION AND CURETTAGE OF UTERUS    . THYROIDECTOMY    . TONSILLECTOMY     Current Outpatient Medications on File Prior to Visit  Medication Sig Dispense Refill  . fluticasone (FLONASE) 50 MCG/ACT nasal spray Place 2 sprays into both nostrils daily.    Marland Kitchen HYDROcodone-acetaminophen (NORCO) 5-325 MG tablet Take 1 tablet by mouth every 6 (six) hours as needed for moderate pain. 30 tablet 0  . HYDROcodone-homatropine (HYCODAN) 5-1.5 MG/5ML syrup Take 5 mLs by mouth every 8 (eight) hours as needed for cough. 120 mL 0  . levothyroxine (SYNTHROID, LEVOTHROID) 50 MCG tablet TAKE 1 TABLET (50 MCG TOTAL) BY MOUTH DAILY BEFORE  BREAKFAST. 90 tablet 3  . omega-3 fish oil (MAXEPA) 1000 MG CAPS capsule Take 1 capsule (1,000 mg total) by mouth 2 (two) times daily. (Patient taking differently: Take 1 capsule by mouth 3 (three) times daily. ) 180 capsule 3  . Polyethyl Glycol-Propyl Glycol (SYSTANE) 0.4-0.3 % SOLN Place 1 drop into both eyes daily.     . rosuvastatin (CRESTOR) 5 MG tablet Take 1 tablet (5 mg total) by mouth daily. [place]  tablet 3  . Tiotropium Bromide Monohydrate (SPIRIVA RESPIMAT) 2.5 MCG/ACT AERS Inhale into the lungs.    Marland Kitchen tiZANidine (ZANAFLEX) 4 MG tablet TAKE 1 TABLET (4 MG TOTAL) BY MOUTH EVERY 6 (SIX) HOURS AS NEEDED FOR MUSCLE SPASMS. 30 tablet 0  . verapamil (CALAN-SR) 120 MG CR tablet Take 1 tablet (120 mg total) by mouth daily. 90 tablet 0   No current facility-administered medications on file prior to visit.    Allergies  Allergen Reactions  . Meloxicam Other (See Comments)    BAD DREAMS, SAD THOUGHTS   Social History   Socioeconomic History  . Marital status: Widowed    Spouse name: Not on file  . Number of children: 0  . Years of education: Not on file  . Highest education level: Not on file  Occupational History    Employer: [employer]  Social Needs  . Financial resource strain: Not on file  . Food insecurity:    Worry: Not on file    Inability: Not on file  . Transportation needs:    Medical: Not on file    Non-medical: Not on file  Tobacco Use  . Smoking status: Former Smoker    Last attempt to quit: 07/24/2005    Years since quitting: 12.5  . Smokeless tobacco: Never Used  Substance and Sexual Activity  . Alcohol use: Yes    Comment: rare  . Drug use: No  . Sexual activity: Not on file  Lifestyle  . Physical activity:    Days per week: Not on file    Minutes per session: Not on file  . Stress: Not on file  Relationships  . Social connections:    Talks on phone: Not on file    Gets together: Not on file    Attends religious service: Not on file     Active member of club or organization: Not on file    Attends meetings of clubs or organizations: Not on file    Relationship status: Not on file  . Intimate partner violence:    Fear of current or ex partner: Not on file    Emotionally abused: Not on file    Physically abused: Not on file    Forced sexual activity: Not on file  Other Topics Concern  . Not on file  Social History Narrative  . Not on file      Review of Systems  All other systems reviewed and are negative.      Objective:   Physical Exam Vitals signs reviewed.  Constitutional:      General: She is not in acute distress.    Appearance: She is well-developed. She is not diaphoretic.  Neck:     Musculoskeletal: Neck supple.     Vascular: No JVD.  Cardiovascular:     Rate and Rhythm: Normal rate and regular rhythm.     Heart sounds: Normal heart sounds.  Pulmonary:     Effort: Pulmonary effort is normal. No respiratory distress.     Breath sounds: Decreased air movement present. No stridor. Examination of the right-upper field reveals decreased breath sounds and wheezing. Examination of the left-upper field reveals decreased breath sounds and wheezing. Examination of the right-lower field reveals decreased breath sounds and wheezing. Examination of the left-lower field reveals decreased breath sounds and wheezing. Decreased breath sounds and wheezing present. No rhonchi or rales.  Chest:     Chest wall: No tenderness.  Abdominal:     General: Bowel sounds  are normal.     Palpations: Abdomen is soft.           Assessment & Plan:  COPD with acute exacerbation ([place]) - Plan: predniSONE (DELTASONE) 20 MG tablet, azithromycin (ZITHROMAX) 250 MG tablet   Patient is having a COPD exacerbation.  First I recommended that she resume her maintenance medications immediately.  Continue Spiriva daily but resume Symbicort 160/4.52 puffs inhaled twice daily.  She needs to increase the frequency of her rescue inhaler  until her lungs are better.  Therefore she can use her albuterol every 6 hours as needed.  Give the patient Solu-Medrol 60 mg IM x1 now and begin prednisone 40 mg a day for 7 days.  Add a Z-Pak given the increase quantity and character of the sputum.  Recheck on Monday or sooner if worse.

## 2018-01-24 NOTE — Addendum Note (Signed)
Addended by: Shary Decamp B on: 01/24/2018 09:10 AM   Modules accepted: Orders

## 2018-01-31 ENCOUNTER — Encounter: Payer: Self-pay | Admitting: Family Medicine

## 2018-01-31 ENCOUNTER — Ambulatory Visit (INDEPENDENT_AMBULATORY_CARE_PROVIDER_SITE_OTHER): Payer: Medicare HMO | Admitting: Family Medicine

## 2018-01-31 VITALS — BP 128/84 | HR 70 | Temp 98.1°F | Resp 18 | Ht 65.5 in | Wt 155.0 lb

## 2018-01-31 DIAGNOSIS — G8929 Other chronic pain: Secondary | ICD-10-CM | POA: Diagnosis not present

## 2018-01-31 DIAGNOSIS — J441 Chronic obstructive pulmonary disease with (acute) exacerbation: Secondary | ICD-10-CM | POA: Diagnosis not present

## 2018-01-31 DIAGNOSIS — M545 Low back pain: Secondary | ICD-10-CM | POA: Diagnosis not present

## 2018-01-31 DIAGNOSIS — M48061 Spinal stenosis, lumbar region without neurogenic claudication: Secondary | ICD-10-CM | POA: Diagnosis not present

## 2018-01-31 MED ORDER — TIOTROPIUM BROMIDE MONOHYDRATE 2.5 MCG/ACT IN AERS: 1.0000 | INHALATION_SPRAY | Freq: Every day | RESPIRATORY_TRACT | 4 refills | Status: DC

## 2018-01-31 NOTE — Progress Notes (Signed)
Subjective:    Patient ID: Mary Blevins, female    DOB: 1945-08-18, 73 y.o.   MRN: [id number]  HPI 01/24/18 Patient was recently seen in the emergency room on December 15 and was diagnosed with bronchitis.  Patient has a history of COPD.  Previously this was managed with Trelegy.  Her pulmonologist switched her from Trelegy to the combination of Symbicort and Spiriva.  However the patient discontinue Symbicort due to cost and is only been taking the Spiriva.  Recently she has developed an upper respiratory infection that has persisted now for more than 2 weeks.  She is having copious amounts of chest congestion.  She has a cough in the morning productive of yellow and green sputum.  This is a difference from her baseline.  Normally she does not have a productive cough and certainly without color.  She has increasing shortness of breath.  She has diminished breath sounds bilaterally with faint expiratory wheezing.  She is only been using her rescue inhaler once a day.  At that time, my plan was  01/31/18 Patient's breathing is much better.  Her lungs are clear today on exam.  She is not wheezing.  The congestion and rhonchi have cleared.  She continues have some chest congestion but she has been able to cough it up now.  She is approximately 90% better compared to last time.  She is also consistently taking her Symbicort and Spiriva.  She continues to complain of low back pain.  Her most recent MRI of her back was obtained in 2017.  L1-2:  No impingement.  Disc bulge noted.  L2-3: Moderate central narrowing of the thecal sac with mild displacement of the left L2 nerve in the lateral extraforaminal space due to disc bulge, short pedicles, facet arthropathy, and left inferior foraminal and lateral extraforaminal disc protrusion.  L3-4: Moderate central narrowing of the thecal sac and mild right subarticular lateral recess stenosis due to short pedicles, disc bulge, and facet  arthropathy.  L4-5: Moderate central narrowing of the thecal sac with moderate right foraminal stenosis, moderate right subarticular lateral recess stenosis, and moderate displacement of the right L4 nerve in the lateral extraforaminal space due to intervertebral spurring, disc bulge, right lateral extraforaminal disc protrusion, and facet arthropathy.  L5-S1: Mild central narrowing of the thecal sac with mild bilateral foraminal stenosis as well as mild left and borderline right subarticular lateral recess stenosis due to central disc protrusion, disc bulge, and facet arthropathy.  S1- 2:  No impingement, rudimentary disc.  She had moderate spinal stenosis of the lumbar spine.  Mild displacement of the left L2 nerve at the foramen.  Moderate displacement of the right L4 nerve due to spurring and bulging disc at L4-L5.  At that point we had recommended physical therapy and conservative treatment options.  These have not been beneficial.  The patient continues to complain of lower back pain roughly at the level of L3-L5.  She is not interested in surgery but is interested to see if possible epidural steroid injections may help her pain.  At the present time her pain is primarily located in the center of her back and also occasionally into her posterior left superior gluteus.  Muscle relaxers, physical therapy, and arthritis medication such as ibuprofen have provided her little relief   Past Medical History:  Diagnosis Date  . Arthritis   . COPD (chronic obstructive pulmonary disease) ([place])   . Elevated lipids   . Generalized headaches   .  History of pneumonia   . Hyperlipidemia   . Hypothyroidism   . MVP (mitral valve prolapse)    palpitations  . Osteopenia   . Seasonal allergies   . Sinusitis   . Tubular adenoma of colon    Past Surgical History:  Procedure Laterality Date  . COLONOSCOPY WITH PROPOFOL N/A 01/06/2016   Procedure: COLONOSCOPY WITH PROPOFOL;  Surgeon: Doran Stabler, MD;  Location: [facility];  Service: Gastroenterology;  Laterality: N/A;  . DILATION AND CURETTAGE OF UTERUS    . THYROIDECTOMY    . TONSILLECTOMY     Current Outpatient Medications on File Prior to Visit  Medication Sig Dispense Refill  . budesonide-formoterol (SYMBICORT) 160-4.5 MCG/ACT inhaler Inhale 2 puffs into the lungs 2 (two) times daily. 1 Inhaler 3  . fluticasone (FLONASE) 50 MCG/ACT nasal spray Place 2 sprays into both nostrils daily.    Marland Kitchen HYDROcodone-homatropine (HYCODAN) 5-1.5 MG/5ML syrup Take 5 mLs by mouth every 8 (eight) hours as needed for cough. 120 mL 0  . levothyroxine (SYNTHROID, LEVOTHROID) 50 MCG tablet TAKE 1 TABLET (50 MCG TOTAL) BY MOUTH DAILY BEFORE BREAKFAST. 90 tablet 3  . Polyethyl Glycol-Propyl Glycol (SYSTANE) 0.4-0.3 % SOLN Place 1 drop into both eyes daily.     . rosuvastatin (CRESTOR) 5 MG tablet Take 1 tablet (5 mg total) by mouth daily. 90 tablet 3  . tiZANidine (ZANAFLEX) 4 MG tablet TAKE 1 TABLET (4 MG TOTAL) BY MOUTH EVERY 6 (SIX) HOURS AS NEEDED FOR MUSCLE SPASMS. 30 tablet 0  . verapamil (CALAN-SR) 120 MG CR tablet Take 1 tablet (120 mg total) by mouth daily. 90 tablet 0   No current facility-administered medications on file prior to visit.    Allergies  Allergen Reactions  . Meloxicam Other (See Comments)    BAD DREAMS, SAD THOUGHTS   Social History   Socioeconomic History  . Marital status: Widowed    Spouse name: Not on file  . Number of children: 0  . Years of education: Not on file  . Highest education level: Not on file  Occupational History    Employer: [employer]  Social Needs  . Financial resource strain: Not on file  . Food insecurity:    Worry: Not on file    Inability: Not on file  . Transportation needs:    Medical: Not on file    Non-medical: Not on file  Tobacco Use  . Smoking status: Former Smoker    Last attempt to quit: 07/24/2005    Years since quitting: 12.5  . Smokeless tobacco:  Never Used  Substance and Sexual Activity  . Alcohol use: Yes    Comment: rare  . Drug use: No  . Sexual activity: Not on file  Lifestyle  . Physical activity:    Days per week: Not on file    Minutes per session: Not on file  . Stress: Not on file  Relationships  . Social connections:    Talks on phone: Not on file    Gets together: Not on file    Attends religious service: Not on file    Active member of club or organization: Not on file    Attends meetings of clubs or organizations: Not on file    Relationship status: Not on file  . Intimate partner violence:    Fear of current or ex partner: Not on file    Emotionally abused: Not on file    Physically abused: Not on file  Forced sexual activity: Not on file  Other Topics Concern  . Not on file  Social History Narrative  . Not on file    Past Medical History:  Diagnosis Date  . Arthritis   . COPD (chronic obstructive pulmonary disease) ([place])   . Elevated lipids   . Generalized headaches   . History of pneumonia   . Hyperlipidemia   . Hypothyroidism   . MVP (mitral valve prolapse)    palpitations  . Osteopenia   . Seasonal allergies   . Sinusitis   . Tubular adenoma of colon    Past Surgical History:  Procedure Laterality Date  . COLONOSCOPY WITH PROPOFOL N/A 01/06/2016   Procedure: COLONOSCOPY WITH PROPOFOL;  Surgeon: Doran Stabler, MD;  Location: [facility];  Service: Gastroenterology;  Laterality: N/A;  . DILATION AND CURETTAGE OF UTERUS    . THYROIDECTOMY    . TONSILLECTOMY     Current Outpatient Medications on File Prior to Visit  Medication Sig Dispense Refill  . budesonide-formoterol (SYMBICORT) 160-4.5 MCG/ACT inhaler Inhale 2 puffs into the lungs 2 (two) times daily. 1 Inhaler 3  . fluticasone (FLONASE) 50 MCG/ACT nasal spray Place 2 sprays into both nostrils daily.    Marland Kitchen HYDROcodone-homatropine (HYCODAN) 5-1.5 MG/5ML syrup Take 5 mLs by mouth every 8 (eight) hours as needed for cough. 120  mL 0  . levothyroxine (SYNTHROID, LEVOTHROID) 50 MCG tablet TAKE 1 TABLET (50 MCG TOTAL) BY MOUTH DAILY BEFORE BREAKFAST. 90 tablet 3  . Polyethyl Glycol-Propyl Glycol (SYSTANE) 0.4-0.3 % SOLN Place 1 drop into both eyes daily.     . rosuvastatin (CRESTOR) 5 MG tablet Take 1 tablet (5 mg total) by mouth daily. 90 tablet 3  . tiZANidine (ZANAFLEX) 4 MG tablet TAKE 1 TABLET (4 MG TOTAL) BY MOUTH EVERY 6 (SIX) HOURS AS NEEDED FOR MUSCLE SPASMS. 30 tablet 0  . verapamil (CALAN-SR) 120 MG CR tablet Take 1 tablet (120 mg total) by mouth daily. 90 tablet 0   No current facility-administered medications on file prior to visit.    Allergies  Allergen Reactions  . Meloxicam Other (See Comments)    BAD DREAMS, SAD THOUGHTS   Social History   Socioeconomic History  . Marital status: Widowed    Spouse name: Not on file  . Number of children: 0  . Years of education: Not on file  . Highest education level: Not on file  Occupational History    Employer: [employer]  Social Needs  . Financial resource strain: Not on file  . Food insecurity:    Worry: Not on file    Inability: Not on file  . Transportation needs:    Medical: Not on file    Non-medical: Not on file  Tobacco Use  . Smoking status: Former Smoker    Last attempt to quit: 07/24/2005    Years since quitting: 12.5  . Smokeless tobacco: Never Used  Substance and Sexual Activity  . Alcohol use: Yes    Comment: rare  . Drug use: No  . Sexual activity: Not on file  Lifestyle  . Physical activity:    Days per week: Not on file    Minutes per session: Not on file  . Stress: Not on file  Relationships  . Social connections:    Talks on phone: Not on file    Gets together: Not on file    Attends religious service: Not on file    Active member of club or organization:  Not on file    Attends meetings of clubs or organizations: Not on file    Relationship status: Not on file  . Intimate partner violence:    Fear of  current or ex partner: Not on file    Emotionally abused: Not on file    Physically abused: Not on file    Forced sexual activity: Not on file  Other Topics Concern  . Not on file  Social History Narrative  . Not on file      Review of Systems  All other systems reviewed and are negative.      Objective:   Physical Exam Vitals signs reviewed.  Constitutional:      General: She is not in acute distress.    Appearance: She is well-developed. She is not diaphoretic.  Neck:     Musculoskeletal: Neck supple.     Vascular: No JVD.  Cardiovascular:     Rate and Rhythm: Normal rate and regular rhythm.     Heart sounds: Normal heart sounds.  Pulmonary:     Effort: Pulmonary effort is normal. No respiratory distress.     Breath sounds: No stridor or decreased air movement. No decreased breath sounds, wheezing, rhonchi or rales.  Chest:     Chest wall: No tenderness.  Abdominal:     General: Bowel sounds are normal.     Palpations: Abdomen is soft.  Musculoskeletal:     Lumbar back: She exhibits tenderness, deformity and pain. She exhibits normal range of motion, no bony tenderness, no swelling, no edema and no spasm.       Back:           Assessment & Plan:  Spinal stenosis at L4-L5 level - Plan: Ambulatory referral to Orthopedic Surgery  COPD with acute exacerbation (HCC)  Chronic midline low back pain without sciatica  COPD exacerbation is improving.  I recommended continued compliance with Symbicort and Spiriva.  I see no role for continued prednisone or longer duration of antibiotics.  Given her chronic low back pain, please see MRI above, I will recommend a referral to orthopedic surgery specifically she request Dr. Jacelyn Grip, to discuss possible steroid injections in her back to alleviate the pain.

## 2018-02-07 NOTE — Progress Notes (Signed)
Cardiology Office Note:    Date:  02/08/2018   ID:  Mary Blevins, DOB 02-08-1945, MRN [id number]  PCP:  [name], MD  Cardiologist:  Sinclair Grooms, MD   Referring MD: [name], MD   Chief Complaint  Patient presents with  . Irregular Heart Beat    PAT  . COPD    History of Present Illness:    Mary Blevins is a 73 y.o. female with a hx of PSVT, COPD, elevated lipids, and remote history of mitral valve prolapse.  Mary Blevins is doing relatively well.  She is mourning the death of her mother which occurred in November.  She becomes tearful when she talks about being without her mother who is 37 years of age.  She has not had any sustained tachycardia or palpitation.  She denies chest pain.  Still abstaining from cigarette smoking.  Began losing weight after the death of her mother but she feels that the weight has stabilized recently.    Past Medical History:  Diagnosis Date  . Arthritis   . COPD (chronic obstructive pulmonary disease) ([place])   . Elevated lipids   . Generalized headaches   . History of pneumonia   . Hyperlipidemia   . Hypothyroidism   . MVP (mitral valve prolapse)    palpitations  . Osteopenia   . Seasonal allergies   . Sinusitis   . Tubular adenoma of colon     Past Surgical History:  Procedure Laterality Date  . COLONOSCOPY WITH PROPOFOL N/A 01/06/2016   Procedure: COLONOSCOPY WITH PROPOFOL;  Surgeon: Doran Stabler, MD;  Location: [facility];  Service: Gastroenterology;  Laterality: N/A;  . DILATION AND CURETTAGE OF UTERUS    . THYROIDECTOMY    . TONSILLECTOMY      Current Medications: Current Meds  Medication Sig  . budesonide-formoterol (SYMBICORT) 160-4.5 MCG/ACT inhaler Inhale 2 puffs into the lungs 2 (two) times daily.  . fluticasone (FLONASE) 50 MCG/ACT nasal spray Place 2 sprays into both nostrils daily.  Marland Kitchen HYDROcodone-homatropine (HYCODAN) 5-1.5 MG/5ML syrup Take 5 mLs by mouth every 8 (eight)  hours as needed for cough.  . levothyroxine (SYNTHROID, LEVOTHROID) 50 MCG tablet TAKE 1 TABLET (50 MCG TOTAL) BY MOUTH DAILY BEFORE BREAKFAST.  Marland Kitchen Polyethyl Glycol-Propyl Glycol (SYSTANE) 0.4-0.3 % SOLN Place 1 drop into both eyes daily.   . rosuvastatin (CRESTOR) 5 MG tablet Take 1 tablet (5 mg total) by mouth daily.  . Tiotropium Bromide Monohydrate (SPIRIVA RESPIMAT) 2.5 MCG/ACT AERS Inhale 1 Inhaler into the lungs daily.  Marland Kitchen tiZANidine (ZANAFLEX) 4 MG tablet TAKE 1 TABLET (4 MG TOTAL) BY MOUTH EVERY 6 (SIX) HOURS AS NEEDED FOR MUSCLE SPASMS.  . verapamil (CALAN-SR) 120 MG CR tablet Take 1 tablet (120 mg total) by mouth daily.     Allergies:   Meloxicam   Social History   Socioeconomic History  . Marital status: Widowed    Spouse name: Not on file  . Number of children: 0  . Years of education: Not on file  . Highest education level: Not on file  Occupational History    Employer: [employer]  Social Needs  . Financial resource strain: Not on file  . Food insecurity:    Worry: Not on file    Inability: Not on file  . Transportation needs:    Medical: Not on file    Non-medical: Not on file  Tobacco Use  . Smoking status: Former Smoker  Last attempt to quit: 07/24/2005    Years since quitting: 12.5  . Smokeless tobacco: Never Used  Substance and Sexual Activity  . Alcohol use: Yes    Comment: rare  . Drug use: No  . Sexual activity: Not on file  Lifestyle  . Physical activity:    Days per week: Not on file    Minutes per session: Not on file  . Stress: Not on file  Relationships  . Social connections:    Talks on phone: Not on file    Gets together: Not on file    Attends religious service: Not on file    Active member of club or organization: Not on file    Attends meetings of clubs or organizations: Not on file    Relationship status: Not on file  Other Topics Concern  . Not on file  Social History Narrative  . Not on file     Family  History: The patient's family history includes Breast cancer in her maternal aunt; Dementia in her mother; Diabetes in her brother; Healthy in her father and sister; Heart attack in her maternal grandmother, mother, and another family member; Heart disease in her mother; Prostate cancer in her maternal grandfather. There is no history of Colon cancer, Esophageal cancer, Rectal cancer, or Stomach cancer.  ROS:   Please see the history of present illness.    Has chronic shortness of breath, vision disturbance, unexplained weight loss as mentioned above that seems to now have stabilized.  Chronic low back and muscle pain.  Discomfort in her legs when sitting and wonders if it could represent PAD.  All other systems reviewed and are negative.  EKGs/Labs/Other Studies Reviewed:    The following studies were reviewed today: She has had no recent cardiovascular imaging or functional testing.   Most recent triglyceride 218 July 2019  LDL cholesterol 42 July 30, 2017  EKG:  EKG normal sinus rhythm, short PR interval, nonspecific T wave flattening, QS pattern V1 and V2.  When compared to June 2018, no significant change.  PVCs present on the prior tracing are no longer present.  Recent Labs: 03/15/2017: BUN 15; Creat 0.94; Hemoglobin 12.5; Platelets 402; Potassium 4.5; Sodium 140 07/30/2017: ALT 12  Recent Lipid Panel    Component Value Date/Time   CHOL 145 07/30/2017 0804   TRIG 218 (H) 07/30/2017 0804   HDL 59 07/30/2017 0804   CHOLHDL 2.5 07/30/2017 0804   CHOLHDL 4.0 12/14/2014 1046   VLDL 44 (H) 12/14/2014 1046   LDLCALC 42 07/30/2017 0804    Physical Exam:    VS:  BP 124/72   Pulse 67   Ht 5' 5.5" (1.664 m)   Wt 152 lb 12.8 oz (69.3 kg)   SpO2 98%   BMI 25.04 kg/m     Wt Readings from Last 3 Encounters:  02/08/18 152 lb 12.8 oz (69.3 kg)  01/31/18 155 lb (70.3 kg)  01/24/18 154 lb (69.9 kg)     GEN: Has lost weight.  Still appears healthy.. No acute distress HEENT:  Normal NECK: No JVD. LYMPHATICS: No lymphadenopathy CARDIAC: RRR.  No murmur, no gallop, no edema VASCULAR: 2+ bilateral radial, carotid, and posterior tibial pulses, no carotid bruits RESPIRATORY:  Clear to auscultation without rales, wheezing or rhonchi  ABDOMEN: Soft, non-tender, non-distended, No pulsatile mass, MUSCULOSKELETAL: No deformity  SKIN: Warm and dry NEUROLOGIC:  Alert and oriented x 3 PSYCHIATRIC:  Normal affect   ASSESSMENT:    1. PSVT (paroxysmal supraventricular  tachycardia) ([place])   2. Aortic atherosclerosis ([place])   3. Chronic obstructive pulmonary disease, unspecified COPD type ([place])   4. Other hyperlipidemia    PLAN:    In order of problems listed above:  1. No significant recurrence.  Short PR interval on EKG suggesting set up for LGL/AV node accessory pathway.  In absence of symptoms, no further evaluation or changes are required. 2. Generalized atherosclerosis denoted by aortic atherosclerosis.  We doing a good job of controlling LDL cholesterol.  Consider Ecosopaentanoic acid.  This could add an additional 20% reduction in vascular events. 3. Not addressed.  Not smoking.  Encouraged continued abstinence. 4. Continue statin therapy.  Overall education and awareness concerning primary/secondary risk prevention was discussed in detail: LDL less than 70, hemoglobin A1c less than 7, blood pressure target less than 130/80 mmHg, >150 minutes of moderate aerobic activity per week, avoidance of smoking, weight control (via diet and exercise), and continued surveillance/management of/for obstructive sleep apnea.   1 year follow-up.  Notify us if palpitations or chest pain.   Medication Adjustments/Labs and Tests Ordered: Current medicines are reviewed at length with the patient today.  Concerns regarding medicines are outlined above.  Orders Placed This Encounter  Procedures  . EKG 12-Lead   No orders of the defined types were placed in this  encounter.   Patient Instructions  Medication Instructions:  Your physician recommends that you continue on your current medications as directed. Please refer to the Current Medication list given to you today.  If you need a refill on your cardiac medications before your next appointment, please call your pharmacy.   Lab work: None If you have labs (blood work) drawn today and your tests are completely normal, you will receive your results only by: Marland Kitchen MyChart Message (if you have MyChart) OR . A paper copy in the mail If you have any lab test that is abnormal or we need to change your treatment, we will call you to review the results.  Testing/Procedures: None  Follow-Up: At [facility], you and your health needs are our priority.  As part of our continuing mission to provide you with exceptional heart care, we have created designated Provider Care Teams.  These Care Teams include your primary Cardiologist (physician) and Advanced Practice Providers (APPs -  Physician Assistants and Nurse Practitioners) who all work together to provide you with the care you need, when you need it. You will need a follow up appointment in 12 months.  Please call our office 2 months in advance to schedule this appointment.  You may see Sinclair Grooms, MD or one of the following Advanced Practice Providers on your designated Care Team:   Truitt Merle, NP Cecilie Kicks, NP . Kathyrn Drown, NP  Any Other Special Instructions Will Be Listed Below (If Applicable).       Signed, Sinclair Grooms, MD  02/08/2018 12:49 PM    [place]

## 2018-02-08 ENCOUNTER — Ambulatory Visit: Payer: Medicare HMO | Admitting: Interventional Cardiology

## 2018-02-08 ENCOUNTER — Encounter: Payer: Self-pay | Admitting: Interventional Cardiology

## 2018-02-08 ENCOUNTER — Encounter (INDEPENDENT_AMBULATORY_CARE_PROVIDER_SITE_OTHER): Payer: Self-pay

## 2018-02-08 VITALS — BP 124/72 | HR 67 | Ht 65.5 in | Wt 152.8 lb

## 2018-02-08 DIAGNOSIS — J449 Chronic obstructive pulmonary disease, unspecified: Secondary | ICD-10-CM

## 2018-02-08 DIAGNOSIS — I700 Atherosclerosis of aorta: Secondary | ICD-10-CM

## 2018-02-08 DIAGNOSIS — E7849 Other hyperlipidemia: Secondary | ICD-10-CM

## 2018-02-08 DIAGNOSIS — I471 Supraventricular tachycardia: Secondary | ICD-10-CM

## 2018-02-08 NOTE — Patient Instructions (Signed)

## 2018-02-19 ENCOUNTER — Other Ambulatory Visit: Payer: Self-pay | Admitting: Family Medicine

## 2018-02-19 ENCOUNTER — Other Ambulatory Visit: Payer: Self-pay | Admitting: Interventional Cardiology

## 2018-02-19 NOTE — Telephone Encounter (Signed)
Requesting refill    Hycodan Syrup  LOV: 01/31/2018  LRF:  01/08/18

## 2018-03-19 DIAGNOSIS — M48061 Spinal stenosis, lumbar region without neurogenic claudication: Secondary | ICD-10-CM | POA: Diagnosis not present

## 2018-03-27 ENCOUNTER — Other Ambulatory Visit: Payer: Self-pay | Admitting: Family Medicine

## 2018-03-27 DIAGNOSIS — J328 Other chronic sinusitis: Secondary | ICD-10-CM

## 2018-03-27 MED ORDER — LEVOCETIRIZINE DIHYDROCHLORIDE 5 MG PO TABS: 5.0000 mg | ORAL_TABLET | Freq: Every evening | ORAL | 3 refills | Status: DC

## 2018-04-03 DIAGNOSIS — I10 Essential (primary) hypertension: Secondary | ICD-10-CM | POA: Diagnosis not present

## 2018-04-03 DIAGNOSIS — J449 Chronic obstructive pulmonary disease, unspecified: Secondary | ICD-10-CM | POA: Diagnosis not present

## 2018-04-03 DIAGNOSIS — J301 Allergic rhinitis due to pollen: Secondary | ICD-10-CM | POA: Diagnosis not present

## 2018-04-03 DIAGNOSIS — J9611 Chronic respiratory failure with hypoxia: Secondary | ICD-10-CM | POA: Diagnosis not present

## 2018-04-18 ENCOUNTER — Telehealth: Payer: Self-pay | Admitting: Family Medicine

## 2018-04-18 ENCOUNTER — Other Ambulatory Visit: Payer: Self-pay | Admitting: Family Medicine

## 2018-04-18 DIAGNOSIS — J328 Other chronic sinusitis: Secondary | ICD-10-CM

## 2018-04-18 NOTE — Telephone Encounter (Signed)
Pt needs refill on flonase and xyzal to [facility]

## 2018-04-19 MED ORDER — LEVOCETIRIZINE DIHYDROCHLORIDE 5 MG PO TABS: ORAL_TABLET | ORAL | 3 refills | Status: DC

## 2018-04-19 MED ORDER — FLUTICASONE PROPIONATE 50 MCG/ACT NA SUSP: 2.0000 | Freq: Every day | NASAL | 2 refills | Status: DC

## 2018-04-19 NOTE — Telephone Encounter (Signed)
Medication called/sent to requested pharmacy  

## 2018-04-20 DIAGNOSIS — J449 Chronic obstructive pulmonary disease, unspecified: Secondary | ICD-10-CM | POA: Diagnosis not present

## 2018-04-29 ENCOUNTER — Ambulatory Visit (INDEPENDENT_AMBULATORY_CARE_PROVIDER_SITE_OTHER): Payer: Medicare HMO | Admitting: Family Medicine

## 2018-04-29 ENCOUNTER — Other Ambulatory Visit: Payer: Self-pay

## 2018-04-29 DIAGNOSIS — E039 Hypothyroidism, unspecified: Secondary | ICD-10-CM

## 2018-04-29 DIAGNOSIS — M48061 Spinal stenosis, lumbar region without neurogenic claudication: Secondary | ICD-10-CM

## 2018-04-29 DIAGNOSIS — M545 Low back pain, unspecified: Secondary | ICD-10-CM

## 2018-04-29 DIAGNOSIS — G8929 Other chronic pain: Secondary | ICD-10-CM

## 2018-04-29 MED ORDER — LEVOTHYROXINE SODIUM 50 MCG PO TABS: ORAL_TABLET | ORAL | 0 refills | Status: DC

## 2018-04-29 NOTE — Progress Notes (Signed)
Subjective:    Patient ID: Mary Blevins, female    DOB: 1945/05/08, 73 y.o.   MRN: [id number]  HPI1/2/20 Patient was recently seen in the emergency room on December 15 and was diagnosed with bronchitis.  Patient has a history of COPD.  Previously this was managed with Trelegy.  Her pulmonologist switched her from Trelegy to the combination of Symbicort and Spiriva.  However the patient discontinue Symbicort due to cost and is only been taking the Spiriva.  Recently she has developed an upper respiratory infection that has persisted now for more than 2 weeks.  She is having copious amounts of chest congestion.  She has a cough in the morning productive of yellow and green sputum.  This is a difference from her baseline.  Normally she does not have a productive cough and certainly without color.  She has increasing shortness of breath.  She has diminished breath sounds bilaterally with faint expiratory wheezing.  She is only been using her rescue inhaler once a day.  At that time, my plan was  01/31/18 Patient's breathing is much better.  Her lungs are clear today on exam.  She is not wheezing.  The congestion and rhonchi have cleared.  She continues have some chest congestion but she has been able to cough it up now.  She is approximately 90% better compared to last time.  She is also consistently taking her Symbicort and Spiriva.  She continues to complain of low back pain.  Her most recent MRI of her back was obtained in 2017.  L1-2:  No impingement.  Disc bulge noted.  L2-3: Moderate central narrowing of the thecal sac with mild displacement of the left L2 nerve in the lateral extraforaminal space due to disc bulge, short pedicles, facet arthropathy, and left inferior foraminal and lateral extraforaminal disc protrusion.  L3-4: Moderate central narrowing of the thecal sac and mild right subarticular lateral recess stenosis due to short pedicles, disc bulge, and facet  arthropathy.  L4-5: Moderate central narrowing of the thecal sac with moderate right foraminal stenosis, moderate right subarticular lateral recess stenosis, and moderate displacement of the right L4 nerve in the lateral extraforaminal space due to intervertebral spurring, disc bulge, right lateral extraforaminal disc protrusion, and facet arthropathy.  L5-S1: Mild central narrowing of the thecal sac with mild bilateral foraminal stenosis as well as mild left and borderline right subarticular lateral recess stenosis due to central disc protrusion, disc bulge, and facet arthropathy.  S1- 2:  No impingement, rudimentary disc.  She had moderate spinal stenosis of the lumbar spine.  Mild displacement of the left L2 nerve at the foramen.  Moderate displacement of the right L4 nerve due to spurring and bulging disc at L4-L5.  At that point we had recommended physical therapy and conservative treatment options.  These have not been beneficial.  The patient continues to complain of lower back pain roughly at the level of L3-L5.  She is not interested in surgery but is interested to see if possible epidural steroid injections may help her pain.  At the present time her pain is primarily located in the center of her back and also occasionally into her posterior left superior gluteus.  Muscle relaxers, physical therapy, and arthritis medication such as ibuprofen have provided her little relief.  At that time, my plan was: COPD exacerbation is improving.  I recommended continued compliance with Symbicort and Spiriva.  I see no role for continued prednisone or longer duration of antibiotics.  Given her chronic low back pain, please see MRI above, I will recommend a referral to orthopedic surgery specifically she request Dr. Jacelyn Grip, to discuss possible steroid injections in her back to alleviate the pain.  04/29/18 Patient is being seen today for low back pain.  Patient is being seen as a telephone visit.  She  consents to be seen as a telephone visit.  Patient is currently at home.  I am currently in my office.  Phone call began at 2:07 PM .  Patient is calling today requesting information regarding her back.  Originally she had an epidural steroid injection scheduled for March 19.  However this was canceled.  It was rescheduled to April 2 however this was canceled due to the coronavirus pandemic.  It has been rescheduled to April 28.  Therefore she has yet to receive an epidural steroid injection in her back.  She is working a part-time job.  She has had this job since December.  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  Prolonged periods of standing however causing her severe lower back pain.  She states that she is unable to do her job.  She has to sit down frequently throughout the day and the pain in her back is only worsening.  Therefore she is asking me how to apply for disability.  Patient is currently receiving Social Security.  She denies any bowel or bladder incontinence.  She denies any leg weakness.  She denies any saddle anesthesia or symptoms of cauda equina syndrome.  She is also requesting a refill on her levothyroxine.  She is overdue to recheck a TSH however with the recent coronavirus pandemic, I would be glad to refill her levothyroxine temporarily for 2 days until this current situation hopefully abates and will be safer for her to come in for lab work.   Past Medical History:  Diagnosis Date   Arthritis    COPD (chronic obstructive pulmonary disease) (HCC)    Elevated lipids    Generalized headaches    History of pneumonia    Hyperlipidemia    Hypothyroidism    MVP (mitral valve prolapse)    palpitations   Osteopenia    Seasonal allergies    Sinusitis    Tubular adenoma of colon    Past Surgical History:  Procedure Laterality Date   COLONOSCOPY WITH PROPOFOL N/A 01/06/2016   Procedure: COLONOSCOPY WITH PROPOFOL;  Surgeon: Doran Stabler,  MD;  Location: [facility];  Service: Gastroenterology;  Laterality: N/A;   DILATION AND CURETTAGE OF UTERUS     THYROIDECTOMY     TONSILLECTOMY     Current Outpatient Medications on File Prior to Visit  Medication Sig Dispense Refill   budesonide-formoterol (SYMBICORT) 160-4.5 MCG/ACT inhaler Inhale 2 puffs into the lungs 2 (two) times daily. 1 Inhaler 3   fluticasone (FLONASE) 50 MCG/ACT nasal spray Place 2 sprays into both nostrils daily. 16 g 2   HYDROcodone-homatropine (HYCODAN) 5-1.5 MG/5ML syrup Take 5 mLs by mouth every 8 (eight) hours as needed for cough. 120 mL 0   levocetirizine (XYZAL) 5 MG tablet TAKE 1 TABLET BY MOUTH EVERY DAY IN THE EVENING 90 tablet 3   Polyethyl Glycol-Propyl Glycol (SYSTANE) 0.4-0.3 % SOLN Place 1 drop into both eyes daily.      rosuvastatin (CRESTOR) 5 MG tablet Take 1 tablet (5 mg total) by mouth daily. 90 tablet 3   Tiotropium Bromide Monohydrate (SPIRIVA RESPIMAT) 2.5 MCG/ACT AERS Inhale 1  Inhaler into the lungs daily. 1 Inhaler 4   tiZANidine (ZANAFLEX) 4 MG tablet TAKE 1 TABLET (4 MG TOTAL) BY MOUTH EVERY 6 (SIX) HOURS AS NEEDED FOR MUSCLE SPASMS. 30 tablet 0   verapamil (CALAN-SR) 120 MG CR tablet TAKE 1 TABLET BY MOUTH EVERY DAY 90 tablet 3   No current facility-administered medications on file prior to visit.    Allergies  Allergen Reactions   Meloxicam Other (See Comments)    BAD DREAMS, SAD THOUGHTS   Social History   Socioeconomic History   Marital status: Widowed    Spouse name: Not on file   Number of children: 0   Years of education: Not on file   Highest education level: Not on file  Occupational History    Employer: [employer] resource strain: Not on file   Food insecurity:    Worry: Not on file    Inability: Not on file   Transportation needs:    Medical: Not on file    Non-medical: Not on file  Tobacco Use   Smoking status: Former Smoker    Last attempt to  quit: 07/24/2005    Years since quitting: 12.7   Smokeless tobacco: Never Used  Substance and Sexual Activity   Alcohol use: Yes    Comment: rare   Drug use: No   Sexual activity: Not on file  Lifestyle   Physical activity:    Days per week: Not on file    Minutes per session: Not on file   Stress: Not on file  Relationships   Social connections:    Talks on phone: Not on file    Gets together: Not on file    Attends religious service: Not on file    Active member of club or organization: Not on file    Attends meetings of clubs or organizations: Not on file    Relationship status: Not on file   Intimate partner violence:    Fear of current or ex partner: Not on file    Emotionally abused: Not on file    Physically abused: Not on file    Forced sexual activity: Not on file  Other Topics Concern   Not on file  Social History Narrative   Not on file    Past Medical History:  Diagnosis Date   Arthritis    COPD (chronic obstructive pulmonary disease) ([place])    Elevated lipids    Generalized headaches    History of pneumonia    Hyperlipidemia    Hypothyroidism    MVP (mitral valve prolapse)    palpitations   Osteopenia    Seasonal allergies    Sinusitis    Tubular adenoma of colon    Past Surgical History:  Procedure Laterality Date   COLONOSCOPY WITH PROPOFOL N/A 01/06/2016   Procedure: COLONOSCOPY WITH PROPOFOL;  Surgeon: Doran Stabler, MD;  Location: [facility];  Service: Gastroenterology;  Laterality: N/A;   DILATION AND CURETTAGE OF UTERUS     THYROIDECTOMY     TONSILLECTOMY     Current Outpatient Medications on File Prior to Visit  Medication Sig Dispense Refill   budesonide-formoterol (SYMBICORT) 160-4.5 MCG/ACT inhaler Inhale 2 puffs into the lungs 2 (two) times daily. 1 Inhaler 3   fluticasone (FLONASE) 50 MCG/ACT nasal spray Place 2 sprays into both nostrils daily. 16 g 2   HYDROcodone-homatropine (HYCODAN) 5-1.5 MG/5ML  syrup Take 5 mLs by mouth every 8 (eight) hours  as needed for cough. 120 mL 0   levocetirizine (XYZAL) 5 MG tablet TAKE 1 TABLET BY MOUTH EVERY DAY IN THE EVENING 90 tablet 3   Polyethyl Glycol-Propyl Glycol (SYSTANE) 0.4-0.3 % SOLN Place 1 drop into both eyes daily.      rosuvastatin (CRESTOR) 5 MG tablet Take 1 tablet (5 mg total) by mouth daily. 90 tablet 3   Tiotropium Bromide Monohydrate (SPIRIVA RESPIMAT) 2.5 MCG/ACT AERS Inhale 1 Inhaler into the lungs daily. 1 Inhaler 4   tiZANidine (ZANAFLEX) 4 MG tablet TAKE 1 TABLET (4 MG TOTAL) BY MOUTH EVERY 6 (SIX) HOURS AS NEEDED FOR MUSCLE SPASMS. 30 tablet 0   verapamil (CALAN-SR) 120 MG CR tablet TAKE 1 TABLET BY MOUTH EVERY DAY 90 tablet 3   No current facility-administered medications on file prior to visit.    Allergies  Allergen Reactions   Meloxicam Other (See Comments)    BAD DREAMS, SAD THOUGHTS   Social History   Socioeconomic History   Marital status: Widowed    Spouse name: Not on file   Number of children: 0   Years of education: Not on file   Highest education level: Not on file  Occupational History    Employer: [employer]  Social Needs   Financial resource strain: Not on file   Food insecurity:    Worry: Not on file    Inability: Not on file   Transportation needs:    Medical: Not on file    Non-medical: Not on file  Tobacco Use   Smoking status: Former Smoker    Last attempt to quit: 07/24/2005    Years since quitting: 12.7   Smokeless tobacco: Never Used  Substance and Sexual Activity   Alcohol use: Yes    Comment: rare   Drug use: No   Sexual activity: Not on file  Lifestyle   Physical activity:    Days per week: Not on file    Minutes per session: Not on file   Stress: Not on file  Relationships   Social connections:    Talks on phone: Not on file    Gets together: Not on file    Attends religious service: Not on file    Active member of club or organization:  Not on file    Attends meetings of clubs or organizations: Not on file    Relationship status: Not on file   Intimate partner violence:    Fear of current or ex partner: Not on file    Emotionally abused: Not on file    Physically abused: Not on file    Forced sexual activity: Not on file  Other Topics Concern   Not on file  Social History Narrative   Not on file      Review of Systems  All other systems reviewed and are negative.      Objective:  No physical exam was performed today as the patient was being seen as a telephone visit      Assessment & Plan:   Chronic midline low back pain without sciatica  Spinal stenosis at L4-L5 level  Hypothyroidism, unspecified type - Plan: CBC with Differential/Platelet, COMPLETE METABOLIC PANEL WITH GFR, TSH  Patient has moderate spinal stenosis at L4-L5.  I explained to the patient that I do not understand or know the legality of applying for disability with regards to her Social Security.  However I explained to the patient that the process begins at the [employer] office.  She could apply for disability there.  They would arrange likely a physical exam to determine her eligibility for disability.  Patient does have chronic low back pain that has tried and failed NSAIDs, muscle relaxers, and physical therapy.  She has yet to try epidural steroid injections.  Therefore I am not certain that she would qualify for disability at this point as the patient could perform jobs that do not require prolonged standing.  However I explained to the patient that I do not determine her need for disability.  I would like the patient to come in at her convenience to check a TSH for her hypothyroidism but I will gladly refill her levothyroxine 50 mcg p.o. every morning temporarily and I gave her a 90-day supply to allow the current coronavirus pandemic to abate.  Phone call concluded at 2:21 PM

## 2018-05-06 ENCOUNTER — Telehealth: Payer: Self-pay | Admitting: Family Medicine

## 2018-05-06 NOTE — Telephone Encounter (Signed)
Pt called Sat 4/11 evening States she has a swelling near her rectum, Was constipated and used an enema a few days before. No itching or pain, no blood in stool, no abd pain Also has stool softners and miralax at home but has not been taking Advised to do sitz bath with epson salt three times a day likely hemorrhoid  She was also using vaseline and neosporin Take miralax 1 cap full daily   Advised if no improved call Monday for OV

## 2018-05-08 ENCOUNTER — Encounter: Payer: Self-pay | Admitting: Family Medicine

## 2018-05-08 ENCOUNTER — Other Ambulatory Visit: Payer: Self-pay

## 2018-05-08 ENCOUNTER — Ambulatory Visit (INDEPENDENT_AMBULATORY_CARE_PROVIDER_SITE_OTHER): Payer: Medicare HMO | Admitting: Family Medicine

## 2018-05-08 VITALS — BP 134/76 | HR 62 | Temp 98.6°F | Resp 14 | Ht 65.5 in | Wt 149.0 lb

## 2018-05-08 DIAGNOSIS — K644 Residual hemorrhoidal skin tags: Secondary | ICD-10-CM | POA: Diagnosis not present

## 2018-05-08 DIAGNOSIS — F4321 Adjustment disorder with depressed mood: Secondary | ICD-10-CM | POA: Diagnosis not present

## 2018-05-08 DIAGNOSIS — R634 Abnormal weight loss: Secondary | ICD-10-CM

## 2018-05-08 DIAGNOSIS — E039 Hypothyroidism, unspecified: Secondary | ICD-10-CM | POA: Diagnosis not present

## 2018-05-08 MED ORDER — HYDROCORTISONE 2.5 % RE CREA: TOPICAL_CREAM | RECTAL | 2 refills | Status: DC

## 2018-05-08 NOTE — Patient Instructions (Addendum)
Try Breeze- fruit flavored or Carnation instant breakfast  Get sitz bath - goes on toilet from [place] cortisone twice a day on rectum We will call with lab results F/U Dr. Dennard Schaumann 6 weeks for weight

## 2018-05-08 NOTE — Progress Notes (Signed)
Subjective:    Patient ID: Mary Blevins, female    DOB: [birth year], 73 y.o.   MRN: [id number]  Patient presents for Perianal Abscess (x6 days- boil like area to skin near rectum- reports that she tried to give herself enema and may have irritated skin surrounding rectum)  Pt called after hours this past weekend, see phone note. Has been straining with BM, has chronic constipation. Has not been taking stool softener or miralax. Decided to use enema. After BM, noted a swelling which she thought was a boil. She was using Vaseline and antibiotic ointment. Advised this was most likely a hemorrhoid and needed sitz bath, use stool softener, take miralax. She did 1 sitzbath, which helped but has difficulty with her back issues getting into tub. No longer has tenderness over swelling but still there and she was concerned. She is having regular BM now, also eating prunes.  She is also concerned about her weight loss, was 155 in March, 1 year ago was 176. Admits after her mother passed in Sept, she has not been eating as well, has been sad and stressed. PCP needed labs done for her thyroid as well to see if this was contributing She tried ensure/boost but causes her to be more congested States she does sleep good No pain elsewhere   Review Of Systems:  GEN- denies fatigue, fever, +weight loss,weakness, recent illness HEENT- denies eye drainage, change in vision, nasal discharge, CVS- denies chest pain, palpitations RESP- denies SOB, cough, wheeze ABD- denies N/V, change in stools, abd pain GU- denies dysuria, hematuria, dribbling, incontinence MSK- denies joint pain, muscle aches, injury Neuro- denies headache, dizziness, syncope, seizure activity       Objective:    BP 134/76   Pulse 62   Temp 98.6 F (37 C) (Oral)   Resp 14   Ht 5' 5.5" (1.664 m)   Wt 149 lb (67.6 kg)   SpO2 97%   BMI 24.42 kg/m  GEN- NAD, alert and oriented x3 HEENT- PERRL, EOMI, non injected sclera, pink  conjunctiva, MMM, oropharynx clear Neck- Supple, no thyromegaly CVS- RRR, no murmur RESP-CTAB ABD-NABS,soft,NT,ND GU- external hemorroid- soft thrombosis, NT, no active bleeding, no erythema Psych- tearful discussing mother, otherwise normal affect and mood EXT- No edema Pulses- Radial 2+        Assessment & Plan:      Problem List Items Addressed This Visit      Unprioritized   Hypothyroid    Other Visit Diagnoses    External hemorrhoid    -  Primary   Ext hemrroid appears thrombosed but now > 72 hours and soft and NON TENDER, so will allow blood to resolve on its own, with sitz bath ( we called local pharmacy they have a device over toilet that be used in setting of her back pain instead of getting into tub) Cortisone topically and internally as she also has internal hemorrhoids noted in chart   Weight loss       weight loss likely MTF with her age, grief over her mother, decreased appetite, thyroid may also be a factor, check labs, advised Breeze or carnation instant breakfast If labs normal, in setting of grief as well, can try remeron 7.5mg  at bedtime F/U 6 weeks with PCP for weight    Grief reaction          Note: This dictation was prepared with Dragon dictation along with smaller phrase technology. Any transcriptional errors that result from this process are  unintentional.

## 2018-05-09 ENCOUNTER — Encounter: Payer: Self-pay | Admitting: Family Medicine

## 2018-05-09 LAB — CBC WITH DIFFERENTIAL/PLATELET
Absolute Monocytes: 624 cells/uL (ref 200–950)
Basophils Absolute: 16 cells/uL (ref 0–200)
Basophils Relative: 0.2 %
Eosinophils Absolute: 203 cells/uL (ref 15–500)
Eosinophils Relative: 2.5 %
HCT: 41 % (ref 35.0–45.0)
Hemoglobin: 14.1 g/dL (ref 11.7–15.5)
Lymphs Abs: 2535 cells/uL (ref 850–3900)
MCH: 29.5 pg (ref 27.0–33.0)
MCHC: 34.4 g/dL (ref 32.0–36.0)
MCV: 85.8 fL (ref 80.0–100.0)
MPV: 9.9 fL (ref 7.5–12.5)
Monocytes Relative: 7.7 %
Neutro Abs: 4722 cells/uL (ref 1500–7800)
Neutrophils Relative %: 58.3 %
Platelets: 409 10*3/uL — ABNORMAL HIGH (ref 140–400)
RBC: 4.78 10*6/uL (ref 3.80–5.10)
RDW: 12.8 % (ref 11.0–15.0)
Total Lymphocyte: 31.3 %
WBC: 8.1 10*3/uL (ref 3.8–10.8)

## 2018-05-09 LAB — COMPLETE METABOLIC PANEL WITH GFR
AG Ratio: 1.5 (calc) (ref 1.0–2.5)
ALT: 13 U/L (ref 6–29)
AST: 22 U/L (ref 10–35)
Albumin: 4.1 g/dL (ref 3.6–5.1)
Alkaline phosphatase (APISO): 54 U/L (ref 37–153)
BUN: 10 mg/dL (ref 7–25)
CO2: 26 mmol/L (ref 20–32)
Calcium: 9.7 mg/dL (ref 8.6–10.4)
Chloride: 103 mmol/L (ref 98–110)
Creat: 0.89 mg/dL (ref 0.60–0.93)
GFR, Est African American: 75 mL/min/{1.73_m2} (ref >=60)
GFR, Est Non African American: 64 mL/min/{1.73_m2} (ref >=60)
Globulin: 2.8 g/dL (calc) (ref 1.9–3.7)
Glucose, Bld: 94 mg/dL (ref 65–99)
Potassium: 4.8 mmol/L (ref 3.5–5.3)
Sodium: 139 mmol/L (ref 135–146)
Total Bilirubin: 0.7 mg/dL (ref 0.2–1.2)
Total Protein: 6.9 g/dL (ref 6.1–8.1)

## 2018-05-09 LAB — TSH: TSH: 1.99 mIU/L (ref 0.40–4.50)

## 2018-05-10 ENCOUNTER — Other Ambulatory Visit: Payer: Self-pay | Admitting: Family Medicine

## 2018-05-10 MED ORDER — MIRTAZAPINE 30 MG PO TABS: 30.0000 mg | ORAL_TABLET | Freq: Every day | ORAL | 3 refills | Status: DC

## 2018-05-21 DIAGNOSIS — M48062 Spinal stenosis, lumbar region with neurogenic claudication: Secondary | ICD-10-CM | POA: Diagnosis not present

## 2018-05-21 DIAGNOSIS — J9611 Chronic respiratory failure with hypoxia: Secondary | ICD-10-CM | POA: Diagnosis not present

## 2018-05-21 DIAGNOSIS — J449 Chronic obstructive pulmonary disease, unspecified: Secondary | ICD-10-CM | POA: Diagnosis not present

## 2018-06-02 ENCOUNTER — Other Ambulatory Visit: Payer: Self-pay | Admitting: Family Medicine

## 2018-06-20 ENCOUNTER — Ambulatory Visit (INDEPENDENT_AMBULATORY_CARE_PROVIDER_SITE_OTHER): Payer: Medicare HMO | Admitting: Family Medicine

## 2018-06-20 ENCOUNTER — Encounter: Payer: Self-pay | Admitting: Family Medicine

## 2018-06-20 ENCOUNTER — Other Ambulatory Visit: Payer: Self-pay

## 2018-06-20 VITALS — BP 130/84 | HR 70 | Temp 98.7°F | Resp 16 | Ht 65.5 in | Wt 149.0 lb

## 2018-06-20 DIAGNOSIS — J9611 Chronic respiratory failure with hypoxia: Secondary | ICD-10-CM | POA: Diagnosis not present

## 2018-06-20 DIAGNOSIS — R634 Abnormal weight loss: Secondary | ICD-10-CM | POA: Diagnosis not present

## 2018-06-20 DIAGNOSIS — J449 Chronic obstructive pulmonary disease, unspecified: Secondary | ICD-10-CM | POA: Diagnosis not present

## 2018-06-20 DIAGNOSIS — N858 Other specified noninflammatory disorders of uterus: Secondary | ICD-10-CM

## 2018-06-20 NOTE — Progress Notes (Signed)
Subjective:    Patient ID: Mary Blevins, female    DOB: 1945/04/19, 73 y.o.   MRN: [id number]  HPI Wt Readings from Last 3 Encounters:  06/20/18 149 lb (67.6 kg)  05/08/18 149 lb (67.6 kg)  02/08/18 152 lb 12.8 oz (69.3 kg)   I am seeing the patient today for weight loss.  Since I last saw the patient in April, her weight has maintained stable at 149 pounds.  She took 1 dose of Remeron but discontinued the medication after 1 dose because it caused her to feel too sleepy the next day.  She reports feeling sad and lonely.  She has lost several family members including her husband, her mother, and several other members of her family over the last few years.  This leaves her feeling alone and sad at times.  She admits that she constantly thinks about them and even dreams about them.  She does feel depressed at times however this is mild and she feels that she does not require medication for it.  She still receives joy from life.  She denies any suicidal thoughts or panic attacks although she does occasionally have anxiety.  I explained to the patient that I feel this is most likely reason why her weight was dropping.  I believe she was grieving the loss of her mother and this affected her appetite.  This seems to be getting better just over the passage of time.  However today on her exam, there is a palpable mass in the suprapubic area that I suspect could be a uterine fibroid however I have not appreciated this before on exam.  It is firm.  It is roughly the diameter of a tennis ball.  However it has vague margins and is difficult to ascertain size completely Past Medical History:  Diagnosis Date  . Arthritis   . COPD (chronic obstructive pulmonary disease) ([place])   . Elevated lipids   . Generalized headaches   . History of pneumonia   . Hyperlipidemia   . Hypothyroidism   . MVP (mitral valve prolapse)    palpitations  . Osteopenia   . Seasonal allergies   . Sinusitis   . Tubular  adenoma of colon    Past Surgical History:  Procedure Laterality Date  . COLONOSCOPY WITH PROPOFOL N/A 01/06/2016   Procedure: COLONOSCOPY WITH PROPOFOL;  Surgeon: Doran Stabler, MD;  Location: [facility];  Service: Gastroenterology;  Laterality: N/A;  . DILATION AND CURETTAGE OF UTERUS    . THYROIDECTOMY    . TONSILLECTOMY     Current Outpatient Medications on File Prior to Visit  Medication Sig Dispense Refill  . budesonide-formoterol (SYMBICORT) 160-4.5 MCG/ACT inhaler Inhale 2 puffs into the lungs 2 (two) times daily. 1 Inhaler 3  . fluticasone (FLONASE) 50 MCG/ACT nasal spray Place 2 sprays into both nostrils daily. 16 g 2  . hydrocortisone (ANUSOL-HC) 2.5 % rectal cream Apply twice a day with rectal tip for 7 days 30 g 2  . levocetirizine (XYZAL) 5 MG tablet TAKE 1 TABLET BY MOUTH EVERY DAY IN THE EVENING 90 tablet 3  . levothyroxine (SYNTHROID, LEVOTHROID) 50 MCG tablet TAKE 1 TABLET (50 MCG TOTAL) BY MOUTH DAILY BEFORE BREAKFAST. 90 tablet 0  . mirtazapine (REMERON) 30 MG tablet TAKE 1 TABLET (30 MG TOTAL) BY MOUTH AT BEDTIME. 90 tablet 2  . Propylene Glycol-Glycerin (SOOTHE) 0.6-0.6 % SOLN Apply to eye.    . rosuvastatin (CRESTOR) 5 MG tablet Take 1 tablet (  5 mg total) by mouth daily. 90 tablet 3  . Tiotropium Bromide Monohydrate (SPIRIVA RESPIMAT) 2.5 MCG/ACT AERS Inhale 1 Inhaler into the lungs daily. 1 Inhaler 4  . tiZANidine (ZANAFLEX) 4 MG tablet TAKE 1 TABLET (4 MG TOTAL) BY MOUTH EVERY 6 (SIX) HOURS AS NEEDED FOR MUSCLE SPASMS. 30 tablet 0  . verapamil (CALAN-SR) 120 MG CR tablet TAKE 1 TABLET BY MOUTH EVERY DAY 90 tablet 3   No current facility-administered medications on file prior to visit.    Allergies  Allergen Reactions  . Meloxicam Other (See Comments)    BAD DREAMS, SAD THOUGHTS   Social History   Socioeconomic History  . Marital status: Widowed    Spouse name: Not on file  . Number of children: 0  . Years of education: Not on file  . Highest  education level: Not on file  Occupational History    Employer: [employer]  Social Needs  . Financial resource strain: Not on file  . Food insecurity:    Worry: Not on file    Inability: Not on file  . Transportation needs:    Medical: Not on file    Non-medical: Not on file  Tobacco Use  . Smoking status: Former Smoker    Last attempt to quit: 07/24/2005    Years since quitting: 12.9  . Smokeless tobacco: Never Used  Substance and Sexual Activity  . Alcohol use: Yes    Comment: rare  . Drug use: No  . Sexual activity: Not on file  Lifestyle  . Physical activity:    Days per week: Not on file    Minutes per session: Not on file  . Stress: Not on file  Relationships  . Social connections:    Talks on phone: Not on file    Gets together: Not on file    Attends religious service: Not on file    Active member of club or organization: Not on file    Attends meetings of clubs or organizations: Not on file    Relationship status: Not on file  . Intimate partner violence:    Fear of current or ex partner: Not on file    Emotionally abused: Not on file    Physically abused: Not on file    Forced sexual activity: Not on file  Other Topics Concern  . Not on file  Social History Narrative  . Not on file    Past Medical History:  Diagnosis Date  . Arthritis   . COPD (chronic obstructive pulmonary disease) ([place])   . Elevated lipids   . Generalized headaches   . History of pneumonia   . Hyperlipidemia   . Hypothyroidism   . MVP (mitral valve prolapse)    palpitations  . Osteopenia   . Seasonal allergies   . Sinusitis   . Tubular adenoma of colon    Past Surgical History:  Procedure Laterality Date  . COLONOSCOPY WITH PROPOFOL N/A 01/06/2016   Procedure: COLONOSCOPY WITH PROPOFOL;  Surgeon: Doran Stabler, MD;  Location: [facility];  Service: Gastroenterology;  Laterality: N/A;  . DILATION AND CURETTAGE OF UTERUS    . THYROIDECTOMY    .  TONSILLECTOMY     Current Outpatient Medications on File Prior to Visit  Medication Sig Dispense Refill  . budesonide-formoterol (SYMBICORT) 160-4.5 MCG/ACT inhaler Inhale 2 puffs into the lungs 2 (two) times daily. 1 Inhaler 3  . fluticasone (FLONASE) 50 MCG/ACT nasal spray Place 2 sprays into both  nostrils daily. 16 g 2  . hydrocortisone (ANUSOL-HC) 2.5 % rectal cream Apply twice a day with rectal tip for 7 days 30 g 2  . levocetirizine (XYZAL) 5 MG tablet TAKE 1 TABLET BY MOUTH EVERY DAY IN THE EVENING 90 tablet 3  . levothyroxine (SYNTHROID, LEVOTHROID) 50 MCG tablet TAKE 1 TABLET (50 MCG TOTAL) BY MOUTH DAILY BEFORE BREAKFAST. 90 tablet 0  . mirtazapine (REMERON) 30 MG tablet TAKE 1 TABLET (30 MG TOTAL) BY MOUTH AT BEDTIME. 90 tablet 2  . Propylene Glycol-Glycerin (SOOTHE) 0.6-0.6 % SOLN Apply to eye.    . rosuvastatin (CRESTOR) 5 MG tablet Take 1 tablet (5 mg total) by mouth daily. 90 tablet 3  . Tiotropium Bromide Monohydrate (SPIRIVA RESPIMAT) 2.5 MCG/ACT AERS Inhale 1 Inhaler into the lungs daily. 1 Inhaler 4  . tiZANidine (ZANAFLEX) 4 MG tablet TAKE 1 TABLET (4 MG TOTAL) BY MOUTH EVERY 6 (SIX) HOURS AS NEEDED FOR MUSCLE SPASMS. 30 tablet 0  . verapamil (CALAN-SR) 120 MG CR tablet TAKE 1 TABLET BY MOUTH EVERY DAY 90 tablet 3   No current facility-administered medications on file prior to visit.    Allergies  Allergen Reactions  . Meloxicam Other (See Comments)    BAD DREAMS, SAD THOUGHTS   Social History   Socioeconomic History  . Marital status: Widowed    Spouse name: Not on file  . Number of children: 0  . Years of education: Not on file  . Highest education level: Not on file  Occupational History    Employer: [employer]  Social Needs  . Financial resource strain: Not on file  . Food insecurity:    Worry: Not on file    Inability: Not on file  . Transportation needs:    Medical: Not on file    Non-medical: Not on file  Tobacco Use  . Smoking  status: Former Smoker    Last attempt to quit: 07/24/2005    Years since quitting: 12.9  . Smokeless tobacco: Never Used  Substance and Sexual Activity  . Alcohol use: Yes    Comment: rare  . Drug use: No  . Sexual activity: Not on file  Lifestyle  . Physical activity:    Days per week: Not on file    Minutes per session: Not on file  . Stress: Not on file  Relationships  . Social connections:    Talks on phone: Not on file    Gets together: Not on file    Attends religious service: Not on file    Active member of club or organization: Not on file    Attends meetings of clubs or organizations: Not on file    Relationship status: Not on file  . Intimate partner violence:    Fear of current or ex partner: Not on file    Emotionally abused: Not on file    Physically abused: Not on file    Forced sexual activity: Not on file  Other Topics Concern  . Not on file  Social History Narrative  . Not on file      Review of Systems  All other systems reviewed and are negative.      Objective:   Physical Exam Vitals signs reviewed.  Constitutional:      General: She is not in acute distress.    Appearance: She is well-developed. She is not diaphoretic.  Neck:     Musculoskeletal: Neck supple.     Vascular: No JVD.  Cardiovascular:  Rate and Rhythm: Normal rate and regular rhythm.     Heart sounds: Normal heart sounds.  Pulmonary:     Effort: Pulmonary effort is normal. No respiratory distress.     Breath sounds: Normal breath sounds. No stridor. No wheezing or rales.  Chest:     Chest wall: No tenderness.  Abdominal:     General: Bowel sounds are normal.     Palpations: Abdomen is soft.             Assessment & Plan:  Weight loss - Plan: US Pelvic Complete With Transvaginal, CANCELED: CBC with Differential/Platelet, CANCELED: COMPLETE METABOLIC PANEL WITH GFR, CANCELED: TSH, CANCELED: Sedimentation rate, CANCELED: Fecal Globin By Immunochemistry  Uterine  mass - Plan: US Pelvic Complete With Transvaginal  After a long discussion with the patient for more than 25 minutes, I believe the reason she was losing weight was likely depression and grief over the loss of her mother coupled with the recent losses of other family members.  At the present time the patient has no desire to take medication for depression but I do feel this was affecting her appetite.  Her weight has stabilized and therefore as long as she feels comfortable, I feel no further work-up is necessary for this.  However I am concerned about the mass that I am palpating in her lower abdomen.  I believe this is a uterine mass most likely a fibroid.  I will schedule the patient for a pelvic ultrasound to evaluate further to determine the nature of the mass and whether this needs further work-up

## 2018-06-24 DIAGNOSIS — M48061 Spinal stenosis, lumbar region without neurogenic claudication: Secondary | ICD-10-CM | POA: Diagnosis not present

## 2018-06-28 ENCOUNTER — Other Ambulatory Visit: Payer: Medicare HMO

## 2018-06-28 ENCOUNTER — Other Ambulatory Visit: Payer: Self-pay | Admitting: Family Medicine

## 2018-06-28 DIAGNOSIS — R634 Abnormal weight loss: Secondary | ICD-10-CM

## 2018-06-28 DIAGNOSIS — N858 Other specified noninflammatory disorders of uterus: Secondary | ICD-10-CM

## 2018-07-01 ENCOUNTER — Telehealth: Payer: Self-pay | Admitting: Family Medicine

## 2018-07-01 NOTE — Telephone Encounter (Signed)
Yes, should be fine.

## 2018-07-01 NOTE — Telephone Encounter (Signed)
Pt called and wanted to know if it would be alright to take xyzal with Gabapentin? Another MD put her on Gabapentin 1 tab po qhs and she just wants to make sure it is safe to take Xyzal.

## 2018-07-02 NOTE — Telephone Encounter (Signed)
Pt aware.

## 2018-07-03 ENCOUNTER — Other Ambulatory Visit: Payer: Self-pay

## 2018-07-03 ENCOUNTER — Ambulatory Visit (HOSPITAL_COMMUNITY)
Admission: RE | Admit: 2018-07-03 | Discharge: 2018-07-03 | Disposition: A | Discharge status: Home / Self Care | Payer: Medicare HMO | Source: Ambulatory Visit | Attending: Family Medicine | Admitting: Family Medicine

## 2018-07-03 DIAGNOSIS — D259 Leiomyoma of uterus, unspecified: Secondary | ICD-10-CM | POA: Diagnosis not present

## 2018-07-03 DIAGNOSIS — R634 Abnormal weight loss: Secondary | ICD-10-CM

## 2018-07-03 DIAGNOSIS — N858 Other specified noninflammatory disorders of uterus: Secondary | ICD-10-CM | POA: Diagnosis not present

## 2018-07-10 DIAGNOSIS — M545 Low back pain: Secondary | ICD-10-CM | POA: Diagnosis not present

## 2018-07-10 DIAGNOSIS — J301 Allergic rhinitis due to pollen: Secondary | ICD-10-CM | POA: Diagnosis not present

## 2018-07-10 DIAGNOSIS — J449 Chronic obstructive pulmonary disease, unspecified: Secondary | ICD-10-CM | POA: Diagnosis not present

## 2018-07-10 DIAGNOSIS — I10 Essential (primary) hypertension: Secondary | ICD-10-CM | POA: Diagnosis not present

## 2018-07-11 ENCOUNTER — Other Ambulatory Visit: Payer: Self-pay | Admitting: Family Medicine

## 2018-07-14 ENCOUNTER — Other Ambulatory Visit: Payer: Self-pay | Admitting: Interventional Cardiology

## 2018-07-15 ENCOUNTER — Encounter: Payer: Self-pay | Admitting: Family Medicine

## 2018-07-15 ENCOUNTER — Other Ambulatory Visit: Payer: Self-pay

## 2018-07-15 ENCOUNTER — Ambulatory Visit (INDEPENDENT_AMBULATORY_CARE_PROVIDER_SITE_OTHER): Payer: Medicare HMO | Admitting: Family Medicine

## 2018-07-15 VITALS — BP 128/72 | HR 70 | Temp 99.2°F | Resp 14 | Ht 65.5 in | Wt 149.0 lb

## 2018-07-15 DIAGNOSIS — M7581 Other shoulder lesions, right shoulder: Secondary | ICD-10-CM

## 2018-07-15 NOTE — Progress Notes (Signed)
Subjective:    Patient ID: Mary Blevins, female    DOB: 11/23/1945, 73 y.o.   MRN: [id number]  HPI Patient reports pain in her right shoulder now for more than a week.  It hurts to abduct her arm greater than 90 degrees.  It hurts with internal and external rotation.  She has significant pain with empty can testing and with drop testing however she has normal strength and is able to resist downward force with abduction well.  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 Arthritis   . COPD (chronic obstructive pulmonary disease) ([place])   . Elevated lipids   . Generalized headaches   . History of pneumonia   . Hyperlipidemia   . Hypothyroidism   . MVP (mitral valve prolapse)    palpitations  . Osteopenia   . Seasonal allergies   . Sinusitis   . Tubular adenoma of colon    Past Surgical History:  Procedure Laterality Date  . COLONOSCOPY WITH PROPOFOL N/A 01/06/2016   Procedure: COLONOSCOPY WITH PROPOFOL;  Surgeon: Doran Stabler, MD;  Location: [facility];  Service: Gastroenterology;  Laterality: N/A;  . DILATION AND CURETTAGE OF UTERUS    . THYROIDECTOMY    . TONSILLECTOMY     Current Outpatient Medications on File Prior to Visit  Medication Sig Dispense Refill  . budesonide-formoterol (SYMBICORT) 160-4.5 MCG/ACT inhaler Inhale 2 puffs into the lungs 2 (two) times daily. 1 Inhaler 3  . fluticasone (FLONASE) 50 MCG/ACT nasal spray SPRAY 2 SPRAYS INTO EACH NOSTRIL EVERY DAY 16 g 2  . gabapentin (NEURONTIN) 300 MG capsule Take 300 mg by mouth at bedtime.    . hydrocortisone (ANUSOL-HC) 2.5 % rectal cream Apply twice a day with rectal tip for 7 days 30 g 2  . levocetirizine (XYZAL) 5 MG tablet TAKE 1 TABLET BY MOUTH EVERY DAY IN THE EVENING 90 tablet 3  . levothyroxine (SYNTHROID, LEVOTHROID) 50 MCG tablet TAKE 1 TABLET (50 MCG TOTAL) BY MOUTH DAILY BEFORE  BREAKFAST. 90 tablet 0  . Propylene Glycol-Glycerin (SOOTHE) 0.6-0.6 % SOLN Apply to eye.    . rosuvastatin (CRESTOR) 5 MG tablet Take 1 tablet (5 mg total) by mouth daily. 90 tablet 3  . Tiotropium Bromide Monohydrate (SPIRIVA RESPIMAT) 2.5 MCG/ACT AERS Inhale 1 Inhaler into the lungs daily. 1 Inhaler 4  . tiZANidine (ZANAFLEX) 4 MG tablet TAKE 1 TABLET (4 MG TOTAL) BY MOUTH EVERY 6 (SIX) HOURS AS NEEDED FOR MUSCLE SPASMS. 30 tablet 0  . verapamil (CALAN-SR) 120 MG CR tablet TAKE 1 TABLET BY MOUTH EVERY DAY 90 tablet 3   No current facility-administered medications on file prior to visit.    Allergies  Allergen Reactions  . Meloxicam Other (See Comments)    BAD DREAMS, SAD THOUGHTS   Social History   Socioeconomic History  . Marital status: Widowed    Spouse name: Not on file  . Number of children: 0  . Years of education: Not on file  . Highest education level: Not on file  Occupational History    Employer: [employer]  Social Needs  . Financial resource strain: Not on file  . Food insecurity    Worry: Not on file    Inability: Not on file  . Transportation needs    Medical: Not on file    Non-medical: Not on file  Tobacco Use  . Smoking status: Former Smoker    Quit date: 07/24/2005    Years since quitting: 12.9  . Smokeless tobacco: Never Used  Substance and Sexual Activity  . Alcohol use: Yes    Comment: rare  . Drug use: No  . Sexual activity: Not on file  Lifestyle  . Physical activity    Days per week: Not on file    Minutes per session: Not on file  . Stress: Not on file  Relationships  . Social Herbalist on phone: Not on file    Gets together: Not on file    Attends religious service: Not on file    Active member of club or organization: Not on file    Attends meetings of clubs or organizations: Not on file    Relationship status: Not on file  . Intimate partner violence    Fear of current or ex partner: Not on file     Emotionally abused: Not on file    Physically abused: Not on file    Forced sexual activity: Not on file  Other Topics Concern  . Not on file  Social History Narrative  . Not on file    Past Medical History:  Diagnosis Date  . Arthritis   . COPD (chronic obstructive pulmonary disease) ([place])   . Elevated lipids   . Generalized headaches   . History of pneumonia   . Hyperlipidemia   . Hypothyroidism   . MVP (mitral valve prolapse)    palpitations  . Osteopenia   . Seasonal allergies   . Sinusitis   . Tubular adenoma of colon    Past Surgical History:  Procedure Laterality Date  . COLONOSCOPY WITH PROPOFOL N/A 01/06/2016   Procedure: COLONOSCOPY WITH PROPOFOL;  Surgeon: Doran Stabler, MD;  Location: [facility];  Service: Gastroenterology;  Laterality: N/A;  . DILATION AND CURETTAGE OF UTERUS    . THYROIDECTOMY    . TONSILLECTOMY     Current Outpatient Medications on File Prior to Visit  Medication Sig Dispense Refill  . budesonide-formoterol (SYMBICORT) 160-4.5 MCG/ACT inhaler Inhale 2 puffs into the lungs 2 (two) times daily. 1 Inhaler 3  . fluticasone (FLONASE) 50 MCG/ACT nasal spray SPRAY 2 SPRAYS INTO EACH NOSTRIL EVERY DAY 16 g 2  . gabapentin (NEURONTIN) 300 MG capsule Take 300 mg by mouth at bedtime.    . hydrocortisone (ANUSOL-HC) 2.5 % rectal cream Apply twice a day with rectal tip for 7 days 30 g 2  . levocetirizine (XYZAL) 5 MG tablet TAKE 1 TABLET BY MOUTH EVERY DAY IN THE EVENING 90 tablet 3  . levothyroxine (SYNTHROID, LEVOTHROID) 50 MCG tablet TAKE 1 TABLET (50 MCG TOTAL) BY MOUTH DAILY BEFORE BREAKFAST. 90 tablet 0  . Propylene Glycol-Glycerin (SOOTHE) 0.6-0.6 % SOLN Apply to eye.    . rosuvastatin (CRESTOR) 5 MG tablet Take 1 tablet (5 mg total) by mouth daily. 90 tablet 3  . Tiotropium Bromide Monohydrate (SPIRIVA RESPIMAT) 2.5 MCG/ACT AERS Inhale 1 Inhaler into the lungs daily. 1 Inhaler 4  . tiZANidine (ZANAFLEX) 4 MG tablet TAKE 1 TABLET (4 MG  TOTAL) BY MOUTH EVERY 6 (SIX) HOURS AS NEEDED FOR MUSCLE SPASMS. 30 tablet 0  . verapamil (CALAN-SR) 120 MG CR tablet TAKE 1 TABLET BY MOUTH EVERY DAY 90 tablet 3   No current facility-administered medications on file prior to visit.    Allergies  Allergen Reactions  . Meloxicam Other (See Comments)  BAD DREAMS, SAD THOUGHTS   Social History   Socioeconomic History  . Marital status: Widowed    Spouse name: Not on file  . Number of children: 0  . Years of education: Not on file  . Highest education level: Not on file  Occupational History    Employer: [employer]  Social Needs  . Financial resource strain: Not on file  . Food insecurity    Worry: Not on file    Inability: Not on file  . Transportation needs    Medical: Not on file    Non-medical: Not on file  Tobacco Use  . Smoking status: Former Smoker    Quit date: 07/24/2005    Years since quitting: 12.9  . Smokeless tobacco: Never Used  Substance and Sexual Activity  . Alcohol use: Yes    Comment: rare  . Drug use: No  . Sexual activity: Not on file  Lifestyle  . Physical activity    Days per week: Not on file    Minutes per session: Not on file  . Stress: Not on file  Relationships  . Social Herbalist on phone: Not on file    Gets together: Not on file    Attends religious service: Not on file    Active member of club or organization: Not on file    Attends meetings of clubs or organizations: Not on file    Relationship status: Not on file  . Intimate partner violence    Fear of current or ex partner: Not on file    Emotionally abused: Not on file    Physically abused: Not on file    Forced sexual activity: Not on file  Other Topics Concern  . Not on file  Social History Narrative  . Not on file      Review of Systems  All other systems reviewed and are negative.      Objective:   Physical Exam Vitals signs reviewed.  Constitutional:      General: She is not in  acute distress.    Appearance: She is well-developed. She is not diaphoretic.  Neck:     Musculoskeletal: Neck supple.     Vascular: No JVD.  Cardiovascular:     Rate and Rhythm: Normal rate and regular rhythm.     Heart sounds: Normal heart sounds.  Pulmonary:     Effort: Pulmonary effort is normal. No respiratory distress.     Breath sounds: Normal breath sounds. No stridor. No wheezing or rales.  Chest:     Chest wall: No tenderness.  Musculoskeletal:     Right shoulder: She exhibits decreased range of motion, tenderness and pain. She exhibits no bony tenderness, no swelling, no effusion and normal strength.           Assessment & Plan:  The encounter diagnosis was Rotator cuff tendonitis, right. I believe the patient has supraspinatus tendinitis.  She is tried and failed NSAIDs.  We discussed her options and she would like to try cortisone shot in the right shoulder.  Using sterile technique from a posterior approach, I injected the right subacromial space with 2 cc lidocaine, 2 cc of Marcaine, and 2 cc of 40 mg/mL Kenalog.  The patient tolerated the procedure well without complication.  Reassess if no better in 1 week or sooner if worse

## 2018-07-21 DIAGNOSIS — J449 Chronic obstructive pulmonary disease, unspecified: Secondary | ICD-10-CM | POA: Diagnosis not present

## 2018-07-21 DIAGNOSIS — J9611 Chronic respiratory failure with hypoxia: Secondary | ICD-10-CM | POA: Diagnosis not present

## 2018-08-20 DIAGNOSIS — J9611 Chronic respiratory failure with hypoxia: Secondary | ICD-10-CM | POA: Diagnosis not present

## 2018-08-20 DIAGNOSIS — J449 Chronic obstructive pulmonary disease, unspecified: Secondary | ICD-10-CM | POA: Diagnosis not present

## 2018-08-21 DIAGNOSIS — H04123 Dry eye syndrome of bilateral lacrimal glands: Secondary | ICD-10-CM | POA: Diagnosis not present

## 2018-08-21 DIAGNOSIS — H52203 Unspecified astigmatism, bilateral: Secondary | ICD-10-CM | POA: Diagnosis not present

## 2018-08-21 DIAGNOSIS — H2513 Age-related nuclear cataract, bilateral: Secondary | ICD-10-CM | POA: Diagnosis not present

## 2018-08-21 DIAGNOSIS — H40013 Open angle with borderline findings, low risk, bilateral: Secondary | ICD-10-CM | POA: Diagnosis not present

## 2018-08-21 DIAGNOSIS — H524 Presbyopia: Secondary | ICD-10-CM | POA: Diagnosis not present

## 2018-08-21 DIAGNOSIS — H5203 Hypermetropia, bilateral: Secondary | ICD-10-CM | POA: Diagnosis not present

## 2018-08-28 ENCOUNTER — Telehealth: Payer: Self-pay | Admitting: Family Medicine

## 2018-08-28 NOTE — Telephone Encounter (Signed)
Pt needs 1 refill on spiriva to [facility]

## 2018-08-29 ENCOUNTER — Telehealth: Payer: Self-pay | Admitting: Family Medicine

## 2018-08-29 MED ORDER — SPIRIVA RESPIMAT 2.5 MCG/ACT IN AERS: 1.0000 | INHALATION_SPRAY | Freq: Every day | RESPIRATORY_TRACT | 3 refills | Status: DC

## 2018-08-29 MED ORDER — BUDESONIDE-FORMOTEROL FUMARATE 160-4.5 MCG/ACT IN AERO: 2.0000 | INHALATION_SPRAY | Freq: Two times a day (BID) | RESPIRATORY_TRACT | 3 refills | Status: DC

## 2018-08-29 NOTE — Telephone Encounter (Signed)
[facility]  Patient needs refill on symbicort

## 2018-08-29 NOTE — Telephone Encounter (Signed)
Prescription sent to pharmacy.

## 2018-08-29 NOTE — Telephone Encounter (Signed)
Medication called/sent to requested pharmacy  

## 2018-09-13 DIAGNOSIS — Z1231 Encounter for screening mammogram for malignant neoplasm of breast: Secondary | ICD-10-CM | POA: Diagnosis not present

## 2018-09-13 DIAGNOSIS — Z803 Family history of malignant neoplasm of breast: Secondary | ICD-10-CM | POA: Diagnosis not present

## 2018-09-20 DIAGNOSIS — J9611 Chronic respiratory failure with hypoxia: Secondary | ICD-10-CM | POA: Diagnosis not present

## 2018-09-20 DIAGNOSIS — J449 Chronic obstructive pulmonary disease, unspecified: Secondary | ICD-10-CM | POA: Diagnosis not present

## 2018-10-01 ENCOUNTER — Other Ambulatory Visit: Payer: Self-pay

## 2018-10-01 DIAGNOSIS — Z20822 Contact with and (suspected) exposure to covid-19: Secondary | ICD-10-CM

## 2018-10-01 DIAGNOSIS — R6889 Other general symptoms and signs: Secondary | ICD-10-CM | POA: Diagnosis not present

## 2018-10-02 LAB — NOVEL CORONAVIRUS, NAA: SARS-CoV-2, NAA: NOT DETECTED

## 2018-10-04 ENCOUNTER — Telehealth: Payer: Self-pay | Admitting: Family Medicine

## 2018-10-04 NOTE — Telephone Encounter (Signed)
Recommended coricidin HBP as pt is already taking xyzal. Pt aware via vm

## 2018-10-04 NOTE — Telephone Encounter (Signed)
Pt has made an apt with you for Tuesday to discuss some medical issues she is having however she states that her bronchitis is kicking in as it does every year about this time. She is taking mucinex and would like to know if there is something else she can take maybe otc?

## 2018-10-04 NOTE — Telephone Encounter (Signed)
Take her symbicort and add xyzal 5 mg poqday

## 2018-10-08 ENCOUNTER — Encounter: Payer: Self-pay | Admitting: Family Medicine

## 2018-10-08 ENCOUNTER — Ambulatory Visit (INDEPENDENT_AMBULATORY_CARE_PROVIDER_SITE_OTHER): Payer: Medicare HMO | Admitting: Family Medicine

## 2018-10-08 ENCOUNTER — Other Ambulatory Visit: Payer: Self-pay

## 2018-10-08 VITALS — BP 134/80 | HR 68 | Temp 98.9°F | Resp 16 | Ht 65.5 in | Wt 148.0 lb

## 2018-10-08 DIAGNOSIS — M545 Low back pain: Secondary | ICD-10-CM

## 2018-10-08 DIAGNOSIS — M48061 Spinal stenosis, lumbar region without neurogenic claudication: Secondary | ICD-10-CM | POA: Diagnosis not present

## 2018-10-08 DIAGNOSIS — Z23 Encounter for immunization: Secondary | ICD-10-CM

## 2018-10-08 DIAGNOSIS — R634 Abnormal weight loss: Secondary | ICD-10-CM

## 2018-10-08 DIAGNOSIS — F4321 Adjustment disorder with depressed mood: Secondary | ICD-10-CM | POA: Diagnosis not present

## 2018-10-08 DIAGNOSIS — G8929 Other chronic pain: Secondary | ICD-10-CM | POA: Diagnosis not present

## 2018-10-08 MED ORDER — HYDROCODONE-ACETAMINOPHEN 5-325 MG PO TABS: 1.0000 | ORAL_TABLET | Freq: Four times a day (QID) | ORAL | 0 refills | Status: DC | PRN

## 2018-10-08 MED ORDER — FLUOXETINE HCL 20 MG PO TABS: 20.0000 mg | ORAL_TABLET | Freq: Every day | ORAL | 3 refills | Status: DC

## 2018-10-08 NOTE — Progress Notes (Signed)
Subjective:    Patient ID: Mary Blevins, female    DOB: 1945/12/15, 73 y.o.   MRN: [id number]  HPI Wt Readings from Last 3 Encounters:  10/08/18 148 lb (67.1 kg)  07/15/18 149 lb (67.6 kg)  06/20/18 149 lb (67.6 kg)   I am seeing the patient today for weight loss.  Since I last saw the patient in April, her weight has maintained stable at 149 pounds.  She took 1 dose of Remeron but discontinued the medication after 1 dose because it caused her to feel too sleepy the next day.  She reports feeling sad and lonely.  She has lost several family members including her husband, her mother, and several other members of her family over the last few years.  This leaves her feeling alone and sad at times.  She admits that she constantly thinks about them and even dreams about them.  She does feel depressed at times however this is mild and she feels that she does not require medication for it.  She still receives joy from life.  She denies any suicidal thoughts or panic attacks although she does occasionally have anxiety.  I explained to the patient that I feel this is most likely reason why her weight was dropping.  I believe she was grieving the loss of her mother and this affected her appetite.  This seems to be getting better just over the passage of time.  However today on her exam, there is a palpable mass in the suprapubic area that I suspect could be a uterine fibroid however I have not appreciated this before on exam.  It is firm.  It is roughly the diameter of a tennis ball.  However it has vague margins and is difficult to ascertain size completely.  At that time, my plan was: After a long discussion with the patient for more than 25 minutes, I believe the reason she was losing weight was likely depression and grief over the loss of her mother coupled with the recent losses of other family members.  At the present time the patient has no desire to take medication for depression but I do feel this  was affecting her appetite.  Her weight has stabilized and therefore as long as she feels comfortable, I feel no further work-up is necessary for this.  However I am concerned about the mass that I am palpating in her lower abdomen.  I believe this is a uterine mass most likely a fibroid.  I will schedule the patient for a pelvic ultrasound to evaluate further to determine the nature of the mass and whether this needs further work-up  10/08/18 Since I last saw the patient, she has lost 1 pound.  However she continues to state that she is losing weight.  She states that she has no appetite.  She still has frequent crying spells grieving for the loss of her mother who died 1 year ago.  She does have some depression and anhedonia.  She has to force herself to eat.  She also states that when she eats, she can become choked easily.  She denies any food sticking in her throat.  She denies any aspiration.  She does have a history of an EGD requiring dilatation in 2013 however she denies any solid food sticking in her throat or trouble swallowing liquids.  She does report postnasal drip and drainage and an occasional cough however she is not wheezing today on exam.  She denies any  shortness of breath.  She denies any chest pain.  She also complains of some mild aching low back pain.  This is been thoroughly evaluated in the past.  She is tried cortisone injections under the care of orthopedist without improvement.  She is tried physical therapy without improvement.  She is tried muscle relaxers without improvement.  She tried meloxicam without improvement.  She would like hydrocodone that she could use sparingly as needed for severe pain.  She believes that 30 tablets would last for more than 2 months. Past Medical History:  Diagnosis Date   Arthritis    COPD (chronic obstructive pulmonary disease) ([facility])    Elevated lipids    Generalized headaches    History of pneumonia    Hyperlipidemia    Hypothyroidism     MVP (mitral valve prolapse)    palpitations   Osteopenia    Seasonal allergies    Sinusitis    Tubular adenoma of colon    Past Surgical History:  Procedure Laterality Date   COLONOSCOPY WITH PROPOFOL N/A 01/06/2016   Procedure: COLONOSCOPY WITH PROPOFOL;  Surgeon: Doran Stabler, MD;  Location: [facility];  Service: Gastroenterology;  Laterality: N/A;   DILATION AND CURETTAGE OF UTERUS     THYROIDECTOMY     TONSILLECTOMY     Current Outpatient Medications on File Prior to Visit  Medication Sig Dispense Refill   budesonide-formoterol (SYMBICORT) 160-4.5 MCG/ACT inhaler Inhale 2 puffs into the lungs 2 (two) times daily. 1 Inhaler 3   fluticasone (FLONASE) 50 MCG/ACT nasal spray SPRAY 2 SPRAYS INTO EACH NOSTRIL EVERY DAY 16 g 2   gabapentin (NEURONTIN) 300 MG capsule Take 300 mg by mouth at bedtime.     levocetirizine (XYZAL) 5 MG tablet TAKE 1 TABLET BY MOUTH EVERY DAY IN THE EVENING 90 tablet 3   levothyroxine (SYNTHROID, LEVOTHROID) 50 MCG tablet TAKE 1 TABLET (50 MCG TOTAL) BY MOUTH DAILY BEFORE BREAKFAST. 90 tablet 0   Propylene Glycol-Glycerin (SOOTHE) 0.6-0.6 % SOLN Apply to eye.     rosuvastatin (CRESTOR) 5 MG tablet TAKE 1 TABLET BY MOUTH EVERY DAY 90 tablet 3   Tiotropium Bromide Monohydrate (SPIRIVA RESPIMAT) 2.5 MCG/ACT AERS Inhale 1 Inhaler into the lungs daily. 1 g 3   tiZANidine (ZANAFLEX) 4 MG tablet TAKE 1 TABLET (4 MG TOTAL) BY MOUTH EVERY 6 (SIX) HOURS AS NEEDED FOR MUSCLE SPASMS. 30 tablet 0   verapamil (CALAN-SR) 120 MG CR tablet TAKE 1 TABLET BY MOUTH EVERY DAY 90 tablet 3   No current facility-administered medications on file prior to visit.    Allergies  Allergen Reactions   Meloxicam Other (See Comments)    BAD DREAMS, SAD THOUGHTS   Social History   Socioeconomic History   Marital status: Widowed    Spouse name: Not on file   Number of children: 0   Years of education: Not on file   Highest education level: Not on file   Occupational History    Employer: [employer]  Social Needs   Financial resource strain: Not on file   Food insecurity    Worry: Not on file    Inability: Not on file   Transportation needs    Medical: Not on file    Non-medical: Not on file  Tobacco Use   Smoking status: Former Smoker    Quit date: 07/24/2005    Years since quitting: 13.2   Smokeless tobacco: Never Used  Substance and Sexual Activity   Alcohol use:  Yes    Comment: rare   Drug use: No   Sexual activity: Not on file  Lifestyle   Physical activity    Days per week: Not on file    Minutes per session: Not on file   Stress: Not on file  Relationships   Social connections    Talks on phone: Not on file    Gets together: Not on file    Attends religious service: Not on file    Active member of club or organization: Not on file    Attends meetings of clubs or organizations: Not on file    Relationship status: Not on file   Intimate partner violence    Fear of current or ex partner: Not on file    Emotionally abused: Not on file    Physically abused: Not on file    Forced sexual activity: Not on file  Other Topics Concern   Not on file  Social History Narrative   Not on file    Past Medical History:  Diagnosis Date   Arthritis    COPD (chronic obstructive pulmonary disease) ([place])    Elevated lipids    Generalized headaches    History of pneumonia    Hyperlipidemia    Hypothyroidism    MVP (mitral valve prolapse)    palpitations   Osteopenia    Seasonal allergies    Sinusitis    Tubular adenoma of colon    Past Surgical History:  Procedure Laterality Date   COLONOSCOPY WITH PROPOFOL N/A 01/06/2016   Procedure: COLONOSCOPY WITH PROPOFOL;  Surgeon: Doran Stabler, MD;  Location: [facility];  Service: Gastroenterology;  Laterality: N/A;   DILATION AND CURETTAGE OF UTERUS     THYROIDECTOMY     TONSILLECTOMY     Current Outpatient Medications on  File Prior to Visit  Medication Sig Dispense Refill   budesonide-formoterol (SYMBICORT) 160-4.5 MCG/ACT inhaler Inhale 2 puffs into the lungs 2 (two) times daily. 1 Inhaler 3   fluticasone (FLONASE) 50 MCG/ACT nasal spray SPRAY 2 SPRAYS INTO EACH NOSTRIL EVERY DAY 16 g 2   gabapentin (NEURONTIN) 300 MG capsule Take 300 mg by mouth at bedtime.     levocetirizine (XYZAL) 5 MG tablet TAKE 1 TABLET BY MOUTH EVERY DAY IN THE EVENING 90 tablet 3   levothyroxine (SYNTHROID, LEVOTHROID) 50 MCG tablet TAKE 1 TABLET (50 MCG TOTAL) BY MOUTH DAILY BEFORE BREAKFAST. 90 tablet 0   Propylene Glycol-Glycerin (SOOTHE) 0.6-0.6 % SOLN Apply to eye.     rosuvastatin (CRESTOR) 5 MG tablet TAKE 1 TABLET BY MOUTH EVERY DAY 90 tablet 3   Tiotropium Bromide Monohydrate (SPIRIVA RESPIMAT) 2.5 MCG/ACT AERS Inhale 1 Inhaler into the lungs daily. 1 g 3   tiZANidine (ZANAFLEX) 4 MG tablet TAKE 1 TABLET (4 MG TOTAL) BY MOUTH EVERY 6 (SIX) HOURS AS NEEDED FOR MUSCLE SPASMS. 30 tablet 0   verapamil (CALAN-SR) 120 MG CR tablet TAKE 1 TABLET BY MOUTH EVERY DAY 90 tablet 3   No current facility-administered medications on file prior to visit.    Allergies  Allergen Reactions   Meloxicam Other (See Comments)    BAD DREAMS, SAD THOUGHTS   Social History   Socioeconomic History   Marital status: Widowed    Spouse name: Not on file   Number of children: 0   Years of education: Not on file   Highest education level: Not on file  Occupational History    Employer: [employer]  Needs   Financial resource strain: Not on file   Food insecurity    Worry: Not on file    Inability: Not on file   Transportation needs    Medical: Not on file    Non-medical: Not on file  Tobacco Use   Smoking status: Former Smoker    Quit date: 07/24/2005    Years since quitting: 13.2   Smokeless tobacco: Never Used  Substance and Sexual Activity   Alcohol use: Yes    Comment: rare   Drug use: No     Sexual activity: Not on file  Lifestyle   Physical activity    Days per week: Not on file    Minutes per session: Not on file   Stress: Not on file  Relationships   Social connections    Talks on phone: Not on file    Gets together: Not on file    Attends religious service: Not on file    Active member of club or organization: Not on file    Attends meetings of clubs or organizations: Not on file    Relationship status: Not on file   Intimate partner violence    Fear of current or ex partner: Not on file    Emotionally abused: Not on file    Physically abused: Not on file    Forced sexual activity: Not on file  Other Topics Concern   Not on file  Social History Narrative   Not on file      Review of Systems  All other systems reviewed and are negative.      Objective:   Physical Exam Vitals signs reviewed.  Constitutional:      General: She is not in acute distress.    Appearance: She is well-developed. She is not diaphoretic.  Neck:     Musculoskeletal: Neck supple.     Vascular: No JVD.  Cardiovascular:     Rate and Rhythm: Normal rate and regular rhythm.     Heart sounds: Normal heart sounds.  Pulmonary:     Effort: Pulmonary effort is normal. No respiratory distress.     Breath sounds: Normal breath sounds. No stridor. No wheezing or rales.  Chest:     Chest wall: No tenderness.  Abdominal:     General: Bowel sounds are normal.     Palpations: Abdomen is soft.           Assessment & Plan:  Weight loss  Need for immunization against influenza - Plan: Flu Vaccine QUAD High Dose(Fluad)  Chronic midline low back pain without sciatica  Spinal stenosis at L4-L5 level  Grief reaction  Patient's weight is down 4 pounds since January.  Based on this I do not feel that there is a significant underlying physical problem.  Given her depression and abnormal grief I believe this is most likely affecting her appetite causing her to lose weight.   Therefore I recommended trying Prozac 20 mg a day and then reassessing in 4 weeks to see how the patient is doing.  Patient denies any food becoming lodged or stuck in her throat when she swallows so therefore I do not feel that she needs an EGD or dilatation at the present time however if symptoms worsen this would be the next step.  There is no evidence of bronchitis today on her exam.  I believe her cough is likely related to allergies and postnasal drip and this can be treated with an antihistamine over-the-counter.  Given  her chronic low back pain secondary to spinal stenosis refractory to epidural steroid injections I will give the patient hydrocodone 5/325 1 p.o. every 6 hours as needed pain.  I will give her 30 tablets and this should last 1 to 2 months.  We will refill this on an as-needed basis so that we can monitor usage.

## 2018-10-10 DIAGNOSIS — I10 Essential (primary) hypertension: Secondary | ICD-10-CM | POA: Diagnosis not present

## 2018-10-10 DIAGNOSIS — J9611 Chronic respiratory failure with hypoxia: Secondary | ICD-10-CM | POA: Diagnosis not present

## 2018-10-10 DIAGNOSIS — J301 Allergic rhinitis due to pollen: Secondary | ICD-10-CM | POA: Diagnosis not present

## 2018-10-10 DIAGNOSIS — J438 Other emphysema: Secondary | ICD-10-CM | POA: Diagnosis not present

## 2018-10-18 ENCOUNTER — Telehealth: Payer: Self-pay | Admitting: Family Medicine

## 2018-10-18 NOTE — Telephone Encounter (Signed)
Pt called and wanted to know if the new medication could cause her to be jittery at times? She states that it is working well for her.

## 2018-10-18 NOTE — Telephone Encounter (Signed)
Perhaps a little but this should improve obver the next few weeks.

## 2018-10-18 NOTE — Telephone Encounter (Signed)
Patient has a question on her medication. She has left a vm.  CB# [phone]

## 2018-10-21 DIAGNOSIS — J9611 Chronic respiratory failure with hypoxia: Secondary | ICD-10-CM | POA: Diagnosis not present

## 2018-10-21 DIAGNOSIS — J449 Chronic obstructive pulmonary disease, unspecified: Secondary | ICD-10-CM | POA: Diagnosis not present

## 2018-10-21 NOTE — Telephone Encounter (Signed)
Pt aware.

## 2018-10-26 ENCOUNTER — Other Ambulatory Visit: Payer: Self-pay | Admitting: Family Medicine

## 2018-11-08 ENCOUNTER — Telehealth: Payer: Self-pay | Admitting: Family Medicine

## 2018-11-08 NOTE — Telephone Encounter (Signed)
Sounds like a biceps strain.  I would recommend ibuprofen 400 mg q 8 hrs and warm compresses.

## 2018-11-08 NOTE — Telephone Encounter (Signed)
Patient walked in to the office with c/o right arm pain that is radiating from her forearm into her upper arm. Onset of pain was Wednesday. States that she did carry some groceries but she is unsure if this may have caused her pain. States she has only tried warm compresses. There is no swelling or redness to arm but patient is unable to fully extend arm without grimacing in pain. Patient would like to know what you recommend. She is scheduled for an appointment on Monday. Please advise?

## 2018-11-08 NOTE — Telephone Encounter (Signed)
Left message return call

## 2018-11-11 ENCOUNTER — Encounter: Payer: Self-pay | Admitting: Family Medicine

## 2018-11-11 ENCOUNTER — Ambulatory Visit (INDEPENDENT_AMBULATORY_CARE_PROVIDER_SITE_OTHER): Payer: Medicare HMO | Admitting: Family Medicine

## 2018-11-11 ENCOUNTER — Other Ambulatory Visit: Payer: Self-pay

## 2018-11-11 VITALS — BP 122/70 | HR 76 | Temp 98.7°F | Resp 16 | Ht 65.5 in | Wt 142.0 lb

## 2018-11-11 DIAGNOSIS — M7521 Bicipital tendinitis, right shoulder: Secondary | ICD-10-CM | POA: Diagnosis not present

## 2018-11-11 MED ORDER — HYDROCODONE-ACETAMINOPHEN 5-325 MG PO TABS: 1.0000 | ORAL_TABLET | Freq: Four times a day (QID) | ORAL | 0 refills | Status: DC | PRN

## 2018-11-11 NOTE — Progress Notes (Signed)
Subjective:    Patient ID: Mary Blevins, female    DOB: 1945/04/10, 73 y.o.   MRN: [id number]  HPI  Patient was carrying groceries into her house last Wednesday.  At the time she did not experience any pain but shortly thereafter she developed pain in her right elbow at the insertion of the biceps tendon.  Now flexing her elbow against resistance elicits pain in the distal medial portion of the biceps and at the insertion of the biceps tendon in the medial forearm.  She is tender to palpation in that area there.  There is no palpable deformity in the biceps itself.  She has normal flexion and extension of the elbow and normal abduction and rotation of the shoulder.  Movement of the shoulder does not elicit pain.  She denies any neuropathic pain radiating down her arm.  Exacerbating factors include flexing her elbow against resistance.  She has taken Aleve for 2 days and the pain is some better. Past Medical History:  Diagnosis Date  . Arthritis   . COPD (chronic obstructive pulmonary disease) ([place])   . Elevated lipids   . Generalized headaches   . History of pneumonia   . Hyperlipidemia   . Hypothyroidism   . MVP (mitral valve prolapse)    palpitations  . Osteopenia   . Seasonal allergies   . Sinusitis   . Tubular adenoma of colon    Past Surgical History:  Procedure Laterality Date  . COLONOSCOPY WITH PROPOFOL N/A 01/06/2016   Procedure: COLONOSCOPY WITH PROPOFOL;  Surgeon: Doran Stabler, MD;  Location: [facility];  Service: Gastroenterology;  Laterality: N/A;  . DILATION AND CURETTAGE OF UTERUS    . THYROIDECTOMY    . TONSILLECTOMY     Current Outpatient Medications on File Prior to Visit  Medication Sig Dispense Refill  . budesonide-formoterol (SYMBICORT) 160-4.5 MCG/ACT inhaler Inhale 2 puffs into the lungs 2 (two) times daily. 1 Inhaler 3  . FLUoxetine (PROZAC) 20 MG tablet Take 1 tablet (20 mg total) by mouth daily. 30 tablet 3  . fluticasone (FLONASE) 50  MCG/ACT nasal spray SPRAY 2 SPRAYS INTO EACH NOSTRIL EVERY DAY 16 g 2  . gabapentin (NEURONTIN) 300 MG capsule Take 300 mg by mouth at bedtime.    Marland Kitchen HYDROcodone-acetaminophen (NORCO) 5-325 MG tablet Take 1 tablet by mouth every 6 (six) hours as needed for moderate pain. 30 tablet 0  . levocetirizine (XYZAL) 5 MG tablet TAKE 1 TABLET BY MOUTH EVERY DAY IN THE EVENING 90 tablet 3  . levothyroxine (SYNTHROID) 50 MCG tablet TAKE 1 TABLET BY MOUTH DAILY BEFORE BREAKFAST 90 tablet 3  . Propylene Glycol-Glycerin (SOOTHE) 0.6-0.6 % SOLN Apply to eye.    . rosuvastatin (CRESTOR) 5 MG tablet TAKE 1 TABLET BY MOUTH EVERY DAY 90 tablet 3  . Tiotropium Bromide Monohydrate (SPIRIVA RESPIMAT) 2.5 MCG/ACT AERS Inhale 1 Inhaler into the lungs daily. 1 g 3  . tiZANidine (ZANAFLEX) 4 MG tablet TAKE 1 TABLET (4 MG TOTAL) BY MOUTH EVERY 6 (SIX) HOURS AS NEEDED FOR MUSCLE SPASMS. 30 tablet 0  . verapamil (CALAN-SR) 120 MG CR tablet TAKE 1 TABLET BY MOUTH EVERY DAY 90 tablet 3   No current facility-administered medications on file prior to visit.    Allergies  Allergen Reactions  . Meloxicam Other (See Comments)    BAD DREAMS, SAD THOUGHTS   Social History   Socioeconomic History  . Marital status: Widowed    Spouse name: Not on file  .  Number of children: 0  . Years of education: Not on file  . Highest education level: Not on file  Occupational History    Employer: [employer]  Social Needs  . Financial resource strain: Not on file  . Food insecurity    Worry: Not on file    Inability: Not on file  . Transportation needs    Medical: Not on file    Non-medical: Not on file  Tobacco Use  . Smoking status: Former Smoker    Quit date: 07/24/2005    Years since quitting: 13.3  . Smokeless tobacco: Never Used  Substance and Sexual Activity  . Alcohol use: Yes    Comment: rare  . Drug use: No  . Sexual activity: Not on file  Lifestyle  . Physical activity    Days per week: Not on file     Minutes per session: Not on file  . Stress: Not on file  Relationships  . Social Herbalist on phone: Not on file    Gets together: Not on file    Attends religious service: Not on file    Active member of club or organization: Not on file    Attends meetings of clubs or organizations: Not on file    Relationship status: Not on file  . Intimate partner violence    Fear of current or ex partner: Not on file    Emotionally abused: Not on file    Physically abused: Not on file    Forced sexual activity: Not on file  Other Topics Concern  . Not on file  Social History Narrative  . Not on file    Past Medical History:  Diagnosis Date  . Arthritis   . COPD (chronic obstructive pulmonary disease) ([place])   . Elevated lipids   . Generalized headaches   . History of pneumonia   . Hyperlipidemia   . Hypothyroidism   . MVP (mitral valve prolapse)    palpitations  . Osteopenia   . Seasonal allergies   . Sinusitis   . Tubular adenoma of colon    Past Surgical History:  Procedure Laterality Date  . COLONOSCOPY WITH PROPOFOL N/A 01/06/2016   Procedure: COLONOSCOPY WITH PROPOFOL;  Surgeon: Doran Stabler, MD;  Location: [facility];  Service: Gastroenterology;  Laterality: N/A;  . DILATION AND CURETTAGE OF UTERUS    . THYROIDECTOMY    . TONSILLECTOMY     Current Outpatient Medications on File Prior to Visit  Medication Sig Dispense Refill  . budesonide-formoterol (SYMBICORT) 160-4.5 MCG/ACT inhaler Inhale 2 puffs into the lungs 2 (two) times daily. 1 Inhaler 3  . FLUoxetine (PROZAC) 20 MG tablet Take 1 tablet (20 mg total) by mouth daily. 30 tablet 3  . fluticasone (FLONASE) 50 MCG/ACT nasal spray SPRAY 2 SPRAYS INTO EACH NOSTRIL EVERY DAY 16 g 2  . gabapentin (NEURONTIN) 300 MG capsule Take 300 mg by mouth at bedtime.    Marland Kitchen HYDROcodone-acetaminophen (NORCO) 5-325 MG tablet Take 1 tablet by mouth every 6 (six) hours as needed for moderate pain. 30 tablet 0  .  levocetirizine (XYZAL) 5 MG tablet TAKE 1 TABLET BY MOUTH EVERY DAY IN THE EVENING 90 tablet 3  . levothyroxine (SYNTHROID) 50 MCG tablet TAKE 1 TABLET BY MOUTH DAILY BEFORE BREAKFAST 90 tablet 3  . Propylene Glycol-Glycerin (SOOTHE) 0.6-0.6 % SOLN Apply to eye.    . rosuvastatin (CRESTOR) 5 MG tablet TAKE 1 TABLET BY MOUTH EVERY DAY  90 tablet 3  . Tiotropium Bromide Monohydrate (SPIRIVA RESPIMAT) 2.5 MCG/ACT AERS Inhale 1 Inhaler into the lungs daily. 1 g 3  . tiZANidine (ZANAFLEX) 4 MG tablet TAKE 1 TABLET (4 MG TOTAL) BY MOUTH EVERY 6 (SIX) HOURS AS NEEDED FOR MUSCLE SPASMS. 30 tablet 0  . verapamil (CALAN-SR) 120 MG CR tablet TAKE 1 TABLET BY MOUTH EVERY DAY 90 tablet 3   No current facility-administered medications on file prior to visit.    Allergies  Allergen Reactions  . Meloxicam Other (See Comments)    BAD DREAMS, SAD THOUGHTS   Social History   Socioeconomic History  . Marital status: Widowed    Spouse name: Not on file  . Number of children: 0  . Years of education: Not on file  . Highest education level: Not on file  Occupational History    Employer: [employer]  Social Needs  . Financial resource strain: Not on file  . Food insecurity    Worry: Not on file    Inability: Not on file  . Transportation needs    Medical: Not on file    Non-medical: Not on file  Tobacco Use  . Smoking status: Former Smoker    Quit date: 07/24/2005    Years since quitting: 13.3  . Smokeless tobacco: Never Used  Substance and Sexual Activity  . Alcohol use: Yes    Comment: rare  . Drug use: No  . Sexual activity: Not on file  Lifestyle  . Physical activity    Days per week: Not on file    Minutes per session: Not on file  . Stress: Not on file  Relationships  . Social Herbalist on phone: Not on file    Gets together: Not on file    Attends religious service: Not on file    Active member of club or organization: Not on file    Attends meetings of  clubs or organizations: Not on file    Relationship status: Not on file  . Intimate partner violence    Fear of current or ex partner: Not on file    Emotionally abused: Not on file    Physically abused: Not on file    Forced sexual activity: Not on file  Other Topics Concern  . Not on file  Social History Narrative  . Not on file      Review of Systems  All other systems reviewed and are negative.      Objective:   Physical Exam Vitals signs reviewed.  Constitutional:      General: She is not in acute distress.    Appearance: She is well-developed. She is not diaphoretic.  Neck:     Musculoskeletal: Neck supple.     Vascular: No JVD.  Cardiovascular:     Rate and Rhythm: Normal rate and regular rhythm.     Heart sounds: Normal heart sounds.  Pulmonary:     Effort: Pulmonary effort is normal.     Breath sounds: Normal breath sounds.  Musculoskeletal:     Right elbow: She exhibits normal range of motion, no swelling, no effusion and no deformity. Tenderness found.     Right upper arm: She exhibits tenderness. She exhibits no edema and no deformity.       Arms:           Assessment & Plan:  Biceps tendonitis on right  Patient has tendinitis of the biceps insertion.  I have recommended wearing  an elbow sleeve for comfort and support.  I recommended ibuprofen or Aleve twice daily for the next 1 to 2 weeks and applying ice to the inflamed tendon.  I anticipate gradual improvement over the next 1 to 2 weeks.    Reassess in 2 weeks if no better or sooner if worsening.

## 2018-11-20 DIAGNOSIS — J449 Chronic obstructive pulmonary disease, unspecified: Secondary | ICD-10-CM | POA: Diagnosis not present

## 2018-11-22 ENCOUNTER — Other Ambulatory Visit: Payer: Self-pay | Admitting: Family Medicine

## 2018-12-12 ENCOUNTER — Other Ambulatory Visit: Payer: Self-pay

## 2018-12-13 ENCOUNTER — Encounter: Payer: Self-pay | Admitting: Family Medicine

## 2018-12-13 ENCOUNTER — Ambulatory Visit (INDEPENDENT_AMBULATORY_CARE_PROVIDER_SITE_OTHER): Payer: Medicare HMO | Admitting: Family Medicine

## 2018-12-13 VITALS — BP 130/74 | HR 82 | Temp 97.8°F | Resp 16 | Ht 65.5 in | Wt 144.0 lb

## 2018-12-13 DIAGNOSIS — M7521 Bicipital tendinitis, right shoulder: Secondary | ICD-10-CM

## 2018-12-13 MED ORDER — DICLOFENAC SODIUM 1 % EX GEL: 2.0000 g | Freq: Four times a day (QID) | CUTANEOUS | 3 refills | Status: DC

## 2018-12-13 MED ORDER — HYDROCODONE-ACETAMINOPHEN 5-325 MG PO TABS: 1.0000 | ORAL_TABLET | Freq: Four times a day (QID) | ORAL | 0 refills | Status: DC | PRN

## 2018-12-13 NOTE — Progress Notes (Signed)
Subjective:    Patient ID: Mary Blevins, female    DOB: 06-15-1945, 73 y.o.   MRN: [id number]  HPI  11/11/18 Patient was carrying groceries into her house last Wednesday.  At the time she did not experience any pain but shortly thereafter she developed pain in her right elbow at the insertion of the biceps tendon.  Now flexing her elbow against resistance elicits pain in the distal medial portion of the biceps and at the insertion of the biceps tendon in the medial forearm.  She is tender to palpation in that area there.  There is no palpable deformity in the biceps itself.  She has normal flexion and extension of the elbow and normal abduction and rotation of the shoulder.  Movement of the shoulder does not elicit pain.  She denies any neuropathic pain radiating down her arm.  Exacerbating factors include flexing her elbow against resistance.  She has taken Aleve for 2 days and the pain is some better.  At that time, my plan was: Patient has tendinitis of the biceps insertion.  I have recommended wearing an elbow sleeve for comfort and support.  I recommended ibuprofen or Aleve twice daily for the next 1 to 2 weeks and applying ice to the inflamed tendon.  I anticipate gradual improvement over the next 1 to 2 weeks.    Reassess in 2 weeks if no better or sooner if worsening.  12/13/18 Patient presents today stating that the pain is not any better.  However after discussing it with the patient she states the pain is a lot better.  She can still complains of some pain over the lateral insertion of the biceps tendon on her forearm in the antecubital fossa of her right arm.  Pain is exacerbated by resisted flexion.  However her strength is excellent and 5/5 and equal and symmetric in both upper extremities.  There is no erythema or warmth or bruising or swelling.  There is no visible deformity in the biceps muscle suggesting a rupture.  She has normal range of motion in the shoulder although she does  occasionally report some pain in the anterior right shoulder near the glenohumeral joint.  Patient states the pain is at least 50 or 60% better. Past Medical History:  Diagnosis Date  . Arthritis   . COPD (chronic obstructive pulmonary disease) ([place])   . Elevated lipids   . Generalized headaches   . History of pneumonia   . Hyperlipidemia   . Hypothyroidism   . MVP (mitral valve prolapse)    palpitations  . Osteopenia   . Seasonal allergies   . Sinusitis   . Tubular adenoma of colon    Past Surgical History:  Procedure Laterality Date  . COLONOSCOPY WITH PROPOFOL N/A 01/06/2016   Procedure: COLONOSCOPY WITH PROPOFOL;  Surgeon: Doran Stabler, MD;  Location: [facility];  Service: Gastroenterology;  Laterality: N/A;  . DILATION AND CURETTAGE OF UTERUS    . THYROIDECTOMY    . TONSILLECTOMY     Current Outpatient Medications on File Prior to Visit  Medication Sig Dispense Refill  . budesonide-formoterol (SYMBICORT) 160-4.5 MCG/ACT inhaler Inhale 2 puffs into the lungs 2 (two) times daily. 1 Inhaler 3  . FLUoxetine (PROZAC) 20 MG capsule TAKE 1 CAPSULE BY MOUTH EVERY DAY 90 capsule 2  . fluticasone (FLONASE) 50 MCG/ACT nasal spray SPRAY 2 SPRAYS INTO EACH NOSTRIL EVERY DAY 16 g 2  . gabapentin (NEURONTIN) 300 MG capsule Take 300 mg by  mouth at bedtime.    Marland Kitchen levocetirizine (XYZAL) 5 MG tablet TAKE 1 TABLET BY MOUTH EVERY DAY IN THE EVENING 90 tablet 3  . levothyroxine (SYNTHROID) 50 MCG tablet TAKE 1 TABLET BY MOUTH DAILY BEFORE BREAKFAST 90 tablet 3  . Propylene Glycol-Glycerin (SOOTHE) 0.6-0.6 % SOLN Apply to eye.    . rosuvastatin (CRESTOR) 5 MG tablet TAKE 1 TABLET BY MOUTH EVERY DAY 90 tablet 3  . Tiotropium Bromide Monohydrate (SPIRIVA RESPIMAT) 2.5 MCG/ACT AERS Inhale 1 Inhaler into the lungs daily. 1 g 3  . tiZANidine (ZANAFLEX) 4 MG tablet TAKE 1 TABLET (4 MG TOTAL) BY MOUTH EVERY 6 (SIX) HOURS AS NEEDED FOR MUSCLE SPASMS. 30 tablet 0  . verapamil (CALAN-SR) 120 MG CR  tablet TAKE 1 TABLET BY MOUTH EVERY DAY 90 tablet 3   No current facility-administered medications on file prior to visit.    Allergies  Allergen Reactions  . Meloxicam Other (See Comments)    BAD DREAMS, SAD THOUGHTS   Social History   Socioeconomic History  . Marital status: Widowed    Spouse name: Not on file  . Number of children: 0  . Years of education: Not on file  . Highest education level: Not on file  Occupational History    Employer: [employer]  Social Needs  . Financial resource strain: Not on file  . Food insecurity    Worry: Not on file    Inability: Not on file  . Transportation needs    Medical: Not on file    Non-medical: Not on file  Tobacco Use  . Smoking status: Former Smoker    Quit date: 07/24/2005    Years since quitting: 13.3  . Smokeless tobacco: Never Used  Substance and Sexual Activity  . Alcohol use: Yes    Comment: rare  . Drug use: No  . Sexual activity: Not on file  Lifestyle  . Physical activity    Days per week: Not on file    Minutes per session: Not on file  . Stress: Not on file  Relationships  . Social Herbalist on phone: Not on file    Gets together: Not on file    Attends religious service: Not on file    Active member of club or organization: Not on file    Attends meetings of clubs or organizations: Not on file    Relationship status: Not on file  . Intimate partner violence    Fear of current or ex partner: Not on file    Emotionally abused: Not on file    Physically abused: Not on file    Forced sexual activity: Not on file  Other Topics Concern  . Not on file  Social History Narrative  . Not on file    Past Medical History:  Diagnosis Date  . Arthritis   . COPD (chronic obstructive pulmonary disease) ([place])   . Elevated lipids   . Generalized headaches   . History of pneumonia   . Hyperlipidemia   . Hypothyroidism   . MVP (mitral valve prolapse)    palpitations  . Osteopenia    . Seasonal allergies   . Sinusitis   . Tubular adenoma of colon    Past Surgical History:  Procedure Laterality Date  . COLONOSCOPY WITH PROPOFOL N/A 01/06/2016   Procedure: COLONOSCOPY WITH PROPOFOL;  Surgeon: Doran Stabler, MD;  Location: [facility];  Service: Gastroenterology;  Laterality: N/A;  . DILATION AND CURETTAGE  OF UTERUS    . THYROIDECTOMY    . TONSILLECTOMY     Current Outpatient Medications on File Prior to Visit  Medication Sig Dispense Refill  . budesonide-formoterol (SYMBICORT) 160-4.5 MCG/ACT inhaler Inhale 2 puffs into the lungs 2 (two) times daily. 1 Inhaler 3  . FLUoxetine (PROZAC) 20 MG capsule TAKE 1 CAPSULE BY MOUTH EVERY DAY 90 capsule 2  . fluticasone (FLONASE) 50 MCG/ACT nasal spray SPRAY 2 SPRAYS INTO EACH NOSTRIL EVERY DAY 16 g 2  . gabapentin (NEURONTIN) 300 MG capsule Take 300 mg by mouth at bedtime.    Marland Kitchen levocetirizine (XYZAL) 5 MG tablet TAKE 1 TABLET BY MOUTH EVERY DAY IN THE EVENING 90 tablet 3  . levothyroxine (SYNTHROID) 50 MCG tablet TAKE 1 TABLET BY MOUTH DAILY BEFORE BREAKFAST 90 tablet 3  . Propylene Glycol-Glycerin (SOOTHE) 0.6-0.6 % SOLN Apply to eye.    . rosuvastatin (CRESTOR) 5 MG tablet TAKE 1 TABLET BY MOUTH EVERY DAY 90 tablet 3  . Tiotropium Bromide Monohydrate (SPIRIVA RESPIMAT) 2.5 MCG/ACT AERS Inhale 1 Inhaler into the lungs daily. 1 g 3  . tiZANidine (ZANAFLEX) 4 MG tablet TAKE 1 TABLET (4 MG TOTAL) BY MOUTH EVERY 6 (SIX) HOURS AS NEEDED FOR MUSCLE SPASMS. 30 tablet 0  . verapamil (CALAN-SR) 120 MG CR tablet TAKE 1 TABLET BY MOUTH EVERY DAY 90 tablet 3   No current facility-administered medications on file prior to visit.    Allergies  Allergen Reactions  . Meloxicam Other (See Comments)    BAD DREAMS, SAD THOUGHTS   Social History   Socioeconomic History  . Marital status: Widowed    Spouse name: Not on file  . Number of children: 0  . Years of education: Not on file  . Highest education level: Not on file   Occupational History    Employer: [employer]  Social Needs  . Financial resource strain: Not on file  . Food insecurity    Worry: Not on file    Inability: Not on file  . Transportation needs    Medical: Not on file    Non-medical: Not on file  Tobacco Use  . Smoking status: Former Smoker    Quit date: 07/24/2005    Years since quitting: 13.3  . Smokeless tobacco: Never Used  Substance and Sexual Activity  . Alcohol use: Yes    Comment: rare  . Drug use: No  . Sexual activity: Not on file  Lifestyle  . Physical activity    Days per week: Not on file    Minutes per session: Not on file  . Stress: Not on file  Relationships  . Social Herbalist on phone: Not on file    Gets together: Not on file    Attends religious service: Not on file    Active member of club or organization: Not on file    Attends meetings of clubs or organizations: Not on file    Relationship status: Not on file  . Intimate partner violence    Fear of current or ex partner: Not on file    Emotionally abused: Not on file    Physically abused: Not on file    Forced sexual activity: Not on file  Other Topics Concern  . Not on file  Social History Narrative  . Not on file      Review of Systems  All other systems reviewed and are negative.      Objective:   Physical  Exam Vitals signs reviewed.  Constitutional:      General: She is not in acute distress.    Appearance: She is well-developed. She is not diaphoretic.  Neck:     Musculoskeletal: Neck supple.     Vascular: No JVD.  Cardiovascular:     Rate and Rhythm: Normal rate and regular rhythm.     Heart sounds: Normal heart sounds.  Pulmonary:     Effort: Pulmonary effort is normal.     Breath sounds: Normal breath sounds.  Musculoskeletal:     Right elbow: She exhibits normal range of motion, no swelling, no effusion and no deformity. Tenderness found.     Right upper arm: She exhibits tenderness. She  exhibits no edema and no deformity.       Arms:           Assessment & Plan:  Biceps tendonitis on right  We discussed physical therapy but the patient wants to avoid that at this time.  Therefore she will try Voltaren gel 4 times a day as needed for inflammation and pain.  Otherwise there is no visible deformity.  Patient seems to be improving gradually.  Therefore we will continue a conservative strategy at the present time.  If the pain does not improve I would recommend physical therapy or orthopedic consultation however I do not see any need for surgical intervention at this time

## 2018-12-21 DIAGNOSIS — J449 Chronic obstructive pulmonary disease, unspecified: Secondary | ICD-10-CM | POA: Diagnosis not present

## 2019-01-20 DIAGNOSIS — J449 Chronic obstructive pulmonary disease, unspecified: Secondary | ICD-10-CM | POA: Diagnosis not present

## 2019-01-28 ENCOUNTER — Other Ambulatory Visit: Payer: Self-pay

## 2019-01-28 ENCOUNTER — Ambulatory Visit (INDEPENDENT_AMBULATORY_CARE_PROVIDER_SITE_OTHER): Payer: Medicare HMO | Admitting: Family Medicine

## 2019-01-28 DIAGNOSIS — R0602 Shortness of breath: Secondary | ICD-10-CM

## 2019-01-28 MED ORDER — ALPRAZOLAM 0.5 MG PO TABS: 0.5000 mg | ORAL_TABLET | Freq: Three times a day (TID) | ORAL | 0 refills | Status: DC | PRN

## 2019-01-28 MED ORDER — PREDNISONE 20 MG PO TABS: ORAL_TABLET | ORAL | 0 refills | Status: DC

## 2019-01-28 NOTE — Progress Notes (Signed)
Subjective:    Patient ID: Mary Blevins, female    DOB: 1945/10/29, 74 y.o.   MRN: [id number]  HPI Patient is being seen today as a telephone visit.  Phone call began at 1115.  Phone call concluded at 1125.  Patient has a history of COPD.  She states that over the last 2 days she has noticed some increasing shortness of breath.  However she wonders if this could be due to anxiety.  She only feels short of breath occasionally.  Sometimes the albuterol will help.  Other times she will see no benefit from the albuterol.  She has been more upset recently.  She has a leak in her roof.  She has been very concerned about having to have her roof replaced in about how she is going to afford this.  Therefore she feels very anxious and distraught.  She admits that she believes the anxiety could be contributing some to the shortness of breath because when she feels anxious she feels more short of breath.  She denies any fever.  Her temperature has been 98.3.  She denies any rhinorrhea.  She denies any fatigue.  She denies any sore throat.  She denies any change in her sense of smell or taste.  She denies any cough.  She does occasionally have wheezing but this is at her baseline.  She denies any nausea or vomiting or diarrhea or other signs of systemic illness. Past Medical History:  Diagnosis Date  . Arthritis   . COPD (chronic obstructive pulmonary disease) ([place])   . Elevated lipids   . Generalized headaches   . History of pneumonia   . Hyperlipidemia   . Hypothyroidism   . MVP (mitral valve prolapse)    palpitations  . Osteopenia   . Seasonal allergies   . Sinusitis   . Tubular adenoma of colon    Past Surgical History:  Procedure Laterality Date  . COLONOSCOPY WITH PROPOFOL N/A 01/06/2016   Procedure: COLONOSCOPY WITH PROPOFOL;  Surgeon: Doran Stabler, MD;  Location: [facility];  Service: Gastroenterology;  Laterality: N/A;  . DILATION AND CURETTAGE OF UTERUS    . THYROIDECTOMY      . TONSILLECTOMY     Current Outpatient Medications on File Prior to Visit  Medication Sig Dispense Refill  . budesonide-formoterol (SYMBICORT) 160-4.5 MCG/ACT inhaler Inhale 2 puffs into the lungs 2 (two) times daily. 1 Inhaler 3  . diclofenac Sodium (VOLTAREN) 1 % GEL Apply 2 g topically 4 (four) times daily. 100 g 3  . FLUoxetine (PROZAC) 20 MG capsule TAKE 1 CAPSULE BY MOUTH EVERY DAY 90 capsule 2  . fluticasone (FLONASE) 50 MCG/ACT nasal spray SPRAY 2 SPRAYS INTO EACH NOSTRIL EVERY DAY 16 g 2  . gabapentin (NEURONTIN) 300 MG capsule Take 300 mg by mouth at bedtime.    Marland Kitchen HYDROcodone-acetaminophen (NORCO) 5-325 MG tablet Take 1 tablet by mouth every 6 (six) hours as needed for moderate pain. 30 tablet 0  . levocetirizine (XYZAL) 5 MG tablet TAKE 1 TABLET BY MOUTH EVERY DAY IN THE EVENING 90 tablet 3  . levothyroxine (SYNTHROID) 50 MCG tablet TAKE 1 TABLET BY MOUTH DAILY BEFORE BREAKFAST 90 tablet 3  . Propylene Glycol-Glycerin (SOOTHE) 0.6-0.6 % SOLN Apply to eye.    . rosuvastatin (CRESTOR) 5 MG tablet TAKE 1 TABLET BY MOUTH EVERY DAY 90 tablet 3  . Tiotropium Bromide Monohydrate (SPIRIVA RESPIMAT) 2.5 MCG/ACT AERS Inhale 1 Inhaler into the lungs daily. 1 g 3  .  tiZANidine (ZANAFLEX) 4 MG tablet TAKE 1 TABLET (4 MG TOTAL) BY MOUTH EVERY 6 (SIX) HOURS AS NEEDED FOR MUSCLE SPASMS. 30 tablet 0  . verapamil (CALAN-SR) 120 MG CR tablet TAKE 1 TABLET BY MOUTH EVERY DAY 90 tablet 3   No current facility-administered medications on file prior to visit.   Allergies  Allergen Reactions  . Meloxicam Other (See Comments)    BAD DREAMS, SAD THOUGHTS   Social History   Socioeconomic History  . Marital status: Widowed    Spouse name: Not on file  . Number of children: 0  . Years of education: Not on file  . Highest education level: Not on file  Occupational History    Employer: [employer]  Tobacco Use  . Smoking status: Former Smoker    Quit date: 07/24/2005    Years since  quitting: 13.5  . Smokeless tobacco: Never Used  Substance and Sexual Activity  . Alcohol use: Yes    Comment: rare  . Drug use: No  . Sexual activity: Not on file  Other Topics Concern  . Not on file  Social History Narrative  . Not on file   Social Determinants of Health   Financial Resource Strain:   . Difficulty of Paying Living Expenses: Not on file  Food Insecurity:   . Worried About Charity fundraiser in the Last Year: Not on file  . Ran Out of Food in the Last Year: Not on file  Transportation Needs:   . Lack of Transportation (Medical): Not on file  . Lack of Transportation (Non-Medical): Not on file  Physical Activity:   . Days of Exercise per Week: Not on file  . Minutes of Exercise per Session: Not on file  Stress:   . Feeling of Stress : Not on file  Social Connections:   . Frequency of Communication with Friends and Family: Not on file  . Frequency of Social Gatherings with Friends and Family: Not on file  . Attends Religious Services: Not on file  . Active Member of Clubs or Organizations: Not on file  . Attends Archivist Meetings: Not on file  . Marital Status: Not on file  Intimate Partner Violence:   . Fear of Current or Ex-Partner: Not on file  . Emotionally Abused: Not on file  . Physically Abused: Not on file  . Sexually Abused: Not on file      Review of Systems  All other systems reviewed and are negative.      Objective:   Physical Exam        Assessment & Plan:  Shortness of breath  I believe the patient's dyspnea is likely anxiety related.  Her dyspnea only occurs when she is upset worried about her financial situation.  She does occasionally have wheezing however this is at her baseline and she has not seen any significant improvement with the shortness of breath when she uses her albuterol.  She denies any cough or signs of pneumonia or respiratory illness.  She denies any chest pain.  The shortness of breath seems to  come and go with no specific trigger other than anxiety.  Therefore I have recommended trying Xanax 0.5 mg p.o. every 8 hours as needed anxiety.  Patient will try the anxiety medicine first however if she does start to wheeze more and experience more congestion and bronchospasm responsive to albuterol, then she can use the prednisone taper pack that I will also send to her pharmacy  however I cautioned the patient against that at the present time as she is speaking full and complete sentences with no respiratory distress.  She does not sound labored.  I cannot hear any wheezing over the telephone.  I feel that she is more likely dealing with anxiety.  Patient agrees and is willing to try the Xanax first.  If the situation worsens she is to seek medical attention immediately.

## 2019-01-29 ENCOUNTER — Other Ambulatory Visit: Payer: Self-pay | Admitting: Interventional Cardiology

## 2019-01-30 ENCOUNTER — Telehealth: Payer: Self-pay | Admitting: Family Medicine

## 2019-01-30 NOTE — Telephone Encounter (Signed)
Patient is requesting a refill on Hydrocodone   LOV: 01/28/2019  LRF:   12/13/2018

## 2019-01-31 MED ORDER — HYDROCODONE-ACETAMINOPHEN 5-325 MG PO TABS: 1.0000 | ORAL_TABLET | Freq: Four times a day (QID) | ORAL | 0 refills | Status: DC | PRN

## 2019-02-04 ENCOUNTER — Telehealth: Payer: Self-pay | Admitting: Family Medicine

## 2019-02-04 DIAGNOSIS — Z20822 Contact with and (suspected) exposure to covid-19: Secondary | ICD-10-CM | POA: Diagnosis not present

## 2019-02-04 NOTE — Telephone Encounter (Signed)
Pt called and wanted to know if you wanted her to continue to take the Prozac and Xyzal?

## 2019-02-04 NOTE — Telephone Encounter (Signed)
Prozac yes, xyzal only as needed

## 2019-02-06 NOTE — Telephone Encounter (Signed)
Pt aware via angela

## 2019-02-10 ENCOUNTER — Other Ambulatory Visit: Payer: Self-pay

## 2019-02-10 ENCOUNTER — Ambulatory Visit (INDEPENDENT_AMBULATORY_CARE_PROVIDER_SITE_OTHER): Payer: Medicare HMO | Admitting: Family Medicine

## 2019-02-10 ENCOUNTER — Encounter: Payer: Self-pay | Admitting: Family Medicine

## 2019-02-10 VITALS — BP 112/72 | HR 100 | Temp 96.4°F | Resp 16 | Ht 65.5 in | Wt 144.0 lb

## 2019-02-10 DIAGNOSIS — M7581 Other shoulder lesions, right shoulder: Secondary | ICD-10-CM

## 2019-02-10 DIAGNOSIS — M25511 Pain in right shoulder: Secondary | ICD-10-CM

## 2019-02-10 DIAGNOSIS — H8110 Benign paroxysmal vertigo, unspecified ear: Secondary | ICD-10-CM

## 2019-02-10 MED ORDER — MECLIZINE HCL 25 MG PO TABS: 25.0000 mg | ORAL_TABLET | Freq: Three times a day (TID) | ORAL | 0 refills | Status: DC | PRN

## 2019-02-10 NOTE — Progress Notes (Signed)
Subjective:    Patient ID: Mary Blevins, female    DOB: [birth year], 74 y.o.   MRN: [id number]  HPI Patient reports pain in her right shoulder now for more than a week.  It hurts to abduct her arm greater than 90 degrees.  It hurts with internal and external rotation.  She has significant pain with empty can testing and with drop testing however she has normal strength and is able to resist downward force with abduction well.  Her shoulder is aching and throbbing at night and keeping her awake.  She also reports vertigo today with standing from a seated position.  The room spins whenever she rises from a seated position.  She has a positive Dix-Hallpike maneuver.  She denies any head trauma.  She denies any otalgia or tinnitus.  She denies any sinus pain.  She denies any orthostatic hypotension. Past Medical History:  Diagnosis Date  . Arthritis   . COPD (chronic obstructive pulmonary disease) ([place])   . Elevated lipids   . Generalized headaches   . History of pneumonia   . Hyperlipidemia   . Hypothyroidism   . MVP (mitral valve prolapse)    palpitations  . Osteopenia   . Seasonal allergies   . Sinusitis   . Tubular adenoma of colon    Past Surgical History:  Procedure Laterality Date  . COLONOSCOPY WITH PROPOFOL N/A 01/06/2016   Procedure: COLONOSCOPY WITH PROPOFOL;  Surgeon: Doran Stabler, MD;  Location: [facility];  Service: Gastroenterology;  Laterality: N/A;  . DILATION AND CURETTAGE OF UTERUS    . THYROIDECTOMY    . TONSILLECTOMY     Current Outpatient Medications on File Prior to Visit  Medication Sig Dispense Refill  . ALPRAZolam (XANAX) 0.5 MG tablet Take 1 tablet (0.5 mg total) by mouth 3 (three) times daily as needed. 30 tablet 0  . budesonide-formoterol (SYMBICORT) 160-4.5 MCG/ACT inhaler Inhale 2 puffs into the lungs 2 (two) times daily. 1 Inhaler 3  . diclofenac Sodium (VOLTAREN) 1 % GEL Apply 2 g topically 4 (four) times daily. 100 g 3  . FLUoxetine  (PROZAC) 20 MG capsule TAKE 1 CAPSULE BY MOUTH EVERY DAY 90 capsule 2  . fluticasone (FLONASE) 50 MCG/ACT nasal spray SPRAY 2 SPRAYS INTO EACH NOSTRIL EVERY DAY 16 g 2  . gabapentin (NEURONTIN) 300 MG capsule Take 300 mg by mouth at bedtime.    Marland Kitchen HYDROcodone-acetaminophen (NORCO) 5-325 MG tablet Take 1 tablet by mouth every 6 (six) hours as needed for moderate pain. 30 tablet 0  . levocetirizine (XYZAL) 5 MG tablet TAKE 1 TABLET BY MOUTH EVERY DAY IN THE EVENING 90 tablet 3  . levothyroxine (SYNTHROID) 50 MCG tablet TAKE 1 TABLET BY MOUTH DAILY BEFORE BREAKFAST 90 tablet 3  . Propylene Glycol-Glycerin (SOOTHE) 0.6-0.6 % SOLN Apply to eye.    . rosuvastatin (CRESTOR) 5 MG tablet TAKE 1 TABLET BY MOUTH EVERY DAY 90 tablet 3  . Tiotropium Bromide Monohydrate (SPIRIVA RESPIMAT) 2.5 MCG/ACT AERS Inhale 1 Inhaler into the lungs daily. 1 g 3  . tiZANidine (ZANAFLEX) 4 MG tablet TAKE 1 TABLET (4 MG TOTAL) BY MOUTH EVERY 6 (SIX) HOURS AS NEEDED FOR MUSCLE SPASMS. 30 tablet 0  . verapamil (CALAN-SR) 120 MG CR tablet Take 1 tablet (120 mg total) by mouth daily. Please keep upcoming appt in March with Dr. Tamala Julian for future refills. Thank you 90 tablet 0   No current facility-administered medications on file prior to visit.  Allergies  Allergen Reactions  . Meloxicam Other (See Comments)    BAD DREAMS, SAD THOUGHTS   Social History   Socioeconomic History  . Marital status: Widowed    Spouse name: Not on file  . Number of children: 0  . Years of education: Not on file  . Highest education level: Not on file  Occupational History    Employer: [employer]  Tobacco Use  . Smoking status: Former Smoker    Quit date: 07/24/2005    Years since quitting: 13.5  . Smokeless tobacco: Never Used  Substance and Sexual Activity  . Alcohol use: Yes    Comment: rare  . Drug use: No  . Sexual activity: Not on file  Other Topics Concern  . Not on file  Social History Narrative  . Not on file    Social Determinants of Health   Financial Resource Strain:   . Difficulty of Paying Living Expenses: Not on file  Food Insecurity:   . Worried About Charity fundraiser in the Last Year: Not on file  . Ran Out of Food in the Last Year: Not on file  Transportation Needs:   . Lack of Transportation (Medical): Not on file  . Lack of Transportation (Non-Medical): Not on file  Physical Activity:   . Days of Exercise per Week: Not on file  . Minutes of Exercise per Session: Not on file  Stress:   . Feeling of Stress : Not on file  Social Connections:   . Frequency of Communication with Friends and Family: Not on file  . Frequency of Social Gatherings with Friends and Family: Not on file  . Attends Religious Services: Not on file  . Active Member of Clubs or Organizations: Not on file  . Attends Archivist Meetings: Not on file  . Marital Status: Not on file  Intimate Partner Violence:   . Fear of Current or Ex-Partner: Not on file  . Emotionally Abused: Not on file  . Physically Abused: Not on file  . Sexually Abused: Not on file    Past Medical History:  Diagnosis Date  . Arthritis   . COPD (chronic obstructive pulmonary disease) ([place])   . Elevated lipids   . Generalized headaches   . History of pneumonia   . Hyperlipidemia   . Hypothyroidism   . MVP (mitral valve prolapse)    palpitations  . Osteopenia   . Seasonal allergies   . Sinusitis   . Tubular adenoma of colon    Past Surgical History:  Procedure Laterality Date  . COLONOSCOPY WITH PROPOFOL N/A 01/06/2016   Procedure: COLONOSCOPY WITH PROPOFOL;  Surgeon: Doran Stabler, MD;  Location: [facility];  Service: Gastroenterology;  Laterality: N/A;  . DILATION AND CURETTAGE OF UTERUS    . THYROIDECTOMY    . TONSILLECTOMY     Current Outpatient Medications on File Prior to Visit  Medication Sig Dispense Refill  . ALPRAZolam (XANAX) 0.5 MG tablet Take 1 tablet (0.5 mg total) by mouth 3 (three) times  daily as needed. 30 tablet 0  . budesonide-formoterol (SYMBICORT) 160-4.5 MCG/ACT inhaler Inhale 2 puffs into the lungs 2 (two) times daily. 1 Inhaler 3  . diclofenac Sodium (VOLTAREN) 1 % GEL Apply 2 g topically 4 (four) times daily. 100 g 3  . FLUoxetine (PROZAC) 20 MG capsule TAKE 1 CAPSULE BY MOUTH EVERY DAY 90 capsule 2  . fluticasone (FLONASE) 50 MCG/ACT nasal spray SPRAY 2 SPRAYS INTO [facility]  NOSTRIL EVERY DAY 16 g 2  . gabapentin (NEURONTIN) 300 MG capsule Take 300 mg by mouth at bedtime.    Marland Kitchen HYDROcodone-acetaminophen (NORCO) 5-325 MG tablet Take 1 tablet by mouth every 6 (six) hours as needed for moderate pain. 30 tablet 0  . levocetirizine (XYZAL) 5 MG tablet TAKE 1 TABLET BY MOUTH EVERY DAY IN THE EVENING 90 tablet 3  . levothyroxine (SYNTHROID) 50 MCG tablet TAKE 1 TABLET BY MOUTH DAILY BEFORE BREAKFAST 90 tablet 3  . Propylene Glycol-Glycerin (SOOTHE) 0.6-0.6 % SOLN Apply to eye.    . rosuvastatin (CRESTOR) 5 MG tablet TAKE 1 TABLET BY MOUTH EVERY DAY 90 tablet 3  . Tiotropium Bromide Monohydrate (SPIRIVA RESPIMAT) 2.5 MCG/ACT AERS Inhale 1 Inhaler into the lungs daily. 1 g 3  . tiZANidine (ZANAFLEX) 4 MG tablet TAKE 1 TABLET (4 MG TOTAL) BY MOUTH EVERY 6 (SIX) HOURS AS NEEDED FOR MUSCLE SPASMS. 30 tablet 0  . verapamil (CALAN-SR) 120 MG CR tablet Take 1 tablet (120 mg total) by mouth daily. Please keep upcoming appt in March with Dr. Tamala Julian for future refills. Thank you 90 tablet 0   No current facility-administered medications on file prior to visit.   Allergies  Allergen Reactions  . Meloxicam Other (See Comments)    BAD DREAMS, SAD THOUGHTS   Social History   Socioeconomic History  . Marital status: Widowed    Spouse name: Not on file  . Number of children: 0  . Years of education: Not on file  . Highest education level: Not on file  Occupational History    Employer: [employer]  Tobacco Use  . Smoking status: Former Smoker    Quit date: 07/24/2005     Years since quitting: 13.5  . Smokeless tobacco: Never Used  Substance and Sexual Activity  . Alcohol use: Yes    Comment: rare  . Drug use: No  . Sexual activity: Not on file  Other Topics Concern  . Not on file  Social History Narrative  . Not on file   Social Determinants of Health   Financial Resource Strain:   . Difficulty of Paying Living Expenses: Not on file  Food Insecurity:   . Worried About Charity fundraiser in the Last Year: Not on file  . Ran Out of Food in the Last Year: Not on file  Transportation Needs:   . Lack of Transportation (Medical): Not on file  . Lack of Transportation (Non-Medical): Not on file  Physical Activity:   . Days of Exercise per Week: Not on file  . Minutes of Exercise per Session: Not on file  Stress:   . Feeling of Stress : Not on file  Social Connections:   . Frequency of Communication with Friends and Family: Not on file  . Frequency of Social Gatherings with Friends and Family: Not on file  . Attends Religious Services: Not on file  . Active Member of Clubs or Organizations: Not on file  . Attends Archivist Meetings: Not on file  . Marital Status: Not on file  Intimate Partner Violence:   . Fear of Current or Ex-Partner: Not on file  . Emotionally Abused: Not on file  . Physically Abused: Not on file  . Sexually Abused: Not on file      Review of Systems  All other systems reviewed and are negative.      Objective:   Physical Exam Vitals reviewed.  Constitutional:  General: She is not in acute distress.    Appearance: She is well-developed. She is not diaphoretic.  HENT:     Right Ear: Hearing, tympanic membrane and ear canal normal.     Left Ear: Hearing, tympanic membrane and ear canal normal.  Neck:     Vascular: No JVD.  Cardiovascular:     Rate and Rhythm: Normal rate and regular rhythm.     Heart sounds: Normal heart sounds.  Pulmonary:     Effort: Pulmonary effort is normal. No respiratory  distress.     Breath sounds: Normal breath sounds. No stridor. No wheezing or rales.  Chest:     Chest wall: No tenderness.  Musculoskeletal:     Right shoulder: Tenderness present. No swelling, effusion or bony tenderness. Decreased range of motion. Normal strength.     Cervical back: Neck supple.           Assessment & Plan:  Rotator cuff tendonitis, right  BPPV (benign paroxysmal positional vertigo), unspecified laterality   I believe the patient has supraspinatus tendinitis.  She is tried and failed NSAIDs.  We discussed her options and she would like to try cortisone shot in the right shoulder.  Using sterile technique from a posterior approach, I injected the right subacromial space with 2 cc lidocaine, 2 cc of Marcaine, and 2 cc of 40 mg/mL Kenalog.  The patient tolerated the procedure well without complication.  Reassess if no better in 1 week or sooner if worse  I believe the patient's dizziness is most likely BPPV.  I recommended meclizine 25 mg every 8 hours as needed for dizziness.  Anticipate symptoms to gradually improve over the next 1 to 2 weeks.  Patient was given precautions regarding falls and driving.

## 2019-02-12 ENCOUNTER — Telehealth: Payer: Self-pay

## 2019-02-12 NOTE — Telephone Encounter (Signed)
Pt called in today reporting that she is feeling much better. Pt did state that on Tues 01/19, she woke up and could not taste anything. Pt is still experiencing no taste. Pt also states that she got tested for covid on 01/12 and the results were negative. Please advise.

## 2019-02-13 ENCOUNTER — Ambulatory Visit: Payer: Medicare HMO | Attending: Internal Medicine

## 2019-02-13 ENCOUNTER — Other Ambulatory Visit: Payer: Self-pay

## 2019-02-13 DIAGNOSIS — Z20822 Contact with and (suspected) exposure to covid-19: Secondary | ICD-10-CM | POA: Diagnosis not present

## 2019-02-13 NOTE — Telephone Encounter (Signed)
Pt.notified

## 2019-02-13 NOTE — Telephone Encounter (Signed)
Please inform patient how she can get retested  for covid.

## 2019-02-14 LAB — NOVEL CORONAVIRUS, NAA: SARS-CoV-2, NAA: DETECTED — AB

## 2019-02-15 ENCOUNTER — Telehealth: Payer: Self-pay | Admitting: Infectious Diseases

## 2019-02-15 NOTE — Telephone Encounter (Signed)
Called to discuss with patient about Covid symptoms and the use of bamlanivimab, a monoclonal antibody infusion for those with mild to moderate Covid symptoms and at a high risk of hospitalization.  Pt is qualified for this infusion at the [facility] infusion center due to Age > 65   Symptoms started on  1/19 when she suddenly lost her sense of taste and has since started with a cough productive of phlegm. Otherwise feeling well and taking vitamins recommended from a friend.   She would like to discuss with her primary care doctor about the bamlanivimab infusion. I gave her the number to call back to the clinic if she decides to proceed. She was grateful for my call.

## 2019-02-16 ENCOUNTER — Other Ambulatory Visit: Payer: Self-pay | Admitting: Physician Assistant

## 2019-02-16 DIAGNOSIS — R54 Age-related physical debility: Secondary | ICD-10-CM

## 2019-02-16 DIAGNOSIS — U071 COVID-19: Secondary | ICD-10-CM

## 2019-02-16 NOTE — Progress Notes (Signed)
  I connected by phone with Mary Blevins on 02/16/2019 at 2:12 PM to discuss the potential use of an new treatment for mild to moderate COVID-19 viral infection in non-hospitalized patients.  This patient is a 74 y.o. female that meets the FDA criteria for Emergency Use Authorization of bamlanivimab or casirivimab\imdevimab.  Has a (+) direct SARS-CoV-2 viral test result  Has mild or moderate COVID-19   Is ? 74 years of age and weighs ? 40 kg  Is NOT hospitalized due to COVID-19  Is NOT requiring oxygen therapy or requiring an increase in baseline oxygen flow rate due to COVID-19  Is within 10 days of symptom onset  Has at least one of the high risk factor(s) for progression to severe COVID-19 and/or hospitalization as defined in EUA.  Specific high risk criteria : >/= 74 yo   I have spoken and communicated the following to the patient or parent/caregiver:  1. FDA has authorized the emergency use of bamlanivimab and casirivimab\imdevimab for the treatment of mild to moderate COVID-19 in adults and pediatric patients with positive results of direct SARS-CoV-2 viral testing who are 20 years of age and older weighing at least 40 kg, and who are at high risk for progressing to severe COVID-19 and/or hospitalization.  2. The significant known and potential risks and benefits of bamlanivimab and casirivimab\imdevimab, and the extent to which such potential risks and benefits are unknown.  3. Information on available alternative treatments and the risks and benefits of those alternatives, including clinical trials.  4. Patients treated with bamlanivimab and casirivimab\imdevimab should continue to self-isolate and use infection control measures (e.g., wear mask, isolate, social distance, avoid sharing personal items, clean and disinfect "high touch" surfaces, and frequent handwashing) according to CDC guidelines.   5. The patient or parent/caregiver has the option to accept or refuse  bamlanivimab or casirivimab\imdevimab .  After reviewing this information with the patient, The patient agreed to proceed with receiving the bamlanimivab infusion and will be provided a copy of the Fact sheet prior to receiving the infusion.   Pt scheduled for 02/19/19 @ 4:30pm which is day 8 of her symptoms.   [name] 02/16/2019 2:12 PM  CC: Dr. Dennard Schaumann

## 2019-02-17 ENCOUNTER — Telehealth: Payer: Self-pay | Admitting: Family Medicine

## 2019-02-17 NOTE — Telephone Encounter (Signed)
Patient calling to let you know that she did get scheduled for an infusion, however noone wants to take her and be in the car with her, would like to know what dr pickard thinks about that  [phone]

## 2019-02-17 NOTE — Telephone Encounter (Signed)
Patient aware of providers recommendations.  

## 2019-02-17 NOTE — Telephone Encounter (Signed)
Can one of her relatives who had covid carry her since they are likely immune.

## 2019-02-19 ENCOUNTER — Ambulatory Visit (HOSPITAL_COMMUNITY)
Admission: RE | Admit: 2019-02-19 | Discharge: 2019-02-19 | Disposition: A | Discharge status: Home / Self Care | Payer: Medicare Other | Source: Ambulatory Visit | Attending: Pulmonary Disease | Admitting: Pulmonary Disease

## 2019-02-19 DIAGNOSIS — Z23 Encounter for immunization: Secondary | ICD-10-CM | POA: Insufficient documentation

## 2019-02-19 DIAGNOSIS — R54 Age-related physical debility: Secondary | ICD-10-CM | POA: Diagnosis present

## 2019-02-19 DIAGNOSIS — U071 COVID-19: Secondary | ICD-10-CM | POA: Diagnosis present

## 2019-02-19 MED ORDER — DIPHENHYDRAMINE HCL 50 MG/ML IJ SOLN: 50.0000 mg | Freq: Once | INTRAMUSCULAR | Status: DC | PRN

## 2019-02-19 MED ORDER — ALBUTEROL SULFATE HFA 108 (90 BASE) MCG/ACT IN AERS: 2.0000 | INHALATION_SPRAY | Freq: Once | RESPIRATORY_TRACT | Status: DC | PRN

## 2019-02-19 MED ORDER — FAMOTIDINE IN NACL 20-0.9 MG/50ML-% IV SOLN: 20.0000 mg | Freq: Once | INTRAVENOUS | Status: DC | PRN

## 2019-02-19 MED ORDER — SODIUM CHLORIDE 0.9 % IV SOLN
700.0000 mg | Freq: Once | INTRAVENOUS | Status: AC
  Administered 2019-02-19: 700 mg via INTRAVENOUS
  Filled 2019-02-19: qty 20

## 2019-02-19 MED ORDER — SODIUM CHLORIDE 0.9 % IV SOLN
INTRAVENOUS | Status: DC | PRN
  Administered 2019-02-19: 16:00:00 250 mL via INTRAVENOUS

## 2019-02-19 MED ORDER — EPINEPHRINE 0.3 MG/0.3ML IJ SOAJ: 0.3000 mg | Freq: Once | INTRAMUSCULAR | Status: DC | PRN

## 2019-02-19 MED ORDER — METHYLPREDNISOLONE SODIUM SUCC 125 MG IJ SOLR: 125.0000 mg | Freq: Once | INTRAMUSCULAR | Status: DC | PRN

## 2019-02-19 NOTE — Discharge Instructions (Signed)

## 2019-02-19 NOTE — Progress Notes (Signed)
  Diagnosis: COVID-19  Physician: Dr. Jenna Luo  Procedure: Covid Infusion Clinic Med: bamlanivimab infusion - Provided patient with bamlanimivab fact sheet for patients, parents and caregivers prior to infusion.  Complications: No immediate complications noted.  Discharge: Discharged home   Mary Blevins L 02/19/2019

## 2019-02-20 DIAGNOSIS — J449 Chronic obstructive pulmonary disease, unspecified: Secondary | ICD-10-CM | POA: Diagnosis not present

## 2019-02-24 ENCOUNTER — Telehealth: Payer: Self-pay | Admitting: Family Medicine

## 2019-02-24 MED ORDER — SPIRIVA RESPIMAT 2.5 MCG/ACT IN AERS: 1.0000 | INHALATION_SPRAY | Freq: Every day | RESPIRATORY_TRACT | 3 refills | Status: DC

## 2019-02-24 NOTE — Telephone Encounter (Signed)
Patient calling to get refill on spiriva  [facility] [place]

## 2019-02-24 NOTE — Telephone Encounter (Signed)
Medication called/sent to requested pharmacy  

## 2019-03-10 ENCOUNTER — Encounter: Payer: Self-pay | Admitting: Family Medicine

## 2019-03-10 ENCOUNTER — Ambulatory Visit (INDEPENDENT_AMBULATORY_CARE_PROVIDER_SITE_OTHER): Payer: Medicare HMO | Admitting: Family Medicine

## 2019-03-10 ENCOUNTER — Other Ambulatory Visit: Payer: Self-pay

## 2019-03-10 VITALS — BP 110/74 | HR 70 | Temp 97.4°F | Resp 18 | Ht 65.5 in | Wt 139.0 lb

## 2019-03-10 DIAGNOSIS — E039 Hypothyroidism, unspecified: Secondary | ICD-10-CM | POA: Diagnosis not present

## 2019-03-10 DIAGNOSIS — E7800 Pure hypercholesterolemia, unspecified: Secondary | ICD-10-CM | POA: Diagnosis not present

## 2019-03-10 DIAGNOSIS — J42 Unspecified chronic bronchitis: Secondary | ICD-10-CM | POA: Diagnosis not present

## 2019-03-10 NOTE — Progress Notes (Signed)
Subjective:    Patient ID: Mary Blevins, female    DOB: [birth year], 74 y.o.   MRN: [id number]  HPI Patient battled COVID-19 earlier this year but she has recovered completely.  She denies any chest pain.  She denies any shortness of breath.  She denies any dyspnea on exertion.  She does have COPD but she is compliant in using her Symbicort 2 puffs inhaled twice daily.  She does report a cough productive of clear mucus most mornings however she denies any purulent sputum or chest pain.  She denies any pleurisy or fevers.  She has a history of hypothyroidism for which she takes levothyroxine 50 mcg a day.  She is overdue to check a TSH.  This was last checked in April 2020 and was therapeutic at that time however we need to ensure that she is on adequate dosage of levothyroxine.  She also has a history of hyperlipidemia for which she takes Crestor.  She denies any myalgias right upper quadrant pain.  She is due to recheck fasting lipid panel.  Her blood pressure today is slightly on the low side.  Patient does report episodic dizziness which sounds like lightheadedness due to cerebral hypoperfusion.  I question if maybe she should decrease or even discontinue her verapamil.  She has an appointment to see her cardiologist in March.  I have suggested that she discuss with him whether or not she needs to maintain this medication.  The present time her heart rate is well controlled.  Her blood pressure if anything is low and she denies any palpitations or tachycardia.  I am concerned that perhaps her dizziness could be due to low blood pressure.  She did recently have an episode where she developed a "cramp" in the center of her left hand.  Her third digit "spasm down".  It sounds like it was in a contracture and I suspect a trigger finger.  She has never had it since.  She has normal grip strength today.  She has normal range of motion of her MCP, PIP, and DIP joints. Past Medical History:  Diagnosis Date   . Arthritis   . COPD (chronic obstructive pulmonary disease) ([place])   . Elevated lipids   . Generalized headaches   . History of pneumonia   . Hyperlipidemia   . Hypothyroidism   . MVP (mitral valve prolapse)    palpitations  . Osteopenia   . Seasonal allergies   . Sinusitis   . Tubular adenoma of colon    Past Surgical History:  Procedure Laterality Date  . COLONOSCOPY WITH PROPOFOL N/A 01/06/2016   Procedure: COLONOSCOPY WITH PROPOFOL;  Surgeon: Doran Stabler, MD;  Location: [facility];  Service: Gastroenterology;  Laterality: N/A;  . DILATION AND CURETTAGE OF UTERUS    . THYROIDECTOMY    . TONSILLECTOMY     Current Outpatient Medications on File Prior to Visit  Medication Sig Dispense Refill  . ALPRAZolam (XANAX) 0.5 MG tablet Take 1 tablet (0.5 mg total) by mouth 3 (three) times daily as needed. 30 tablet 0  . budesonide-formoterol (SYMBICORT) 160-4.5 MCG/ACT inhaler Inhale 2 puffs into the lungs 2 (two) times daily. 1 Inhaler 3  . diclofenac Sodium (VOLTAREN) 1 % GEL Apply 2 g topically 4 (four) times daily. 100 g 3  . FLUoxetine (PROZAC) 20 MG capsule TAKE 1 CAPSULE BY MOUTH EVERY DAY 90 capsule 2  . fluticasone (FLONASE) 50 MCG/ACT nasal spray SPRAY 2 SPRAYS INTO EACH NOSTRIL EVERY  DAY 16 g 2  . gabapentin (NEURONTIN) 300 MG capsule Take 300 mg by mouth at bedtime.    Marland Kitchen HYDROcodone-acetaminophen (NORCO) 5-325 MG tablet Take 1 tablet by mouth every 6 (six) hours as needed for moderate pain. 30 tablet 0  . levocetirizine (XYZAL) 5 MG tablet TAKE 1 TABLET BY MOUTH EVERY DAY IN THE EVENING 90 tablet 3  . levothyroxine (SYNTHROID) 50 MCG tablet TAKE 1 TABLET BY MOUTH DAILY BEFORE BREAKFAST 90 tablet 3  . meclizine (ANTIVERT) 25 MG tablet Take 1 tablet (25 mg total) by mouth 3 (three) times daily as needed for dizziness. 30 tablet 0  . Propylene Glycol-Glycerin (SOOTHE) 0.6-0.6 % SOLN Apply to eye.    . rosuvastatin (CRESTOR) 5 MG tablet TAKE 1 TABLET BY MOUTH EVERY DAY 90  tablet 3  . Tiotropium Bromide Monohydrate (SPIRIVA RESPIMAT) 2.5 MCG/ACT AERS Inhale 1 Inhaler into the lungs daily. 1 g 3  . tiZANidine (ZANAFLEX) 4 MG tablet TAKE 1 TABLET (4 MG TOTAL) BY MOUTH EVERY 6 (SIX) HOURS AS NEEDED FOR MUSCLE SPASMS. 30 tablet 0  . verapamil (CALAN-SR) 120 MG CR tablet Take 1 tablet (120 mg total) by mouth daily. Please keep upcoming appt in March with Dr. Tamala Julian for future refills. Thank you 90 tablet 0   No current facility-administered medications on file prior to visit.   Allergies  Allergen Reactions  . Meloxicam Other (See Comments)    BAD DREAMS, SAD THOUGHTS   Social History   Socioeconomic History  . Marital status: Widowed    Spouse name: Not on file  . Number of children: 0  . Years of education: Not on file  . Highest education level: Not on file  Occupational History    Employer: [employer]  Tobacco Use  . Smoking status: Former Smoker    Quit date: 07/24/2005    Years since quitting: 13.6  . Smokeless tobacco: Never Used  Substance and Sexual Activity  . Alcohol use: Yes    Comment: rare  . Drug use: No  . Sexual activity: Not on file  Other Topics Concern  . Not on file  Social History Narrative  . Not on file   Social Determinants of Health   Financial Resource Strain:   . Difficulty of Paying Living Expenses: Not on file  Food Insecurity:   . Worried About Charity fundraiser in the Last Year: Not on file  . Ran Out of Food in the Last Year: Not on file  Transportation Needs:   . Lack of Transportation (Medical): Not on file  . Lack of Transportation (Non-Medical): Not on file  Physical Activity:   . Days of Exercise per Week: Not on file  . Minutes of Exercise per Session: Not on file  Stress:   . Feeling of Stress : Not on file  Social Connections:   . Frequency of Communication with Friends and Family: Not on file  . Frequency of Social Gatherings with Friends and Family: Not on file  . Attends  Religious Services: Not on file  . Active Member of Clubs or Organizations: Not on file  . Attends Archivist Meetings: Not on file  . Marital Status: Not on file  Intimate Partner Violence:   . Fear of Current or Ex-Partner: Not on file  . Emotionally Abused: Not on file  . Physically Abused: Not on file  . Sexually Abused: Not on file    Past Medical History:  Diagnosis Date  .  Arthritis   . COPD (chronic obstructive pulmonary disease) ([place])   . Elevated lipids   . Generalized headaches   . History of pneumonia   . Hyperlipidemia   . Hypothyroidism   . MVP (mitral valve prolapse)    palpitations  . Osteopenia   . Seasonal allergies   . Sinusitis   . Tubular adenoma of colon    Past Surgical History:  Procedure Laterality Date  . COLONOSCOPY WITH PROPOFOL N/A 01/06/2016   Procedure: COLONOSCOPY WITH PROPOFOL;  Surgeon: Doran Stabler, MD;  Location: [facility];  Service: Gastroenterology;  Laterality: N/A;  . DILATION AND CURETTAGE OF UTERUS    . THYROIDECTOMY    . TONSILLECTOMY     Current Outpatient Medications on File Prior to Visit  Medication Sig Dispense Refill  . ALPRAZolam (XANAX) 0.5 MG tablet Take 1 tablet (0.5 mg total) by mouth 3 (three) times daily as needed. 30 tablet 0  . budesonide-formoterol (SYMBICORT) 160-4.5 MCG/ACT inhaler Inhale 2 puffs into the lungs 2 (two) times daily. 1 Inhaler 3  . diclofenac Sodium (VOLTAREN) 1 % GEL Apply 2 g topically 4 (four) times daily. 100 g 3  . FLUoxetine (PROZAC) 20 MG capsule TAKE 1 CAPSULE BY MOUTH EVERY DAY 90 capsule 2  . fluticasone (FLONASE) 50 MCG/ACT nasal spray SPRAY 2 SPRAYS INTO EACH NOSTRIL EVERY DAY 16 g 2  . gabapentin (NEURONTIN) 300 MG capsule Take 300 mg by mouth at bedtime.    Marland Kitchen HYDROcodone-acetaminophen (NORCO) 5-325 MG tablet Take 1 tablet by mouth every 6 (six) hours as needed for moderate pain. 30 tablet 0  . levocetirizine (XYZAL) 5 MG tablet TAKE 1 TABLET BY MOUTH EVERY DAY IN THE  EVENING 90 tablet 3  . levothyroxine (SYNTHROID) 50 MCG tablet TAKE 1 TABLET BY MOUTH DAILY BEFORE BREAKFAST 90 tablet 3  . meclizine (ANTIVERT) 25 MG tablet Take 1 tablet (25 mg total) by mouth 3 (three) times daily as needed for dizziness. 30 tablet 0  . Propylene Glycol-Glycerin (SOOTHE) 0.6-0.6 % SOLN Apply to eye.    . rosuvastatin (CRESTOR) 5 MG tablet TAKE 1 TABLET BY MOUTH EVERY DAY 90 tablet 3  . Tiotropium Bromide Monohydrate (SPIRIVA RESPIMAT) 2.5 MCG/ACT AERS Inhale 1 Inhaler into the lungs daily. 1 g 3  . tiZANidine (ZANAFLEX) 4 MG tablet TAKE 1 TABLET (4 MG TOTAL) BY MOUTH EVERY 6 (SIX) HOURS AS NEEDED FOR MUSCLE SPASMS. 30 tablet 0  . verapamil (CALAN-SR) 120 MG CR tablet Take 1 tablet (120 mg total) by mouth daily. Please keep upcoming appt in March with Dr. Tamala Julian for future refills. Thank you 90 tablet 0   No current facility-administered medications on file prior to visit.   Allergies  Allergen Reactions  . Meloxicam Other (See Comments)    BAD DREAMS, SAD THOUGHTS   Social History   Socioeconomic History  . Marital status: Widowed    Spouse name: Not on file  . Number of children: 0  . Years of education: Not on file  . Highest education level: Not on file  Occupational History    Employer: [employer]  Tobacco Use  . Smoking status: Former Smoker    Quit date: 07/24/2005    Years since quitting: 13.6  . Smokeless tobacco: Never Used  Substance and Sexual Activity  . Alcohol use: Yes    Comment: rare  . Drug use: No  . Sexual activity: Not on file  Other Topics Concern  . Not on file  Social History Narrative  . Not on file   Social Determinants of Health   Financial Resource Strain:   . Difficulty of Paying Living Expenses: Not on file  Food Insecurity:   . Worried About Charity fundraiser in the Last Year: Not on file  . Ran Out of Food in the Last Year: Not on file  Transportation Needs:   . Lack of Transportation (Medical): Not on  file  . Lack of Transportation (Non-Medical): Not on file  Physical Activity:   . Days of Exercise per Week: Not on file  . Minutes of Exercise per Session: Not on file  Stress:   . Feeling of Stress : Not on file  Social Connections:   . Frequency of Communication with Friends and Family: Not on file  . Frequency of Social Gatherings with Friends and Family: Not on file  . Attends Religious Services: Not on file  . Active Member of Clubs or Organizations: Not on file  . Attends Archivist Meetings: Not on file  . Marital Status: Not on file  Intimate Partner Violence:   . Fear of Current or Ex-Partner: Not on file  . Emotionally Abused: Not on file  . Physically Abused: Not on file  . Sexually Abused: Not on file      Review of Systems  All other systems reviewed and are negative.      Objective:   Physical Exam Vitals reviewed.  Constitutional:      General: She is not in acute distress.    Appearance: She is well-developed. She is not diaphoretic.  Neck:     Vascular: No JVD.  Cardiovascular:     Rate and Rhythm: Normal rate and regular rhythm.     Heart sounds: Normal heart sounds.  Pulmonary:     Effort: Pulmonary effort is normal. No respiratory distress.     Breath sounds: Normal breath sounds. No stridor. No wheezing or rales.  Chest:     Chest wall: No tenderness.  Abdominal:     General: Bowel sounds are normal.     Palpations: Abdomen is soft.  Musculoskeletal:     Cervical back: Neck supple.           Assessment & Plan:  Pure hypercholesterolemia - Plan: CBC with Differential/Platelet, COMPLETE METABOLIC PANEL WITH GFR, Lipid panel  Hypothyroidism, unspecified type - Plan: TSH  Chronic bronchitis, unspecified chronic bronchitis type ([place])  Her physical exam today is normal.  I have recommended that she discuss with her cardiologist possibly weaning down or discontinuing verapamil to see if this helps for dizziness.  She is no longer  taking gabapentin and I do not feel that any of her other medications could be causing disequilibrium.  Regarding her hypothyroidism I would like her to return fasting so that I can check a TSH to ensure adequate dosage of her levothyroxine.  Regarding her hyperlipidemia I will check a fasting lipid panel and a CMP.  Ideally I like her LDL cholesterol to be below 100.  The remainder of her preventative care is up-to-date.  I suspect the episode in her left hand was a trigger finger.  At the present time it has resolved completely and I would only simply monitor it for any recurrence.

## 2019-03-12 ENCOUNTER — Other Ambulatory Visit: Payer: Medicare HMO

## 2019-03-12 ENCOUNTER — Encounter: Payer: Self-pay | Admitting: *Deleted

## 2019-03-12 ENCOUNTER — Other Ambulatory Visit: Payer: Self-pay | Admitting: *Deleted

## 2019-03-12 ENCOUNTER — Other Ambulatory Visit: Payer: Self-pay

## 2019-03-12 ENCOUNTER — Telehealth: Payer: Self-pay | Admitting: *Deleted

## 2019-03-12 DIAGNOSIS — Z86010 Personal history of colonic polyps: Secondary | ICD-10-CM

## 2019-03-12 DIAGNOSIS — E039 Hypothyroidism, unspecified: Secondary | ICD-10-CM | POA: Diagnosis not present

## 2019-03-12 DIAGNOSIS — E7800 Pure hypercholesterolemia, unspecified: Secondary | ICD-10-CM | POA: Diagnosis not present

## 2019-03-12 NOTE — Telephone Encounter (Addendum)
Spoke to the patient who has been scheduled for the following:    04/02/19 at 2 pm pre-visit with nurse  04/14/19 at 9:45 am (start time) colon with [facility] at [facility] (Case ID: [id number])  Patient previously tested positive for COVID-19 (results in Epic: 02/13/19). No need to be retested, it is within the 90 day time frame. Endo charge nurse called and notified of the patients previously positive results.   Letter mailed to the patient.

## 2019-03-13 LAB — CBC WITH DIFFERENTIAL/PLATELET
Absolute Monocytes: 1008 cells/uL — ABNORMAL HIGH (ref 200–950)
Basophils Absolute: 7 cells/uL (ref 0–200)
Basophils Relative: 0.1 %
Eosinophils Absolute: 99 cells/uL (ref 15–500)
Eosinophils Relative: 1.4 %
HCT: 41.8 % (ref 35.0–45.0)
Hemoglobin: 13.8 g/dL (ref 11.7–15.5)
Lymphs Abs: 1598 cells/uL (ref 850–3900)
MCH: 28.5 pg (ref 27.0–33.0)
MCHC: 33 g/dL (ref 32.0–36.0)
MCV: 86.2 fL (ref 80.0–100.0)
MPV: 9.1 fL (ref 7.5–12.5)
Monocytes Relative: 14.2 %
Neutro Abs: 4388 cells/uL (ref 1500–7800)
Neutrophils Relative %: 61.8 %
Platelets: 345 10*3/uL (ref 140–400)
RBC: 4.85 10*6/uL (ref 3.80–5.10)
RDW: 13.4 % (ref 11.0–15.0)
Total Lymphocyte: 22.5 %
WBC: 7.1 10*3/uL (ref 3.8–10.8)

## 2019-03-13 LAB — COMPLETE METABOLIC PANEL WITH GFR
AG Ratio: 1.3 (calc) (ref 1.0–2.5)
ALT: 16 U/L (ref 6–29)
AST: 20 U/L (ref 10–35)
Albumin: 3.7 g/dL (ref 3.6–5.1)
Alkaline phosphatase (APISO): 77 U/L (ref 37–153)
BUN: 9 mg/dL (ref 7–25)
CO2: 26 mmol/L (ref 20–32)
Calcium: 9.1 mg/dL (ref 8.6–10.4)
Chloride: 106 mmol/L (ref 98–110)
Creat: 0.74 mg/dL (ref 0.60–0.93)
GFR, Est African American: 92 mL/min/{1.73_m2} (ref >=60)
GFR, Est Non African American: 80 mL/min/{1.73_m2} (ref >=60)
Globulin: 2.8 g/dL (calc) (ref 1.9–3.7)
Glucose, Bld: 87 mg/dL (ref 65–99)
Potassium: 4.5 mmol/L (ref 3.5–5.3)
Sodium: 142 mmol/L (ref 135–146)
Total Bilirubin: 0.4 mg/dL (ref 0.2–1.2)
Total Protein: 6.5 g/dL (ref 6.1–8.1)

## 2019-03-13 LAB — LIPID PANEL
Cholesterol: 164 mg/dL (ref <200)
HDL: 52 mg/dL (ref >=50)
LDL Cholesterol (Calc): 89 mg/dL (calc)
Non-HDL Cholesterol (Calc): 112 mg/dL (calc) (ref <130)
Total CHOL/HDL Ratio: 3.2 (calc) (ref <5.0)
Triglycerides: 125 mg/dL (ref <150)

## 2019-03-13 LAB — TSH: TSH: 3.53 mIU/L (ref 0.40–4.50)

## 2019-03-19 ENCOUNTER — Other Ambulatory Visit: Payer: Self-pay | Admitting: Family Medicine

## 2019-03-19 NOTE — Telephone Encounter (Signed)
Patient is requesting a refill on Hydrocodone   LOV: 03/10/19  LRF:   01/31/19   Also wanted to know if she needs to continue the Fluoxetine? She has stopped the Gabapentin.   Also she is scheduled for colonoscopy on 3/22 and she wanted you to know that she was going to reschedule that because she wanted to wait until 90 days after the infusion.

## 2019-03-20 MED ORDER — HYDROCODONE-ACETAMINOPHEN 5-325 MG PO TABS: 1.0000 | ORAL_TABLET | Freq: Four times a day (QID) | ORAL | 0 refills | Status: DC | PRN

## 2019-03-23 DIAGNOSIS — J449 Chronic obstructive pulmonary disease, unspecified: Secondary | ICD-10-CM | POA: Diagnosis not present

## 2019-03-24 NOTE — Telephone Encounter (Signed)
Dr. Loletha Carrow,   Be advised that I spoke with the patient who wants to cancel her colon on 3/22. On 1/27 she had a bamlanivimab infusion (tx of COVID?) and her PCP told her she could not be COVID vaccinated until 90 days post infection. The patient took this to mean she also could not have procedures as well. I told the patient this was not the case and tried to convince her to keep her appt. She refused. I also tried to move her to your hospital day on 4/19, she refused that day as well.   Vivien Rota,   Case ID: for rescheduling [id number], patient previously COVID positive on 1/21, [facility] endo made aware since she was being scheduled w/i 90 day of positive test, she would not be retested at the time. She will most likely need to be re-screened being that she may call to reschedule outside of the 90 day window.

## 2019-03-24 NOTE — Telephone Encounter (Signed)
Pt requested to cancel hospital colonoscopy 04/14/19.  She stated that she just had infusion and would prefer to wait for now.

## 2019-03-24 NOTE — Telephone Encounter (Signed)
Understood, thanks.    Sounds like you have given her all the required advice, and she can contact us when she feels ready to proceed with the colonoscopy.  It is not urgent - for history of colon polyps.

## 2019-03-25 ENCOUNTER — Ambulatory Visit (INDEPENDENT_AMBULATORY_CARE_PROVIDER_SITE_OTHER): Payer: Medicare HMO | Admitting: Family Medicine

## 2019-03-25 ENCOUNTER — Encounter: Payer: Self-pay | Admitting: Interventional Cardiology

## 2019-03-25 ENCOUNTER — Other Ambulatory Visit: Payer: Self-pay

## 2019-03-25 ENCOUNTER — Encounter: Payer: Self-pay | Admitting: Family Medicine

## 2019-03-25 VITALS — BP 120/62 | HR 90 | Temp 96.4°F | Resp 16 | Ht 65.5 in | Wt 142.0 lb

## 2019-03-25 DIAGNOSIS — G609 Hereditary and idiopathic neuropathy, unspecified: Secondary | ICD-10-CM | POA: Diagnosis not present

## 2019-03-25 MED ORDER — DICLOFENAC SODIUM 75 MG PO TBEC: 75.0000 mg | DELAYED_RELEASE_TABLET | Freq: Two times a day (BID) | ORAL | 0 refills | Status: DC

## 2019-03-25 NOTE — Progress Notes (Signed)
Subjective:    Patient ID: Mary Blevins, female    DOB: 27-Jul-1945, 74 y.o.   MRN: [id number]  03/10/19 Patient battled COVID-19 earlier this year but she has recovered completely.  She denies any chest pain.  She denies any shortness of breath.  She denies any dyspnea on exertion.  She does have COPD but she is compliant in using her Symbicort 2 puffs inhaled twice daily.  She does report a cough productive of clear mucus most mornings however she denies any purulent sputum or chest pain.  She denies any pleurisy or fevers.  She has a history of hypothyroidism for which she takes levothyroxine 50 mcg a day.  She is overdue to check a TSH.  This was last checked in April 2020 and was therapeutic at that time however we need to ensure that she is on adequate dosage of levothyroxine.  She also has a history of hyperlipidemia for which she takes Crestor.  She denies any myalgias right upper quadrant pain.  She is due to recheck fasting lipid panel.  Her blood pressure today is slightly on the low side.  Patient does report episodic dizziness which sounds like lightheadedness due to cerebral hypoperfusion.  I question if maybe she should decrease or even discontinue her verapamil.  She has an appointment to see her cardiologist in March.  I have suggested that she discuss with him whether or not she needs to maintain this medication.  The present time her heart rate is well controlled.  Her blood pressure if anything is low and she denies any palpitations or tachycardia.  I am concerned that perhaps her dizziness could be due to low blood pressure.  She did recently have an episode where she developed a "cramp" in the center of her left hand.  Her third digit "spasm down".  It sounds like it was in a contracture and I suspect a trigger finger.  She has never had it since.  She has normal grip strength today.  She has normal range of motion of her MCP, PIP, and DIP joints.  03/25/19 Patient presents today  with 2 separate problems.  First she reports pain in her right knee.  The pain is located over the lateral joint line.  She states that it started 2 days ago.  It primarily aches and throbs at night while she is trying to sleep.  She denies any injury.  She denies any falls.  Walking does not make the pain worse.  The pain does not radiate.  She has not tried any kind of medication for the pain.  On examination today she has no laxity to varus or valgus stress.  She has a negative drawer sign.  She has a negative Apley grind.  There is no erythema or effusion.  She does have some mild tenderness to palpation over the lateral joint line.  Second issue the patient reports numbness in both of her feet.  She states that this is been a chronic problem.  She denies any pins or needles in her feet although her feet do feel cold at times.  She has excellent circulation in both feet with a 2/4 dorsalis pedis pulse and posterior tibialis pulse bilaterally.  She does have a history of low back pain.  However her MRI obtained in 2017 showed no significant spinal stenosis or nerve impingement.  Her lab work was recently checked in February that showed no evidence of diabetes and her last TSH was normal in  February of this year as well.  However I do not see that she has had a vitamin B12 level checked.  Therefore I have recommended that we check this to rule out B12 deficiency as a potential cause of her neuropathy.  She has also independently started taking back her gabapentin.  She denies any radiating pain shooting down her legs.  She denies any low back pain today.  Therefore I recommended against resuming gabapentin given the fact that she was just here for dizziness.  I explained to the patient that the gabapentin can often exacerbate dizziness. Past Medical History:  Diagnosis Date  . Arthritis   . COPD (chronic obstructive pulmonary disease) ([place])   . Elevated lipids   . Generalized headaches   . History of  pneumonia   . Hyperlipidemia   . Hypothyroidism   . MVP (mitral valve prolapse)    palpitations  . Osteopenia   . Seasonal allergies   . Sinusitis   . Tubular adenoma of colon    Past Surgical History:  Procedure Laterality Date  . COLONOSCOPY WITH PROPOFOL N/A 01/06/2016   Procedure: COLONOSCOPY WITH PROPOFOL;  Surgeon: Doran Stabler, MD;  Location: [facility];  Service: Gastroenterology;  Laterality: N/A;  . DILATION AND CURETTAGE OF UTERUS    . THYROIDECTOMY    . TONSILLECTOMY     Current Outpatient Medications on File Prior to Visit  Medication Sig Dispense Refill  . ALPRAZolam (XANAX) 0.5 MG tablet Take 1 tablet (0.5 mg total) by mouth 3 (three) times daily as needed. 30 tablet 0  . budesonide-formoterol (SYMBICORT) 160-4.5 MCG/ACT inhaler Inhale 2 puffs into the lungs 2 (two) times daily. 1 Inhaler 3  . diclofenac Sodium (VOLTAREN) 1 % GEL Apply 2 g topically 4 (four) times daily. 100 g 3  . FLUoxetine (PROZAC) 20 MG capsule TAKE 1 CAPSULE BY MOUTH EVERY DAY 90 capsule 2  . fluticasone (FLONASE) 50 MCG/ACT nasal spray SPRAY 2 SPRAYS INTO EACH NOSTRIL EVERY DAY 16 g 2  . gabapentin (NEURONTIN) 300 MG capsule Take 300 mg by mouth at bedtime.    Marland Kitchen HYDROcodone-acetaminophen (NORCO) 5-325 MG tablet Take 1 tablet by mouth every 6 (six) hours as needed for moderate pain. 30 tablet 0  . levocetirizine (XYZAL) 5 MG tablet TAKE 1 TABLET BY MOUTH EVERY DAY IN THE EVENING 90 tablet 3  . levothyroxine (SYNTHROID) 50 MCG tablet TAKE 1 TABLET BY MOUTH DAILY BEFORE BREAKFAST 90 tablet 3  . meclizine (ANTIVERT) 25 MG tablet Take 1 tablet (25 mg total) by mouth 3 (three) times daily as needed for dizziness. 30 tablet 0  . Propylene Glycol-Glycerin (SOOTHE) 0.6-0.6 % SOLN Apply to eye.    . rosuvastatin (CRESTOR) 5 MG tablet TAKE 1 TABLET BY MOUTH EVERY DAY 90 tablet 3  . Tiotropium Bromide Monohydrate (SPIRIVA RESPIMAT) 2.5 MCG/ACT AERS Inhale 1 Inhaler into the lungs daily. 1 g 3  .  tiZANidine (ZANAFLEX) 4 MG tablet TAKE 1 TABLET (4 MG TOTAL) BY MOUTH EVERY 6 (SIX) HOURS AS NEEDED FOR MUSCLE SPASMS. 30 tablet 0  . verapamil (CALAN-SR) 120 MG CR tablet Take 1 tablet (120 mg total) by mouth daily. Please keep upcoming appt in March with Dr. Tamala Julian for future refills. Thank you 90 tablet 0   No current facility-administered medications on file prior to visit.   Allergies  Allergen Reactions  . Meloxicam Other (See Comments)    BAD DREAMS, SAD THOUGHTS   Social History   Socioeconomic History  .  Marital status: Widowed    Spouse name: Not on file  . Number of children: 0  . Years of education: Not on file  . Highest education level: Not on file  Occupational History    Employer: [employer]  Tobacco Use  . Smoking status: Former Smoker    Quit date: 07/24/2005    Years since quitting: 13.6  . Smokeless tobacco: Never Used  Substance and Sexual Activity  . Alcohol use: Yes    Comment: rare  . Drug use: No  . Sexual activity: Not on file  Other Topics Concern  . Not on file  Social History Narrative  . Not on file   Social Determinants of Health   Financial Resource Strain:   . Difficulty of Paying Living Expenses: Not on file  Food Insecurity:   . Worried About Charity fundraiser in the Last Year: Not on file  . Ran Out of Food in the Last Year: Not on file  Transportation Needs:   . Lack of Transportation (Medical): Not on file  . Lack of Transportation (Non-Medical): Not on file  Physical Activity:   . Days of Exercise per Week: Not on file  . Minutes of Exercise per Session: Not on file  Stress:   . Feeling of Stress : Not on file  Social Connections:   . Frequency of Communication with Friends and Family: Not on file  . Frequency of Social Gatherings with Friends and Family: Not on file  . Attends Religious Services: Not on file  . Active Member of Clubs or Organizations: Not on file  . Attends Archivist Meetings: Not  on file  . Marital Status: Not on file  Intimate Partner Violence:   . Fear of Current or Ex-Partner: Not on file  . Emotionally Abused: Not on file  . Physically Abused: Not on file  . Sexually Abused: Not on file    Past Medical History:  Diagnosis Date  . Arthritis   . COPD (chronic obstructive pulmonary disease) ([place])   . Elevated lipids   . Generalized headaches   . History of pneumonia   . Hyperlipidemia   . Hypothyroidism   . MVP (mitral valve prolapse)    palpitations  . Osteopenia   . Seasonal allergies   . Sinusitis   . Tubular adenoma of colon    Past Surgical History:  Procedure Laterality Date  . COLONOSCOPY WITH PROPOFOL N/A 01/06/2016   Procedure: COLONOSCOPY WITH PROPOFOL;  Surgeon: Doran Stabler, MD;  Location: [facility];  Service: Gastroenterology;  Laterality: N/A;  . DILATION AND CURETTAGE OF UTERUS    . THYROIDECTOMY    . TONSILLECTOMY     Current Outpatient Medications on File Prior to Visit  Medication Sig Dispense Refill  . ALPRAZolam (XANAX) 0.5 MG tablet Take 1 tablet (0.5 mg total) by mouth 3 (three) times daily as needed. 30 tablet 0  . budesonide-formoterol (SYMBICORT) 160-4.5 MCG/ACT inhaler Inhale 2 puffs into the lungs 2 (two) times daily. 1 Inhaler 3  . diclofenac Sodium (VOLTAREN) 1 % GEL Apply 2 g topically 4 (four) times daily. 100 g 3  . FLUoxetine (PROZAC) 20 MG capsule TAKE 1 CAPSULE BY MOUTH EVERY DAY 90 capsule 2  . fluticasone (FLONASE) 50 MCG/ACT nasal spray SPRAY 2 SPRAYS INTO EACH NOSTRIL EVERY DAY 16 g 2  . gabapentin (NEURONTIN) 300 MG capsule Take 300 mg by mouth at bedtime.    Marland Kitchen HYDROcodone-acetaminophen (NORCO) 5-325 MG  tablet Take 1 tablet by mouth every 6 (six) hours as needed for moderate pain. 30 tablet 0  . levocetirizine (XYZAL) 5 MG tablet TAKE 1 TABLET BY MOUTH EVERY DAY IN THE EVENING 90 tablet 3  . levothyroxine (SYNTHROID) 50 MCG tablet TAKE 1 TABLET BY MOUTH DAILY BEFORE BREAKFAST 90 tablet 3  . meclizine  (ANTIVERT) 25 MG tablet Take 1 tablet (25 mg total) by mouth 3 (three) times daily as needed for dizziness. 30 tablet 0  . Propylene Glycol-Glycerin (SOOTHE) 0.6-0.6 % SOLN Apply to eye.    . rosuvastatin (CRESTOR) 5 MG tablet TAKE 1 TABLET BY MOUTH EVERY DAY 90 tablet 3  . Tiotropium Bromide Monohydrate (SPIRIVA RESPIMAT) 2.5 MCG/ACT AERS Inhale 1 Inhaler into the lungs daily. 1 g 3  . tiZANidine (ZANAFLEX) 4 MG tablet TAKE 1 TABLET (4 MG TOTAL) BY MOUTH EVERY 6 (SIX) HOURS AS NEEDED FOR MUSCLE SPASMS. 30 tablet 0  . verapamil (CALAN-SR) 120 MG CR tablet Take 1 tablet (120 mg total) by mouth daily. Please keep upcoming appt in March with Dr. Tamala Julian for future refills. Thank you 90 tablet 0   No current facility-administered medications on file prior to visit.   Allergies  Allergen Reactions  . Meloxicam Other (See Comments)    BAD DREAMS, SAD THOUGHTS   Social History   Socioeconomic History  . Marital status: Widowed    Spouse name: Not on file  . Number of children: 0  . Years of education: Not on file  . Highest education level: Not on file  Occupational History    Employer: [employer]  Tobacco Use  . Smoking status: Former Smoker    Quit date: 07/24/2005    Years since quitting: 13.6  . Smokeless tobacco: Never Used  Substance and Sexual Activity  . Alcohol use: Yes    Comment: rare  . Drug use: No  . Sexual activity: Not on file  Other Topics Concern  . Not on file  Social History Narrative  . Not on file   Social Determinants of Health   Financial Resource Strain:   . Difficulty of Paying Living Expenses: Not on file  Food Insecurity:   . Worried About Charity fundraiser in the Last Year: Not on file  . Ran Out of Food in the Last Year: Not on file  Transportation Needs:   . Lack of Transportation (Medical): Not on file  . Lack of Transportation (Non-Medical): Not on file  Physical Activity:   . Days of Exercise per Week: Not on file  . Minutes  of Exercise per Session: Not on file  Stress:   . Feeling of Stress : Not on file  Social Connections:   . Frequency of Communication with Friends and Family: Not on file  . Frequency of Social Gatherings with Friends and Family: Not on file  . Attends Religious Services: Not on file  . Active Member of Clubs or Organizations: Not on file  . Attends Archivist Meetings: Not on file  . Marital Status: Not on file  Intimate Partner Violence:   . Fear of Current or Ex-Partner: Not on file  . Emotionally Abused: Not on file  . Physically Abused: Not on file  . Sexually Abused: Not on file      Review of Systems  All other systems reviewed and are negative.      Objective:   Physical Exam Vitals reviewed.  Constitutional:      General:  She is not in acute distress.    Appearance: She is well-developed. She is not diaphoretic.  Neck:     Vascular: No JVD.  Cardiovascular:     Rate and Rhythm: Normal rate and regular rhythm.     Heart sounds: Normal heart sounds.  Pulmonary:     Effort: Pulmonary effort is normal. No respiratory distress.     Breath sounds: Normal breath sounds. No stridor. No wheezing or rales.  Chest:     Chest wall: No tenderness.  Abdominal:     General: Bowel sounds are normal.     Palpations: Abdomen is soft.  Musculoskeletal:     Cervical back: Neck supple.     Right knee: No swelling or effusion. Normal range of motion. Tenderness present over the lateral joint line. No medial joint line tenderness. No LCL laxity, MCL laxity or ACL laxity.           Assessment & Plan:  Idiopathic peripheral neuropathy - Plan: Vitamin B12  Knee pain is mild and has only been present for 2 days.  I suspect there may be a mild element of arthritis.  I recommended trying diclofenac 75 mg twice daily as needed for knee pain.  If pain does not improve or persist greater than 2 weeks I would recommend obtaining an x-ray of the knee to evaluate further.  I  reassured the patient that I see no evidence of peripheral vascular disease.  She was concerned that peripheral vascular disease was the reason why she was having numbness in her feet however her pulses today are excellent and her extremities seem warm and well perfused.  I suspect that she may have an element of peripheral neuropathy.  I will check a B12 level to rule out B12 deficiency as a potential cause for this however I explained to the patient that there are no good medications to treat numbness.  Gabapentin can treat painful peripheral neuropathy however at the present time she is not experiencing this and therefore I discouraged her from taking it unless she develops neuropathic pain.

## 2019-03-25 NOTE — Telephone Encounter (Signed)
error 

## 2019-03-26 LAB — VITAMIN B12: Vitamin B-12: 342 pg/mL (ref 200–1100)

## 2019-03-27 NOTE — Progress Notes (Signed)
Cardiology Office Note:    Date:  03/28/2019   ID:  Mary Blevins, DOB 1945/03/01, MRN [id number]  PCP:  [name], MD  Cardiologist:  Sinclair Grooms, MD   Referring MD: [name], MD   Chief Complaint  Patient presents with  . Follow-up    Palpitations  . Advice Only    PSVT  . Hypertension    History of Present Illness:    Mary Blevins is a 74 y.o. female with a hx of PSVT, COPD, elevated lipids, and remote history of mitral valve prolapse.  She has had no episodes of sustained arrhythmia.  She has noticed some variability in blood pressure, at times high.  When she measures blood pressure, it is usually first thing in the morning before she has had her antihypertensive therapy.  Today her blood pressure is 132/76 mmHg after taking the medicine approximately 2-1/2 hours ago.  She has not had syncope, lightheadedness, dizziness, or other complaints.  As noted under review of systems, she did have COVID-19 and received monoclonal antibody infusions.  She did well.  Past Medical History:  Diagnosis Date  . Arthritis   . COPD (chronic obstructive pulmonary disease) ([place])   . Elevated lipids   . Generalized headaches   . History of pneumonia   . Hyperlipidemia   . Hypothyroidism   . MVP (mitral valve prolapse)    palpitations  . Osteopenia   . Seasonal allergies   . Sinusitis   . Tubular adenoma of colon     Past Surgical History:  Procedure Laterality Date  . COLONOSCOPY WITH PROPOFOL N/A 01/06/2016   Procedure: COLONOSCOPY WITH PROPOFOL;  Surgeon: Doran Stabler, MD;  Location: [facility];  Service: Gastroenterology;  Laterality: N/A;  . DILATION AND CURETTAGE OF UTERUS    . THYROIDECTOMY    . TONSILLECTOMY      Current Medications: Current Meds  Medication Sig  . ALPRAZolam (XANAX) 0.5 MG tablet Take 1 tablet (0.5 mg total) by mouth 3 (three) times daily as needed.  . budesonide-formoterol (SYMBICORT) 160-4.5 MCG/ACT  inhaler Inhale 2 puffs into the lungs 2 (two) times daily.  . diclofenac (VOLTAREN) 75 MG EC tablet Take 1 tablet (75 mg total) by mouth 2 (two) times daily. As needed for pain  . fluticasone (FLONASE) 50 MCG/ACT nasal spray SPRAY 2 SPRAYS INTO EACH NOSTRIL EVERY DAY  . HYDROcodone-acetaminophen (NORCO) 5-325 MG tablet Take 1 tablet by mouth every 6 (six) hours as needed for moderate pain.  Marland Kitchen levocetirizine (XYZAL) 5 MG tablet TAKE 1 TABLET BY MOUTH EVERY DAY IN THE EVENING  . levothyroxine (SYNTHROID) 50 MCG tablet TAKE 1 TABLET BY MOUTH DAILY BEFORE BREAKFAST  . meclizine (ANTIVERT) 25 MG tablet Take 1 tablet (25 mg total) by mouth 3 (three) times daily as needed for dizziness.  Marland Kitchen Propylene Glycol-Glycerin (SOOTHE) 0.6-0.6 % SOLN Apply to eye.  . rosuvastatin (CRESTOR) 5 MG tablet TAKE 1 TABLET BY MOUTH EVERY DAY  . Tiotropium Bromide Monohydrate (SPIRIVA RESPIMAT) 2.5 MCG/ACT AERS Inhale 1 Inhaler into the lungs daily.  Marland Kitchen tiZANidine (ZANAFLEX) 4 MG tablet TAKE 1 TABLET (4 MG TOTAL) BY MOUTH EVERY 6 (SIX) HOURS AS NEEDED FOR MUSCLE SPASMS.  . verapamil (CALAN-SR) 120 MG CR tablet Take 1 tablet (120 mg total) by mouth daily. Please keep upcoming appt in March with Dr. Tamala Julian for future refills. Thank you     Allergies:   Meloxicam   Social History  Socioeconomic History  . Marital status: Widowed    Spouse name: Not on file  . Number of children: 0  . Years of education: Not on file  . Highest education level: Not on file  Occupational History    Employer: [employer]  Tobacco Use  . Smoking status: Former Smoker    Quit date: 07/24/2005    Years since quitting: 13.6  . Smokeless tobacco: Never Used  Substance and Sexual Activity  . Alcohol use: Yes    Comment: rare  . Drug use: No  . Sexual activity: Not on file  Other Topics Concern  . Not on file  Social History Narrative  . Not on file   Social Determinants of Health   Financial Resource Strain:   .  Difficulty of Paying Living Expenses: Not on file  Food Insecurity:   . Worried About Charity fundraiser in the Last Year: Not on file  . Ran Out of Food in the Last Year: Not on file  Transportation Needs:   . Lack of Transportation (Medical): Not on file  . Lack of Transportation (Non-Medical): Not on file  Physical Activity:   . Days of Exercise per Week: Not on file  . Minutes of Exercise per Session: Not on file  Stress:   . Feeling of Stress : Not on file  Social Connections:   . Frequency of Communication with Friends and Family: Not on file  . Frequency of Social Gatherings with Friends and Family: Not on file  . Attends Religious Services: Not on file  . Active Member of Clubs or Organizations: Not on file  . Attends Archivist Meetings: Not on file  . Marital Status: Not on file     Family History: The patient's family history includes Breast cancer in her maternal aunt; Dementia in her mother; Diabetes in her brother; Healthy in her father and sister; Heart attack in her maternal grandmother, mother, and another family member; Heart disease in her mother; Prostate cancer in her maternal grandfather. There is no history of Colon cancer, Esophageal cancer, Rectal cancer, or Stomach cancer.  ROS:   Please see the history of present illness.    Had COVID-19 in January 2021.  Received monoclonal antibody infusion.  Still has cough.  No chills or fever.  COVID-19 was identified by testing.  She was not symptomatic.  She was taking care of an hot who had Covid.  Her aunt was hospitalized but has done okay.  All other systems reviewed and are negative.  EKGs/Labs/Other Studies Reviewed:    The following studies were reviewed today: No new data  EKG:  EKG sinus rhythm, short PR interval, poor R wave progression V1 through V4, occasional PVC, and when compared to prior tracings from January 2020, poor R wave progression is now present.  Recent Labs: 03/12/2019: ALT 16;  BUN 9; Creat 0.74; Hemoglobin 13.8; Platelets 345; Potassium 4.5; Sodium 142; TSH 3.53  Recent Lipid Panel    Component Value Date/Time   CHOL 164 03/12/2019 0837   CHOL 145 07/30/2017 0804   TRIG 125 03/12/2019 0837   HDL 52 03/12/2019 0837   HDL 59 07/30/2017 0804   CHOLHDL 3.2 03/12/2019 0837   VLDL 44 (H) 12/14/2014 1046   LDLCALC 89 03/12/2019 0837    Physical Exam:    VS:  BP 132/76   Pulse 79   Ht 5' 5.5" (1.664 m)   Wt 144 lb 6.4 oz (65.5 kg)  SpO2 97%   BMI 23.66 kg/m     Wt Readings from Last 3 Encounters:  03/28/19 144 lb 6.4 oz (65.5 kg)  03/25/19 142 lb (64.4 kg)  03/10/19 139 lb (63 kg)     GEN: Compatible with age. No acute distress HEENT: Normal NECK: No JVD. LYMPHATICS: No lymphadenopathy CARDIAC:  RRR without murmur, gallop, or edema. VASCULAR:  Normal Pulses. No bruits. RESPIRATORY:  Clear to auscultation without rales, wheezing or rhonchi  ABDOMEN: Soft, non-tender, non-distended, No pulsatile mass, MUSCULOSKELETAL: No deformity  SKIN: Warm and dry NEUROLOGIC:  Alert and oriented x 3 PSYCHIATRIC:  Normal affect   ASSESSMENT:    1. PSVT (paroxysmal supraventricular tachycardia) ([place])   2. Chronic obstructive pulmonary disease, unspecified COPD type ([place])   3. Aortic atherosclerosis ([facility])   4. Other hyperlipidemia   5. Educated about COVID-19 virus infection   6. Essential hypertension    PLAN:    In order of problems listed above:  1. Controlled with verapamil.  She does have relatively short PR interval.  Continue current therapy with verapamil which is being used to treat blood pressure as well. 2. Despite COPD, she got over COVID-19 without severe disease. 3. Needs tight primary prevention including LDL less than 70.  Most recent LDL cholesterol was 89.  Rosuvastatin should probably be increased to 10 mg/day at the next opportunity. 4. Increase rosuvastatin dose at the next opportunity to 10 mg/day. 5. She developed COVID-19  infection.  She did not have severe disease.  She received monoclonal antibody infusion. 6. Low-salt diet, monitor blood pressure anywhere from 2 to 6 hours after morning dose of verapamil.  Target less than 140/90 mmHg with ideal target 130/80 mmHg or less.  Overall education and awareness concerning primary/secondary risk prevention was discussed in detail: LDL less than 70, hemoglobin A1c less than 7, blood pressure target less than 130/80 mmHg, >150 minutes of moderate aerobic activity per week, avoidance of smoking, weight control (via diet and exercise), and continued surveillance/management of/for obstructive sleep apnea.    Medication Adjustments/Labs and Tests Ordered: Current medicines are reviewed at length with the patient today.  Concerns regarding medicines are outlined above.  Orders Placed This Encounter  Procedures  . EKG 12-Lead   No orders of the defined types were placed in this encounter.   Patient Instructions  Medication Instructions:  Your physician recommends that you continue on your current medications as directed. Please refer to the Current Medication list given to you today.  *If you need a refill on your cardiac medications before your next appointment, please call your pharmacy*   Lab Work: None If you have labs (blood work) drawn today and your tests are completely normal, you will receive your results only by: Marland Kitchen MyChart Message (if you have MyChart) OR . A paper copy in the mail If you have any lab test that is abnormal or we need to change your treatment, we will call you to review the results.   Testing/Procedures: None   Follow-Up: At [facility], you and your health needs are our priority.  As part of our continuing mission to provide you with exceptional heart care, we have created designated Provider Care Teams.  These Care Teams include your primary Cardiologist (physician) and Advanced Practice Providers (APPs -  Physician Assistants  and Nurse Practitioners) who all work together to provide you with the care you need, when you need it.  We recommend signing up for the patient portal called "MyChart".  Sign up information is provided on this After Visit Summary.  MyChart is used to connect with patients for Virtual Visits (Telemedicine).  Patients are able to view lab/test results, encounter notes, upcoming appointments, etc.  Non-urgent messages can be sent to your provider as well.   To learn more about what you can do with MyChart, go to [email or web address].    Your next appointment:   12 month(s)  The format for your next appointment:   In Person  Provider:   You may see Sinclair Grooms, MD or one of the following Advanced Practice Providers on your designated Care Team:    Truitt Merle, NP  Cecilie Kicks, NP  Kathyrn Drown, NP    Other Instructions      Signed, Sinclair Grooms, MD  03/28/2019 9:09 AM    [place]

## 2019-03-28 ENCOUNTER — Other Ambulatory Visit: Payer: Self-pay

## 2019-03-28 ENCOUNTER — Ambulatory Visit (INDEPENDENT_AMBULATORY_CARE_PROVIDER_SITE_OTHER): Payer: Medicare HMO | Admitting: Interventional Cardiology

## 2019-03-28 ENCOUNTER — Encounter: Payer: Self-pay | Admitting: Interventional Cardiology

## 2019-03-28 VITALS — BP 132/76 | HR 79 | Ht 65.5 in | Wt 144.4 lb

## 2019-03-28 DIAGNOSIS — Z7189 Other specified counseling: Secondary | ICD-10-CM | POA: Diagnosis not present

## 2019-03-28 DIAGNOSIS — I700 Atherosclerosis of aorta: Secondary | ICD-10-CM

## 2019-03-28 DIAGNOSIS — E7849 Other hyperlipidemia: Secondary | ICD-10-CM | POA: Diagnosis not present

## 2019-03-28 DIAGNOSIS — I10 Essential (primary) hypertension: Secondary | ICD-10-CM | POA: Diagnosis not present

## 2019-03-28 DIAGNOSIS — I471 Supraventricular tachycardia, unspecified: Secondary | ICD-10-CM

## 2019-03-28 DIAGNOSIS — J449 Chronic obstructive pulmonary disease, unspecified: Secondary | ICD-10-CM | POA: Diagnosis not present

## 2019-03-28 NOTE — Patient Instructions (Signed)

## 2019-04-08 ENCOUNTER — Telehealth: Payer: Self-pay | Admitting: Family Medicine

## 2019-04-08 NOTE — Telephone Encounter (Signed)
Pt aware of recommendations

## 2019-04-08 NOTE — Telephone Encounter (Signed)
Pt called and states that her R>L calf muscle is hurting, not real bad just a nagging pain. Wanted to know if she should continue the Diclofenac (that you told her not to take everyday, just as needed) or does she need to go back on her Gabapentin??

## 2019-04-08 NOTE — Telephone Encounter (Signed)
She can use diclofenac as needed

## 2019-04-14 ENCOUNTER — Telehealth: Payer: Self-pay | Admitting: Family Medicine

## 2019-04-14 ENCOUNTER — Encounter (HOSPITAL_COMMUNITY): Payer: Self-pay

## 2019-04-14 ENCOUNTER — Telehealth: Payer: Self-pay | Admitting: Adult Health

## 2019-04-14 ENCOUNTER — Ambulatory Visit (HOSPITAL_COMMUNITY): Admit: 2019-04-14 | Payer: Medicare HMO | Admitting: Gastroenterology

## 2019-04-14 SURGERY — COLONOSCOPY WITH PROPOFOL
Anesthesia: Monitor Anesthesia Care

## 2019-04-14 NOTE — Telephone Encounter (Signed)
Patient has questions about her infusions and getting covid vaccine [phone]

## 2019-04-14 NOTE — Telephone Encounter (Signed)
Pt wanted to know if she should wait 90 days after getting infusion to get the COVID shot. Per the infusion center she should wait the 90 days before the COVID shot and Dr. Dennard Schaumann is in agree with their recommendations.

## 2019-04-14 NOTE — Telephone Encounter (Signed)
Called patient and reviewed with her that I re-checked the CDC guidelines and recommendations.  The recommendation remains that she wait 90 days post COVID monoclonal antibody infusion before receiving the COVID vaccine. Patient voiced appreciation and thanks for my getting back to her.    Wilber Bihari, NP

## 2019-04-14 NOTE — Telephone Encounter (Signed)
Patient has questions about her infusions and covid vaccine  [phone]

## 2019-04-20 DIAGNOSIS — J449 Chronic obstructive pulmonary disease, unspecified: Secondary | ICD-10-CM | POA: Diagnosis not present

## 2019-04-29 ENCOUNTER — Other Ambulatory Visit: Payer: Self-pay

## 2019-04-29 ENCOUNTER — Encounter: Payer: Self-pay | Admitting: Family Medicine

## 2019-04-29 ENCOUNTER — Ambulatory Visit (INDEPENDENT_AMBULATORY_CARE_PROVIDER_SITE_OTHER): Payer: Medicare HMO | Admitting: Family Medicine

## 2019-04-29 VITALS — BP 110/70 | HR 94 | Temp 97.3°F | Resp 14 | Ht 65.5 in | Wt 150.0 lb

## 2019-04-29 DIAGNOSIS — M25561 Pain in right knee: Secondary | ICD-10-CM | POA: Diagnosis not present

## 2019-04-29 DIAGNOSIS — M25562 Pain in left knee: Secondary | ICD-10-CM

## 2019-04-29 NOTE — Progress Notes (Signed)
Subjective:    Patient ID: Mary Blevins, female    DOB: Dec 13, 1945, 74 y.o.   MRN: [id number] Patient reports bilateral knee pain left greater than right.  Her knees been hurting since March 26.  She has been taking diclofenac once a day with minimal relief.  The left knee is hurting so bad at times it feels like it has been a give out on her.  On examination today there is no effusion in either knee.  There is no erythema or warmth.  She does have significant crepitus with range of motion in the left knee but not in the right knee.  There is no laxity to varus or valgus stress bilaterally.  She has negative anterior and posterior drawer sign bilaterally.  She has a negative Apley grind bilaterally.  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 Past Medical History:  Diagnosis Date  . Arthritis   . COPD (chronic obstructive pulmonary disease) ([place])   . Elevated lipids   . Generalized headaches   . History of pneumonia   . Hyperlipidemia   . Hypothyroidism   . MVP (mitral valve prolapse)    palpitations  . Osteopenia   . Seasonal allergies   . Sinusitis   . Tubular adenoma of colon    Past Surgical History:  Procedure Laterality Date  . COLONOSCOPY WITH PROPOFOL N/A 01/06/2016   Procedure: COLONOSCOPY WITH PROPOFOL;  Surgeon: Doran Stabler, MD;  Location: [facility];  Service: Gastroenterology;  Laterality: N/A;  . DILATION AND CURETTAGE OF UTERUS    . THYROIDECTOMY    . TONSILLECTOMY     Current Outpatient Medications on File Prior to Visit  Medication Sig Dispense Refill  . ALPRAZolam (XANAX) 0.5 MG tablet Take 1 tablet (0.5 mg total) by mouth 3 (three) times daily as needed. 30 tablet 0  . budesonide-formoterol (SYMBICORT) 160-4.5 MCG/ACT inhaler Inhale 2 puffs into the lungs 2 (two) times daily. 1 Inhaler 3  . diclofenac (VOLTAREN) 75 MG EC tablet Take 1 tablet (75 mg total) by mouth 2 (two) times daily. As needed for  pain 30 tablet 0  . fluticasone (FLONASE) 50 MCG/ACT nasal spray SPRAY 2 SPRAYS INTO EACH NOSTRIL EVERY DAY 16 g 2  . HYDROcodone-acetaminophen (NORCO) 5-325 MG tablet Take 1 tablet by mouth every 6 (six) hours as needed for moderate pain. 30 tablet 0  . levocetirizine (XYZAL) 5 MG tablet TAKE 1 TABLET BY MOUTH EVERY DAY IN THE EVENING 90 tablet 3  . levothyroxine (SYNTHROID) 50 MCG tablet TAKE 1 TABLET BY MOUTH DAILY BEFORE BREAKFAST 90 tablet 3  . meclizine (ANTIVERT) 25 MG tablet Take 1 tablet (25 mg total) by mouth 3 (three) times daily as needed for dizziness. 30 tablet 0  . Propylene Glycol-Glycerin (SOOTHE) 0.6-0.6 % SOLN Apply to eye.    . rosuvastatin (CRESTOR) 5 MG tablet TAKE 1 TABLET BY MOUTH EVERY DAY 90 tablet 3  . Tiotropium Bromide Monohydrate (SPIRIVA RESPIMAT) 2.5 MCG/ACT AERS Inhale 1 Inhaler into the lungs daily. 1 g 3  . tiZANidine (ZANAFLEX) 4 MG tablet TAKE 1 TABLET (4 MG TOTAL) BY MOUTH EVERY 6 (SIX) HOURS AS NEEDED FOR MUSCLE SPASMS. 30 tablet 0  . verapamil (CALAN-SR) 120 MG CR tablet Take 1 tablet (120 mg total) by mouth daily. Please keep upcoming appt in March with Dr. Tamala Julian for future refills. Thank you 90 tablet 0   No current facility-administered medications on file prior to  visit.   Allergies  Allergen Reactions  . Meloxicam Other (See Comments)    BAD DREAMS, SAD THOUGHTS   Social History   Socioeconomic History  . Marital status: Widowed    Spouse name: Not on file  . Number of children: 0  . Years of education: Not on file  . Highest education level: Not on file  Occupational History    Employer: [employer]  Tobacco Use  . Smoking status: Former Smoker    Quit date: 07/24/2005    Years since quitting: 13.7  . Smokeless tobacco: Never Used  Substance and Sexual Activity  . Alcohol use: Yes    Comment: rare  . Drug use: No  . Sexual activity: Not on file  Other Topics Concern  . Not on file  Social History Narrative  . Not on  file   Social Determinants of Health   Financial Resource Strain:   . Difficulty of Paying Living Expenses:   Food Insecurity:   . Worried About Charity fundraiser in the Last Year:   . Arboriculturist in the Last Year:   Transportation Needs:   . Film/video editor (Medical):   Marland Kitchen Lack of Transportation (Non-Medical):   Physical Activity:   . Days of Exercise per Week:   . Minutes of Exercise per Session:   Stress:   . Feeling of Stress :   Social Connections:   . Frequency of Communication with Friends and Family:   . Frequency of Social Gatherings with Friends and Family:   . Attends Religious Services:   . Active Member of Clubs or Organizations:   . Attends Archivist Meetings:   Marland Kitchen Marital Status:   Intimate Partner Violence:   . Fear of Current or Ex-Partner:   . Emotionally Abused:   Marland Kitchen Physically Abused:   . Sexually Abused:     Past Medical History:  Diagnosis Date  . Arthritis   . COPD (chronic obstructive pulmonary disease) ([place])   . Elevated lipids   . Generalized headaches   . History of pneumonia   . Hyperlipidemia   . Hypothyroidism   . MVP (mitral valve prolapse)    palpitations  . Osteopenia   . Seasonal allergies   . Sinusitis   . Tubular adenoma of colon    Past Surgical History:  Procedure Laterality Date  . COLONOSCOPY WITH PROPOFOL N/A 01/06/2016   Procedure: COLONOSCOPY WITH PROPOFOL;  Surgeon: Doran Stabler, MD;  Location: [facility];  Service: Gastroenterology;  Laterality: N/A;  . DILATION AND CURETTAGE OF UTERUS    . THYROIDECTOMY    . TONSILLECTOMY     Current Outpatient Medications on File Prior to Visit  Medication Sig Dispense Refill  . ALPRAZolam (XANAX) 0.5 MG tablet Take 1 tablet (0.5 mg total) by mouth 3 (three) times daily as needed. 30 tablet 0  . budesonide-formoterol (SYMBICORT) 160-4.5 MCG/ACT inhaler Inhale 2 puffs into the lungs 2 (two) times daily. 1 Inhaler 3  . diclofenac (VOLTAREN) 75 MG EC  tablet Take 1 tablet (75 mg total) by mouth 2 (two) times daily. As needed for pain 30 tablet 0  . fluticasone (FLONASE) 50 MCG/ACT nasal spray SPRAY 2 SPRAYS INTO EACH NOSTRIL EVERY DAY 16 g 2  . HYDROcodone-acetaminophen (NORCO) 5-325 MG tablet Take 1 tablet by mouth every 6 (six) hours as needed for moderate pain. 30 tablet 0  . levocetirizine (XYZAL) 5 MG tablet TAKE 1 TABLET BY MOUTH  EVERY DAY IN THE EVENING 90 tablet 3  . levothyroxine (SYNTHROID) 50 MCG tablet TAKE 1 TABLET BY MOUTH DAILY BEFORE BREAKFAST 90 tablet 3  . meclizine (ANTIVERT) 25 MG tablet Take 1 tablet (25 mg total) by mouth 3 (three) times daily as needed for dizziness. 30 tablet 0  . Propylene Glycol-Glycerin (SOOTHE) 0.6-0.6 % SOLN Apply to eye.    . rosuvastatin (CRESTOR) 5 MG tablet TAKE 1 TABLET BY MOUTH EVERY DAY 90 tablet 3  . Tiotropium Bromide Monohydrate (SPIRIVA RESPIMAT) 2.5 MCG/ACT AERS Inhale 1 Inhaler into the lungs daily. 1 g 3  . tiZANidine (ZANAFLEX) 4 MG tablet TAKE 1 TABLET (4 MG TOTAL) BY MOUTH EVERY 6 (SIX) HOURS AS NEEDED FOR MUSCLE SPASMS. 30 tablet 0  . verapamil (CALAN-SR) 120 MG CR tablet Take 1 tablet (120 mg total) by mouth daily. Please keep upcoming appt in March with Dr. Tamala Julian for future refills. Thank you 90 tablet 0   No current facility-administered medications on file prior to visit.   Allergies  Allergen Reactions  . Meloxicam Other (See Comments)    BAD DREAMS, SAD THOUGHTS   Social History   Socioeconomic History  . Marital status: Widowed    Spouse name: Not on file  . Number of children: 0  . Years of education: Not on file  . Highest education level: Not on file  Occupational History    Employer: [employer]  Tobacco Use  . Smoking status: Former Smoker    Quit date: 07/24/2005    Years since quitting: 13.7  . Smokeless tobacco: Never Used  Substance and Sexual Activity  . Alcohol use: Yes    Comment: rare  . Drug use: No  . Sexual activity: Not on  file  Other Topics Concern  . Not on file  Social History Narrative  . Not on file   Social Determinants of Health   Financial Resource Strain:   . Difficulty of Paying Living Expenses:   Food Insecurity:   . Worried About Charity fundraiser in the Last Year:   . Arboriculturist in the Last Year:   Transportation Needs:   . Film/video editor (Medical):   Marland Kitchen Lack of Transportation (Non-Medical):   Physical Activity:   . Days of Exercise per Week:   . Minutes of Exercise per Session:   Stress:   . Feeling of Stress :   Social Connections:   . Frequency of Communication with Friends and Family:   . Frequency of Social Gatherings with Friends and Family:   . Attends Religious Services:   . Active Member of Clubs or Organizations:   . Attends Archivist Meetings:   Marland Kitchen Marital Status:   Intimate Partner Violence:   . Fear of Current or Ex-Partner:   . Emotionally Abused:   Marland Kitchen Physically Abused:   . Sexually Abused:       Review of Systems  All other systems reviewed and are negative.      Objective:   Physical Exam Vitals reviewed.  Constitutional:      General: She is not in acute distress.    Appearance: She is well-developed. She is not diaphoretic.  Neck:     Vascular: No JVD.  Cardiovascular:     Rate and Rhythm: Normal rate and regular rhythm.     Heart sounds: Normal heart sounds.  Pulmonary:     Effort: Pulmonary effort is normal. No respiratory distress.  Breath sounds: Normal breath sounds. No stridor. No wheezing or rales.  Chest:     Chest wall: No tenderness.  Abdominal:     General: Bowel sounds are normal.     Palpations: Abdomen is soft.  Musculoskeletal:     Cervical back: Neck supple.     Right knee: No swelling, effusion or crepitus. Normal range of motion. Tenderness present over the medial joint line and lateral joint line. No LCL laxity, MCL laxity or ACL laxity.     Left knee: Crepitus present. No swelling or effusion.  Normal range of motion. Tenderness present over the medial joint line and lateral joint line.           Assessment & Plan:  Acute pain of both knees  I suspect osteoarthritis causing her knee pain.  She would like to receive a cortisone injection in her left knee.  Using sterile technique, I injected the left knee with 2 cc lidocaine, 2 cc of Marcaine, 2 cc of 40 mg/mL Kenalog.  The patient tolerated procedure well without complication.  Continue to take diclofenac to calm down the pain in her RIGHT knee.

## 2019-04-30 DIAGNOSIS — H40003 Preglaucoma, unspecified, bilateral: Secondary | ICD-10-CM | POA: Diagnosis not present

## 2019-04-30 DIAGNOSIS — H40013 Open angle with borderline findings, low risk, bilateral: Secondary | ICD-10-CM | POA: Diagnosis not present

## 2019-05-01 ENCOUNTER — Telehealth: Payer: Self-pay | Admitting: General Practice

## 2019-05-02 DIAGNOSIS — H40013 Open angle with borderline findings, low risk, bilateral: Secondary | ICD-10-CM | POA: Diagnosis not present

## 2019-05-02 DIAGNOSIS — H2513 Age-related nuclear cataract, bilateral: Secondary | ICD-10-CM | POA: Diagnosis not present

## 2019-05-02 DIAGNOSIS — H04123 Dry eye syndrome of bilateral lacrimal glands: Secondary | ICD-10-CM | POA: Diagnosis not present

## 2019-05-04 ENCOUNTER — Other Ambulatory Visit: Payer: Self-pay | Admitting: Family Medicine

## 2019-05-04 ENCOUNTER — Other Ambulatory Visit: Payer: Self-pay | Admitting: Interventional Cardiology

## 2019-05-06 ENCOUNTER — Telehealth: Payer: Self-pay | Admitting: Family Medicine

## 2019-05-06 NOTE — Telephone Encounter (Signed)
Pt received a jury summons and would like a note stating that she can not serve d/t her medical issues.  Also pt would like a knee brace as she states it is "giving her a fit". Please advise.

## 2019-05-08 ENCOUNTER — Encounter: Payer: Self-pay | Admitting: Family Medicine

## 2019-05-08 NOTE — Telephone Encounter (Signed)
Letter is on my desk.  She can buy a knee brace otc if she wants.

## 2019-05-09 NOTE — Telephone Encounter (Signed)
Pt aware.

## 2019-05-21 DIAGNOSIS — J449 Chronic obstructive pulmonary disease, unspecified: Secondary | ICD-10-CM | POA: Diagnosis not present

## 2019-05-22 DIAGNOSIS — M1711 Unilateral primary osteoarthritis, right knee: Secondary | ICD-10-CM | POA: Diagnosis not present

## 2019-05-22 DIAGNOSIS — M1712 Unilateral primary osteoarthritis, left knee: Secondary | ICD-10-CM | POA: Diagnosis not present

## 2019-05-22 DIAGNOSIS — M170 Bilateral primary osteoarthritis of knee: Secondary | ICD-10-CM | POA: Diagnosis not present

## 2019-05-23 DIAGNOSIS — H524 Presbyopia: Secondary | ICD-10-CM | POA: Diagnosis not present

## 2019-05-23 DIAGNOSIS — H40013 Open angle with borderline findings, low risk, bilateral: Secondary | ICD-10-CM | POA: Diagnosis not present

## 2019-05-23 DIAGNOSIS — H2513 Age-related nuclear cataract, bilateral: Secondary | ICD-10-CM | POA: Diagnosis not present

## 2019-05-27 ENCOUNTER — Other Ambulatory Visit: Payer: Self-pay | Admitting: Family Medicine

## 2019-05-27 MED ORDER — HYDROCODONE-ACETAMINOPHEN 5-325 MG PO TABS: 1.0000 | ORAL_TABLET | Freq: Four times a day (QID) | ORAL | 0 refills | Status: DC | PRN

## 2019-05-27 NOTE — Telephone Encounter (Signed)
Patient is requesting a refill on Hydrocodone   LOV: 04/29/2019  LRF:  03/20/2019

## 2019-06-02 ENCOUNTER — Other Ambulatory Visit (HOSPITAL_COMMUNITY)
Admission: RE | Admit: 2019-06-02 | Discharge: 2019-06-02 | Disposition: A | Discharge status: Home / Self Care | Payer: Medicare HMO | Source: Ambulatory Visit | Attending: Pulmonary Disease | Admitting: Pulmonary Disease

## 2019-06-02 ENCOUNTER — Encounter: Payer: Self-pay | Admitting: Pulmonary Disease

## 2019-06-02 ENCOUNTER — Ambulatory Visit (INDEPENDENT_AMBULATORY_CARE_PROVIDER_SITE_OTHER): Payer: Medicare HMO | Admitting: Pulmonary Disease

## 2019-06-02 ENCOUNTER — Other Ambulatory Visit: Payer: Self-pay

## 2019-06-02 VITALS — BP 140/80 | HR 75 | Temp 97.6°F | Ht 65.5 in | Wt 149.0 lb

## 2019-06-02 DIAGNOSIS — J432 Centrilobular emphysema: Secondary | ICD-10-CM | POA: Diagnosis not present

## 2019-06-02 NOTE — Progress Notes (Signed)
[facility]  Chief Complaint  Patient presents with  . Consult    Patient is here for COPD. Patient states that since last visit her breathing is about the same. Has shortness of breath with exertion. Denies cough    Constitutional:  BP 140/80 (BP Location: Left Arm, Patient Position: Sitting, Cuff Size: Normal)   Pulse 75   Temp 97.6 F (36.4 C) (Temporal)   Ht 5' 5.5" (1.664 m)   Wt 149 lb (67.6 kg)   SpO2 100%   BMI 24.42 kg/m   Past Medical History:  HLD, HA, PNA, Hypothyroidism, Osteopenia, Allergies, Colon polyp, COVID 19 infection January 2021, Anxiety, Cataracts  Summary:  Mary Blevins is a 74 y.o. female former smoker with COPD/emphysema.  Subjective:   Previously seen by Dr. Luan Pulling.  Was told she had COPD in 2002.  Quit smoking years ago.  Smoked 1 pack per day.  She was dx with COVID 19 in January 2021.  Treated with monoclonal antibody and had tremendous improvement.  Breathing has been doing well.  Occasional gets wheeze and sputum.  Has been using mucinex and this helps.  Uses spiriva and symbicort daily.  Tried trelegy before, but didn't help.  Uses albuterol 0 to 2 times per day.  Exercises regularly at home.  She got steroid knee injection recently.    Planning to get COVID vaccine in June.  Last CXR from 01/06/18 (reviewed by me) showed atelectasis.     Physical Exam:   Appearance - well kempt  ENMT - no sinus tenderness, no nasal discharge, no oral exudate, Mallampati 2  Respiratory - no wheeze, or rales  CV - regular rate and rhythm, no murmurs  GI - soft, non tender  Lymph - no adenopathy noted in neck  Ext - no edema  Skin - no rashes  Neuro - normal strength, oriented x 3  Psych - normal mood and affect   Assessment/Plan:   COPD with emphysema. - continue spiriva, symbicort and prn albuterol - she will call next month if she would like to try breztri to see if this works as  well and allows cost savings - she tried trelegy previously, but didn't seem to help - prn mucinex to help with expectoration - check A1AT level - she will check with her PCP about when she is due for Pneumovax booster - encouraged her to keep up her exercise routine  Allergic rhinitis. - continue xyzal, flonase prn  History of COVID 19 infection from January 2021. - treated with monoclonal antibody therapy - she will get COVID 19 vaccine in June 2021  A total of  34 minutes spent addressing patient care issues on day of visit.   Follow up:  Patient Instructions  Lab test today to check for inherited form of emphysema  Check with your primary care doctor about when you need a booster pneumonia shot  Call when you need refill on inhalers and let us know if you want to change to [facility] in place of Spiriva and Symbicort  Follow up in 1 year    Signature:  Chesley Mires, MD [place] Pager: [phone] 06/02/2019, 9:34 AM  Flow Sheet     Pulmonary tests:  PFT 03/01/17 >> FEV1 0.99 (51%), FEV1% 49, TLC 6.41 (121%), DLCO 51%, no BD  Medications:   Allergies as of 06/02/2019      Reactions   Meloxicam Other (See Comments)   BAD DREAMS, SAD THOUGHTS  Medication List       Accurate as of Jun 02, 2019  9:34 AM. If you have any questions, ask your nurse or doctor.        ALPRAZolam 0.5 MG tablet Commonly known as: Xanax Take 1 tablet (0.5 mg total) by mouth 3 (three) times daily as needed.   budesonide-formoterol 160-4.5 MCG/ACT inhaler Commonly known as: SYMBICORT Inhale 2 puffs into the lungs 2 (two) times daily.   diclofenac 75 MG EC tablet Commonly known as: VOLTAREN TAKE 1 TABLET (75 MG TOTAL) BY MOUTH 2 (TWO) TIMES DAILY. AS NEEDED FOR PAIN   fluticasone 50 MCG/ACT nasal spray Commonly known as: FLONASE SPRAY 2 SPRAYS INTO EACH NOSTRIL EVERY DAY   HYDROcodone-acetaminophen 5-325 MG tablet Commonly known as: Norco Take 1  tablet by mouth every 6 (six) hours as needed for moderate pain.   levocetirizine 5 MG tablet Commonly known as: XYZAL TAKE 1 TABLET BY MOUTH EVERY DAY IN THE EVENING   levothyroxine 50 MCG tablet Commonly known as: SYNTHROID TAKE 1 TABLET BY MOUTH DAILY BEFORE BREAKFAST   meclizine 25 MG tablet Commonly known as: ANTIVERT Take 1 tablet (25 mg total) by mouth 3 (three) times daily as needed for dizziness.   rosuvastatin 5 MG tablet Commonly known as: CRESTOR TAKE 1 TABLET BY MOUTH EVERY DAY   Soothe 0.6-0.6 % Soln Generic drug: Propylene Glycol-Glycerin Apply to eye.   Spiriva Respimat 2.5 MCG/ACT Aers Generic drug: Tiotropium Bromide Monohydrate Inhale 1 Inhaler into the lungs daily.   tiZANidine 4 MG tablet Commonly known as: ZANAFLEX TAKE 1 TABLET (4 MG TOTAL) BY MOUTH EVERY 6 (SIX) HOURS AS NEEDED FOR MUSCLE SPASMS.   verapamil 120 MG CR tablet Commonly known as: CALAN-SR Take 1 tablet (120 mg total) by mouth daily.       Past Surgical History:  She  has a past surgical history that includes Thyroidectomy; Dilation and curettage of uterus; Tonsillectomy; and Colonoscopy with propofol (N/A, 01/06/2016).  Family History:  Her family history includes Breast cancer in her maternal aunt; Dementia in her mother; Diabetes in her brother; Healthy in her father and sister; Heart attack in her maternal grandmother, mother, and another family member; Heart disease in her mother; Prostate cancer in her maternal grandfather.  Social History:  She  reports that she quit smoking about 13 years ago. She has never used smokeless tobacco. She reports current alcohol use. She reports that she does not use drugs.

## 2019-06-02 NOTE — Patient Instructions (Signed)
Lab test today to check for inherited form of emphysema  Check with your primary care doctor about when you need a booster pneumonia shot  Call when you need refill on inhalers and let us know if you want to change to [facility] in place of Spiriva and Symbicort  Follow up in 1 year

## 2019-06-06 LAB — ALPHA-1 ANTITRYPSIN PHENOTYPE: A-1 Antitrypsin, Ser: 102 mg/dL (ref 101–187)

## 2019-06-09 ENCOUNTER — Ambulatory Visit: Payer: Medicare HMO | Attending: Internal Medicine

## 2019-06-09 DIAGNOSIS — Z23 Encounter for immunization: Secondary | ICD-10-CM

## 2019-06-09 NOTE — Progress Notes (Signed)
   Covid-19 Vaccination Clinic  Name:  Mary Blevins    MRN: [id number] DOB: 1945/10/17  06/09/2019  [name] was observed post Covid-19 immunization for 15 minutes without incident. She was provided with Vaccine Information Sheet and instruction to access the V-Safe system.   [name] was instructed to call 911 with any severe reactions post vaccine: Marland Kitchen Difficulty breathing  . Swelling of face and throat  . A fast heartbeat  . A bad rash all over body  . Dizziness and weakness   Immunizations Administered    Name Date Dose VIS Date Route   Pfizer COVID-19 Vaccine 06/09/2019 11:01 AM 0.3 mL 03/19/2018 Intramuscular   Manufacturer: [place]   Lot: [id number]   [place]: [id number]

## 2019-06-10 ENCOUNTER — Telehealth: Payer: Self-pay | Admitting: Pulmonary Disease

## 2019-06-10 NOTE — Telephone Encounter (Signed)
Called and spoke with pt letting her know the results of labwork and she verbalized understanding. Nothing further needed. 

## 2019-06-10 NOTE — Telephone Encounter (Signed)
A1AT 06/02/19 >> 102, MM   Please let her know that her lab test is normal. She does not have inherited form of emphysema.

## 2019-06-16 ENCOUNTER — Other Ambulatory Visit: Payer: Self-pay

## 2019-06-16 ENCOUNTER — Ambulatory Visit (INDEPENDENT_AMBULATORY_CARE_PROVIDER_SITE_OTHER): Payer: Medicare HMO | Admitting: Family Medicine

## 2019-06-16 VITALS — BP 120/70 | HR 76 | Temp 94.9°F | Wt 153.0 lb

## 2019-06-16 DIAGNOSIS — L299 Pruritus, unspecified: Secondary | ICD-10-CM | POA: Diagnosis not present

## 2019-06-16 MED ORDER — HYDROXYZINE HCL 25 MG PO TABS: 25.0000 mg | ORAL_TABLET | Freq: Three times a day (TID) | ORAL | 0 refills | Status: DC | PRN

## 2019-06-16 NOTE — Progress Notes (Signed)
Subjective:    Patient ID: Mary Blevins, female    DOB: 1945-03-29, 74 y.o.   MRN: [id number]  HPI  Patient is a very pleasant 74 year old African-American female who presents today complaining of itching all over her body.  Is primarily on her arms and legs.  There is no discernible rash.  She does have 1 small 2 to 3 mm bug bite on her left forearm however in the other areas where she is complaining about itching there is no discernible rash.  She denies any fevers or chills.  She denies any any recent viral illness.  She denies any sick contacts.  No one around her is been itching.  She denies any recent yard work or she may have gotten exposed to poison oak or poison ivy.  She denies any new medication. Past Medical History:  Diagnosis Date  . Arthritis   . COPD (chronic obstructive pulmonary disease) ([place])   . Elevated lipids   . Generalized headaches   . History of pneumonia   . Hyperlipidemia   . Hypothyroidism   . MVP (mitral valve prolapse)    palpitations  . Osteopenia   . Seasonal allergies   . Sinusitis   . Tubular adenoma of colon    Past Surgical History:  Procedure Laterality Date  . COLONOSCOPY WITH PROPOFOL N/A 01/06/2016   Procedure: COLONOSCOPY WITH PROPOFOL;  Surgeon: Doran Stabler, MD;  Location: [facility];  Service: Gastroenterology;  Laterality: N/A;  . DILATION AND CURETTAGE OF UTERUS    . THYROIDECTOMY    . TONSILLECTOMY     Current Outpatient Medications on File Prior to Visit  Medication Sig Dispense Refill  . ALPRAZolam (XANAX) 0.5 MG tablet Take 1 tablet (0.5 mg total) by mouth 3 (three) times daily as needed. 30 tablet 0  . budesonide-formoterol (SYMBICORT) 160-4.5 MCG/ACT inhaler Inhale 2 puffs into the lungs 2 (two) times daily. 1 Inhaler 3  . diclofenac (VOLTAREN) 75 MG EC tablet TAKE 1 TABLET (75 MG TOTAL) BY MOUTH 2 (TWO) TIMES DAILY. AS NEEDED FOR PAIN 30 tablet 3  . fluticasone (FLONASE) 50 MCG/ACT nasal spray SPRAY 2 SPRAYS  INTO EACH NOSTRIL EVERY DAY 16 g 2  . HYDROcodone-acetaminophen (NORCO) 5-325 MG tablet Take 1 tablet by mouth every 6 (six) hours as needed for moderate pain. 30 tablet 0  . levocetirizine (XYZAL) 5 MG tablet TAKE 1 TABLET BY MOUTH EVERY DAY IN THE EVENING 90 tablet 3  . levothyroxine (SYNTHROID) 50 MCG tablet TAKE 1 TABLET BY MOUTH DAILY BEFORE BREAKFAST 90 tablet 3  . meclizine (ANTIVERT) 25 MG tablet Take 1 tablet (25 mg total) by mouth 3 (three) times daily as needed for dizziness. 30 tablet 0  . Propylene Glycol-Glycerin (SOOTHE) 0.6-0.6 % SOLN Apply to eye.    . rosuvastatin (CRESTOR) 5 MG tablet TAKE 1 TABLET BY MOUTH EVERY DAY 90 tablet 3  . Tiotropium Bromide Monohydrate (SPIRIVA RESPIMAT) 2.5 MCG/ACT AERS Inhale 1 Inhaler into the lungs daily. 1 g 3  . tiZANidine (ZANAFLEX) 4 MG tablet TAKE 1 TABLET (4 MG TOTAL) BY MOUTH EVERY 6 (SIX) HOURS AS NEEDED FOR MUSCLE SPASMS. 30 tablet 0  . verapamil (CALAN-SR) 120 MG CR tablet Take 1 tablet (120 mg total) by mouth daily. 90 tablet 3   No current facility-administered medications on file prior to visit.   Allergies  Allergen Reactions  . Meloxicam Other (See Comments)    BAD DREAMS, SAD THOUGHTS   Social History  Socioeconomic History  . Marital status: Widowed    Spouse name: Not on file  . Number of children: 0  . Years of education: Not on file  . Highest education level: Not on file  Occupational History    Employer: [employer]  Tobacco Use  . Smoking status: Former Smoker    Quit date: 07/24/2005    Years since quitting: 13.9  . Smokeless tobacco: Never Used  Substance and Sexual Activity  . Alcohol use: Yes    Comment: rare  . Drug use: No  . Sexual activity: Not on file  Other Topics Concern  . Not on file  Social History Narrative  . Not on file   Social Determinants of Health   Financial Resource Strain:   . Difficulty of Paying Living Expenses:   Food Insecurity:   . Worried About Paediatric nurse in the Last Year:   . Arboriculturist in the Last Year:   Transportation Needs:   . Film/video editor (Medical):   Marland Kitchen Lack of Transportation (Non-Medical):   Physical Activity:   . Days of Exercise per Week:   . Minutes of Exercise per Session:   Stress:   . Feeling of Stress :   Social Connections:   . Frequency of Communication with Friends and Family:   . Frequency of Social Gatherings with Friends and Family:   . Attends Religious Services:   . Active Member of Clubs or Organizations:   . Attends Archivist Meetings:   Marland Kitchen Marital Status:   Intimate Partner Violence:   . Fear of Current or Ex-Partner:   . Emotionally Abused:   Marland Kitchen Physically Abused:   . Sexually Abused:      Review of Systems  All other systems reviewed and are negative.      Objective:   Physical Exam Vitals reviewed.  Constitutional:      Appearance: She is normal weight.  Cardiovascular:     Rate and Rhythm: Normal rate and regular rhythm.     Heart sounds: Normal heart sounds.  Pulmonary:     Effort: Pulmonary effort is normal.     Breath sounds: Normal breath sounds.  Skin:    Coloration: Skin is not jaundiced or pale.     Findings: No bruising, erythema, lesion or rash.  Neurological:     Mental Status: She is alert.           Assessment & Plan:  Itching  I believe the patient's itching could either be due to insect bites versus anxiety and I am leaning towards anxiety.  I would recommend trying hydroxyzine 25 mg every 8 hours as needed and like to see how the patient responds to this.

## 2019-06-20 DIAGNOSIS — J449 Chronic obstructive pulmonary disease, unspecified: Secondary | ICD-10-CM | POA: Diagnosis not present

## 2019-06-30 ENCOUNTER — Ambulatory Visit: Payer: Medicare HMO | Attending: Internal Medicine

## 2019-06-30 DIAGNOSIS — Z23 Encounter for immunization: Secondary | ICD-10-CM

## 2019-06-30 NOTE — Progress Notes (Signed)
° °  Covid-19 Vaccination Clinic  Name:  Mary Blevins    MRN: [id number] DOB: 1945-12-11  06/30/2019  Mary Blevins was observed post Covid-19 immunization for 15 minutes without incident. She was provided with Vaccine Information Sheet and instruction to access the V-Safe system.   Mary Blevins was instructed to call 911 with any severe reactions post vaccine:  Difficulty breathing   Swelling of face and throat   A fast heartbeat   A bad rash all over body   Dizziness and weakness   Immunizations Administered    Name Date Dose VIS Date Route   Pfizer COVID-19 Vaccine 06/30/2019 11:00 AM 0.3 mL 03/19/2018 Intramuscular   Manufacturer: [place]   Lot: [id number]   [place]: [id number]

## 2019-07-02 ENCOUNTER — Telehealth: Payer: Self-pay | Admitting: Family Medicine

## 2019-07-02 NOTE — Telephone Encounter (Signed)
Pt will take med 3 times a day

## 2019-07-02 NOTE — Telephone Encounter (Signed)
CB# [phone] Pt taking Hydroxyzine as needed has question how far apart should she take them

## 2019-07-03 ENCOUNTER — Telehealth: Payer: Self-pay | Admitting: Family Medicine

## 2019-07-03 ENCOUNTER — Telehealth: Payer: Self-pay | Admitting: Pulmonary Disease

## 2019-07-03 MED ORDER — BUDESONIDE-FORMOTEROL FUMARATE 160-4.5 MCG/ACT IN AERO: 2.0000 | INHALATION_SPRAY | Freq: Two times a day (BID) | RESPIRATORY_TRACT | 3 refills | Status: DC

## 2019-07-03 MED ORDER — SPIRIVA RESPIMAT 2.5 MCG/ACT IN AERS: 1.0000 | INHALATION_SPRAY | Freq: Every day | RESPIRATORY_TRACT | 3 refills | Status: DC

## 2019-07-03 MED ORDER — SPIRIVA RESPIMAT 2.5 MCG/ACT IN AERS: 2.0000 | INHALATION_SPRAY | Freq: Every day | RESPIRATORY_TRACT | 0 refills | Status: DC

## 2019-07-03 NOTE — Telephone Encounter (Signed)
Spoke with the pt  No samples of symbicort  I gave sample of spiriva  Nothing further needed

## 2019-07-03 NOTE — Telephone Encounter (Signed)
Left message for patient to call back. We do not have samples of Symbicort.

## 2019-07-03 NOTE — Telephone Encounter (Signed)
Error rx  sent over

## 2019-07-08 ENCOUNTER — Other Ambulatory Visit: Payer: Self-pay | Admitting: Family Medicine

## 2019-07-08 MED ORDER — BUDESONIDE-FORMOTEROL FUMARATE 160-4.5 MCG/ACT IN AERO: 2.0000 | INHALATION_SPRAY | Freq: Two times a day (BID) | RESPIRATORY_TRACT | 3 refills | Status: DC

## 2019-07-08 MED ORDER — SPIRIVA RESPIMAT 2.5 MCG/ACT IN AERS: 1.0000 | INHALATION_SPRAY | Freq: Every day | RESPIRATORY_TRACT | 3 refills | Status: DC

## 2019-07-09 ENCOUNTER — Other Ambulatory Visit: Payer: Self-pay

## 2019-07-09 MED ORDER — SPIRIVA RESPIMAT 2.5 MCG/ACT IN AERS: 2.0000 | INHALATION_SPRAY | Freq: Every day | RESPIRATORY_TRACT | 0 refills | Status: DC

## 2019-07-09 MED ORDER — SPIRIVA RESPIMAT 2.5 MCG/ACT IN AERS: 1.0000 | INHALATION_SPRAY | Freq: Every day | RESPIRATORY_TRACT | 3 refills | Status: DC

## 2019-07-10 ENCOUNTER — Other Ambulatory Visit: Payer: Self-pay | Admitting: Family Medicine

## 2019-07-10 MED ORDER — HYDROCODONE-ACETAMINOPHEN 5-325 MG PO TABS: 1.0000 | ORAL_TABLET | Freq: Four times a day (QID) | ORAL | 0 refills | Status: DC | PRN

## 2019-07-10 NOTE — Telephone Encounter (Signed)
Patient called in requesting a refill on her Hydrocodone.   Last refilled: 05/27/2019 Last office visit: 06/16/2019

## 2019-07-11 ENCOUNTER — Other Ambulatory Visit: Payer: Self-pay

## 2019-07-11 MED ORDER — SPIRIVA RESPIMAT 2.5 MCG/ACT IN AERS: 2.0000 | INHALATION_SPRAY | Freq: Every day | RESPIRATORY_TRACT | 5 refills | Status: DC

## 2019-07-14 ENCOUNTER — Telehealth: Payer: Self-pay | Admitting: Family Medicine

## 2019-07-14 NOTE — Progress Notes (Signed)
  Chronic Care Management   Outreach Note  07/14/2019 Name: Mary Blevins MRN: [id number] DOB: 05-Nov-1945  Referred by: [name], MD Reason for referral : No chief complaint on file.   An unsuccessful telephone outreach was attempted today. The patient was referred to the pharmacist for assistance with care management and care coordination.   Follow Up Plan:   [place]

## 2019-07-21 DIAGNOSIS — J449 Chronic obstructive pulmonary disease, unspecified: Secondary | ICD-10-CM | POA: Diagnosis not present

## 2019-07-26 ENCOUNTER — Other Ambulatory Visit: Payer: Self-pay | Admitting: Interventional Cardiology

## 2019-07-31 ENCOUNTER — Other Ambulatory Visit: Payer: Self-pay | Admitting: Family Medicine

## 2019-08-04 ENCOUNTER — Telehealth: Payer: Self-pay | Admitting: Family Medicine

## 2019-08-04 NOTE — Progress Notes (Signed)
  Chronic Care Management   Outreach Note  08/04/2019 Name: Mary Blevins MRN: [id number] DOB: 1945/05/17  Referred by: [name], MD Reason for referral : No chief complaint on file.   A second unsuccessful telephone outreach was attempted today. The patient was referred to pharmacist for assistance with care management and care coordination.  Follow Up Plan:   [place]

## 2019-08-07 ENCOUNTER — Telehealth: Payer: Self-pay

## 2019-08-07 ENCOUNTER — Other Ambulatory Visit: Payer: Self-pay

## 2019-08-07 DIAGNOSIS — Z86010 Personal history of colonic polyps: Secondary | ICD-10-CM

## 2019-08-07 NOTE — Telephone Encounter (Signed)
Left message to call, offered colonoscopy at [facility] hospital on 10-13-2019

## 2019-08-11 ENCOUNTER — Telehealth: Payer: Self-pay | Admitting: *Deleted

## 2019-08-11 NOTE — Telephone Encounter (Signed)
Received VM from patient.   Reports that she is currently scheduled for colonoscopy. Requested MD to review and advise is she is appropriate for colonoscopy as she is 74 years old.   Also reports that Atarax is not effective for her itching.   MD please advise.

## 2019-08-12 NOTE — Telephone Encounter (Signed)
Call placed to patient and patient made aware.  

## 2019-08-12 NOTE — Telephone Encounter (Signed)
I would recommend a colonoscopy at 80.  I do not have anything stronger for itching that hydroxyzine.

## 2019-08-13 ENCOUNTER — Other Ambulatory Visit: Payer: Self-pay | Admitting: *Deleted

## 2019-08-13 MED ORDER — SPIRIVA RESPIMAT 2.5 MCG/ACT IN AERS: 2.0000 | INHALATION_SPRAY | Freq: Every day | RESPIRATORY_TRACT | 3 refills | Status: DC

## 2019-08-20 DIAGNOSIS — J449 Chronic obstructive pulmonary disease, unspecified: Secondary | ICD-10-CM | POA: Diagnosis not present

## 2019-08-21 ENCOUNTER — Telehealth: Payer: Self-pay | Admitting: Family Medicine

## 2019-08-21 NOTE — Progress Notes (Signed)
  Chronic Care Management   Outreach Note  08/21/2019 Name: Mary Blevins MRN: [id number] DOB: 12/05/1945  Referred by: [name], MD Reason for referral : No chief complaint on file.   Third unsuccessful telephone outreach was attempted today. The patient was referred to the pharmacist for assistance with care management and care coordination.   Follow Up Plan:   [place] [employer] [facility]

## 2019-09-02 ENCOUNTER — Other Ambulatory Visit: Payer: Self-pay

## 2019-09-02 ENCOUNTER — Other Ambulatory Visit: Payer: Self-pay | Admitting: Family Medicine

## 2019-09-02 MED ORDER — HYDROCODONE-ACETAMINOPHEN 5-325 MG PO TABS: 1.0000 | ORAL_TABLET | Freq: Four times a day (QID) | ORAL | 0 refills | Status: DC | PRN

## 2019-09-02 NOTE — Telephone Encounter (Signed)
Ok to refill??  Last office visit 06/16/2019.  Last refill 07/10/2019.

## 2019-09-02 NOTE — Telephone Encounter (Signed)
Patient left vm requesting her hydrocodone.  CB# [phone]

## 2019-09-20 DIAGNOSIS — J449 Chronic obstructive pulmonary disease, unspecified: Secondary | ICD-10-CM | POA: Diagnosis not present

## 2019-09-30 NOTE — Progress Notes (Signed)
Attempted to obtain medical history via telephone, unable to reach at this time.  

## 2019-10-06 ENCOUNTER — Telehealth: Payer: Self-pay | Admitting: Gastroenterology

## 2019-10-06 NOTE — Telephone Encounter (Signed)
Understood - it is routine for history of colon polyps.   I would like her to call us when she feels ready to reschedule.  Please put on recall for January 2022 so we do not lose track of her.  - [place]

## 2019-10-06 NOTE — Telephone Encounter (Signed)
patient has cx her procedure at the hospital due to increase of COVID she is fearful of being in the hospital setting.

## 2019-10-07 NOTE — Telephone Encounter (Signed)
Recall has been placed.

## 2019-10-09 ENCOUNTER — Other Ambulatory Visit (HOSPITAL_COMMUNITY): Payer: Medicare HMO

## 2019-10-13 ENCOUNTER — Encounter (HOSPITAL_COMMUNITY): Payer: Self-pay

## 2019-10-13 ENCOUNTER — Ambulatory Visit (HOSPITAL_COMMUNITY): Admit: 2019-10-13 | Payer: Medicare HMO | Admitting: Gastroenterology

## 2019-10-13 SURGERY — COLONOSCOPY WITH PROPOFOL
Anesthesia: Monitor Anesthesia Care

## 2019-10-16 ENCOUNTER — Ambulatory Visit (INDEPENDENT_AMBULATORY_CARE_PROVIDER_SITE_OTHER): Payer: Medicare HMO

## 2019-10-16 ENCOUNTER — Other Ambulatory Visit: Payer: Self-pay

## 2019-10-16 DIAGNOSIS — Z23 Encounter for immunization: Secondary | ICD-10-CM

## 2019-10-21 DIAGNOSIS — J449 Chronic obstructive pulmonary disease, unspecified: Secondary | ICD-10-CM | POA: Diagnosis not present

## 2019-10-28 ENCOUNTER — Other Ambulatory Visit: Payer: Self-pay | Admitting: Family Medicine

## 2019-10-28 MED ORDER — LEVOTHYROXINE SODIUM 50 MCG PO TABS: ORAL_TABLET | ORAL | 3 refills | Status: DC

## 2019-10-28 NOTE — Telephone Encounter (Signed)
Prescription sent to pharmacy.

## 2019-10-28 NOTE — Telephone Encounter (Signed)
Refill Levothyroxine-   [id number] - [place] - [place]

## 2019-11-07 ENCOUNTER — Telehealth: Payer: Self-pay | Admitting: Family Medicine

## 2019-11-07 NOTE — Telephone Encounter (Signed)
Patient called and wanted to know if we had samples of spiriva 2.5 mg

## 2019-11-10 ENCOUNTER — Telehealth: Payer: Self-pay

## 2019-11-10 NOTE — Telephone Encounter (Signed)
Duplicate

## 2019-11-10 NOTE — Telephone Encounter (Signed)
Unfortunately, we do not have samples at this time.   Call placed to patient. No answer. No VM.

## 2019-11-10 NOTE — Telephone Encounter (Signed)
Pt is asking if we have samples of Spiriva 2.5 inhaler normally she picks up samples at the office   Pt [phone]

## 2019-11-11 ENCOUNTER — Other Ambulatory Visit: Payer: Self-pay | Admitting: Family Medicine

## 2019-11-11 MED ORDER — HYDROCODONE-ACETAMINOPHEN 5-325 MG PO TABS: 1.0000 | ORAL_TABLET | Freq: Four times a day (QID) | ORAL | 0 refills | Status: DC | PRN

## 2019-11-11 NOTE — Telephone Encounter (Signed)
Pt is requesting Hydrocodone. Also, pt is having some sinus drainage but no samples of Spiriva to pick up.  Please Advise

## 2019-11-13 ENCOUNTER — Other Ambulatory Visit: Payer: Self-pay

## 2019-11-13 ENCOUNTER — Other Ambulatory Visit: Payer: Self-pay | Admitting: Family Medicine

## 2019-11-13 DIAGNOSIS — R0602 Shortness of breath: Secondary | ICD-10-CM

## 2019-11-13 DIAGNOSIS — J441 Chronic obstructive pulmonary disease with (acute) exacerbation: Secondary | ICD-10-CM

## 2019-11-13 MED ORDER — ALBUTEROL SULFATE (2.5 MG/3ML) 0.083% IN NEBU: 2.5000 mg | INHALATION_SOLUTION | Freq: Four times a day (QID) | RESPIRATORY_TRACT | 3 refills | Status: DC | PRN

## 2019-11-13 NOTE — Telephone Encounter (Signed)
[phone] Pt left voice message for a refill on her Albuterol 2.5mg  pt didn't leave what pharmacy call pt back to see what pharmacy no answer

## 2019-11-14 MED ORDER — ALBUTEROL SULFATE (2.5 MG/3ML) 0.083% IN NEBU: 2.5000 mg | INHALATION_SOLUTION | Freq: Four times a day (QID) | RESPIRATORY_TRACT | 3 refills | Status: DC | PRN

## 2019-11-14 NOTE — Telephone Encounter (Signed)
Medication refilled

## 2019-11-15 ENCOUNTER — Emergency Department (HOSPITAL_COMMUNITY)
Admission: EM | Admit: 2019-11-15 | Discharge: 2019-11-15 | Disposition: A | Discharge status: Home / Self Care | Payer: Medicare HMO | Attending: Emergency Medicine | Admitting: Emergency Medicine

## 2019-11-15 ENCOUNTER — Encounter (HOSPITAL_COMMUNITY): Payer: Self-pay

## 2019-11-15 DIAGNOSIS — E039 Hypothyroidism, unspecified: Secondary | ICD-10-CM | POA: Insufficient documentation

## 2019-11-15 DIAGNOSIS — J029 Acute pharyngitis, unspecified: Secondary | ICD-10-CM | POA: Diagnosis not present

## 2019-11-15 DIAGNOSIS — Z7951 Long term (current) use of inhaled steroids: Secondary | ICD-10-CM | POA: Insufficient documentation

## 2019-11-15 DIAGNOSIS — Z86010 Personal history of colonic polyps: Secondary | ICD-10-CM | POA: Insufficient documentation

## 2019-11-15 DIAGNOSIS — J449 Chronic obstructive pulmonary disease, unspecified: Secondary | ICD-10-CM | POA: Insufficient documentation

## 2019-11-15 DIAGNOSIS — Z79899 Other long term (current) drug therapy: Secondary | ICD-10-CM | POA: Insufficient documentation

## 2019-11-15 DIAGNOSIS — J019 Acute sinusitis, unspecified: Secondary | ICD-10-CM | POA: Diagnosis not present

## 2019-11-15 DIAGNOSIS — Z87891 Personal history of nicotine dependence: Secondary | ICD-10-CM | POA: Insufficient documentation

## 2019-11-15 MED ORDER — AMOXICILLIN 500 MG PO CAPS: 500.0000 mg | ORAL_CAPSULE | Freq: Three times a day (TID) | ORAL | 0 refills | Status: AC

## 2019-11-15 NOTE — Discharge Instructions (Addendum)
Take the entire course of the antibiotic prescribed.  I recommend using your Mucinex daily to help mobilize your nasal and postnasal drip secretions, make sure you are drinking plenty of fluids.  You may also benefit from ibuprofen or Tylenol which can also help relieve sore throat pain.  Warm salt gargles, warm tea with a teaspoon of honey can also be soothing for the throat.

## 2019-11-15 NOTE — ED Provider Notes (Signed)
[facility] EMERGENCY DEPARTMENT Provider Note   CSN: [id number] Arrival date & time: 11/15/19  0900     History Chief Complaint  Patient presents with  . Sore Throat    Mary Blevins is a 74 y.o. female with a history of COPD, hypothyroidism, MVP, history of sinusitis in association with seasonal allergies presenting with sore throat which started yesterday.  She describes having a 1 week history of increased sinus congestion with copious postnasal drip which she suspects is the source of her sore throat pain.  The drainage has been thick, white to yellow in coloration.  Is a congestion but does not as pressure.  No documented fevers.  She also denies chest pain or shortness of breath but has had increased cough which is also productive.  She took a dose of hydrocodone cough syrup yesterday evening which was helpful for her sore throat pain.  She is also been using her albuterol MDI for her chronic COPD, has not needed any increased dosing of this.  She has had no exposures to any one else with similar symptoms.  She is Covid vaccinated.   HPI     Past Medical History:  Diagnosis Date  . Arthritis   . COPD (chronic obstructive pulmonary disease) ([place])   . Elevated lipids   . Generalized headaches   . History of pneumonia   . Hyperlipidemia   . Hypothyroidism   . MVP (mitral valve prolapse)    palpitations  . Osteopenia   . Seasonal allergies   . Sinusitis   . Tubular adenoma of colon     Patient Active Problem List   Diagnosis Date Noted  . GAD (generalized anxiety disorder) 12/20/2016  . Chronic insomnia 12/20/2016  . Aortic atherosclerosis ([place]) 06/09/2016  . Benign neoplasm of ascending colon   . Benign neoplasm of transverse colon   . Benign neoplasm of sigmoid colon   . Diverticulosis of sigmoid colon   . Grade I internal hemorrhoids   . PSVT (paroxysmal supraventricular tachycardia) ([place]) 02/17/2015  . Back pain 07/22/2012  . Acute pain of left hip  07/22/2012  . Stricture and stenosis of esophagus 09/18/2011  . Hx of colonic polyps 09/18/2011  . COPD (chronic obstructive pulmonary disease) ([place]) 07/14/2010  . Generalized headaches 07/14/2010  . Osteopenia 07/14/2010  . Hypothyroid 07/14/2010  . Elevated lipids 07/14/2010    Past Surgical History:  Procedure Laterality Date  . COLONOSCOPY WITH PROPOFOL N/A 01/06/2016   Procedure: COLONOSCOPY WITH PROPOFOL;  Surgeon: Doran Stabler, MD;  Location: [facility];  Service: Gastroenterology;  Laterality: N/A;  . DILATION AND CURETTAGE OF UTERUS    . THYROIDECTOMY    . TONSILLECTOMY       OB History   No obstetric history on file.     Family History  Problem Relation Age of Onset  . Breast cancer Maternal Aunt   . Heart attack Maternal Grandmother   . Heart attack Mother   . Heart disease Mother   . Dementia Mother   . Diabetes Brother   . Prostate cancer Maternal Grandfather   . Healthy Father   . Healthy Sister   . Heart attack Other        NIECE  . Colon cancer Neg Hx   . Esophageal cancer Neg Hx   . Rectal cancer Neg Hx   . Stomach cancer Neg Hx     Social History   Tobacco Use  . Smoking status: Former Smoker  Quit date: 07/24/2005    Years since quitting: 14.3  . Smokeless tobacco: Never Used  Vaping Use  . Vaping Use: Never used  Substance Use Topics  . Alcohol use: Yes    Comment: rare  . Drug use: No    Home Medications Prior to Admission medications   Medication Sig Start Date End Date Taking? Authorizing Provider  albuterol (PROVENTIL) (2.5 MG/3ML) 0.083% nebulizer solution Take 3 mLs (2.5 mg total) by nebulization every 6 (six) hours as needed for wheezing or shortness of breath. 11/14/19   Walden, Modena Nunnery, MD  ALPRAZolam Duanne Moron) 0.5 MG tablet Take 1 tablet (0.5 mg total) by mouth 3 (three) times daily as needed. 01/28/19   [name], MD  amoxicillin (AMOXIL) 500 MG capsule Take 1 capsule (500 mg total) by mouth 3 (three) times  daily for 7 days. 11/15/19 11/22/19  [name], PA-C  budesonide-formoterol (SYMBICORT) 160-4.5 MCG/ACT inhaler Inhale 2 puffs into the lungs 2 (two) times daily. 07/08/19   [name], MD  diclofenac (VOLTAREN) 75 MG EC tablet TAKE 1 TABLET (75 MG TOTAL) BY MOUTH 2 (TWO) TIMES DAILY. AS NEEDED FOR PAIN 05/05/19   [name], MD  fluticasone [facility]) 50 MCG/ACT nasal spray SPRAY 2 SPRAYS INTO EACH NOSTRIL EVERY DAY 07/11/18   [name], MD  HYDROcodone-acetaminophen (NORCO) 5-325 MG tablet Take 1 tablet by mouth every 6 (six) hours as needed for moderate pain. 11/11/19   [name], MD  hydrOXYzine (ATARAX/VISTARIL) 25 MG tablet TAKE 1 TABLET (25 MG TOTAL) BY MOUTH 3 (THREE) TIMES DAILY AS NEEDED FOR ITCHING. 07/31/19   [name], MD  levocetirizine (XYZAL) 5 MG tablet TAKE 1 TABLET BY MOUTH EVERY DAY IN THE EVENING 04/19/18   [name], MD  levothyroxine (SYNTHROID) 50 MCG tablet TAKE 1 TABLET BY MOUTH DAILY BEFORE BREAKFAST 10/28/19   [name], MD  meclizine (ANTIVERT) 25 MG tablet Take 1 tablet (25 mg total) by mouth 3 (three) times daily as needed for dizziness. 02/10/19   [name], MD  Propylene Glycol-Glycerin (SOOTHE) 0.6-0.6 % SOLN Apply to eye.    [provider]  rosuvastatin (CRESTOR) 5 MG tablet TAKE 1 TABLET BY MOUTH EVERY DAY 07/29/19   Belva Crome, MD  Tiotropium Bromide Monohydrate (SPIRIVA RESPIMAT) 2.5 MCG/ACT AERS Inhale 2 puffs into the lungs daily. 08/13/19   [name], MD  tiZANidine (ZANAFLEX) 4 MG tablet TAKE 1 TABLET (4 MG TOTAL) BY MOUTH EVERY 6 (SIX) HOURS AS NEEDED FOR MUSCLE SPASMS. 08/16/17   [name], MD  verapamil (CALAN-SR) 120 MG CR tablet Take 1 tablet (120 mg total) by mouth daily. 05/06/19   Belva Crome, MD    Allergies    Meloxicam  Review of Systems   Review of Systems  Constitutional: Negative for chills and fever.  HENT: Positive for congestion, rhinorrhea, sinus  pressure and sore throat. Negative for ear pain, trouble swallowing and voice change.   Eyes: Negative for discharge.  Respiratory: Positive for cough. Negative for shortness of breath, wheezing and stridor.   Cardiovascular: Negative for chest pain and palpitations.  Gastrointestinal: Negative for abdominal pain, nausea and vomiting.  Genitourinary: Negative.   All other systems reviewed and are negative.   Physical Exam Updated Vital Signs BP 117/81 (BP Location: Right Arm)   Pulse (!) 59   Temp 98.9 F (37.2 C) (Oral)   Resp 15   SpO2 100%  Physical Exam Constitutional:      Appearance: She is well-developed.  HENT:     Head: Normocephalic and atraumatic.     Right Ear: Tympanic membrane and ear canal normal.     Left Ear: Tympanic membrane and ear canal normal.     Nose: Mucosal edema present. No congestion or rhinorrhea.     Right Sinus: No maxillary sinus tenderness or frontal sinus tenderness.     Left Sinus: No maxillary sinus tenderness or frontal sinus tenderness.     Comments: No pain, but constant pressure sinuses.    Mouth/Throat:     Pharynx: Uvula midline. Posterior oropharyngeal erythema present. No pharyngeal swelling, oropharyngeal exudate or uvula swelling.     Tonsils: No tonsillar abscesses.     Comments: Mild posterior pharyngeal erythema, no edema, no exudate.  Uvula is midline.  No tonsillar hypertrophy. Eyes:     Conjunctiva/sclera: Conjunctivae normal.  Cardiovascular:     Rate and Rhythm: Normal rate.     Heart sounds: Normal heart sounds.  Pulmonary:     Effort: Pulmonary effort is normal. No respiratory distress.     Breath sounds: No wheezing or rales.  Abdominal:     Palpations: Abdomen is soft.     Tenderness: There is no abdominal tenderness.  Musculoskeletal:        General: Normal range of motion.  Lymphadenopathy:     Comments: No head or neck adenopathy.  Skin:    General: Skin is warm and dry.     Findings: No rash.    Neurological:     Mental Status: She is alert and oriented to person, place, and time.     ED Results / Procedures / Treatments   Labs (all labs ordered are listed, but only abnormal results are displayed) Labs Reviewed - No data to display  EKG None  Radiology No results found.  Procedures Procedures (including critical care time)  Medications Ordered in ED Medications - No data to display  ED Course  I have reviewed the triage vital signs and the nursing notes.  Pertinent labs & imaging results that were available during my care of the patient were reviewed by me and considered in my medical decision making (see chart for details).    MDM Rules/Calculators/A&P                          Patient with sinusitis, now with sore throat felt secondary to postnasal drip.  She was placed on amoxicillin.  Encouraged to add her Mucinex which she does have at home.  Increase fluids.  Tylenol or Motrin for sore throat reduction, also discussed other home remedies for symptom relief..  Follow-up with her PCP if symptoms persist or worsen. Final Clinical Impression(s) / ED Diagnoses Final diagnoses:  Acute pharyngitis, unspecified etiology  Acute sinusitis, recurrence not specified, unspecified location    Rx / DC Orders ED Discharge Orders         Ordered    amoxicillin (AMOXIL) 500 MG capsule  3 times daily        11/15/19 1042           [name], Hershal Coria 11/15/19 1046    Noemi Chapel, MD 11/15/19 [phone]

## 2019-11-15 NOTE — ED Triage Notes (Signed)
Pt has had sinus drainage for the last week. Early last night, pt got a severe sore throat. Denies any fevers or other symptoms.

## 2019-11-17 ENCOUNTER — Telehealth: Payer: Self-pay | Admitting: Family Medicine

## 2019-11-17 ENCOUNTER — Other Ambulatory Visit: Payer: Self-pay | Admitting: Family Medicine

## 2019-11-17 MED ORDER — HYDROCODONE-HOMATROPINE 5-1.5 MG/5ML PO SYRP: 5.0000 mL | ORAL_SOLUTION | Freq: Three times a day (TID) | ORAL | 0 refills | Status: DC | PRN

## 2019-11-17 NOTE — Telephone Encounter (Signed)
Please Advise

## 2019-11-17 NOTE — Telephone Encounter (Signed)
Pt would like to get refill Hydrocodone cough Syrup [id number] - , [place] - [address] AT [facility]

## 2019-11-17 NOTE — Telephone Encounter (Signed)
I sent it in 

## 2019-11-20 DIAGNOSIS — J449 Chronic obstructive pulmonary disease, unspecified: Secondary | ICD-10-CM | POA: Diagnosis not present

## 2019-11-21 ENCOUNTER — Telehealth: Payer: Self-pay

## 2019-11-21 DIAGNOSIS — H2513 Age-related nuclear cataract, bilateral: Secondary | ICD-10-CM | POA: Diagnosis not present

## 2019-11-21 DIAGNOSIS — H524 Presbyopia: Secondary | ICD-10-CM | POA: Diagnosis not present

## 2019-11-21 NOTE — Telephone Encounter (Signed)
Yes get the booster shot.

## 2019-11-21 NOTE — Telephone Encounter (Signed)
Interested in Dispensing optician, wanted to know if you recommend her getting it? And is it necessary?

## 2019-11-24 NOTE — Telephone Encounter (Signed)
Spoke with Pt, and gave her Provider message on taking booster shot,

## 2019-11-27 ENCOUNTER — Telehealth: Payer: Self-pay | Admitting: Family Medicine

## 2019-11-27 NOTE — Telephone Encounter (Signed)
Pt calling to inform the office that [employer] is  going to fax a Rx form for Dr.Picakard to fill out so she can have her medication delivery to her home

## 2019-11-27 NOTE — Telephone Encounter (Signed)
noted 

## 2019-11-28 ENCOUNTER — Other Ambulatory Visit: Payer: Self-pay

## 2019-11-28 DIAGNOSIS — R0602 Shortness of breath: Secondary | ICD-10-CM

## 2019-11-28 DIAGNOSIS — J441 Chronic obstructive pulmonary disease with (acute) exacerbation: Secondary | ICD-10-CM

## 2019-11-28 MED ORDER — ROSUVASTATIN CALCIUM 5 MG PO TABS
5.0000 mg | ORAL_TABLET | Freq: Every day | ORAL | 2 refills | Status: DC
Start: 2019-11-28 — End: 2020-01-07

## 2019-11-28 MED ORDER — VERAPAMIL HCL ER 120 MG PO TBCR
120.0000 mg | EXTENDED_RELEASE_TABLET | Freq: Every day | ORAL | 3 refills | Status: DC
Start: 2019-11-28 — End: 2020-11-23

## 2019-11-28 MED ORDER — LEVOTHYROXINE SODIUM 50 MCG PO TABS: ORAL_TABLET | ORAL | 3 refills | Status: DC

## 2019-11-28 MED ORDER — ALBUTEROL SULFATE (2.5 MG/3ML) 0.083% IN NEBU: 2.5000 mg | INHALATION_SOLUTION | Freq: Four times a day (QID) | RESPIRATORY_TRACT | 3 refills | Status: DC | PRN

## 2019-12-02 ENCOUNTER — Other Ambulatory Visit: Payer: Self-pay

## 2019-12-08 ENCOUNTER — Other Ambulatory Visit: Payer: Self-pay

## 2019-12-08 ENCOUNTER — Ambulatory Visit (INDEPENDENT_AMBULATORY_CARE_PROVIDER_SITE_OTHER): Payer: Medicare HMO | Admitting: Family Medicine

## 2019-12-08 VITALS — BP 130/80 | HR 75 | Temp 97.5°F | Ht 65.0 in | Wt 160.0 lb

## 2019-12-08 DIAGNOSIS — M7551 Bursitis of right shoulder: Secondary | ICD-10-CM

## 2019-12-08 NOTE — Progress Notes (Signed)
Subjective:    Patient ID: Mary Blevins, female    DOB: Mar 19, 1945, 74 y.o.   MRN: [id number]  HPI  Patient complains of pain in her right shoulder. She describes it as an aching throbbing pain over the lateral right deltoid and the posterior aspect of the shoulder in the subacromial space. It hurts to raise her arm over 90 degrees. She has pain with range of motion though there is no palpable crepitus. She has some pain with empty can sign testing but there is no evidence of any muscle weakness. She denies any pain with palpation of the right biceps muscle. There is no pain with resisted elbow flexion. She has normal reflexes checked at the brachioradialis and the triceps and the biceps. She denies any radiating pain going down her right arm. Therefore I favor that the right shoulder pain is subacromial bursitis. Past Medical History:  Diagnosis Date   Arthritis    COPD (chronic obstructive pulmonary disease) ([facility])    Elevated lipids    Generalized headaches    History of pneumonia    Hyperlipidemia    Hypothyroidism    MVP (mitral valve prolapse)    palpitations   Osteopenia    Seasonal allergies    Sinusitis    Tubular adenoma of colon    Past Surgical History:  Procedure Laterality Date   COLONOSCOPY WITH PROPOFOL N/A 01/06/2016   Procedure: COLONOSCOPY WITH PROPOFOL;  Surgeon: Doran Stabler, MD;  Location: [facility];  Service: Gastroenterology;  Laterality: N/A;   DILATION AND CURETTAGE OF UTERUS     THYROIDECTOMY     TONSILLECTOMY     Current Outpatient Medications on File Prior to Visit  Medication Sig Dispense Refill   albuterol (PROVENTIL) (2.5 MG/3ML) 0.083% nebulizer solution Take 3 mLs (2.5 mg total) by nebulization every 6 (six) hours as needed for wheezing or shortness of breath. 150 mL 3   ALPRAZolam (XANAX) 0.5 MG tablet Take 1 tablet (0.5 mg total) by mouth 3 (three) times daily as needed. 30 tablet 0   budesonide-formoterol  (SYMBICORT) 160-4.5 MCG/ACT inhaler Inhale 2 puffs into the lungs 2 (two) times daily. 3 Inhaler 3   fluticasone (FLONASE) 50 MCG/ACT nasal spray SPRAY 2 SPRAYS INTO EACH NOSTRIL EVERY DAY 16 g 2   HYDROcodone-acetaminophen (NORCO) 5-325 MG tablet Take 1 tablet by mouth every 6 (six) hours as needed for moderate pain. 30 tablet 0   HYDROcodone-homatropine (HYCODAN) 5-1.5 MG/5ML syrup Take 5 mLs by mouth every 8 (eight) hours as needed for cough. 120 mL 0   hydrOXYzine (ATARAX/VISTARIL) 25 MG tablet TAKE 1 TABLET (25 MG TOTAL) BY MOUTH 3 (THREE) TIMES DAILY AS NEEDED FOR ITCHING. 30 tablet 6   levocetirizine (XYZAL) 5 MG tablet TAKE 1 TABLET BY MOUTH EVERY DAY IN THE EVENING 90 tablet 3   levothyroxine (SYNTHROID) 50 MCG tablet TAKE 1 TABLET BY MOUTH DAILY BEFORE BREAKFAST 90 tablet 3   meclizine (ANTIVERT) 25 MG tablet Take 1 tablet (25 mg total) by mouth 3 (three) times daily as needed for dizziness. 30 tablet 0   Propylene Glycol-Glycerin (SOOTHE) 0.6-0.6 % SOLN Apply to eye.     rosuvastatin (CRESTOR) 5 MG tablet Take 1 tablet (5 mg total) by mouth daily. 90 tablet 2   Tiotropium Bromide Monohydrate (SPIRIVA RESPIMAT) 2.5 MCG/ACT AERS Inhale 2 puffs into the lungs daily. 16 g 3   tiZANidine (ZANAFLEX) 4 MG tablet TAKE 1 TABLET (4 MG TOTAL) BY MOUTH EVERY 6 (SIX)  HOURS AS NEEDED FOR MUSCLE SPASMS. 30 tablet 0   verapamil (CALAN-SR) 120 MG CR tablet Take 1 tablet (120 mg total) by mouth daily. 90 tablet 3   No current facility-administered medications on file prior to visit.   Allergies  Allergen Reactions   Meloxicam Other (See Comments)    BAD DREAMS, SAD THOUGHTS   Social History   Socioeconomic History   Marital status: Widowed    Spouse name: Not on file   Number of children: 0   Years of education: Not on file   Highest education level: Not on file  Occupational History    Employer: [employer]  Tobacco Use   Smoking status: Former Smoker     Quit date: 07/24/2005    Years since quitting: 14.3   Smokeless tobacco: Never Used  Vaping Use   Vaping Use: Never used  Substance and Sexual Activity   Alcohol use: Yes    Comment: rare   Drug use: No   Sexual activity: Not on file  Other Topics Concern   Not on file  Social History Narrative   Not on file   Social Determinants of Health   Financial Resource Strain:    Difficulty of Paying Living Expenses: Not on file  Food Insecurity:    Worried About Charity fundraiser in the Last Year: Not on file   [employer] of Food in the Last Year: Not on file  Transportation Needs:    Lack of Transportation (Medical): Not on file   Lack of Transportation (Non-Medical): Not on file  Physical Activity:    Days of Exercise per Week: Not on file   Minutes of Exercise per Session: Not on file  Stress:    Feeling of Stress : Not on file  Social Connections:    Frequency of Communication with Friends and Family: Not on file   Frequency of Social Gatherings with Friends and Family: Not on file   Attends Religious Services: Not on file   Active Member of Clubs or Organizations: Not on file   Attends Archivist Meetings: Not on file   Marital Status: Not on file  Intimate Partner Violence:    Fear of Current or Ex-Partner: Not on file   Emotionally Abused: Not on file   Physically Abused: Not on file   Sexually Abused: Not on file    Past Medical History:  Diagnosis Date   Arthritis    COPD (chronic obstructive pulmonary disease) ([place])    Elevated lipids    Generalized headaches    History of pneumonia    Hyperlipidemia    Hypothyroidism    MVP (mitral valve prolapse)    palpitations   Osteopenia    Seasonal allergies    Sinusitis    Tubular adenoma of colon    Past Surgical History:  Procedure Laterality Date   COLONOSCOPY WITH PROPOFOL N/A 01/06/2016   Procedure: COLONOSCOPY WITH PROPOFOL;  Surgeon: Doran Stabler, MD;   Location: [facility];  Service: Gastroenterology;  Laterality: N/A;   DILATION AND CURETTAGE OF UTERUS     THYROIDECTOMY     TONSILLECTOMY     Current Outpatient Medications on File Prior to Visit  Medication Sig Dispense Refill   albuterol (PROVENTIL) (2.5 MG/3ML) 0.083% nebulizer solution Take 3 mLs (2.5 mg total) by nebulization every 6 (six) hours as needed for wheezing or shortness of breath. 150 mL 3   ALPRAZolam (XANAX) 0.5 MG tablet Take 1 tablet (  0.5 mg total) by mouth 3 (three) times daily as needed. 30 tablet 0   budesonide-formoterol (SYMBICORT) 160-4.5 MCG/ACT inhaler Inhale 2 puffs into the lungs 2 (two) times daily. 3 Inhaler 3   fluticasone (FLONASE) 50 MCG/ACT nasal spray SPRAY 2 SPRAYS INTO EACH NOSTRIL EVERY DAY 16 g 2   HYDROcodone-acetaminophen (NORCO) 5-325 MG tablet Take 1 tablet by mouth every 6 (six) hours as needed for moderate pain. 30 tablet 0   HYDROcodone-homatropine (HYCODAN) 5-1.5 MG/5ML syrup Take 5 mLs by mouth every 8 (eight) hours as needed for cough. 120 mL 0   hydrOXYzine (ATARAX/VISTARIL) 25 MG tablet TAKE 1 TABLET (25 MG TOTAL) BY MOUTH 3 (THREE) TIMES DAILY AS NEEDED FOR ITCHING. 30 tablet 6   levocetirizine (XYZAL) 5 MG tablet TAKE 1 TABLET BY MOUTH EVERY DAY IN THE EVENING 90 tablet 3   levothyroxine (SYNTHROID) 50 MCG tablet TAKE 1 TABLET BY MOUTH DAILY BEFORE BREAKFAST 90 tablet 3   meclizine (ANTIVERT) 25 MG tablet Take 1 tablet (25 mg total) by mouth 3 (three) times daily as needed for dizziness. 30 tablet 0   Propylene Glycol-Glycerin (SOOTHE) 0.6-0.6 % SOLN Apply to eye.     rosuvastatin (CRESTOR) 5 MG tablet Take 1 tablet (5 mg total) by mouth daily. 90 tablet 2   Tiotropium Bromide Monohydrate (SPIRIVA RESPIMAT) 2.5 MCG/ACT AERS Inhale 2 puffs into the lungs daily. 16 g 3   tiZANidine (ZANAFLEX) 4 MG tablet TAKE 1 TABLET (4 MG TOTAL) BY MOUTH EVERY 6 (SIX) HOURS AS NEEDED FOR MUSCLE SPASMS. 30 tablet 0   verapamil (CALAN-SR)  120 MG CR tablet Take 1 tablet (120 mg total) by mouth daily. 90 tablet 3   No current facility-administered medications on file prior to visit.   Allergies  Allergen Reactions   Meloxicam Other (See Comments)    BAD DREAMS, SAD THOUGHTS   Social History   Socioeconomic History   Marital status: Widowed    Spouse name: Not on file   Number of children: 0   Years of education: Not on file   Highest education level: Not on file  Occupational History    Employer: [employer]  Tobacco Use   Smoking status: Former Smoker    Quit date: 07/24/2005    Years since quitting: 14.3   Smokeless tobacco: Never Used  Vaping Use   Vaping Use: Never used  Substance and Sexual Activity   Alcohol use: Yes    Comment: rare   Drug use: No   Sexual activity: Not on file  Other Topics Concern   Not on file  Social History Narrative   Not on file   Social Determinants of Health   Financial Resource Strain:    Difficulty of Paying Living Expenses: Not on file  Food Insecurity:    Worried About Charity fundraiser in the Last Year: Not on file   [employer] of Food in the Last Year: Not on file  Transportation Needs:    Lack of Transportation (Medical): Not on file   Lack of Transportation (Non-Medical): Not on file  Physical Activity:    Days of Exercise per Week: Not on file   Minutes of Exercise per Session: Not on file  Stress:    Feeling of Stress : Not on file  Social Connections:    Frequency of Communication with Friends and Family: Not on file   Frequency of Social Gatherings with Friends and Family: Not on file   Attends Religious  Services: Not on file   Active Member of Clubs or Organizations: Not on file   Attends Archivist Meetings: Not on file   Marital Status: Not on file  Intimate Partner Violence:    Fear of Current or Ex-Partner: Not on file   Emotionally Abused: Not on file   Physically Abused: Not on file    Sexually Abused: Not on file      Review of Systems  All other systems reviewed and are negative.      Objective:   Physical Exam Vitals reviewed.  Constitutional:      General: She is not in acute distress.    Appearance: She is well-developed. She is not diaphoretic.  Neck:     Vascular: No JVD.  Cardiovascular:     Rate and Rhythm: Normal rate and regular rhythm.     Heart sounds: Normal heart sounds.  Pulmonary:     Effort: Pulmonary effort is normal.     Breath sounds: Normal breath sounds.  Musculoskeletal:     Right shoulder: Tenderness present. No swelling, effusion, bony tenderness or crepitus. Decreased range of motion. Normal strength.     Right upper arm: No swelling, edema, deformity or tenderness.     Right elbow: No swelling, deformity or effusion. Normal range of motion. No tenderness.     Cervical back: Neck supple.           Assessment & Plan:  Subacromial bursitis of right shoulder joint  I believe the patient may have subacromial bursitis in her right shoulder. Using sterile technique, we injected the right subacromial space with 2 cc lidocaine, 2 cc of Marcaine, and 2 cc of 40 mg/mL Kenalog. The patient tolerated the procedure well without complication.

## 2019-12-12 ENCOUNTER — Telehealth: Payer: Self-pay | Admitting: Family Medicine

## 2019-12-12 ENCOUNTER — Other Ambulatory Visit: Payer: Self-pay

## 2019-12-12 MED ORDER — HYDROXYZINE HCL 25 MG PO TABS: 25.0000 mg | ORAL_TABLET | Freq: Three times a day (TID) | ORAL | 6 refills | Status: DC | PRN

## 2019-12-12 NOTE — Telephone Encounter (Signed)
Need refill on her Hydroxzine itching medication

## 2019-12-21 DIAGNOSIS — J449 Chronic obstructive pulmonary disease, unspecified: Secondary | ICD-10-CM | POA: Diagnosis not present

## 2019-12-24 DIAGNOSIS — H25812 Combined forms of age-related cataract, left eye: Secondary | ICD-10-CM | POA: Diagnosis not present

## 2019-12-24 DIAGNOSIS — H2512 Age-related nuclear cataract, left eye: Secondary | ICD-10-CM | POA: Diagnosis not present

## 2020-01-07 ENCOUNTER — Other Ambulatory Visit: Payer: Self-pay

## 2020-01-07 ENCOUNTER — Encounter: Payer: Self-pay | Admitting: Nurse Practitioner

## 2020-01-07 ENCOUNTER — Ambulatory Visit (INDEPENDENT_AMBULATORY_CARE_PROVIDER_SITE_OTHER): Payer: Medicare HMO | Admitting: Nurse Practitioner

## 2020-01-07 VITALS — BP 124/76 | HR 76 | Temp 99.1°F | Ht 65.0 in | Wt 160.0 lb

## 2020-01-07 DIAGNOSIS — G8929 Other chronic pain: Secondary | ICD-10-CM

## 2020-01-07 DIAGNOSIS — R252 Cramp and spasm: Secondary | ICD-10-CM

## 2020-01-07 DIAGNOSIS — M545 Low back pain, unspecified: Secondary | ICD-10-CM | POA: Diagnosis not present

## 2020-01-07 MED ORDER — HYDROCODONE-ACETAMINOPHEN 5-325 MG PO TABS: 1.0000 | ORAL_TABLET | Freq: Four times a day (QID) | ORAL | 0 refills | Status: DC | PRN

## 2020-01-07 NOTE — Progress Notes (Signed)
Subjective:    Patient ID: Mary Blevins, female    DOB: 1945-08-23, 74 y.o.   MRN: [id number]  HPI: Mary Blevins is a 74 y.o. female presenting for cramping and requesting a refill of her pain medication.  Chief Complaint  Patient presents with  . NUMBESS    Right side numbness only leg and right hand. Told in the past due to poor circulation. Staring to have cramping in the leg and ankle as well  . Medication Refill    On pended medication chronic back pain   NUMBNESS/CRAMPING Reports she has chronic numbness/cold sensation of both lower extremities.  Most worried about the cramping and that recently started around her ankle. Duration: years for numbness, cramping in right leg only; started last week Onset: sudden Location: right lower extremity, around ankle Bilateral: no Symmetric: no Decreased sensation: no  Weakness: no Pain: yes Quality: Severity: severe  Frequency: random times throughout the day Trauma: no Recent illness: no Diabetes: no Thyroid disease: had thyroidectomy in [place]  HIV: no  Alcoholism: no  Spinal cord injury: no Alleviating factors: massage, horse gel, stop pain roll on Aggravating factors: nothing, random times Status: worse Treatments attempted: massage, horse gel, stop pain roll on  BACK PAIN Duration: chronic Mechanism of injury: arthritis per patient Location: midline Onset: gradual Severity: ranges mild to severe Quality: aching Frequency: intermittent Radiation: none Aggravating factors: overuse Alleviating factors: Norco Status: stable Treatments attempted:  Norco Relief with NSAIDs?: No NSAIDs Taken Nighttime pain:  no Paresthesias / decreased sensation:  no Bowel / bladder incontinence:  no Fevers:  no Dysuria / urinary frequency:  no   Allergies  Allergen Reactions  . Meloxicam Other (See Comments)    BAD DREAMS, SAD THOUGHTS    Outpatient Encounter Medications as of 01/07/2020  Medication Sig  Note  . albuterol (PROVENTIL) (2.5 MG/3ML) 0.083% nebulizer solution Take 3 mLs (2.5 mg total) by nebulization every 6 (six) hours as needed for wheezing or shortness of breath.   . ALPRAZolam (XANAX) 0.5 MG tablet Take 1 tablet (0.5 mg total) by mouth 3 (three) times daily as needed. 01/07/2020: ONLY AS NEEDED  . budesonide-formoterol (SYMBICORT) 160-4.5 MCG/ACT inhaler Inhale 2 puffs into the lungs 2 (two) times daily.   . fluticasone (FLONASE) 50 MCG/ACT nasal spray SPRAY 2 SPRAYS INTO EACH NOSTRIL EVERY DAY   . HYDROcodone-homatropine (HYCODAN) 5-1.5 MG/5ML syrup Take 5 mLs by mouth every 8 (eight) hours as needed for cough. 01/07/2020: AS NEEDED  . hydrOXYzine (ATARAX/VISTARIL) 25 MG tablet Take 1 tablet (25 mg total) by mouth 3 (three) times daily as needed for itching.   . levothyroxine (SYNTHROID) 50 MCG tablet TAKE 1 TABLET BY MOUTH DAILY BEFORE BREAKFAST   . Propylene Glycol-Glycerin 0.6-0.6 % SOLN Apply to eye.   . Tiotropium Bromide Monohydrate (SPIRIVA RESPIMAT) 2.5 MCG/ACT AERS Inhale 2 puffs into the lungs daily.   Marland Kitchen tiZANidine (ZANAFLEX) 4 MG tablet TAKE 1 TABLET (4 MG TOTAL) BY MOUTH EVERY 6 (SIX) HOURS AS NEEDED FOR MUSCLE SPASMS.   . verapamil (CALAN-SR) 120 MG CR tablet Take 1 tablet (120 mg total) by mouth daily.   . [DISCONTINUED] HYDROcodone-acetaminophen (NORCO) 5-325 MG tablet Take 1 tablet by mouth every 6 (six) hours as needed for moderate pain.   Marland Kitchen HYDROcodone-acetaminophen (NORCO) 5-325 MG tablet Take 1 tablet by mouth every 6 (six) hours as needed for moderate pain.   Marland Kitchen levocetirizine (XYZAL) 5 MG tablet TAKE 1 TABLET BY MOUTH EVERY DAY  IN THE EVENING (Patient not taking: No sig reported)   . meclizine (ANTIVERT) 25 MG tablet Take 1 tablet (25 mg total) by mouth 3 (three) times daily as needed for dizziness. (Patient not taking: Reported on 01/07/2020)   . [DISCONTINUED] rosuvastatin (CRESTOR) 5 MG tablet Take 1 tablet (5 mg total) by mouth daily.    No  facility-administered encounter medications on file as of 01/07/2020.    Patient Active Problem List   Diagnosis Date Noted  . Leg cramping 01/07/2020  . GAD (generalized anxiety disorder) 12/20/2016  . Chronic insomnia 12/20/2016  . Aortic atherosclerosis ([place]) 06/09/2016  . Benign neoplasm of ascending colon   . Benign neoplasm of transverse colon   . Benign neoplasm of sigmoid colon   . Diverticulosis of sigmoid colon   . Grade I internal hemorrhoids   . PSVT (paroxysmal supraventricular tachycardia) ([place]) 02/17/2015  . Back pain 07/22/2012  . Acute pain of left hip 07/22/2012  . Stricture and stenosis of esophagus 09/18/2011  . Hx of colonic polyps 09/18/2011  . COPD (chronic obstructive pulmonary disease) ([place]) 07/14/2010  . Generalized headaches 07/14/2010  . Osteopenia 07/14/2010  . Hypothyroid 07/14/2010  . Elevated lipids 07/14/2010    Past Medical History:  Diagnosis Date  . Arthritis   . COPD (chronic obstructive pulmonary disease) ([place])   . Elevated lipids   . Generalized headaches   . History of pneumonia   . Hyperlipidemia   . Hypothyroidism   . MVP (mitral valve prolapse)    palpitations  . Osteopenia   . Seasonal allergies   . Sinusitis   . Tubular adenoma of colon     Relevant past medical, surgical, family and social history reviewed and updated as indicated. Interim medical history since our last visit reviewed.  Review of Systems  Constitutional: Negative.  Negative for appetite change, fatigue and fever.  Musculoskeletal: Positive for back pain. Negative for arthralgias, gait problem, joint swelling and myalgias.       + ankle cramping  Skin: Negative.  Negative for rash.  Neurological: Negative.  Negative for dizziness, weakness, light-headedness, numbness and headaches.  Psychiatric/Behavioral: Negative.  Negative for confusion.    Per HPI unless specifically indicated above     Objective:    BP 124/76   Pulse 76   Temp 99.1 F (37.3  C)   Ht 5\' 5"  (1.651 m)   Wt 160 lb (72.6 kg)   SpO2 100%   BMI 26.63 kg/m   Wt Readings from Last 3 Encounters:  01/07/20 160 lb (72.6 kg)  12/08/19 160 lb (72.6 kg)  06/16/19 153 lb (69.4 kg)    Physical Exam Vitals and nursing note reviewed.  Constitutional:      General: She is not in acute distress.    Appearance: Normal appearance. She is not toxic-appearing.  Musculoskeletal:        General: No tenderness. Normal range of motion.     Right lower leg: Normal. No swelling or tenderness. No edema.     Left lower leg: Normal. No swelling or tenderness. No edema.     Right ankle: Normal. No swelling or deformity. No tenderness. Normal range of motion.     Left ankle: Normal. No swelling or deformity. No tenderness. Normal range of motion.     Right foot: Normal. Normal range of motion and normal capillary refill. No swelling, laceration or tenderness. Normal pulse.     Left foot: Normal. Normal range of motion and normal capillary refill.  No swelling, laceration or tenderness. Normal pulse.  Skin:    General: Skin is warm and dry.     Coloration: Skin is not jaundiced or pale.     Findings: No erythema.  Neurological:     General: No focal deficit present.     Mental Status: She is alert and oriented to person, place, and time.     Motor: No weakness.     Gait: Gait normal.  Psychiatric:        Mood and Affect: Mood normal.        Behavior: Behavior normal.        Thought Content: Thought content normal.        Judgment: Judgment normal.        Assessment & Plan:   Problem List Items Addressed This Visit      Other   Back pain    Chronic, ongoing.  Appears to have been increasing usage of Norco recently and is requesting refill.  Sometimes takes up to 2 per day to help with back pain.  Has tried PT in the past and does not wish to return at this time.  Discussed at length risk vs. Benefit of continuing Norco.  Encouraged patient to limit use of Norco as this  medication can be habit forming and instead, try back exercises and stretching.  If back pain remains uncontrolled, may consider re-imaging as well as continued close follow up with Ortho.      Relevant Medications   HYDROcodone-acetaminophen (NORCO) 5-325 MG tablet   Leg cramping - Primary    Acute, ongoing around right ankle.  Sensation intact today and no neuropathy on examination.  Examination unremarkable.  Unclear etiology, will check CMP today to rule out electrolyte abnormalities.  Encouraged to decrease tea consumption and increase water intake.  Okay to continue to use roll-on and horse gel to help with the cramping.  If symptoms persist, return to clinic.      Relevant Orders   Comprehensive metabolic panel       Follow up plan: Return if symptoms worsen or fail to improve.

## 2020-01-07 NOTE — Assessment & Plan Note (Signed)
Acute, ongoing around right ankle.  Sensation intact today and no neuropathy on examination.  Examination unremarkable.  Unclear etiology, will check CMP today to rule out electrolyte abnormalities.  Encouraged to decrease tea consumption and increase water intake.  Okay to continue to use roll-on and horse gel to help with the cramping.  If symptoms persist, return to clinic.

## 2020-01-07 NOTE — Assessment & Plan Note (Signed)
Chronic, ongoing.  Appears to have been increasing usage of Norco recently and is requesting refill.  Sometimes takes up to 2 per day to help with back pain.  Has tried PT in the past and does not wish to return at this time.  Discussed at length risk vs. Benefit of continuing Norco.  Encouraged patient to limit use of Norco as this medication can be habit forming and instead, try back exercises and stretching.  If back pain remains uncontrolled, may consider re-imaging as well as continued close follow up with Ortho.

## 2020-01-08 LAB — COMPREHENSIVE METABOLIC PANEL
AG Ratio: 1.5 (calc) (ref 1.0–2.5)
ALT: 25 U/L (ref 6–29)
AST: 22 U/L (ref 10–35)
Albumin: 3.8 g/dL (ref 3.6–5.1)
Alkaline phosphatase (APISO): 52 U/L (ref 37–153)
BUN: 14 mg/dL (ref 7–25)
CO2: 31 mmol/L (ref 20–32)
Calcium: 9.2 mg/dL (ref 8.6–10.4)
Chloride: 103 mmol/L (ref 98–110)
Creat: 0.79 mg/dL (ref 0.60–0.93)
Globulin: 2.5 g/dL (calc) (ref 1.9–3.7)
Glucose, Bld: 78 mg/dL (ref 65–99)
Potassium: 4.5 mmol/L (ref 3.5–5.3)
Sodium: 139 mmol/L (ref 135–146)
Total Bilirubin: 0.5 mg/dL (ref 0.2–1.2)
Total Protein: 6.3 g/dL (ref 6.1–8.1)

## 2020-01-09 DIAGNOSIS — Z1231 Encounter for screening mammogram for malignant neoplasm of breast: Secondary | ICD-10-CM | POA: Diagnosis not present

## 2020-01-15 ENCOUNTER — Telehealth: Payer: Self-pay | Admitting: Family Medicine

## 2020-01-15 NOTE — Telephone Encounter (Signed)
Best thing for cough is mucinex or delsym

## 2020-01-15 NOTE — Telephone Encounter (Signed)
See below

## 2020-01-15 NOTE — Telephone Encounter (Signed)
Cough,wheezing is any medication over the counter can be taken pleas advise pt

## 2020-01-19 NOTE — Telephone Encounter (Signed)
Will try to keep contacting patient, have not been able to reach.

## 2020-01-20 ENCOUNTER — Telehealth: Payer: Self-pay

## 2020-01-20 DIAGNOSIS — J449 Chronic obstructive pulmonary disease, unspecified: Secondary | ICD-10-CM | POA: Diagnosis not present

## 2020-01-20 NOTE — Telephone Encounter (Signed)
Spokw with patient about what Provider said was best for mucus and coughing.

## 2020-02-03 NOTE — Telephone Encounter (Signed)
Patient is scheduled for Booster shot on 02/06/20 she is currently still having sinus issues. Coughing, and mucus, her concern is should she still get booster shot as scheduled?

## 2020-02-03 NOTE — Telephone Encounter (Signed)
Yes I would

## 2020-02-03 NOTE — Telephone Encounter (Signed)
Patient made aware of Providers instructions to go ahead and get booster shot

## 2020-02-06 DIAGNOSIS — Z23 Encounter for immunization: Secondary | ICD-10-CM | POA: Diagnosis not present

## 2020-02-11 ENCOUNTER — Other Ambulatory Visit: Payer: Self-pay

## 2020-02-12 MED ORDER — HYDROCODONE-HOMATROPINE 5-1.5 MG/5ML PO SYRP: 5.0000 mL | ORAL_SOLUTION | Freq: Three times a day (TID) | ORAL | 0 refills | Status: DC | PRN

## 2020-02-20 DIAGNOSIS — J449 Chronic obstructive pulmonary disease, unspecified: Secondary | ICD-10-CM | POA: Diagnosis not present

## 2020-02-25 ENCOUNTER — Other Ambulatory Visit: Payer: Self-pay

## 2020-02-25 NOTE — Telephone Encounter (Signed)
Patient is requesting a refill of the following medications: Requested Prescriptions   Pending Prescriptions Disp Refills  . HYDROcodone-acetaminophen (NORCO) 5-325 MG tablet 30 tablet 0    Sig: Take 1 tablet by mouth every 6 (six) hours as needed for moderate pain.    Date of patient request: 02/25/2020 Last office visit: 01/07/2020 Date of last refill: 01/07/2020 Last refill amount: 30 tablets

## 2020-02-26 MED ORDER — HYDROCODONE-ACETAMINOPHEN 5-325 MG PO TABS: 1.0000 | ORAL_TABLET | Freq: Four times a day (QID) | ORAL | 0 refills | Status: DC | PRN

## 2020-03-08 ENCOUNTER — Telehealth: Payer: Self-pay | Admitting: Pulmonary Disease

## 2020-03-08 NOTE — Telephone Encounter (Signed)
Called and spoke with pt asking if she usually goes to [facility] or [facility] and she states that she sees [facility] in [place].  Stated to pt that I would send message to staff working at [facility] office for them to check to see if there is any samples of Spiriva 2.5 and stated to her that they would call her back to let her know this info and she verbalized understanding.  Anderson Malta, please advise if there are any samples of Spiriva 2.5 at the [place] office. Please contact pt with this info as she would like to pick samples up from there.

## 2020-03-09 NOTE — Telephone Encounter (Signed)
Called patient and patient stated she would pick up Spiriva 2.5 samples tomorrow (Wednesday) morning here at [facility] office.

## 2020-03-10 MED ORDER — SPIRIVA RESPIMAT 2.5 MCG/ACT IN AERS: 2.0000 | INHALATION_SPRAY | Freq: Every day | RESPIRATORY_TRACT | 0 refills | Status: DC

## 2020-03-10 NOTE — Telephone Encounter (Signed)
Patient came to [facility] office and picked up two samples of Spiriva 2.5 mcg. Nothing further needed at this time.

## 2020-03-10 NOTE — Addendum Note (Signed)
Addended by: Merrilee Seashore on: 03/10/2020 10:13 AM   Modules accepted: Orders

## 2020-03-22 DIAGNOSIS — J449 Chronic obstructive pulmonary disease, unspecified: Secondary | ICD-10-CM | POA: Diagnosis not present

## 2020-04-02 ENCOUNTER — Encounter: Payer: Self-pay | Admitting: Family Medicine

## 2020-04-02 ENCOUNTER — Other Ambulatory Visit: Payer: Self-pay

## 2020-04-02 ENCOUNTER — Ambulatory Visit (INDEPENDENT_AMBULATORY_CARE_PROVIDER_SITE_OTHER): Payer: Medicare HMO | Admitting: Family Medicine

## 2020-04-02 VITALS — BP 140/70 | HR 79 | Temp 98.2°F | Resp 14 | Ht 65.0 in | Wt 168.0 lb

## 2020-04-02 DIAGNOSIS — M159 Polyosteoarthritis, unspecified: Secondary | ICD-10-CM

## 2020-04-02 DIAGNOSIS — M8949 Other hypertrophic osteoarthropathy, multiple sites: Secondary | ICD-10-CM

## 2020-04-02 MED ORDER — DICLOFENAC SODIUM 75 MG PO TBEC: 75.0000 mg | DELAYED_RELEASE_TABLET | Freq: Two times a day (BID) | ORAL | 1 refills | Status: DC | PRN

## 2020-04-02 MED ORDER — TIZANIDINE HCL 4 MG PO TABS
ORAL_TABLET | ORAL | 0 refills | Status: DC
Start: 2020-04-02 — End: 2020-08-30

## 2020-04-02 NOTE — Progress Notes (Signed)
Subjective:    Patient ID: Mary Blevins, female    DOB: 12/04/1945, 75 y.o.   MRN: [id number]  Patient presents today with bilateral knee pain.  She states that is not as much pain as it is stiffness.  Is primarily at night.  She will get out of bed to go the bathroom and her knees feel "numb".  They feel tight and stiff.  She feels like they are not as steady as they are during the day.  She has to stand there for a minute and get her knees to "loosen up" before she tries to walk to the bathroom.  She also reports pain with ambulation and stiffness particularly in her left knee.  On exam today she has palpable crepitus in the left knee and a small effusion in the right knee.  She has mild tenderness to palpation over either joint line.  However there is no laxity to varus or valgus stress bilaterally.  She has negative drawer signs bilaterally.  There is a negative Apley grind bilaterally.  I believe most of this is due to osteoarthritis.  She also complains of pain and stiffness in the first [facility] joint bilaterally.  This is also primarily at night when her hands feel stiff and sore.  She is tried applying topical diclofenac and saw some benefit.  She is also taking extra strength Tylenol with some benefit. Past Medical History:  Diagnosis Date  . Arthritis   . COPD (chronic obstructive pulmonary disease) ([place])   . Elevated lipids   . Generalized headaches   . History of pneumonia   . Hyperlipidemia   . Hypothyroidism   . MVP (mitral valve prolapse)    palpitations  . Osteopenia   . Seasonal allergies   . Sinusitis   . Tubular adenoma of colon    Past Surgical History:  Procedure Laterality Date  . COLONOSCOPY WITH PROPOFOL N/A 01/06/2016   Procedure: COLONOSCOPY WITH PROPOFOL;  Surgeon: Doran Stabler, MD;  Location: [facility];  Service: Gastroenterology;  Laterality: N/A;  . DILATION AND CURETTAGE OF UTERUS    . THYROIDECTOMY    . TONSILLECTOMY     Current Outpatient  Medications on File Prior to Visit  Medication Sig Dispense Refill  . albuterol (PROVENTIL) (2.5 MG/3ML) 0.083% nebulizer solution Take 3 mLs (2.5 mg total) by nebulization every 6 (six) hours as needed for wheezing or shortness of breath. 150 mL 3  . ALPRAZolam (XANAX) 0.5 MG tablet Take 1 tablet (0.5 mg total) by mouth 3 (three) times daily as needed. 30 tablet 0  . budesonide-formoterol (SYMBICORT) 160-4.5 MCG/ACT inhaler Inhale 2 puffs into the lungs 2 (two) times daily. 3 Inhaler 3  . fluticasone (FLONASE) 50 MCG/ACT nasal spray SPRAY 2 SPRAYS INTO EACH NOSTRIL EVERY DAY 16 g 2  . hydrOXYzine (ATARAX/VISTARIL) 25 MG tablet Take 1 tablet (25 mg total) by mouth 3 (three) times daily as needed for itching. 30 tablet 6  . levothyroxine (SYNTHROID) 50 MCG tablet TAKE 1 TABLET BY MOUTH DAILY BEFORE BREAKFAST 90 tablet 3  . meclizine (ANTIVERT) 25 MG tablet Take 1 tablet (25 mg total) by mouth 3 (three) times daily as needed for dizziness. 30 tablet 0  . Propylene Glycol-Glycerin 0.6-0.6 % SOLN Apply to eye.    . Tiotropium Bromide Monohydrate (SPIRIVA RESPIMAT) 2.5 MCG/ACT AERS Inhale 2 puffs into the lungs daily. 4 g 0  . verapamil (CALAN-SR) 120 MG CR tablet Take 1 tablet (120 mg total) by  mouth daily. 90 tablet 3   No current facility-administered medications on file prior to visit.   Allergies  Allergen Reactions  . Meloxicam Other (See Comments)    BAD DREAMS, SAD THOUGHTS   Social History   Socioeconomic History  . Marital status: Widowed    Spouse name: Not on file  . Number of children: 0  . Years of education: Not on file  . Highest education level: Not on file  Occupational History    Employer: [employer]  Tobacco Use  . Smoking status: Former Smoker    Quit date: 07/24/2005    Years since quitting: 14.7  . Smokeless tobacco: Never Used  Vaping Use  . Vaping Use: Never used  Substance and Sexual Activity  . Alcohol use: Yes    Comment: rare  . Drug use:  No  . Sexual activity: Not on file  Other Topics Concern  . Not on file  Social History Narrative  . Not on file   Social Determinants of Health   Financial Resource Strain: Not on file  Food Insecurity: Not on file  Transportation Needs: Not on file  Physical Activity: Not on file  Stress: Not on file  Social Connections: Not on file  Intimate Partner Violence: Not on file    Past Medical History:  Diagnosis Date  . Arthritis   . COPD (chronic obstructive pulmonary disease) ([place])   . Elevated lipids   . Generalized headaches   . History of pneumonia   . Hyperlipidemia   . Hypothyroidism   . MVP (mitral valve prolapse)    palpitations  . Osteopenia   . Seasonal allergies   . Sinusitis   . Tubular adenoma of colon    Past Surgical History:  Procedure Laterality Date  . COLONOSCOPY WITH PROPOFOL N/A 01/06/2016   Procedure: COLONOSCOPY WITH PROPOFOL;  Surgeon: Doran Stabler, MD;  Location: [facility];  Service: Gastroenterology;  Laterality: N/A;  . DILATION AND CURETTAGE OF UTERUS    . THYROIDECTOMY    . TONSILLECTOMY     Current Outpatient Medications on File Prior to Visit  Medication Sig Dispense Refill  . albuterol (PROVENTIL) (2.5 MG/3ML) 0.083% nebulizer solution Take 3 mLs (2.5 mg total) by nebulization every 6 (six) hours as needed for wheezing or shortness of breath. 150 mL 3  . ALPRAZolam (XANAX) 0.5 MG tablet Take 1 tablet (0.5 mg total) by mouth 3 (three) times daily as needed. 30 tablet 0  . budesonide-formoterol (SYMBICORT) 160-4.5 MCG/ACT inhaler Inhale 2 puffs into the lungs 2 (two) times daily. 3 Inhaler 3  . fluticasone (FLONASE) 50 MCG/ACT nasal spray SPRAY 2 SPRAYS INTO EACH NOSTRIL EVERY DAY 16 g 2  . hydrOXYzine (ATARAX/VISTARIL) 25 MG tablet Take 1 tablet (25 mg total) by mouth 3 (three) times daily as needed for itching. 30 tablet 6  . levothyroxine (SYNTHROID) 50 MCG tablet TAKE 1 TABLET BY MOUTH DAILY BEFORE BREAKFAST 90 tablet 3  .  meclizine (ANTIVERT) 25 MG tablet Take 1 tablet (25 mg total) by mouth 3 (three) times daily as needed for dizziness. 30 tablet 0  . Propylene Glycol-Glycerin 0.6-0.6 % SOLN Apply to eye.    . Tiotropium Bromide Monohydrate (SPIRIVA RESPIMAT) 2.5 MCG/ACT AERS Inhale 2 puffs into the lungs daily. 4 g 0  . verapamil (CALAN-SR) 120 MG CR tablet Take 1 tablet (120 mg total) by mouth daily. 90 tablet 3   No current facility-administered medications on file prior to visit.  Allergies  Allergen Reactions  . Meloxicam Other (See Comments)    BAD DREAMS, SAD THOUGHTS   Social History   Socioeconomic History  . Marital status: Widowed    Spouse name: Not on file  . Number of children: 0  . Years of education: Not on file  . Highest education level: Not on file  Occupational History    Employer: [employer]  Tobacco Use  . Smoking status: Former Smoker    Quit date: 07/24/2005    Years since quitting: 14.7  . Smokeless tobacco: Never Used  Vaping Use  . Vaping Use: Never used  Substance and Sexual Activity  . Alcohol use: Yes    Comment: rare  . Drug use: No  . Sexual activity: Not on file  Other Topics Concern  . Not on file  Social History Narrative  . Not on file   Social Determinants of Health   Financial Resource Strain: Not on file  Food Insecurity: Not on file  Transportation Needs: Not on file  Physical Activity: Not on file  Stress: Not on file  Social Connections: Not on file  Intimate Partner Violence: Not on file      Review of Systems  All other systems reviewed and are negative.      Objective:   Physical Exam Vitals reviewed.  Constitutional:      General: She is not in acute distress.    Appearance: She is well-developed. She is not diaphoretic.  Neck:     Vascular: No JVD.  Cardiovascular:     Rate and Rhythm: Normal rate and regular rhythm.     Heart sounds: Normal heart sounds.  Pulmonary:     Effort: Pulmonary effort is  normal.     Breath sounds: Normal breath sounds.  Musculoskeletal:     Right upper arm: No tenderness.     Right elbow: No swelling, deformity or effusion. Normal range of motion. No tenderness.     Cervical back: Neck supple.     Right knee: Effusion present. Normal range of motion. Tenderness present over the medial joint line and lateral joint line. No LCL laxity, MCL laxity, ACL laxity or PCL laxity.     Left knee: Crepitus present. Normal range of motion. Tenderness present over the medial joint line and lateral joint line. No LCL laxity, MCL laxity, ACL laxity or PCL laxity.          Assessment & Plan:  Primary osteoarthritis involving multiple joints  Patient will try diclofenac 75 mg p.o. twice daily.  We discussed the potential GI toxicity and renal toxicity from the medication and I encouraged her to use it sparingly.  She can also continue to use Tylenol as needed for pain.

## 2020-04-12 ENCOUNTER — Telehealth: Payer: Self-pay | Admitting: *Deleted

## 2020-04-12 NOTE — Telephone Encounter (Signed)
Received call from patient.   Reports that she requires new orders for nebulizer supplies and tubing sent to [facility].   Order placed on desk awaiting signature to fax.

## 2020-04-14 ENCOUNTER — Telehealth: Payer: Self-pay

## 2020-04-14 NOTE — Telephone Encounter (Signed)
We do not have samples of patient current prescriptions or comparable inhalers.   Call placed to patient and patient made aware.

## 2020-04-14 NOTE — Telephone Encounter (Signed)
Patient called asking if there are any samples of inhalers that she can be given in the office. Cb# [id number].

## 2020-04-19 DIAGNOSIS — J449 Chronic obstructive pulmonary disease, unspecified: Secondary | ICD-10-CM | POA: Diagnosis not present

## 2020-05-03 ENCOUNTER — Other Ambulatory Visit: Payer: Self-pay

## 2020-05-03 ENCOUNTER — Encounter: Payer: Self-pay | Admitting: Family Medicine

## 2020-05-03 ENCOUNTER — Ambulatory Visit (INDEPENDENT_AMBULATORY_CARE_PROVIDER_SITE_OTHER): Payer: Medicare HMO | Admitting: Family Medicine

## 2020-05-03 VITALS — BP 128/62 | HR 82 | Temp 98.2°F | Resp 14 | Ht 65.0 in | Wt 171.0 lb

## 2020-05-03 DIAGNOSIS — M25561 Pain in right knee: Secondary | ICD-10-CM

## 2020-05-03 DIAGNOSIS — M25562 Pain in left knee: Secondary | ICD-10-CM

## 2020-05-03 NOTE — Progress Notes (Signed)
Subjective:    Patient ID: Mary Blevins, female    DOB: [birth year], 75 y.o.   MRN: [id number]   Patient complains of bilateral knee pain.  She states that her left knee hurts worse than her right knee.  She has been taking meloxicam every day for the last 2 to 3 weeks with minimal improvement.  She is also been using BC powders or Goody powders she is not sure which one.  She reports pain with range of motion in both knees.  It hurts when I bend and extend her knees passively.  She also has tenderness to palpation along both joint lines bilaterally.  There is no erythema or warmth or effusion.  There is no laxity to varus or valgus stress.  There is no instability in the joints however she states that they are sore and they hurt to walk on.  She denies any locking.  They do not give way underneath her. Past Medical History:  Diagnosis Date  . Arthritis   . COPD (chronic obstructive pulmonary disease) ([place])   . Elevated lipids   . Generalized headaches   . History of pneumonia   . Hyperlipidemia   . Hypothyroidism   . MVP (mitral valve prolapse)    palpitations  . Osteopenia   . Seasonal allergies   . Sinusitis   . Tubular adenoma of colon    Past Surgical History:  Procedure Laterality Date  . COLONOSCOPY WITH PROPOFOL N/A 01/06/2016   Procedure: COLONOSCOPY WITH PROPOFOL;  Surgeon: Doran Stabler, MD;  Location: [facility];  Service: Gastroenterology;  Laterality: N/A;  . DILATION AND CURETTAGE OF UTERUS    . THYROIDECTOMY    . TONSILLECTOMY     Current Outpatient Medications on File Prior to Visit  Medication Sig Dispense Refill  . albuterol (PROVENTIL) (2.5 MG/3ML) 0.083% nebulizer solution Take 3 mLs (2.5 mg total) by nebulization every 6 (six) hours as needed for wheezing or shortness of breath. 150 mL 3  . ALPRAZolam (XANAX) 0.5 MG tablet Take 1 tablet (0.5 mg total) by mouth 3 (three) times daily as needed. 30 tablet 0  . budesonide-formoterol (SYMBICORT)  160-4.5 MCG/ACT inhaler Inhale 2 puffs into the lungs 2 (two) times daily. 3 Inhaler 3  . fluticasone (FLONASE) 50 MCG/ACT nasal spray SPRAY 2 SPRAYS INTO EACH NOSTRIL EVERY DAY 16 g 2  . hydrOXYzine (ATARAX/VISTARIL) 25 MG tablet Take 1 tablet (25 mg total) by mouth 3 (three) times daily as needed for itching. 30 tablet 6  . levothyroxine (SYNTHROID) 50 MCG tablet TAKE 1 TABLET BY MOUTH DAILY BEFORE BREAKFAST 90 tablet 3  . meclizine (ANTIVERT) 25 MG tablet Take 1 tablet (25 mg total) by mouth 3 (three) times daily as needed for dizziness. 30 tablet 0  . Propylene Glycol-Glycerin 0.6-0.6 % SOLN Apply to eye.    . Tiotropium Bromide Monohydrate (SPIRIVA RESPIMAT) 2.5 MCG/ACT AERS Inhale 2 puffs into the lungs daily. 4 g 0  . tiZANidine (ZANAFLEX) 4 MG tablet TAKE 1 TABLET (4 MG TOTAL) BY MOUTH EVERY 6 (SIX) HOURS AS NEEDED FOR MUSCLE SPASMS. 30 tablet 0  . verapamil (CALAN-SR) 120 MG CR tablet Take 1 tablet (120 mg total) by mouth daily. 90 tablet 3   No current facility-administered medications on file prior to visit.   Allergies  Allergen Reactions  . Meloxicam Other (See Comments)    BAD DREAMS, SAD THOUGHTS   Social History   Socioeconomic History  . Marital status: Widowed  Spouse name: Not on file  . Number of children: 0  . Years of education: Not on file  . Highest education level: Not on file  Occupational History    Employer: [employer]  Tobacco Use  . Smoking status: Former Smoker    Quit date: 07/24/2005    Years since quitting: 14.7  . Smokeless tobacco: Never Used  Vaping Use  . Vaping Use: Never used  Substance and Sexual Activity  . Alcohol use: Yes    Comment: rare  . Drug use: No  . Sexual activity: Not on file  Other Topics Concern  . Not on file  Social History Narrative  . Not on file   Social Determinants of Health   Financial Resource Strain: Not on file  Food Insecurity: Not on file  Transportation Needs: Not on file  Physical  Activity: Not on file  Stress: Not on file  Social Connections: Not on file  Intimate Partner Violence: Not on file    Past Medical History:  Diagnosis Date  . Arthritis   . COPD (chronic obstructive pulmonary disease) ([place])   . Elevated lipids   . Generalized headaches   . History of pneumonia   . Hyperlipidemia   . Hypothyroidism   . MVP (mitral valve prolapse)    palpitations  . Osteopenia   . Seasonal allergies   . Sinusitis   . Tubular adenoma of colon    Past Surgical History:  Procedure Laterality Date  . COLONOSCOPY WITH PROPOFOL N/A 01/06/2016   Procedure: COLONOSCOPY WITH PROPOFOL;  Surgeon: Doran Stabler, MD;  Location: [facility];  Service: Gastroenterology;  Laterality: N/A;  . DILATION AND CURETTAGE OF UTERUS    . THYROIDECTOMY    . TONSILLECTOMY     Current Outpatient Medications on File Prior to Visit  Medication Sig Dispense Refill  . albuterol (PROVENTIL) (2.5 MG/3ML) 0.083% nebulizer solution Take 3 mLs (2.5 mg total) by nebulization every 6 (six) hours as needed for wheezing or shortness of breath. 150 mL 3  . ALPRAZolam (XANAX) 0.5 MG tablet Take 1 tablet (0.5 mg total) by mouth 3 (three) times daily as needed. 30 tablet 0  . budesonide-formoterol (SYMBICORT) 160-4.5 MCG/ACT inhaler Inhale 2 puffs into the lungs 2 (two) times daily. 3 Inhaler 3  . fluticasone (FLONASE) 50 MCG/ACT nasal spray SPRAY 2 SPRAYS INTO EACH NOSTRIL EVERY DAY 16 g 2  . hydrOXYzine (ATARAX/VISTARIL) 25 MG tablet Take 1 tablet (25 mg total) by mouth 3 (three) times daily as needed for itching. 30 tablet 6  . levothyroxine (SYNTHROID) 50 MCG tablet TAKE 1 TABLET BY MOUTH DAILY BEFORE BREAKFAST 90 tablet 3  . meclizine (ANTIVERT) 25 MG tablet Take 1 tablet (25 mg total) by mouth 3 (three) times daily as needed for dizziness. 30 tablet 0  . Propylene Glycol-Glycerin 0.6-0.6 % SOLN Apply to eye.    . Tiotropium Bromide Monohydrate (SPIRIVA RESPIMAT) 2.5 MCG/ACT AERS Inhale 2 puffs  into the lungs daily. 4 g 0  . tiZANidine (ZANAFLEX) 4 MG tablet TAKE 1 TABLET (4 MG TOTAL) BY MOUTH EVERY 6 (SIX) HOURS AS NEEDED FOR MUSCLE SPASMS. 30 tablet 0  . verapamil (CALAN-SR) 120 MG CR tablet Take 1 tablet (120 mg total) by mouth daily. 90 tablet 3   No current facility-administered medications on file prior to visit.   Allergies  Allergen Reactions  . Meloxicam Other (See Comments)    BAD DREAMS, SAD THOUGHTS   Social History  Socioeconomic History  . Marital status: Widowed    Spouse name: Not on file  . Number of children: 0  . Years of education: Not on file  . Highest education level: Not on file  Occupational History    Employer: [employer]  Tobacco Use  . Smoking status: Former Smoker    Quit date: 07/24/2005    Years since quitting: 14.7  . Smokeless tobacco: Never Used  Vaping Use  . Vaping Use: Never used  Substance and Sexual Activity  . Alcohol use: Yes    Comment: rare  . Drug use: No  . Sexual activity: Not on file  Other Topics Concern  . Not on file  Social History Narrative  . Not on file   Social Determinants of Health   Financial Resource Strain: Not on file  Food Insecurity: Not on file  Transportation Needs: Not on file  Physical Activity: Not on file  Stress: Not on file  Social Connections: Not on file  Intimate Partner Violence: Not on file      Review of Systems  All other systems reviewed and are negative.      Objective:   Physical Exam Vitals reviewed.  Constitutional:      General: She is not in acute distress.    Appearance: She is well-developed. She is not diaphoretic.  Neck:     Vascular: No JVD.  Cardiovascular:     Rate and Rhythm: Normal rate and regular rhythm.     Heart sounds: Normal heart sounds.  Pulmonary:     Effort: Pulmonary effort is normal. No respiratory distress.     Breath sounds: Normal breath sounds. No stridor. No wheezing or rales.  Chest:     Chest wall: No  tenderness.  Abdominal:     General: Bowel sounds are normal.     Palpations: Abdomen is soft.  Musculoskeletal:     Cervical back: Neck supple.     Right knee: No swelling, effusion or crepitus. Normal range of motion. Tenderness present over the medial joint line and lateral joint line. No LCL laxity, MCL laxity or ACL laxity.     Left knee: Crepitus present. No swelling or effusion. Normal range of motion. Tenderness present over the medial joint line and lateral joint line.           Assessment & Plan:  Acute pain of both knees   I suspect osteoarthritis causing her knee pain.  She requests a cortisone injection in both knees.  Using sterile technique, I injected the left knee with 2 cc lidocaine, 2 cc of Marcaine, 2 cc of 40 mg/mL Kenalog.  The patient tolerated procedure well without complication.  Next, using sterile technique, I injected the right knee with 2 cc lidocaine, 2 cc of Marcaine, and 2 cc of 40 mg/mL Kenalog.  She can continue to take diclofenac as needed.

## 2020-05-10 ENCOUNTER — Other Ambulatory Visit: Payer: Self-pay | Admitting: *Deleted

## 2020-05-10 DIAGNOSIS — J441 Chronic obstructive pulmonary disease with (acute) exacerbation: Secondary | ICD-10-CM

## 2020-05-10 DIAGNOSIS — R0602 Shortness of breath: Secondary | ICD-10-CM

## 2020-05-10 MED ORDER — ALBUTEROL SULFATE (2.5 MG/3ML) 0.083% IN NEBU: 2.5000 mg | INHALATION_SOLUTION | Freq: Four times a day (QID) | RESPIRATORY_TRACT | 3 refills | Status: DC | PRN

## 2020-05-20 DIAGNOSIS — J449 Chronic obstructive pulmonary disease, unspecified: Secondary | ICD-10-CM | POA: Diagnosis not present

## 2020-06-03 ENCOUNTER — Telehealth: Payer: Self-pay | Admitting: Family Medicine

## 2020-06-03 DIAGNOSIS — J441 Chronic obstructive pulmonary disease with (acute) exacerbation: Secondary | ICD-10-CM

## 2020-06-03 DIAGNOSIS — R0602 Shortness of breath: Secondary | ICD-10-CM

## 2020-06-03 MED ORDER — ALBUTEROL SULFATE (2.5 MG/3ML) 0.083% IN NEBU
2.5000 mg | INHALATION_SOLUTION | Freq: Four times a day (QID) | RESPIRATORY_TRACT | 3 refills | Status: DC | PRN
Start: 2020-06-03 — End: 2021-06-07

## 2020-06-03 NOTE — Telephone Encounter (Signed)
Pt called stating that she needs a refill of albuterol (PROVENTIL) (2.5 MG/3ML) 0.083% nebulizer solution  Sent to [facility] in [place]. Pt also stated that she is only receiving 2 boxes, and wants to know if she could go back to 5 boxes a month. Please call  [phone]

## 2020-06-03 NOTE — Telephone Encounter (Signed)
Increased quantity to 366mL.   Prescription sent to pharmacy.

## 2020-06-04 ENCOUNTER — Other Ambulatory Visit: Payer: Self-pay | Admitting: *Deleted

## 2020-06-04 MED ORDER — DICLOFENAC SODIUM 75 MG PO TBEC: 75.0000 mg | DELAYED_RELEASE_TABLET | Freq: Two times a day (BID) | ORAL | 1 refills | Status: DC | PRN

## 2020-06-04 NOTE — Telephone Encounter (Signed)
Received fax requesting refill on Diclofenac to mail order.   Ok to refill?

## 2020-06-08 ENCOUNTER — Telehealth: Payer: Self-pay | Admitting: Family Medicine

## 2020-06-08 ENCOUNTER — Telehealth: Payer: Self-pay

## 2020-06-08 ENCOUNTER — Other Ambulatory Visit: Payer: Self-pay | Admitting: *Deleted

## 2020-06-08 MED ORDER — HYDROCODONE-ACETAMINOPHEN 5-325 MG PO TABS: 1.0000 | ORAL_TABLET | Freq: Four times a day (QID) | ORAL | 0 refills | Status: DC | PRN

## 2020-06-08 NOTE — Telephone Encounter (Signed)
Pt called to report pain in the growing area, thinks it may be from lifting. She is prescribed hydrocodone medication. Pt asked will this medication help with the pain she is feeling. Assured pt it would and to only take the medication as directed. Follow up with pcp if pain continues

## 2020-06-08 NOTE — Telephone Encounter (Signed)
Duplicate message. 

## 2020-06-08 NOTE — Telephone Encounter (Signed)
Received call from patient.   Requested refill on Hydrocodone/APAP.   Ok to refill??  Last office visit 05/03/2020.  Last refill 02/26/2020.

## 2020-06-08 NOTE — Telephone Encounter (Signed)
Pt called stating that she needs a refill of  HYDROcodone-acetaminophen (NORCO) 5-325 MG tablet [[id number]]  [facility] to [facility].  Cb#: [phone]

## 2020-06-10 ENCOUNTER — Telehealth: Payer: Self-pay | Admitting: Pulmonary Disease

## 2020-06-10 NOTE — Telephone Encounter (Signed)
Called and spoke with patient who stated she would like sample of spiriva to hold over until appointment with Dr Halford Chessman. Confirmed upcoming scheduled office visit with Dr Halford Chessman at the [place] office on 06/30/20 at 9:45am. Patient stated she will be coming to this appointment and made aware that she needs to come to the appointment in order to get any samples moving forward since its been a year since she has seen Dr Halford Chessman. Patient expressed full understanding and stated she will come to [place] office tomorrow 06/11/20 to pick up sample. Will leave encounter open until patient picks sample up.

## 2020-06-11 ENCOUNTER — Ambulatory Visit (HOSPITAL_COMMUNITY)
Admission: RE | Admit: 2020-06-11 | Discharge: 2020-06-11 | Disposition: A | Discharge status: Home / Self Care | Payer: Medicare HMO | Source: Ambulatory Visit | Attending: Family Medicine | Admitting: Family Medicine

## 2020-06-11 ENCOUNTER — Other Ambulatory Visit: Payer: Self-pay

## 2020-06-11 ENCOUNTER — Ambulatory Visit (INDEPENDENT_AMBULATORY_CARE_PROVIDER_SITE_OTHER): Payer: Medicare HMO | Admitting: Family Medicine

## 2020-06-11 ENCOUNTER — Encounter: Payer: Self-pay | Admitting: Family Medicine

## 2020-06-11 VITALS — BP 132/68 | HR 78 | Temp 97.9°F | Resp 16 | Ht 65.0 in | Wt 170.0 lb

## 2020-06-11 DIAGNOSIS — M25551 Pain in right hip: Secondary | ICD-10-CM | POA: Insufficient documentation

## 2020-06-11 MED ORDER — SPIRIVA RESPIMAT 2.5 MCG/ACT IN AERS: 2.0000 | INHALATION_SPRAY | Freq: Every day | RESPIRATORY_TRACT | 0 refills | Status: DC

## 2020-06-11 MED ORDER — PREDNISONE 20 MG PO TABS: ORAL_TABLET | ORAL | 0 refills | Status: DC

## 2020-06-11 NOTE — Telephone Encounter (Signed)
Patient came and picked up sample of Spiriva. Nothing further needed at this time.

## 2020-06-11 NOTE — Progress Notes (Signed)
Subjective:    Patient ID: Mary Blevins, female    DOB: May 08, 1945, 75 y.o.   MRN: [id number]   Patient presents with a 1 week history of severe pain in her right hip.  She denies any falls or injuries however she walks in today with a limp.  She complains of anterior hip pain.  She points to her inguinal canal as the source of the pain.  She states that it hurts to stand or walk.  On examination, the patient screams with flexion.  She screams when I try to gently externally rotate the hip.  She also jumps and screams when I internally rotate the hip.  However the patient is able to bear weight on her hip.  She does have some tenderness to palpation in the right posterior gluteus area.  She is mildly tender to palpation in the anterior hip.  However, range of motion of the hip elicits all the pain.  Past Medical History:  Diagnosis Date  . Arthritis   . COPD (chronic obstructive pulmonary disease) ([place])   . Elevated lipids   . Generalized headaches   . History of pneumonia   . Hyperlipidemia   . Hypothyroidism   . MVP (mitral valve prolapse)    palpitations  . Osteopenia   . Seasonal allergies   . Sinusitis   . Tubular adenoma of colon    Past Surgical History:  Procedure Laterality Date  . COLONOSCOPY WITH PROPOFOL N/A 01/06/2016   Procedure: COLONOSCOPY WITH PROPOFOL;  Surgeon: Doran Stabler, MD;  Location: [facility];  Service: Gastroenterology;  Laterality: N/A;  . DILATION AND CURETTAGE OF UTERUS    . THYROIDECTOMY    . TONSILLECTOMY     Current Outpatient Medications on File Prior to Visit  Medication Sig Dispense Refill  . albuterol (PROVENTIL) (2.5 MG/3ML) 0.083% nebulizer solution Take 3 mLs (2.5 mg total) by nebulization every 6 (six) hours as needed for wheezing or shortness of breath. 360 mL 3  . ALPRAZolam (XANAX) 0.5 MG tablet Take 1 tablet (0.5 mg total) by mouth 3 (three) times daily as needed. 30 tablet 0  . budesonide-formoterol (SYMBICORT) 160-4.5  MCG/ACT inhaler Inhale 2 puffs into the lungs 2 (two) times daily. 3 Inhaler 3  . diclofenac (VOLTAREN) 75 MG EC tablet Take 1 tablet (75 mg total) by mouth 2 (two) times daily as needed (arthritis). 60 tablet 1  . fluticasone (FLONASE) 50 MCG/ACT nasal spray SPRAY 2 SPRAYS INTO EACH NOSTRIL EVERY DAY 16 g 2  . HYDROcodone-acetaminophen (NORCO) 5-325 MG tablet Take 1 tablet by mouth every 6 (six) hours as needed for moderate pain. 30 tablet 0  . hydrOXYzine (ATARAX/VISTARIL) 25 MG tablet Take 1 tablet (25 mg total) by mouth 3 (three) times daily as needed for itching. 30 tablet 6  . levothyroxine (SYNTHROID) 50 MCG tablet TAKE 1 TABLET BY MOUTH DAILY BEFORE BREAKFAST 90 tablet 3  . meclizine (ANTIVERT) 25 MG tablet Take 1 tablet (25 mg total) by mouth 3 (three) times daily as needed for dizziness. 30 tablet 0  . Propylene Glycol-Glycerin 0.6-0.6 % SOLN Apply to eye.    . Tiotropium Bromide Monohydrate (SPIRIVA RESPIMAT) 2.5 MCG/ACT AERS Inhale 2 puffs into the lungs daily. 4 g 0  . tiZANidine (ZANAFLEX) 4 MG tablet TAKE 1 TABLET (4 MG TOTAL) BY MOUTH EVERY 6 (SIX) HOURS AS NEEDED FOR MUSCLE SPASMS. 30 tablet 0  . verapamil (CALAN-SR) 120 MG CR tablet Take 1 tablet (120 mg total)  by mouth daily. 90 tablet 3   No current facility-administered medications on file prior to visit.   Allergies  Allergen Reactions  . Meloxicam Other (See Comments)    BAD DREAMS, SAD THOUGHTS   Social History   Socioeconomic History  . Marital status: Widowed    Spouse name: Not on file  . Number of children: 0  . Years of education: Not on file  . Highest education level: Not on file  Occupational History    Employer: [employer]  Tobacco Use  . Smoking status: Former Smoker    Quit date: 07/24/2005    Years since quitting: 14.8  . Smokeless tobacco: Never Used  Vaping Use  . Vaping Use: Never used  Substance and Sexual Activity  . Alcohol use: Yes    Comment: rare  . Drug use: No  .  Sexual activity: Not on file  Other Topics Concern  . Not on file  Social History Narrative  . Not on file   Social Determinants of Health   Financial Resource Strain: Not on file  Food Insecurity: Not on file  Transportation Needs: Not on file  Physical Activity: Not on file  Stress: Not on file  Social Connections: Not on file  Intimate Partner Violence: Not on file    Past Medical History:  Diagnosis Date  . Arthritis   . COPD (chronic obstructive pulmonary disease) ([place])   . Elevated lipids   . Generalized headaches   . History of pneumonia   . Hyperlipidemia   . Hypothyroidism   . MVP (mitral valve prolapse)    palpitations  . Osteopenia   . Seasonal allergies   . Sinusitis   . Tubular adenoma of colon    Past Surgical History:  Procedure Laterality Date  . COLONOSCOPY WITH PROPOFOL N/A 01/06/2016   Procedure: COLONOSCOPY WITH PROPOFOL;  Surgeon: Doran Stabler, MD;  Location: [facility];  Service: Gastroenterology;  Laterality: N/A;  . DILATION AND CURETTAGE OF UTERUS    . THYROIDECTOMY    . TONSILLECTOMY     Current Outpatient Medications on File Prior to Visit  Medication Sig Dispense Refill  . albuterol (PROVENTIL) (2.5 MG/3ML) 0.083% nebulizer solution Take 3 mLs (2.5 mg total) by nebulization every 6 (six) hours as needed for wheezing or shortness of breath. 360 mL 3  . ALPRAZolam (XANAX) 0.5 MG tablet Take 1 tablet (0.5 mg total) by mouth 3 (three) times daily as needed. 30 tablet 0  . budesonide-formoterol (SYMBICORT) 160-4.5 MCG/ACT inhaler Inhale 2 puffs into the lungs 2 (two) times daily. 3 Inhaler 3  . diclofenac (VOLTAREN) 75 MG EC tablet Take 1 tablet (75 mg total) by mouth 2 (two) times daily as needed (arthritis). 60 tablet 1  . fluticasone (FLONASE) 50 MCG/ACT nasal spray SPRAY 2 SPRAYS INTO EACH NOSTRIL EVERY DAY 16 g 2  . HYDROcodone-acetaminophen (NORCO) 5-325 MG tablet Take 1 tablet by mouth every 6 (six) hours as needed for moderate  pain. 30 tablet 0  . hydrOXYzine (ATARAX/VISTARIL) 25 MG tablet Take 1 tablet (25 mg total) by mouth 3 (three) times daily as needed for itching. 30 tablet 6  . levothyroxine (SYNTHROID) 50 MCG tablet TAKE 1 TABLET BY MOUTH DAILY BEFORE BREAKFAST 90 tablet 3  . meclizine (ANTIVERT) 25 MG tablet Take 1 tablet (25 mg total) by mouth 3 (three) times daily as needed for dizziness. 30 tablet 0  . Propylene Glycol-Glycerin 0.6-0.6 % SOLN Apply to eye.    Marland Kitchen  Tiotropium Bromide Monohydrate (SPIRIVA RESPIMAT) 2.5 MCG/ACT AERS Inhale 2 puffs into the lungs daily. 4 g 0  . tiZANidine (ZANAFLEX) 4 MG tablet TAKE 1 TABLET (4 MG TOTAL) BY MOUTH EVERY 6 (SIX) HOURS AS NEEDED FOR MUSCLE SPASMS. 30 tablet 0  . verapamil (CALAN-SR) 120 MG CR tablet Take 1 tablet (120 mg total) by mouth daily. 90 tablet 3   No current facility-administered medications on file prior to visit.   Allergies  Allergen Reactions  . Meloxicam Other (See Comments)    BAD DREAMS, SAD THOUGHTS   Social History   Socioeconomic History  . Marital status: Widowed    Spouse name: Not on file  . Number of children: 0  . Years of education: Not on file  . Highest education level: Not on file  Occupational History    Employer: [employer]  Tobacco Use  . Smoking status: Former Smoker    Quit date: 07/24/2005    Years since quitting: 14.8  . Smokeless tobacco: Never Used  Vaping Use  . Vaping Use: Never used  Substance and Sexual Activity  . Alcohol use: Yes    Comment: rare  . Drug use: No  . Sexual activity: Not on file  Other Topics Concern  . Not on file  Social History Narrative  . Not on file   Social Determinants of Health   Financial Resource Strain: Not on file  Food Insecurity: Not on file  Transportation Needs: Not on file  Physical Activity: Not on file  Stress: Not on file  Social Connections: Not on file  Intimate Partner Violence: Not on file      Review of Systems  All other systems  reviewed and are negative.      Objective:   Physical Exam Vitals reviewed.  Constitutional:      General: She is not in acute distress.    Appearance: She is well-developed. She is not diaphoretic.  Neck:     Vascular: No JVD.  Cardiovascular:     Rate and Rhythm: Normal rate and regular rhythm.     Heart sounds: Normal heart sounds.  Pulmonary:     Effort: Pulmonary effort is normal. No respiratory distress.     Breath sounds: Normal breath sounds. No stridor. No wheezing or rales.  Chest:     Chest wall: No tenderness.  Abdominal:     General: Bowel sounds are normal.     Palpations: Abdomen is soft.  Musculoskeletal:     Cervical back: Neck supple.     Right hip: Tenderness present. No deformity, lacerations, bony tenderness or crepitus. Decreased range of motion. Decreased strength.     Right knee: No crepitus. No LCL laxity, MCL laxity or ACL laxity.     Left knee: Crepitus present.       Legs:           Assessment & Plan:  Right hip pain - Plan: DG Hip Unilat W OR W/O Pelvis 2-3 Views Right, predniSONE (DELTASONE) 20 MG tablet  I suspect the patient has osteoarthritis in the right hip that has become exacerbated.  Recommended a prednisone taper pack as she is already tried muscle relaxers and NSAIDs and is actually taking narcotics at home to help alleviate the pain.  Furthermore I want to send the patient immediately for an x-ray to rule out any sign of a fracture however she denies any falls or trauma.  Reassess in 1 week or sooner if worsening.  Plan  would also be changed based on the results of the x-ray of the hip.

## 2020-06-14 ENCOUNTER — Telehealth: Payer: Self-pay

## 2020-06-14 ENCOUNTER — Telehealth: Payer: Self-pay | Admitting: Family Medicine

## 2020-06-14 NOTE — Telephone Encounter (Signed)
Awaiting results of X-ray.

## 2020-06-14 NOTE — Telephone Encounter (Signed)
Pt is now asking after the prednisone is finish, can you pls give rx for pain medication

## 2020-06-14 NOTE — Telephone Encounter (Signed)
Pt called in stating she wanted to discuss the results of her hip x-ray.  Please call  Cb#: [phone]

## 2020-06-14 NOTE — Telephone Encounter (Signed)
Xray shows mild arthritis in the right hip.

## 2020-06-14 NOTE — Telephone Encounter (Signed)
Give the prednisone a chance first before we take narcotics.

## 2020-06-16 ENCOUNTER — Telehealth: Payer: Self-pay | Admitting: *Deleted

## 2020-06-16 ENCOUNTER — Emergency Department (HOSPITAL_COMMUNITY)
Admission: EM | Admit: 2020-06-16 | Discharge: 2020-06-16 | Disposition: A | Discharge status: Home / Self Care | Payer: Medicare HMO | Attending: Emergency Medicine | Admitting: Emergency Medicine

## 2020-06-16 ENCOUNTER — Other Ambulatory Visit: Payer: Self-pay

## 2020-06-16 ENCOUNTER — Emergency Department (HOSPITAL_COMMUNITY): Payer: Medicare HMO

## 2020-06-16 ENCOUNTER — Encounter (HOSPITAL_COMMUNITY): Payer: Self-pay

## 2020-06-16 DIAGNOSIS — M5430 Sciatica, unspecified side: Secondary | ICD-10-CM | POA: Insufficient documentation

## 2020-06-16 DIAGNOSIS — Z79899 Other long term (current) drug therapy: Secondary | ICD-10-CM | POA: Diagnosis not present

## 2020-06-16 DIAGNOSIS — Z7951 Long term (current) use of inhaled steroids: Secondary | ICD-10-CM | POA: Insufficient documentation

## 2020-06-16 DIAGNOSIS — J449 Chronic obstructive pulmonary disease, unspecified: Secondary | ICD-10-CM | POA: Insufficient documentation

## 2020-06-16 DIAGNOSIS — Z85038 Personal history of other malignant neoplasm of large intestine: Secondary | ICD-10-CM | POA: Insufficient documentation

## 2020-06-16 DIAGNOSIS — M25551 Pain in right hip: Secondary | ICD-10-CM

## 2020-06-16 DIAGNOSIS — M79651 Pain in right thigh: Secondary | ICD-10-CM | POA: Diagnosis not present

## 2020-06-16 DIAGNOSIS — M5441 Lumbago with sciatica, right side: Secondary | ICD-10-CM | POA: Diagnosis not present

## 2020-06-16 DIAGNOSIS — Z87891 Personal history of nicotine dependence: Secondary | ICD-10-CM | POA: Insufficient documentation

## 2020-06-16 DIAGNOSIS — M545 Low back pain, unspecified: Secondary | ICD-10-CM | POA: Diagnosis not present

## 2020-06-16 DIAGNOSIS — E039 Hypothyroidism, unspecified: Secondary | ICD-10-CM | POA: Diagnosis not present

## 2020-06-16 MED ORDER — LORAZEPAM 2 MG/ML IJ SOLN
0.5000 mg | Freq: Once | INTRAMUSCULAR | Status: AC | PRN
  Administered 2020-06-16: 0.5 mg via INTRAVENOUS
  Filled 2020-06-16: qty 1

## 2020-06-16 MED ORDER — HYDROMORPHONE HCL 1 MG/ML IJ SOLN
0.5000 mg | Freq: Once | INTRAMUSCULAR | Status: AC
Start: 2020-06-16 — End: 2020-06-16
  Administered 2020-06-16: 0.5 mg via INTRAVENOUS
  Filled 2020-06-16: qty 1

## 2020-06-16 NOTE — ED Provider Notes (Signed)
[facility] Provider Note   CSN: [id number] Arrival date & time: 06/16/20  1237     History Chief Complaint  Patient presents with  . Leg Pain    Mary Blevins is a 75 y.o. female.  HPI   Presents to the ED with complaints of progressive pain in her right thigh and buttock for about the last 10 days.  Patient initially states she had pain in her right thigh.  She saw her primary doctors and had x-rays and was told she had mild arthritis in her hip.  Patient has been taking steroids as well as hydrocodone.  Patient's doctor referred her to orthopedics.  Unfortunately patient symptoms are worsening.  She is having intense pain in her thigh and buttock.  It hurts for her to stand and walk.  She is not having any fevers.  No bowel bladder incontinence.  No numbness or weakness.  Past Medical History:  Diagnosis Date  . Arthritis   . COPD (chronic obstructive pulmonary disease) ([place])   . Elevated lipids   . Generalized headaches   . History of pneumonia   . Hyperlipidemia   . Hypothyroidism   . MVP (mitral valve prolapse)    palpitations  . Osteopenia   . Seasonal allergies   . Sinusitis   . Tubular adenoma of colon     Patient Active Problem List   Diagnosis Date Noted  . Leg cramping 01/07/2020  . GAD (generalized anxiety disorder) 12/20/2016  . Chronic insomnia 12/20/2016  . Aortic atherosclerosis ([place]) 06/09/2016  . Benign neoplasm of ascending colon   . Benign neoplasm of transverse colon   . Benign neoplasm of sigmoid colon   . Diverticulosis of sigmoid colon   . Grade I internal hemorrhoids   . PSVT (paroxysmal supraventricular tachycardia) ([place]) 02/17/2015  . Back pain 07/22/2012  . Acute pain of left hip 07/22/2012  . Stricture and stenosis of esophagus 09/18/2011  . Hx of colonic polyps 09/18/2011  . COPD (chronic obstructive pulmonary disease) ([place]) 07/14/2010  . Generalized headaches 07/14/2010  . Osteopenia 07/14/2010  .  Hypothyroid 07/14/2010  . Elevated lipids 07/14/2010    Past Surgical History:  Procedure Laterality Date  . COLONOSCOPY WITH PROPOFOL N/A 01/06/2016   Procedure: COLONOSCOPY WITH PROPOFOL;  Surgeon: Doran Stabler, MD;  Location: [facility];  Service: Gastroenterology;  Laterality: N/A;  . DILATION AND CURETTAGE OF UTERUS    . THYROIDECTOMY    . TONSILLECTOMY       OB History   No obstetric history on file.     Family History  Problem Relation Age of Onset  . Breast cancer Maternal Aunt   . Heart attack Maternal Grandmother   . Heart attack Mother   . Heart disease Mother   . Dementia Mother   . Diabetes Brother   . Prostate cancer Maternal Grandfather   . Healthy Father   . Healthy Sister   . Heart attack Other        NIECE  . Colon cancer Neg Hx   . Esophageal cancer Neg Hx   . Rectal cancer Neg Hx   . Stomach cancer Neg Hx     Social History   Tobacco Use  . Smoking status: Former Smoker    Quit date: 07/24/2005    Years since quitting: 14.9  . Smokeless tobacco: Never Used  Vaping Use  . Vaping Use: Never used  Substance Use Topics  . Alcohol use: Yes  Comment: rare  . Drug use: No    Home Medications Prior to Admission medications   Medication Sig Start Date End Date Taking? Authorizing Provider  albuterol (PROVENTIL) (2.5 MG/3ML) 0.083% nebulizer solution Take 3 mLs (2.5 mg total) by nebulization every 6 (six) hours as needed for wheezing or shortness of breath. 06/03/20   [name], MD  ALPRAZolam Duanne Moron) 0.5 MG tablet Take 1 tablet (0.5 mg total) by mouth 3 (three) times daily as needed. 01/28/19   [name], MD  budesonide-formoterol (SYMBICORT) 160-4.5 MCG/ACT inhaler Inhale 2 puffs into the lungs 2 (two) times daily. 07/08/19   [name], MD  diclofenac (VOLTAREN) 75 MG EC tablet Take 1 tablet (75 mg total) by mouth 2 (two) times daily as needed (arthritis). 06/04/20   [name], MD  fluticasone (FLONASE) 50  MCG/ACT nasal spray SPRAY 2 SPRAYS INTO EACH NOSTRIL EVERY DAY 07/11/18   [name], MD  HYDROcodone-acetaminophen (NORCO) 5-325 MG tablet Take 1 tablet by mouth every 6 (six) hours as needed for moderate pain. 06/08/20   [name], MD  hydrOXYzine (ATARAX/VISTARIL) 25 MG tablet Take 1 tablet (25 mg total) by mouth 3 (three) times daily as needed for itching. 12/12/19   [name], MD  levothyroxine (SYNTHROID) 50 MCG tablet TAKE 1 TABLET BY MOUTH DAILY BEFORE BREAKFAST 11/28/19   [name], MD  meclizine (ANTIVERT) 25 MG tablet Take 1 tablet (25 mg total) by mouth 3 (three) times daily as needed for dizziness. 02/10/19   [name], MD  predniSONE (DELTASONE) 20 MG tablet 3 tabs poqday 1-2, 2 tabs poqday 3-4, 1 tab poqday 5-6 06/11/20   [name], MD  Propylene Glycol-Glycerin 0.6-0.6 % SOLN Apply to eye.    [provider]  Tiotropium Bromide Monohydrate (SPIRIVA RESPIMAT) 2.5 MCG/ACT AERS Inhale 2 puffs into the lungs daily. 03/10/20   Chesley Mires, MD  tiZANidine (ZANAFLEX) 4 MG tablet TAKE 1 TABLET (4 MG TOTAL) BY MOUTH EVERY 6 (SIX) HOURS AS NEEDED FOR MUSCLE SPASMS. 04/02/20   [name], MD  verapamil (CALAN-SR) 120 MG CR tablet Take 1 tablet (120 mg total) by mouth daily. 11/28/19   [name], MD    Allergies    Meloxicam  Review of Systems   Review of Systems  All other systems reviewed and are negative.   Physical Exam Updated Vital Signs BP (!) 175/95 (BP Location: Right Arm)   Pulse 72   Temp 99 F (37.2 C) (Oral)   Resp 17   Ht 1.651 m (5\' 5" )   Wt 77.1 kg   SpO2 96%   BMI 28.29 kg/m   Physical Exam Vitals and nursing note reviewed.  Constitutional:      General: She is in acute distress.     Appearance: She is well-developed.     Comments: Tearful  HENT:     Head: Normocephalic and atraumatic.     Right Ear: External ear normal.     Left Ear: External ear normal.  Eyes:     General: No scleral  icterus.       Right eye: No discharge.        Left eye: No discharge.     Conjunctiva/sclera: Conjunctivae normal.  Neck:     Trachea: No tracheal deviation.  Cardiovascular:     Rate and Rhythm: Normal rate.  Pulmonary:     Effort: Pulmonary effort is normal. No respiratory distress.  Breath sounds: No stridor.  Abdominal:     General: There is no distension.  Musculoskeletal:        General: Tenderness present. No swelling or deformity.     Cervical back: Neck supple.     Comments: Tenderness palpation right buttock, no tenderness palpation of the thigh, no swelling of the calf or thigh, extremities warm and well-perfused, sensation intact  Skin:    General: Skin is warm and dry.     Findings: No rash.  Neurological:     Mental Status: She is alert.     Cranial Nerves: Cranial nerve deficit: no gross deficits.     ED Results / Procedures / Treatments    Radiology DG Lumbar Spine Complete  Result Date: 06/16/2020 CLINICAL DATA:  Lumbar back pain. EXAM: LUMBAR SPINE - COMPLETE 4+ VIEW COMPARISON:  Radiograph 01/12/2015, lumbar MRI performed concurrently. FINDINGS: Partial lumbarization of S1, there are 5 non-rib-bearing lumbar vertebra. Levo scoliotic curvature. Straightening of normal lordosis. Trace anterolisthesis of L3 on L4. Trace retrolisthesis of L5 on S1 and L4 on L5. Vertebral body heights are normal. Diffuse disc space narrowing and endplate spurring, with progression at multiple levels from prior radiograph. Diffuse facet hypertrophy, also progressed. No evidence of fracture. Sacroiliac joints are congruent with mild degenerative change. IMPRESSION: 1. Multilevel degenerative disc disease and facet hypertrophy. 2. Levoscoliosis. Trace retrolisthesis of L4 on L5 and L5 on S1. Electronically Signed   By: Keith Rake M.D.   On: 06/16/2020 17:21   MR LUMBAR SPINE WO CONTRAST  Result Date: 06/16/2020 CLINICAL DATA:  Low back pain, progressive neurological deficit.  EXAM: MRI LUMBAR SPINE WITHOUT CONTRAST TECHNIQUE: Multiplanar, multisequence MR imaging of the lumbar spine was performed. No intravenous contrast was administered. COMPARISON:  MRI of the lumbar spine June 30, 2015. FINDINGS: Segmentation: A transitional lumbosacral vertebra is assumed to represent the S1 level. Careful correlation with this numbering strategy prior to any procedural intervention would be recommended. Alignment: Levoconvex scoliosis of the lumbar spine. Small anterolisthesis of L2 over L3 and L3 over L4. Small retrolisthesis of L5 over S1. Vertebrae: Compression fracture of the T12 vertebral body without marrow edema consistent with chronic process. Small superimposed Schmorl node. No acute fracture, evidence of discitis or aggressive bone lesion. Conus medullaris and cauda equina: Conus extends to the L1 level. Conus and cauda equina appear normal. Paraspinal and other soft tissues: Negative. Disc levels: T12-L1: No spinal canal or neural foraminal stenosis. L1-2: Disc bulge resulting in mild spinal canal stenosis mild left neural foraminal narrowing. L2-3: Loss of disc height, disc bulge with associated osteophytic component, facet degenerative changes and ligamentum flavum redundancy. Findings result in mild spinal canal stenosis with narrowing of the bilateral subarticular zones and mild right neural foraminal narrowing. L3-4: Disc bulge, hypertrophic facet degenerative changes and ligamentum flavum redundancy resulting in mild spinal canal stenosis. No significant neural foraminal narrowing. L4-5: Loss of disc height, disc bulge with associated osteophytic component, facet degenerative changes ligamentum flavum redundancy resulting in moderate spinal canal stenosis, moderate right and mild left neural foraminal narrowing. No significant change from prior MRI. L5-S1: Loss of disc height, disc bulge with associated osteophytic component and the superimposed central disc protrusion, facet  degenerative changes and ligamentum flavum redundancy. Findings result in narrowing of the bilateral subarticular zones, and moderate bilateral neural foraminal narrowing. No significant change from prior. S1-2: Facet degenerative changes. No spinal canal or neural foraminal stenosis. IMPRESSION: 1. Transitional lumbosacral anatomy. 2. Degenerative changes of the lumbar  spine, not significantly changed from prior MRI. 3. Moderate spinal canal stenosis and moderate bilateral neural foraminal narrowing at L4-5. 4. Mild spinal canal stenosis at L2-3 and L3-4. 5. Moderate bilateral neural foraminal narrowing at L5-S1. Electronically Signed   By: Pedro Earls M.D.   On: 06/16/2020 17:17    Procedures Procedures   Medications Ordered in ED Medications  LORazepam (ATIVAN) injection 0.5 mg (0.5 mg Intravenous Given 06/16/20 1613)  HYDROmorphone (DILAUDID) injection 0.5 mg (0.5 mg Intravenous Given 06/16/20 1613)    ED Course  I have reviewed the triage vital signs and the nursing notes.  Pertinent labs & imaging results that were available during my care of the patient were reviewed by me and considered in my medical decision making (see chart for details).    MDM Rules/Calculators/A&P                          Patient presented to the ED for evaluation of leg pain.  Patient recently had x-rays and was told she had mild hip arthritis.  Patient's symptoms sound more radicular in nature.  No signs of infection on exam.  No signs of vascular compromise.  With the severity of her pain and the escalating nature I did order an MRI.  It does show signs of spinal stenosis and foraminal narrowing but no findings noted that would require any acute surgical intervention.  Patient did get relief with pain medicines administered in the ED.  She does have hydrocodone at home as well as Salonpas.  Recommend continuing that medication.  Consider outpatient follow-up with neurosurgery.  She may benefit  from further treatment such as spinal injections. Final Clinical Impression(s) / ED Diagnoses Final diagnoses:  Sciatica, unspecified laterality    Rx / DC Orders ED Discharge Orders    None       Dorie Rank, MD 06/16/20 1754

## 2020-06-16 NOTE — Telephone Encounter (Signed)
Patient noted in ER for pain.

## 2020-06-16 NOTE — ED Triage Notes (Signed)
Pt reports this past Friday she saw her PCP for c/o pain in r thigh.  Reports was sent to hospital for xray and was told she has some mild arthritis.  Pt says pain was better yesterday but more severe today.  Reports pain starts in r groin and radiates to r knee.  Denies injury.  Palpable pedal pulse.

## 2020-06-16 NOTE — Discharge Instructions (Signed)
Continue your hydrocodone and lidocaine patches.  Follow-up with your spine doctor for further evaluation.

## 2020-06-16 NOTE — Telephone Encounter (Signed)
Received call from patient.   Reports that pain in hip continues. States that she has x2 doses of prednisone left.   Inquired if referral to ortho is appropriate.   Please advise.

## 2020-06-17 ENCOUNTER — Telehealth: Payer: Self-pay | Admitting: Family Medicine

## 2020-06-17 MED ORDER — HYDROCODONE-ACETAMINOPHEN 5-325 MG PO TABS: 1.0000 | ORAL_TABLET | Freq: Four times a day (QID) | ORAL | 0 refills | Status: DC | PRN

## 2020-06-17 NOTE — Addendum Note (Signed)
Addended by: Sheral Flow on: 06/17/2020 10:02 AM   Modules accepted: Orders

## 2020-06-17 NOTE — Telephone Encounter (Signed)
Patient discharged from ED. Recommendations from ED are as follows: Consider outpatient follow-up with neurosurgery.  She may benefit from further treatment such as spinal injections.  Please advise.

## 2020-06-17 NOTE — Telephone Encounter (Signed)
Pt called in stating that she spoke with the referral coordinator and says that they have made her an appt for the 6/1, this was the earliest they could get her in. Pt is asking for some pain meds to be sent to her pharmacy. Please advise  Cb#: [phone]

## 2020-06-17 NOTE — Telephone Encounter (Signed)
I am fine with referral to orthopedics.

## 2020-06-17 NOTE — Telephone Encounter (Signed)
Received call from patient.   Patient states that she is in severe pain. Discussed with PCP and recommendations are as follows: Continue with referral for ortho.  Referral orders placed.

## 2020-06-17 NOTE — Telephone Encounter (Signed)
I refilled it

## 2020-06-17 NOTE — Telephone Encounter (Signed)
Ok to refill??  Last office visit 06/11/2020.  Last refill 06/08/2020.

## 2020-06-19 DIAGNOSIS — J449 Chronic obstructive pulmonary disease, unspecified: Secondary | ICD-10-CM | POA: Diagnosis not present

## 2020-06-23 ENCOUNTER — Encounter: Payer: Self-pay | Admitting: Physician Assistant

## 2020-06-23 ENCOUNTER — Ambulatory Visit (INDEPENDENT_AMBULATORY_CARE_PROVIDER_SITE_OTHER): Payer: Medicare HMO | Admitting: Physician Assistant

## 2020-06-23 ENCOUNTER — Ambulatory Visit: Payer: Self-pay

## 2020-06-23 ENCOUNTER — Other Ambulatory Visit: Payer: Self-pay

## 2020-06-23 DIAGNOSIS — M25551 Pain in right hip: Secondary | ICD-10-CM

## 2020-06-23 MED ORDER — METHYLPREDNISOLONE ACETATE 40 MG/ML IJ SUSP
40.0000 mg | INTRAMUSCULAR | Status: AC | PRN
  Administered 2020-06-23: 40 mg via INTRA_ARTICULAR

## 2020-06-23 MED ORDER — LIDOCAINE HCL 1 % IJ SOLN
3.0000 mL | INTRAMUSCULAR | Status: AC | PRN
  Administered 2020-06-23: 3 mL

## 2020-06-23 NOTE — Progress Notes (Signed)
Office Visit Note   Patient: Mary Blevins           Date of Birth: 1945-12-05           MRN: [id number] Visit Date: 06/23/2020              Requested by: [name], MD [id number] [place],  [place] [address] PCP: [name], MD   Assessment & Plan: Visit Diagnoses:  1. Pain in right hip     Plan: We will send her for an intra-articular injection of the left hip.  I did discuss with her that I felt she had hip bursitis and also moderate hip arthritis.  Would like to see her back in 2 weeks to see what type of response she had to these injections.  Questions encouraged and answered at length.  Follow-Up Instructions: Return in about 2 weeks (around 07/07/2020).   Orders:  Orders Placed This Encounter  Procedures  . Large Joint Inj  . US Guided Needle Placement - No Linked Charges   No orders of the defined types were placed in this encounter.     Procedures: Large Joint Inj: R greater trochanter on 06/23/2020 3:47 PM Indications: pain Details: 22 G 1.5 in needle, lateral approach  Arthrogram: No  Medications: 3 mL lidocaine 1 %; 40 mg methylPREDNISolone acetate 40 MG/ML Outcome: tolerated well, no immediate complications Procedure, treatment alternatives, risks and benefits explained, specific risks discussed. Consent was given by the patient. Immediately prior to procedure a time out was called to verify the correct patient, procedure, equipment, support staff and site/side marked as required. Patient was prepped and draped in the usual sterile fashion.       Clinical Data: No additional findings.   Subjective: Chief Complaint  Patient presents with  . Right Hip - Pain    HPI Mary Blevins is a pleasant 75 year old female comes in today due to severe right hip pain.  She is seen in the ER on 06/16/2020 due to the right hip pain.  She saw her primary care doctor due to the pain and had x-rays and was told she has mild arthritis of her  hip.  She is taking hydrocodone for the pain and also was on the Dosepak without any real relief.  States pains been ongoing since May 18.  No known injury.  Points to groin and lateral aspect of the right hip as source of her pain.  No numbness tingling down the leg. MRI lumbar spine dated 06/16/2020 is reviewed and shows mild spinal canal stenosis and mild right neural foraminal stenosis at L2-3, mild spinal canal stenosis at L3-4 L4-5 moderate spinal canal stenosis and moderate right and mild left neural foraminal narrowing at L4-5 L5-S1 moderate bilateral neuroforaminal stenosis. AP pelvis lateral view right hip dated 06/11/2020 are reviewed and shows moderate arthritis of the right hip.  No acute fractures.  No bony abnormalities otherwise.  Hips well located.  Review of Systems  Constitutional: Negative for chills and fever.     Objective: Vital Signs: There were no vitals taken for this visit.  Physical Exam Constitutional:      Appearance: She is not ill-appearing or diaphoretic.  Pulmonary:     Effort: Pulmonary effort is normal.  Neurological:     Mental Status: She is alert and oriented to person, place, and time.  Psychiatric:        Mood and Affect: Mood normal.  Ortho Exam Lower extremity strength testing 5 out of 5 bilaterally negative straight leg raise bilaterally.  Tenderness over the right hip trochanteric region.  Left hip good range of motion without pain.  Right hip initial evaluation guarded range of motion with attempts of external and internal rotation.  These motions cause severe pain.  Post injection trochanteric she has fluid range of motion of the right hip with some discomfort with internal rotation. Specialty Comments:  No specialty comments available.  Imaging: No results found.   PMFS History: Patient Active Problem List   Diagnosis Date Noted  . Leg cramping 01/07/2020  . GAD (generalized anxiety disorder) 12/20/2016  . Chronic insomnia  12/20/2016  . Aortic atherosclerosis ([place]) 06/09/2016  . Benign neoplasm of ascending colon   . Benign neoplasm of transverse colon   . Benign neoplasm of sigmoid colon   . Diverticulosis of sigmoid colon   . Grade I internal hemorrhoids   . PSVT (paroxysmal supraventricular tachycardia) ([place]) 02/17/2015  . Back pain 07/22/2012  . Acute pain of left hip 07/22/2012  . Stricture and stenosis of esophagus 09/18/2011  . Hx of colonic polyps 09/18/2011  . COPD (chronic obstructive pulmonary disease) ([place]) 07/14/2010  . Generalized headaches 07/14/2010  . Osteopenia 07/14/2010  . Hypothyroid 07/14/2010  . Elevated lipids 07/14/2010   Past Medical History:  Diagnosis Date  . Arthritis   . COPD (chronic obstructive pulmonary disease) ([place])   . Elevated lipids   . Generalized headaches   . History of pneumonia   . Hyperlipidemia   . Hypothyroidism   . MVP (mitral valve prolapse)    palpitations  . Osteopenia   . Seasonal allergies   . Sinusitis   . Tubular adenoma of colon     Family History  Problem Relation Age of Onset  . Breast cancer Maternal Aunt   . Heart attack Maternal Grandmother   . Heart attack Mother   . Heart disease Mother   . Dementia Mother   . Diabetes Brother   . Prostate cancer Maternal Grandfather   . Healthy Father   . Healthy Sister   . Heart attack Other        NIECE  . Colon cancer Neg Hx   . Esophageal cancer Neg Hx   . Rectal cancer Neg Hx   . Stomach cancer Neg Hx     Past Surgical History:  Procedure Laterality Date  . COLONOSCOPY WITH PROPOFOL N/A 01/06/2016   Procedure: COLONOSCOPY WITH PROPOFOL;  Surgeon: Doran Stabler, MD;  Location: [facility];  Service: Gastroenterology;  Laterality: N/A;  . DILATION AND CURETTAGE OF UTERUS    . THYROIDECTOMY    . TONSILLECTOMY     Social History   Occupational History    Employer: [employer]  Tobacco Use  . Smoking status: Former Smoker    Quit date: 07/24/2005    Years  since quitting: 14.9  . Smokeless tobacco: Never Used  Vaping Use  . Vaping Use: Never used  Substance and Sexual Activity  . Alcohol use: Yes    Comment: rare  . Drug use: No  . Sexual activity: Not on file

## 2020-06-23 NOTE — Progress Notes (Signed)
Subjective: Patient is here for ultrasound-guided intra-articular right hip injection.   Pain for a couple weeks.   Objective:  Pain with IR.  Procedure: Ultrasound guided injection is preferred based studies that show increased duration, increased effect, greater accuracy, decreased procedural pain, increased response rate, and decreased cost with ultrasound guided versus blind injection.   Verbal informed consent obtained.  Time-out conducted.  Noted no overlying erythema, induration, or other signs of local infection. Ultrasound-guided right hip injection: After sterile prep with Betadine, injected 4 cc 0.25% bupivacaine without epinephrine and 6 mg betamethasone using a 22-gauge spinal needle, passing the needle through the iliofemoral ligament into the femoral head/neck junction.  Injectate seen filling joint capsule.

## 2020-06-24 ENCOUNTER — Telehealth: Payer: Self-pay | Admitting: Family Medicine

## 2020-06-24 NOTE — Telephone Encounter (Signed)
Received voicemail from patient to request that provider read her chart. Patient had an appointment with [facility] and received 2 shots; patient stated 2nd shot was very painful. Please advise at [phone], or [phone].

## 2020-06-24 NOTE — Telephone Encounter (Signed)
Call placed to patient to inquire.   Reports that she wanted to make sure provider was aware of treatment plan.

## 2020-06-28 ENCOUNTER — Telehealth: Payer: Self-pay | Admitting: Family Medicine

## 2020-06-28 NOTE — Telephone Encounter (Signed)
Patient called to report swelling in right ankle possibly caused by recent injection. Patient seeking advice on how to make swelling go down. Please advise at [phone].

## 2020-06-29 NOTE — Telephone Encounter (Signed)
Call placed to patient.   States that she has swelling in her R ankle and is concerned that injection has caused her to retain fluid. Denies pain in ankle.   Advised to use RICE method. If ankle has not improved, schedule OV for evaluation.   Verbalized understanding.

## 2020-06-30 ENCOUNTER — Encounter: Payer: Self-pay | Admitting: Pulmonary Disease

## 2020-06-30 ENCOUNTER — Ambulatory Visit: Payer: Medicare HMO | Admitting: Pulmonary Disease

## 2020-06-30 ENCOUNTER — Other Ambulatory Visit: Payer: Self-pay

## 2020-06-30 VITALS — BP 134/70 | HR 73 | Temp 97.0°F | Ht 66.0 in | Wt 167.6 lb

## 2020-06-30 DIAGNOSIS — J432 Centrilobular emphysema: Secondary | ICD-10-CM | POA: Diagnosis not present

## 2020-06-30 MED ORDER — BREZTRI AEROSPHERE 160-9-4.8 MCG/ACT IN AERO: 2.0000 | INHALATION_SPRAY | Freq: Two times a day (BID) | RESPIRATORY_TRACT | 0 refills | Status: DC

## 2020-06-30 NOTE — Progress Notes (Signed)
[facility]  Chief Complaint  Patient presents with  . Follow-up    No respiratory complaints currently    Constitutional:  BP 134/70 (BP Location: Left Arm, Cuff Size: Normal)   Pulse 73   Temp (!) 97 F (36.1 C) (Temporal)   Ht 5\' 6"  (1.676 m)   Wt 167 lb 9.6 oz (76 kg)   SpO2 98% Comment: Room air  BMI 27.05 kg/m   Past Medical History:  HLD, HA, PNA, Hypothyroidism, Osteopenia, Allergies, Colon polyp, COVID 19 infection January 2021, Anxiety, Cataracts  Past Surgical History:  She  has a past surgical history that includes Thyroidectomy; Dilation and curettage of uterus; Tonsillectomy; and Colonoscopy with propofol (N/A, 01/06/2016).  Brief Summary:  Mary Blevins is a 75 y.o. female former smoker with COPD/emphysema.      Subjective:   Breathing has been okay.  Not having cough, or sputum.  Occasionally gets wheezing.  Not using albuterol much.    Had to get injection in her right hip.  She is walking with a cane.  Has soreness and swelling in her right ankle.    Physical Exam:   Appearance - well kempt   ENMT - no sinus tenderness, no oral exudate, no LAN, Mallampati 3 airway, no stridor  Respiratory - equal breath sounds bilaterally, no wheezing or rales  CV - s1s2 regular rate and rhythm, no murmurs  Ext - mild edema around lateral malleolus of right ankle  Skin - no rashes  Psych - normal mood and affect   Pulmonary testing:   PFT 03/01/17 >> FEV1 0.99 (51%), FEV1% 49, TLC 6.41 (121%), DLCO 51%, no BD  A1AT 06/02/19 >> 102, MM  Social History:  She  reports that she quit smoking about 14 years ago. She has never used smokeless tobacco. She reports current alcohol use. She reports that she does not use drugs.  Family History:  Her family history includes Breast cancer in her maternal aunt; Dementia in her mother; Diabetes in her brother; Healthy in her father and sister; Heart attack in her  maternal grandmother, mother, and another family member; Heart disease in her mother; Prostate cancer in her maternal grandfather.     Assessment/Plan:   COPD with emphysema. - tried trelegy but wasn't effective - will have her try breztri for two weeks; samples given - if this works okay and is covered by insurance, then she is to call office in two weeks for refill of breztri - otherwise she will resume spiriva and symbicort after breztri sample completed - continue prn albuterol - prn mucinex  Right ankle swelling. - suspect she has ankle sprain related to change in gait from hip pain  - f/u with primary care and orthopedics  Time Spent Involved in Patient Care on Day of Examination:  23 minutes  Follow up:  Patient Instructions  Try sample of breztri for two weeks.  Use two puffs in the morning and two puffs in the evening, and rinse your mouth after each use.  Don't use spiriva and symbicort while using breztri.  Check with your insurance plan formulary to see if breztri is covered.  If it is and you feel this works as well as spiriva and symbicort, then call the office in two weeks to get a refill of breztri.  Otherwise resume spiriva and symbicort.  Follow up in 1 year   Medication List:   Allergies as of 06/30/2020  Reactions   Meloxicam Other (See Comments)   BAD DREAMS, SAD THOUGHTS      Medication List       Accurate as of June 30, 2020 10:32 AM. If you have any questions, ask your nurse or doctor.        albuterol (2.5 MG/3ML) 0.083% nebulizer solution Commonly known as: PROVENTIL Take 3 mLs (2.5 mg total) by nebulization every 6 (six) hours as needed for wheezing or shortness of breath.   ALPRAZolam 0.5 MG tablet Commonly known as: Xanax Take 1 tablet (0.5 mg total) by mouth 3 (three) times daily as needed.   budesonide-formoterol 160-4.5 MCG/ACT inhaler Commonly known as: SYMBICORT Inhale 2 puffs into the lungs 2 (two) times daily.    diclofenac 75 MG EC tablet Commonly known as: VOLTAREN Take 1 tablet (75 mg total) by mouth 2 (two) times daily as needed (arthritis).   fluticasone 50 MCG/ACT nasal spray Commonly known as: FLONASE SPRAY 2 SPRAYS INTO EACH NOSTRIL EVERY DAY   HYDROcodone-acetaminophen 5-325 MG tablet Commonly known as: Norco Take 1 tablet by mouth every 6 (six) hours as needed for moderate pain.   hydrOXYzine 25 MG tablet Commonly known as: ATARAX/VISTARIL Take 1 tablet (25 mg total) by mouth 3 (three) times daily as needed for itching.   levothyroxine 50 MCG tablet Commonly known as: SYNTHROID TAKE 1 TABLET BY MOUTH DAILY BEFORE BREAKFAST   meclizine 25 MG tablet Commonly known as: ANTIVERT Take 1 tablet (25 mg total) by mouth 3 (three) times daily as needed for dizziness.   Propylene Glycol-Glycerin 0.6-0.6 % Soln Apply to eye.   Spiriva Respimat 2.5 MCG/ACT Aers Generic drug: Tiotropium Bromide Monohydrate Inhale 2 puffs into the lungs daily.   tiZANidine 4 MG tablet Commonly known as: ZANAFLEX TAKE 1 TABLET (4 MG TOTAL) BY MOUTH EVERY 6 (SIX) HOURS AS NEEDED FOR MUSCLE SPASMS.   verapamil 120 MG CR tablet Commonly known as: CALAN-SR Take 1 tablet (120 mg total) by mouth daily.       Signature:  Chesley Mires, MD [place] Pager - [phone] 06/30/2020, 10:32 AM

## 2020-06-30 NOTE — Patient Instructions (Signed)
Try sample of breztri for two weeks.  Use two puffs in the morning and two puffs in the evening, and rinse your mouth after each use.  Don't use spiriva and symbicort while using breztri.  Check with your insurance plan formulary to see if breztri is covered.  If it is and you feel this works as well as spiriva and symbicort, then call the office in two weeks to get a refill of breztri.  Otherwise resume spiriva and symbicort.  Follow up in 1 year

## 2020-07-01 ENCOUNTER — Telehealth: Payer: Self-pay | Admitting: Pulmonary Disease

## 2020-07-01 NOTE — Telephone Encounter (Signed)
Called and spoke to patient who called wanting to know if ok to take breating treatments with the [facility] inhaler. Patient is on Albuterol Neb PRN and usually takes it every morning. Patient states this morning she did the Breztri inhaler and then did the albuterol neb right after and would like to know if she should space them out. Patient states she's doing ok after the treatment and currently has no complaints, she would like Dr Juanetta Gosling advice.  Dr Halford Chessman please advise.

## 2020-07-01 NOTE — Telephone Encounter (Signed)
Called and spoke with patient and went over Dr Juanetta Gosling response/recommendations. Patient expressed full understanding. Nothing further needed at this time.  Chesley Mires, MD  You 45 minutes ago (1:31 PM)     Please let her know that she can use albuterol and breztri together in the morning if she feels like this helps open her breathing up more.

## 2020-07-05 ENCOUNTER — Telehealth: Payer: Self-pay

## 2020-07-05 NOTE — Telephone Encounter (Signed)
Patient called she is having numbness in her right leg/foot and her right hand patient stated she has been using ice packs and heating pads she is requesting a call back:[phone]

## 2020-07-07 ENCOUNTER — Ambulatory Visit (INDEPENDENT_AMBULATORY_CARE_PROVIDER_SITE_OTHER): Payer: Medicare HMO | Admitting: Physician Assistant

## 2020-07-07 ENCOUNTER — Other Ambulatory Visit: Payer: Self-pay

## 2020-07-07 ENCOUNTER — Encounter: Payer: Self-pay | Admitting: Physician Assistant

## 2020-07-07 DIAGNOSIS — M25551 Pain in right hip: Secondary | ICD-10-CM

## 2020-07-07 MED ORDER — HYDROCODONE-ACETAMINOPHEN 5-325 MG PO TABS: 1.0000 | ORAL_TABLET | Freq: Four times a day (QID) | ORAL | 0 refills | Status: DC | PRN

## 2020-07-07 NOTE — Progress Notes (Signed)
HPI: Mary Blevins returns today status post right hip trochanteric injection and intra-articular injection 06/23/2020.  Patient states she is about 20% better.  She is able to ambulate with a cane.  Still having significant groin pain.  She is taking hydrocodone at night.  She states ice does help.  No new injury.  Taking hydrocodone at night to sleep due to the pain in the right hip   Review of systems see HPI otherwise negative  Physical exam: Right hip has very limited and painful internal and external rotation of passively.  Tenderness over the right trochanteric region.  Excellent range of motion of the left hip without pain.  Impression: Right hip pain  Plan: Due to the fact the patient had limited relief with the intra-articular injection and the fact that she continues to have groin pain recommend MRI.  Again plain radiographs showed only mild arthritis involving the right hip.  MRI is to evaluate right hip cartilage.  We will have her back about 5 days after the MRI to go over results discuss further treatment.  Refill on hydrocodone was placed.

## 2020-07-07 NOTE — Addendum Note (Signed)
Addended by: Robyne Peers on: 07/07/2020 02:08 PM   Modules accepted: Orders

## 2020-07-15 ENCOUNTER — Other Ambulatory Visit: Payer: Self-pay | Admitting: Family Medicine

## 2020-07-15 ENCOUNTER — Ambulatory Visit (INDEPENDENT_AMBULATORY_CARE_PROVIDER_SITE_OTHER): Payer: Medicare HMO

## 2020-07-15 ENCOUNTER — Telehealth: Payer: Self-pay | Admitting: Family Medicine

## 2020-07-15 ENCOUNTER — Other Ambulatory Visit: Payer: Self-pay

## 2020-07-15 DIAGNOSIS — Z0000 Encounter for general adult medical examination without abnormal findings: Secondary | ICD-10-CM

## 2020-07-15 MED ORDER — HYDROCODONE BIT-HOMATROP MBR 5-1.5 MG/5ML PO SOLN: 5.0000 mL | Freq: Three times a day (TID) | ORAL | 0 refills | Status: DC | PRN

## 2020-07-15 NOTE — Telephone Encounter (Signed)
During medicare wellness visit, patient had c/o productive cough x 1 week with some head congestion. She denied any fevers, chills or SOB. She would like to know if you would call in Hycodan cough syrup. Please advise?

## 2020-07-15 NOTE — Progress Notes (Signed)
Subjective:   Mary Blevins is a 75 y.o. female who presents for Medicare Annual (Subsequent) preventive examination.  I connected with Mitchel Honour today by telephone and verified that I am speaking with the correct person using two identifiers. Location patient: home Location provider: work Persons participating in the virtual visit: patient, provider.   I discussed the limitations, risks, security and privacy concerns of performing an evaluation and management service by telephone and the availability of in person appointments. I also discussed with the patient that there may be a patient responsible charge related to this service. The patient expressed understanding and verbally consented to this telephonic visit.    Interactive audio and video telecommunications were attempted between this provider and patient, however failed, due to patient having technical difficulties OR patient did not have access to video capability.  We continued and completed visit with audio only.     Review of Systems    N/A  Cardiac Risk Factors include: advanced age (>84men, >78 women);hypertension     Objective:    Today's Vitals   There is no height or weight on file to calculate BMI.  Advanced Directives 07/15/2020 06/16/2020 01/06/2018 07/17/2016 04/25/2016 01/06/2016 01/03/2016  Does Patient Have a Medical Advance Directive? No No No No No No No  Would patient like information on creating a medical advance directive? No - Patient declined No - Patient declined - - No - Patient declined No - Patient declined No - Patient declined    Current Medications (verified) Outpatient Encounter Medications as of 07/15/2020  Medication Sig   albuterol (PROVENTIL) (2.5 MG/3ML) 0.083% nebulizer solution Take 3 mLs (2.5 mg total) by nebulization every 6 (six) hours as needed for wheezing or shortness of breath.   ALPRAZolam (XANAX) 0.5 MG tablet Take 1 tablet (0.5 mg total) by mouth 3 (three) times  daily as needed.   Budeson-Glycopyrrol-Formoterol (BREZTRI AEROSPHERE) 160-9-4.8 MCG/ACT AERO Inhale 2 puffs into the lungs in the morning and at bedtime.   fluticasone (FLONASE) 50 MCG/ACT nasal spray SPRAY 2 SPRAYS INTO EACH NOSTRIL EVERY DAY   HYDROcodone-acetaminophen (NORCO) 5-325 MG tablet Take 1 tablet by mouth every 6 (six) hours as needed for moderate pain.   hydrOXYzine (ATARAX/VISTARIL) 25 MG tablet Take 1 tablet (25 mg total) by mouth 3 (three) times daily as needed for itching.   levothyroxine (SYNTHROID) 50 MCG tablet TAKE 1 TABLET BY MOUTH DAILY BEFORE BREAKFAST   Propylene Glycol-Glycerin 0.6-0.6 % SOLN Apply to eye.   tiZANidine (ZANAFLEX) 4 MG tablet TAKE 1 TABLET (4 MG TOTAL) BY MOUTH EVERY 6 (SIX) HOURS AS NEEDED FOR MUSCLE SPASMS.   verapamil (CALAN-SR) 120 MG CR tablet Take 1 tablet (120 mg total) by mouth daily.   budesonide-formoterol (SYMBICORT) 160-4.5 MCG/ACT inhaler Inhale 2 puffs into the lungs 2 (two) times daily. (Patient not taking: Reported on 07/15/2020)   diclofenac (VOLTAREN) 75 MG EC tablet Take 1 tablet (75 mg total) by mouth 2 (two) times daily as needed (arthritis). (Patient not taking: Reported on 07/15/2020)   meclizine (ANTIVERT) 25 MG tablet Take 1 tablet (25 mg total) by mouth 3 (three) times daily as needed for dizziness. (Patient not taking: Reported on 07/15/2020)   Tiotropium Bromide Monohydrate (SPIRIVA RESPIMAT) 2.5 MCG/ACT AERS Inhale 2 puffs into the lungs daily. (Patient not taking: Reported on 07/15/2020)   No facility-administered encounter medications on file as of 07/15/2020.    Allergies (verified) Meloxicam   History: Past Medical History:  Diagnosis Date   Arthritis  COPD (chronic obstructive pulmonary disease) (HCC)    Elevated lipids    Generalized headaches    History of pneumonia    Hyperlipidemia    Hypothyroidism    MVP (mitral valve prolapse)    palpitations   Osteopenia    Seasonal allergies    Sinusitis    Tubular  adenoma of colon    Past Surgical History:  Procedure Laterality Date   CATARACT EXTRACTION Left    COLONOSCOPY WITH PROPOFOL N/A 01/06/2016   Procedure: COLONOSCOPY WITH PROPOFOL;  Surgeon: Doran Stabler, MD;  Location: [facility];  Service: Gastroenterology;  Laterality: N/A;   DILATION AND CURETTAGE OF UTERUS     THYROIDECTOMY     TONSILLECTOMY     Family History  Problem Relation Age of Onset   Breast cancer Maternal Aunt    Heart attack Maternal Grandmother    Heart attack Mother    Heart disease Mother    Dementia Mother    Diabetes Brother    Prostate cancer Maternal Grandfather    Healthy Father    Healthy Sister    Heart attack Other        NIECE   Colon cancer Neg Hx    Esophageal cancer Neg Hx    Rectal cancer Neg Hx    Stomach cancer Neg Hx    Social History   Socioeconomic History   Marital status: Widowed    Spouse name: Not on file   Number of children: 0   Years of education: Not on file   Highest education level: Not on file  Occupational History    Employer: [employer]  Tobacco Use   Smoking status: Former    Pack years: 0.00    Types: Cigarettes    Quit date: 07/24/2005    Years since quitting: 14.9   Smokeless tobacco: Never  Vaping Use   Vaping Use: Never used  Substance and Sexual Activity   Alcohol use: Yes    Comment: rare   Drug use: No   Sexual activity: Not on file  Other Topics Concern   Not on file  Social History Narrative   Not on file   Social Determinants of Health   Financial Resource Strain: Low Risk    Difficulty of Paying Living Expenses: Not hard at all  Food Insecurity: No Food Insecurity   Worried About Charity fundraiser in the Last Year: Never true   [place] in the Last Year: Never true  Transportation Needs: No Transportation Needs   Lack of Transportation (Medical): No   Lack of Transportation (Non-Medical): No  Physical Activity: Inactive   Days of Exercise per Week: 0 days    Minutes of Exercise per Session: 0 min  Stress: No Stress Concern Present   Feeling of Stress : Only a little  Social Connections: Moderately Isolated   Frequency of Communication with Friends and Family: More than three times a week   Frequency of Social Gatherings with Friends and Family: More than three times a week   Attends Religious Services: More than 4 times per year   Active Member of [employer] or Organizations: No   Attends Archivist Meetings: Never   Marital Status: Widowed    Tobacco Counseling Counseling given: Not Answered   Clinical Intake:  Pre-visit preparation completed: Yes  Pain : No/denies pain     Nutritional Risks: None Diabetes: No  How often do you need to have someone help  you when you read instructions, pamphlets, or other written materials from your doctor or pharmacy?: 1 - Never  Diabetic?No      Information entered by :: [place] of Daily Living In your present state of health, do you have any difficulty performing the following activities: 07/15/2020  Hearing? N  Vision? N  Difficulty concentrating or making decisions? N  Walking or climbing stairs? Y  Dressing or bathing? N  Doing errands, shopping? N  Preparing Food and eating ? N  Using the Toilet? N  In the past six months, have you accidently leaked urine? N  Do you have problems with loss of bowel control? N  Managing your Medications? N  Managing your Finances? N  Housekeeping or managing your Housekeeping? N  Some recent data might be hidden    Patient Care Team: [name], MD as PCP - General (Family Medicine) Belva Crome, MD as PCP - Cardiology (Cardiology)  Indicate any recent Medical Services you may have received from other than [facility] providers in the past year (date may be approximate).     Assessment:   This is a routine wellness examination for [facility].  Hearing/Vision screen Vision Screening - Comments:: Patient states  gets eyes examined once per year. Currently wears glasses. Had left cataract extracted   Dietary issues and exercise activities discussed: Current Exercise Habits: The patient does not participate in regular exercise at present, Exercise limited by: orthopedic condition(s);respiratory conditions(s)   Goals Addressed             This Visit's Progress    Exercise 3x per week (30 min per time)       Awaiting hip pain to resolve so that [facility] may [facility]      Prevent falls         Depression Screen PHQ 2/9 Scores 07/15/2020 05/03/2020 03/10/2019 01/31/2018 01/24/2017 12/20/2016 09/05/2016  PHQ - 2 Score 2 0 0 0 0 0 0  PHQ- 9 Score 5 - - - 0 2 0    Fall Risk Fall Risk  07/15/2020 05/03/2020 03/10/2019 01/31/2018 01/24/2017  Falls in the past year? 0 0 0 0 No  Number falls in past yr: 0 - - - -  Injury with Fall? 0 - - - -  Risk for fall due to : Orthopedic patient Impaired balance/gait;Impaired mobility - - -  Follow up Falls evaluation completed;Falls prevention discussed Falls evaluation completed Falls evaluation completed Falls evaluation completed -    FALL RISK PREVENTION PERTAINING TO THE HOME:  Any stairs in or around the home? Yes  If so, are there any without handrails? No  Home free of loose throw rugs in walkways, pet beds, electrical cords, etc? Yes  Adequate lighting in your home to reduce risk of falls? Yes   ASSISTIVE DEVICES UTILIZED TO PREVENT FALLS:  Life alert? No  Use of a cane, walker or w/c? Yes  Grab bars in the bathroom? No  Shower chair or bench in shower? No  Elevated toilet seat or a handicapped toilet? No    Cognitive Function:  Normal cognitive status assessed by direct observation by this Nurse Health Advisor. No abnormalities found.        Immunizations Immunization History  Administered Date(s) Administered   Fluad Quad(high Dose 65+) 10/08/2018, 10/16/2019   Hepatitis B 12/06/1990, 01/03/1991, 07/07/1991   Influenza  Split 10/17/2013   Influenza Whole 10/23/2008   Influenza, High Dose Seasonal PF 09/26/2016,  10/12/2017   Influenza,inj,Quad PF,6+ Mos 10/29/2012, 10/28/2015   Influenza,inj,quad, With Preservative 10/30/2018   Influenza-Unspecified 10/05/2014   PFIZER(Purple Top)SARS-COV-2 Vaccination 06/09/2019, 06/30/2019   Pneumococcal Conjugate-13 03/23/2013   Pneumococcal Polysaccharide-23 12/21/2005, 07/25/2010   Td 03/13/1996   Tdap 08/12/2010   Zoster, Live 07/12/2010    TDAP status: Up to date  Flu Vaccine status: Up to date  Pneumococcal vaccine status: Up to date  Covid-19 vaccine status: Completed vaccines  Qualifies for Shingles Vaccine? Yes   Zostavax completed Yes   Shingrix Completed?: No.    Education has been provided regarding the importance of this vaccine. Patient has been advised to call insurance company to determine out of pocket expense if they have not yet received this vaccine. Advised may also receive vaccine at local pharmacy or Health Dept. Verbalized acceptance and understanding.  Screening Tests Health Maintenance  Topic Date Due   Zoster Vaccines- Shingrix (1 of 2) Never done   COVID-19 Vaccine (3 - Booster for Pfizer series) 11/30/2019   Hepatitis C Screening  01/06/2021 (Originally 01/30/1963)   TETANUS/TDAP  08/11/2020   INFLUENZA VACCINE  08/23/2020   COLONOSCOPY (Pts 45-53yrs Insurance coverage will need to be confirmed)  01/05/2021   MAMMOGRAM  01/08/2021   DEXA SCAN  Completed   PNA vac Low Risk Adult  Completed   HPV VACCINES  Aged Out    Health Maintenance  Health Maintenance Due  Topic Date Due   Zoster Vaccines- Shingrix (1 of 2) Never done   COVID-19 Vaccine (3 - Booster for Pfizer series) 11/30/2019    Colorectal cancer screening: Type of screening: Colonoscopy. Completed 01/06/2016. Repeat every 5 years  Mammogram status: Completed 01/09/2020. Repeat every year  Bone Density status: Completed 07/21/2016. Results reflect: Bone density  results: OSTEOPENIA. Repeat every 5 years.  Lung Cancer Screening: (Low Dose CT Chest recommended if Age 81-80 years, 30 pack-year currently smoking OR have quit w/in 15years.) does not qualify.   Lung Cancer Screening Referral: N/A   Additional Screening:  Hepatitis C Screening: does qualify;   Vision Screening: Recommended annual ophthalmology exams for early detection of glaucoma and other disorders of the eye. Is the patient up to date with their annual eye exam?  Yes  Who is the provider or what is the name of the office in which the patient attends annual eye exams? Dr. Andria Frames  If pt is not established with a provider, would they like to be referred to a provider to establish care? No .   Dental Screening: Recommended annual dental exams for proper oral hygiene  Community Resource Referral / Chronic Care Management: CRR required this visit?  No   CCM required this visit?  No      Plan:     I have personally reviewed and noted the following in the patient's chart:   Medical and social history Use of alcohol, tobacco or illicit drugs  Current medications and supplements including opioid prescriptions.  Functional ability and status Nutritional status Physical activity Advanced directives List of other physicians Hospitalizations, surgeries, and ER visits in previous 12 months Vitals Screenings to include cognitive, depression, and falls Referrals and appointments  In addition, I have reviewed and discussed with patient certain preventive protocols, quality metrics, and best practice recommendations. A written personalized care plan for preventive services as well as general preventive health recommendations were provided to patient.     Ofilia Neas, LPN   [id number]   Nurse Notes: None

## 2020-07-15 NOTE — Patient Instructions (Signed)
Ms. Gidley , Thank you for taking time to come for your Medicare Wellness Visit. I appreciate your ongoing commitment to your health goals. Please review the following plan we discussed and let me know if I can assist you in the future.   Screening recommendations/referrals: Colonoscopy: Up to date, next due 01/05/2021 Mammogram: Up to date next due 01/08/2021 Bone Density: Up to date, next due 07/21/2021 Recommended yearly ophthalmology/optometry visit for glaucoma screening and checkup Recommended yearly dental visit for hygiene and checkup  Vaccinations: Influenza vaccine: Up to date, next due fall 2022 Pneumococcal vaccine: Completed series  Tdap vaccine: Up to date, next due 08/11/2020 Shingles vaccine: Currently due for Shingrix, if you would like to receive we recommend that you do so at your local pharmacy    Advanced directives: Advance directive discussed with you today. Even though you declined this today please call our office should you change your mind and we can give you the proper paperwork for you to fill out.   Conditions/risks identified: None   Next appointment: none    Preventive Care 65 Years and Older, Female Preventive care refers to lifestyle choices and visits with your health care provider that can promote health and wellness. What does preventive care include? A yearly physical exam. This is also called an annual well check. Dental exams once or twice a year. Routine eye exams. Ask your health care provider how often you should have your eyes checked. Personal lifestyle choices, including: Daily care of your teeth and gums. Regular physical activity. Eating a healthy diet. Avoiding tobacco and drug use. Limiting alcohol use. Practicing safe sex. Taking low-dose aspirin every day. Taking vitamin and mineral supplements as recommended by your health care provider. What happens during an annual well check? The services and screenings done by your  health care provider during your annual well check will depend on your age, overall health, lifestyle risk factors, and family history of disease. Counseling  Your health care provider may ask you questions about your: Alcohol use. Tobacco use. Drug use. Emotional well-being. Home and relationship well-being. Sexual activity. Eating habits. History of falls. Memory and ability to understand (cognition). Work and work Statistician. Reproductive health. Screening  You may have the following tests or measurements: Height, weight, and BMI. Blood pressure. Lipid and cholesterol levels. These may be checked every 5 years, or more frequently if you are over 43 years old. Skin check. Lung cancer screening. You may have this screening every year starting at age 40 if you have a 30-pack-year history of smoking and currently smoke or have quit within the past 15 years. Fecal occult blood test (FOBT) of the stool. You may have this test every year starting at age 24. Flexible sigmoidoscopy or colonoscopy. You may have a sigmoidoscopy every 5 years or a colonoscopy every 10 years starting at age 32. Hepatitis C blood test. Hepatitis B blood test. Sexually transmitted disease (STD) testing. Diabetes screening. This is done by checking your blood sugar (glucose) after you have not eaten for a while (fasting). You may have this done every 1-3 years. Bone density scan. This is done to screen for osteoporosis. You may have this done starting at age 76. Mammogram. This may be done every 1-2 years. Talk to your health care provider about how often you should have regular mammograms. Talk with your health care provider about your test results, treatment options, and if necessary, the need for more tests. Vaccines  Your health care provider may recommend  certain vaccines, such as: Influenza vaccine. This is recommended every year. Tetanus, diphtheria, and acellular pertussis (Tdap, Td) vaccine. You may  need a Td booster every 10 years. Zoster vaccine. You may need this after age 55. Pneumococcal 13-valent conjugate (PCV13) vaccine. One dose is recommended after age 15. Pneumococcal polysaccharide (PPSV23) vaccine. One dose is recommended after age 36. Talk to your health care provider about which screenings and vaccines you need and how often you need them. This information is not intended to replace advice given to you by your health care provider. Make sure you discuss any questions you have with your health care provider. Document Released: 02/05/2015 Document Revised: 09/29/2015 Document Reviewed: 11/10/2014 Elsevier Interactive Patient Education  2017 [place] Prevention in the Home Falls can cause injuries. They can happen to people of all ages. There are many things you can do to make your home safe and to help prevent falls. What can I do on the outside of my home? Regularly fix the edges of walkways and driveways and fix any cracks. Remove anything that might make you trip as you walk through a door, such as a raised step or threshold. Trim any bushes or trees on the path to your home. Use bright outdoor lighting. Clear any walking paths of anything that might make someone trip, such as rocks or tools. Regularly check to see if handrails are loose or broken. Make sure that both sides of any steps have handrails. Any raised decks and porches should have guardrails on the edges. Have any leaves, snow, or ice cleared regularly. Use sand or salt on walking paths during winter. Clean up any spills in your garage right away. This includes oil or grease spills. What can I do in the bathroom? Use night lights. Install grab bars by the toilet and in the tub and shower. Do not use towel bars as grab bars. Use non-skid mats or decals in the tub or shower. If you need to sit down in the shower, use a plastic, non-slip stool. Keep the floor dry. Clean up any water that spills on  the floor as soon as it happens. Remove soap buildup in the tub or shower regularly. Attach bath mats securely with double-sided non-slip rug tape. Do not have throw rugs and other things on the floor that can make you trip. What can I do in the bedroom? Use night lights. Make sure that you have a light by your bed that is easy to reach. Do not use any sheets or blankets that are too big for your bed. They should not hang down onto the floor. Have a firm chair that has side arms. You can use this for support while you get dressed. Do not have throw rugs and other things on the floor that can make you trip. What can I do in the kitchen? Clean up any spills right away. Avoid walking on wet floors. Keep items that you use a lot in easy-to-reach places. If you need to reach something above you, use a strong step stool that has a grab bar. Keep electrical cords out of the way. Do not use floor polish or wax that makes floors slippery. If you must use wax, use non-skid floor wax. Do not have throw rugs and other things on the floor that can make you trip. What can I do with my stairs? Do not leave any items on the stairs. Make sure that there are handrails on both sides of the  stairs and use them. Fix handrails that are broken or loose. Make sure that handrails are as long as the stairways. Check any carpeting to make sure that it is firmly attached to the stairs. Fix any carpet that is loose or worn. Avoid having throw rugs at the top or bottom of the stairs. If you do have throw rugs, attach them to the floor with carpet tape. Make sure that you have a light switch at the top of the stairs and the bottom of the stairs. If you do not have them, ask someone to add them for you. What else can I do to help prevent falls? Wear shoes that: Do not have high heels. Have rubber bottoms. Are comfortable and fit you well. Are closed at the toe. Do not wear sandals. If you use a stepladder: Make sure  that it is fully opened. Do not climb a closed stepladder. Make sure that both sides of the stepladder are locked into place. Ask someone to hold it for you, if possible. Clearly mark and make sure that you can see: Any grab bars or handrails. First and last steps. Where the edge of each step is. Use tools that help you move around (mobility aids) if they are needed. These include: Canes. Walkers. Scooters. Crutches. Turn on the lights when you go into a dark area. Replace any light bulbs as soon as they burn out. Set up your furniture so you have a clear path. Avoid moving your furniture around. If any of your floors are uneven, fix them. If there are any pets around you, be aware of where they are. Review your medicines with your doctor. Some medicines can make you feel dizzy. This can increase your chance of falling. Ask your doctor what other things that you can do to help prevent falls. This information is not intended to replace advice given to you by your health care provider. Make sure you discuss any questions you have with your health care provider. Document Released: 11/05/2008 Document Revised: 06/17/2015 Document Reviewed: 02/13/2014 Elsevier Interactive Patient Education  2017 [employer].

## 2020-07-20 DIAGNOSIS — J449 Chronic obstructive pulmonary disease, unspecified: Secondary | ICD-10-CM | POA: Diagnosis not present

## 2020-07-21 ENCOUNTER — Telehealth: Payer: Self-pay | Admitting: *Deleted

## 2020-07-21 NOTE — Telephone Encounter (Signed)
Received call from patient.   Reports that orthopedics has ordered MRI to further evaluate hip pain. MRI is scheduled for 07/29/2020.  Reports that she has increased anxiety and is requesting medication to help calm her for MRI.   Please advise.

## 2020-07-23 ENCOUNTER — Other Ambulatory Visit: Payer: Self-pay | Admitting: Orthopaedic Surgery

## 2020-07-23 ENCOUNTER — Telehealth: Payer: Self-pay | Admitting: Physician Assistant

## 2020-07-23 MED ORDER — DIAZEPAM 5 MG PO TABS: 5.0000 mg | ORAL_TABLET | Freq: Once | ORAL | 0 refills | Status: AC

## 2020-07-23 NOTE — Telephone Encounter (Signed)
Please see below and advise.

## 2020-07-23 NOTE — Telephone Encounter (Signed)
I called and advised pt that she will need a driver for her appt and she may take 30 min prior.

## 2020-07-23 NOTE — Telephone Encounter (Signed)
Patient called. She will have a MRI on 7/7. Would like something to calm her nerves before going in. Her call back number is [phone]

## 2020-07-27 ENCOUNTER — Other Ambulatory Visit: Payer: Self-pay | Admitting: Family Medicine

## 2020-07-27 MED ORDER — DIAZEPAM 5 MG PO TABS: 5.0000 mg | ORAL_TABLET | Freq: Two times a day (BID) | ORAL | 0 refills | Status: DC | PRN

## 2020-07-27 NOTE — Telephone Encounter (Signed)
Call placed to patient and patient made aware.  

## 2020-07-29 ENCOUNTER — Ambulatory Visit
Admission: RE | Admit: 2020-07-29 | Discharge: 2020-07-29 | Disposition: A | Discharge status: Home / Self Care | Payer: Medicare HMO | Source: Ambulatory Visit | Attending: Physician Assistant | Admitting: Physician Assistant

## 2020-07-29 ENCOUNTER — Other Ambulatory Visit: Payer: Self-pay

## 2020-07-29 ENCOUNTER — Telehealth: Payer: Self-pay | Admitting: Physician Assistant

## 2020-07-29 ENCOUNTER — Other Ambulatory Visit: Payer: Self-pay | Admitting: Orthopaedic Surgery

## 2020-07-29 DIAGNOSIS — M1611 Unilateral primary osteoarthritis, right hip: Secondary | ICD-10-CM | POA: Diagnosis not present

## 2020-07-29 DIAGNOSIS — M25551 Pain in right hip: Secondary | ICD-10-CM

## 2020-07-29 MED ORDER — HYDROCODONE-ACETAMINOPHEN 5-325 MG PO TABS: 1.0000 | ORAL_TABLET | Freq: Four times a day (QID) | ORAL | 0 refills | Status: DC | PRN

## 2020-07-29 NOTE — Telephone Encounter (Signed)
Please advise 

## 2020-07-29 NOTE — Telephone Encounter (Signed)
Patient called asking for a refill of hydrocodone. Patient is asking for 10 mg instead of 5mg . The 5mg  is not strong enough. Please send to pharmacy on file. Pt phone number is [phone].

## 2020-07-30 NOTE — Telephone Encounter (Signed)
I called pt she stated understanding. Pt stated she had her MRI done and wanted to know if she can know the results prior to her appt with you on 7/14. Please advise

## 2020-07-30 NOTE — Telephone Encounter (Signed)
Patient aware of the below message  

## 2020-08-04 ENCOUNTER — Ambulatory Visit (INDEPENDENT_AMBULATORY_CARE_PROVIDER_SITE_OTHER): Payer: Medicare HMO | Admitting: Nurse Practitioner

## 2020-08-04 ENCOUNTER — Telehealth: Payer: Self-pay | Admitting: Family Medicine

## 2020-08-04 ENCOUNTER — Encounter: Payer: Self-pay | Admitting: Nurse Practitioner

## 2020-08-04 ENCOUNTER — Other Ambulatory Visit: Payer: Self-pay | Admitting: Interventional Cardiology

## 2020-08-04 ENCOUNTER — Other Ambulatory Visit: Payer: Self-pay

## 2020-08-04 ENCOUNTER — Telehealth: Payer: Self-pay

## 2020-08-04 VITALS — BP 140/78 | HR 78 | Temp 98.0°F | Ht 66.0 in | Wt 167.4 lb

## 2020-08-04 DIAGNOSIS — E039 Hypothyroidism, unspecified: Secondary | ICD-10-CM

## 2020-08-04 DIAGNOSIS — R200 Anesthesia of skin: Secondary | ICD-10-CM

## 2020-08-04 DIAGNOSIS — I10 Essential (primary) hypertension: Secondary | ICD-10-CM | POA: Diagnosis not present

## 2020-08-04 DIAGNOSIS — R7309 Other abnormal glucose: Secondary | ICD-10-CM | POA: Diagnosis not present

## 2020-08-04 DIAGNOSIS — E785 Hyperlipidemia, unspecified: Secondary | ICD-10-CM | POA: Diagnosis not present

## 2020-08-04 NOTE — Telephone Encounter (Signed)
Called pt to reschedule colonoscopy for August with available providers (Dr. Tarri Glenn and Dr. Candis Schatz). Unable to reach d/t no answer.

## 2020-08-04 NOTE — Progress Notes (Addendum)
Subjective:    Patient ID: Mary Blevins, female    DOB: [birth year], 75 y.o.   MRN: [id number]  HPI: Mary Blevins is a 75 y.o. female presenting for right hand humbness.    Chief Complaint  Patient presents with   Numbness    Right hand, 2 fingers for the past 2 wks   Goes back to orthopedic tomorrow for follow up of hip pain.  NUMBNESS Duration: weeks Onset: constant Location: right hand 4th and 5th finger Bilateral: no Symmetric: no Decreased sensation: no  Weakness: no Pain: yes Quality: tingling  Severity: 8/10  Frequency: constant Trauma: no Recent illness: no Diabetes: no Thyroid disease: no  HIV: no  Alcoholism: no  Spinal cord injury: no Alleviating factors: nothing Aggravating factors: nothing Status: stable Treatments attempted: nothing  She is due for lab work - wondering if it can be checked today..    Allergies  Allergen Reactions   Meloxicam Other (See Comments)    BAD DREAMS, SAD THOUGHTS    Outpatient Encounter Medications as of 08/04/2020  Medication Sig Note   albuterol (PROVENTIL) (2.5 MG/3ML) 0.083% nebulizer solution Take 3 mLs (2.5 mg total) by nebulization every 6 (six) hours as needed for wheezing or shortness of breath.    ALPRAZolam (XANAX) 0.5 MG tablet Take 1 tablet (0.5 mg total) by mouth 3 (three) times daily as needed. 01/07/2020: ONLY AS NEEDED   Budeson-Glycopyrrol-Formoterol (BREZTRI AEROSPHERE) 160-9-4.8 MCG/ACT AERO Inhale 2 puffs into the lungs in the morning and at bedtime.    budesonide-formoterol (SYMBICORT) 160-4.5 MCG/ACT inhaler Inhale 2 puffs into the lungs 2 (two) times daily.    diclofenac (VOLTAREN) 75 MG EC tablet Take 1 tablet (75 mg total) by mouth 2 (two) times daily as needed (arthritis).    fluticasone (FLONASE) 50 MCG/ACT nasal spray SPRAY 2 SPRAYS INTO EACH NOSTRIL EVERY DAY    HYDROcodone bit-homatropine (HYCODAN) 5-1.5 MG/5ML syrup Take 5 mLs by mouth every 8 (eight) hours as needed for  cough.    HYDROcodone-acetaminophen (NORCO) 5-325 MG tablet Take 1 tablet by mouth every 6 (six) hours as needed for moderate pain.    hydrOXYzine (ATARAX/VISTARIL) 25 MG tablet Take 1 tablet (25 mg total) by mouth 3 (three) times daily as needed for itching.    levothyroxine (SYNTHROID) 50 MCG tablet TAKE 1 TABLET BY MOUTH DAILY BEFORE BREAKFAST    meclizine (ANTIVERT) 25 MG tablet Take 1 tablet (25 mg total) by mouth 3 (three) times daily as needed for dizziness. 08/04/2020: PRN   Propylene Glycol-Glycerin 0.6-0.6 % SOLN Apply to eye.    Tiotropium Bromide Monohydrate (SPIRIVA RESPIMAT) 2.5 MCG/ACT AERS Inhale 2 puffs into the lungs daily.    tiZANidine (ZANAFLEX) 4 MG tablet TAKE 1 TABLET (4 MG TOTAL) BY MOUTH EVERY 6 (SIX) HOURS AS NEEDED FOR MUSCLE SPASMS.    verapamil (CALAN-SR) 120 MG CR tablet Take 1 tablet (120 mg total) by mouth daily.    [DISCONTINUED] diazepam (VALIUM) 5 MG tablet Take 1 tablet (5 mg total) by mouth every 12 (twelve) hours as needed for anxiety (take 60 minutes before mri.).    [DISCONTINUED] rosuvastatin (CRESTOR) 5 MG tablet Take 5 mg by mouth daily.    No facility-administered encounter medications on file as of 08/04/2020.    Patient Active Problem List   Diagnosis Date Noted   Leg cramping 01/07/2020   GAD (generalized anxiety disorder) 12/20/2016   Chronic insomnia 12/20/2016   Aortic atherosclerosis ([place]) 06/09/2016   Benign neoplasm  of ascending colon    Benign neoplasm of transverse colon    Benign neoplasm of sigmoid colon    Diverticulosis of sigmoid colon    Grade I internal hemorrhoids    PSVT (paroxysmal supraventricular tachycardia) ([place]) 02/17/2015   Back pain 07/22/2012   Acute pain of left hip 07/22/2012   Stricture and stenosis of esophagus 09/18/2011   Hx of colonic polyps 09/18/2011   COPD (chronic obstructive pulmonary disease) ([place]) 07/14/2010   Generalized headaches 07/14/2010   Osteopenia 07/14/2010   Hypothyroid 07/14/2010    Elevated lipids 07/14/2010    Past Medical History:  Diagnosis Date   Arthritis    COPD (chronic obstructive pulmonary disease) ([facility])    Elevated lipids    Generalized headaches    History of pneumonia    Hyperlipidemia    Hypothyroidism    MVP (mitral valve prolapse)    palpitations   Osteopenia    Seasonal allergies    Sinusitis    Tubular adenoma of colon     Relevant past medical, surgical, family and social history reviewed and updated as indicated. Interim medical history since our last visit reviewed.  Review of Systems Per HPI unless specifically indicated above     Objective:    BP 140/78   Pulse 78   Temp 98 F (36.7 C)   Ht 5\' 6"  (1.676 m)   Wt 167 lb 6.4 oz (75.9 kg)   SpO2 95%   BMI 27.02 kg/m   Wt Readings from Last 3 Encounters:  08/04/20 167 lb 6.4 oz (75.9 kg)  06/30/20 167 lb 9.6 oz (76 kg)  06/16/20 170 lb (77.1 kg)    Physical Exam Vitals and nursing note reviewed.  Constitutional:      General: She is not in acute distress.    Appearance: Normal appearance. She is not toxic-appearing.  HENT:     Head: Normocephalic and atraumatic.     Right Ear: External ear normal.     Left Ear: External ear normal.  Eyes:     General: No scleral icterus.       Right eye: No discharge.        Left eye: No discharge.     Extraocular Movements: Extraocular movements intact.     Pupils: Pupils are equal, round, and reactive to light.  Cardiovascular:     Rate and Rhythm: Normal rate and regular rhythm.     Heart sounds: Normal heart sounds. No murmur heard. Pulmonary:     Effort: Pulmonary effort is normal. No respiratory distress.     Breath sounds: Normal breath sounds. No wheezing, rhonchi or rales.  Musculoskeletal:        General: Normal range of motion.     Right hand: No swelling, tenderness or bony tenderness. Normal range of motion. Normal strength. Normal sensation. Normal capillary refill. Normal pulse.     Left hand: No swelling,  tenderness or bony tenderness. Normal strength. Normal sensation. Normal capillary refill. Normal pulse.     Cervical back: Normal range of motion.     Right lower leg: No edema.     Left lower leg: No edema.  Lymphadenopathy:     Cervical: No cervical adenopathy.  Skin:    General: Skin is warm and dry.     Capillary Refill: Capillary refill takes less than 2 seconds.     Coloration: Skin is not jaundiced or pale.     Findings: No erythema.  Neurological:     General:  No focal deficit present.     Mental Status: She is alert and oriented to person, place, and time. Mental status is at baseline.     Cranial Nerves: No cranial nerve deficit or dysarthria.     Sensory: Sensation is intact. No sensory deficit.     Motor: Motor function is intact. No weakness or abnormal muscle tone.     Coordination: Coordination normal.     Gait: Gait normal.  Psychiatric:        Mood and Affect: Mood normal.        Behavior: Behavior normal.        Thought Content: Thought content normal.        Judgment: Judgment normal.      Assessment & Plan:  1. Essential hypertension BP acceptable today in office. Will check blood counts and electrolytes, kidney function.  Continue verapamil 120 mg daily.   - CBC with Differential - COMPLETE METABOLIC PANEL WITH GFR  2. Elevated lipids Chronic.  Will check lipids today, however patient is not fasting.  Continue Crestor 5 mg daily for now.   - CBC with Differential - Lipid panel  3. Hypothyroidism, unspecified type Will check TSH today, due for recheck.  Continue levothyroxine 50 mcg daily for now.    - CBC with Differential - TSH  4. Finger numbness Acute, unclear cause.  No history of diabetes, alcoholism.  Will check A1c, B12 level, kidney function with electrolytes.  No neurological deficit today.  Question radicular cause with pain shooting up arm.  Will start prednisone taper.  Consider nerve testing if everything negative and no benefit with  prednisone.   - Hemoglobin A1c - Vitamin B12 - COMPLETE METABOLIC PANEL WITH GFR    Follow up plan: Return for pending lab work.

## 2020-08-04 NOTE — Telephone Encounter (Signed)
Patient called in states that at her visit today Mary Blevins mentioned calling in prednisone for her. She wasn't sure if it was called in or not. She uses [facility] on [address]. In [place].   CB# [phone]

## 2020-08-05 ENCOUNTER — Ambulatory Visit (INDEPENDENT_AMBULATORY_CARE_PROVIDER_SITE_OTHER): Payer: Medicare HMO | Admitting: Physician Assistant

## 2020-08-05 ENCOUNTER — Encounter: Payer: Self-pay | Admitting: *Deleted

## 2020-08-05 ENCOUNTER — Encounter: Payer: Self-pay | Admitting: Physician Assistant

## 2020-08-05 DIAGNOSIS — M1611 Unilateral primary osteoarthritis, right hip: Secondary | ICD-10-CM | POA: Diagnosis not present

## 2020-08-05 LAB — LIPID PANEL
Cholesterol: 144 mg/dL (ref <200)
HDL: 49 mg/dL — ABNORMAL LOW (ref >=50)
LDL Cholesterol (Calc): 73 mg/dL (calc)
Non-HDL Cholesterol (Calc): 95 mg/dL (calc) (ref <130)
Total CHOL/HDL Ratio: 2.9 (calc) (ref <5.0)
Triglycerides: 136 mg/dL (ref <150)

## 2020-08-05 LAB — COMPLETE METABOLIC PANEL WITH GFR
AG Ratio: 1.6 (calc) (ref 1.0–2.5)
ALT: 12 U/L (ref 6–29)
AST: 16 U/L (ref 10–35)
Albumin: 4.1 g/dL (ref 3.6–5.1)
Alkaline phosphatase (APISO): 56 U/L (ref 37–153)
BUN: 9 mg/dL (ref 7–25)
CO2: 26 mmol/L (ref 20–32)
Calcium: 9.3 mg/dL (ref 8.6–10.4)
Chloride: 106 mmol/L (ref 98–110)
Creat: 0.78 mg/dL (ref 0.60–1.00)
Globulin: 2.5 g/dL (calc) (ref 1.9–3.7)
Glucose, Bld: 82 mg/dL (ref 65–99)
Potassium: 4.1 mmol/L (ref 3.5–5.3)
Sodium: 140 mmol/L (ref 135–146)
Total Bilirubin: 0.6 mg/dL (ref 0.2–1.2)
Total Protein: 6.6 g/dL (ref 6.1–8.1)
eGFR: 79 mL/min/{1.73_m2} (ref >=60)

## 2020-08-05 LAB — CBC WITH DIFFERENTIAL/PLATELET
Absolute Monocytes: 679 cells/uL (ref 200–950)
Basophils Absolute: 9 cells/uL (ref 0–200)
Basophils Relative: 0.1 %
Eosinophils Absolute: 103 cells/uL (ref 15–500)
Eosinophils Relative: 1.2 %
HCT: 41.3 % (ref 35.0–45.0)
Hemoglobin: 13.6 g/dL (ref 11.7–15.5)
Lymphs Abs: 2124 cells/uL (ref 850–3900)
MCH: 29.1 pg (ref 27.0–33.0)
MCHC: 32.9 g/dL (ref 32.0–36.0)
MCV: 88.2 fL (ref 80.0–100.0)
MPV: 10.7 fL (ref 7.5–12.5)
Monocytes Relative: 7.9 %
Neutro Abs: 5685 cells/uL (ref 1500–7800)
Neutrophils Relative %: 66.1 %
Platelets: 266 10*3/uL (ref 140–400)
RBC: 4.68 10*6/uL (ref 3.80–5.10)
RDW: 13.7 % (ref 11.0–15.0)
Total Lymphocyte: 24.7 %
WBC: 8.6 10*3/uL (ref 3.8–10.8)

## 2020-08-05 LAB — VITAMIN B12: Vitamin B-12: 403 pg/mL (ref 200–1100)

## 2020-08-05 LAB — HEMOGLOBIN A1C
Hgb A1c MFr Bld: 5.6 % of total Hgb (ref <5.7)
Mean Plasma Glucose: 114 mg/dL
eAG (mmol/L): 6.3 mmol/L

## 2020-08-05 LAB — TSH: TSH: 2.65 mIU/L (ref 0.40–4.50)

## 2020-08-05 MED ORDER — PREDNISONE 10 MG PO TABS: ORAL_TABLET | ORAL | 0 refills | Status: AC

## 2020-08-05 NOTE — Telephone Encounter (Signed)
Pt aware of medication sent to pharmacy.

## 2020-08-05 NOTE — Addendum Note (Signed)
Addended by: Noemi Chapel A on: 08/05/2020 01:31 PM   Modules accepted: Orders

## 2020-08-05 NOTE — Telephone Encounter (Signed)
Please notify patient - prednisone has been sent in.

## 2020-08-05 NOTE — Progress Notes (Addendum)
Office Visit Note   Patient: Mary Blevins           Date of Birth: Sep 01, 1945           MRN: [id number] Visit Date: 08/05/2020              Requested by: [name], MD [address] [place],  [place] [address] PCP: [name], MD   Assessment & Plan: Visit Diagnoses:  1. Primary osteoarthritis of right hip     Plan: Given patient's finding on MRI and her failure conservative treatment which is included time and an intra-articular injection in the hip recommend right total hip arthroplasty.  Patient would like to proceed with this in the near future.  Risk benefits of surgery discussed with patient.  Handout on anterior hip replacement is given.  Risk are reviewed with patient and include but are not limited to DVT, blood loss, nerve vessel injury, wound healing problems and prolonged pain.  Questions were encouraged and answered by Dr. Ninfa Linden myself.  Hip components were shown to the patient today.  See her back 2 weeks postop. The patient also notes having some numbness tingling involving her right arm in the ulnar distribution just recently developed no known injury.  She has positive Tinel's over the ulnar nerve at the elbow.  She reports that her primary care doctor is working this up and did some blood work.  She may benefit from EMG nerve conduction studies in the future rule out ulnar nerve entrapment.  We can definitely work this up postop.  She continues to have numbness tingling in the ulnar nerve distribution of the right hand.  Follow-Up Instructions: Return for post op.   Orders:  No orders of the defined types were placed in this encounter.  No orders of the defined types were placed in this encounter.     Procedures: No procedures performed   Clinical Data: No additional findings.   Subjective: Chief Complaint  Patient presents with   Right Hip - Pain    HPI Mary Blevins returns today to go over the MRI of her right hip.   She continues to have right hip groin pain.  She is tried an intra-articular injection in the hip.  She is also tried ambulating with a cane.  She taken hydrocodone and at night.  She is having difficulty sleeping due to the right hip pain.  MRI right hip is reviewed with the patient.  MRI shows high-grade partial-thickness cartilage loss in the areas of full-thickness cartilage loss involving the femoral head and acetabulum.  Findings are consistent with severe osteoarthritis right hip.  Large joint effusion also noted.  Left hip with mild osteoarthritis. Review of Systems Denies any fevers chills shortness of breath chest pain.  Objective: Vital Signs: There were no vitals taken for this visit.  Physical Exam General well-developed well-nourished female no acute distress.  Mood and affect appropriate Ortho Exam Ambulates with a cane with an antalgic gait on the right.  Limited internal and external rotation of the right hip pain. Specialty Comments:  No specialty comments available.  Imaging: No results found.   PMFS History: Patient Active Problem List   Diagnosis Date Noted   Leg cramping 01/07/2020   GAD (generalized anxiety disorder) 12/20/2016   Chronic insomnia 12/20/2016   Aortic atherosclerosis ([place]) 06/09/2016   Benign neoplasm of ascending colon    Benign neoplasm of transverse colon    Benign neoplasm of  sigmoid colon    Diverticulosis of sigmoid colon    Grade I internal hemorrhoids    PSVT (paroxysmal supraventricular tachycardia) ([facility]) 02/17/2015   Back pain 07/22/2012   Acute pain of left hip 07/22/2012   Stricture and stenosis of esophagus 09/18/2011   Hx of colonic polyps 09/18/2011   COPD (chronic obstructive pulmonary disease) ([place]) 07/14/2010   Generalized headaches 07/14/2010   Osteopenia 07/14/2010   Hypothyroid 07/14/2010   Elevated lipids 07/14/2010   Past Medical History:  Diagnosis Date   Arthritis    COPD (chronic obstructive pulmonary  disease) (HCC)    Elevated lipids    Generalized headaches    History of pneumonia    Hyperlipidemia    Hypothyroidism    MVP (mitral valve prolapse)    palpitations   Osteopenia    Seasonal allergies    Sinusitis    Tubular adenoma of colon     Family History  Problem Relation Age of Onset   Breast cancer Maternal Aunt    Heart attack Maternal Grandmother    Heart attack Mother    Heart disease Mother    Dementia Mother    Diabetes Brother    Prostate cancer Maternal Grandfather    Healthy Father    Healthy Sister    Heart attack Other        NIECE   Colon cancer Neg Hx    Esophageal cancer Neg Hx    Rectal cancer Neg Hx    Stomach cancer Neg Hx     Past Surgical History:  Procedure Laterality Date   CATARACT EXTRACTION Left    COLONOSCOPY WITH PROPOFOL N/A 01/06/2016   Procedure: COLONOSCOPY WITH PROPOFOL;  Surgeon: Doran Stabler, MD;  Location: [facility];  Service: Gastroenterology;  Laterality: N/A;   DILATION AND CURETTAGE OF UTERUS     THYROIDECTOMY     TONSILLECTOMY     Social History   Occupational History    Employer: [employer]  Tobacco Use   Smoking status: Former    Types: Cigarettes    Quit date: 07/24/2005    Years since quitting: 15.0   Smokeless tobacco: Never  Vaping Use   Vaping Use: Never used  Substance and Sexual Activity   Alcohol use: Yes    Comment: rare   Drug use: No   Sexual activity: Not on file

## 2020-08-06 ENCOUNTER — Telehealth: Payer: Self-pay

## 2020-08-06 NOTE — Telephone Encounter (Signed)
Pt called to inform of the surgery she will be having on the hip. Told the hip shows arthritis and fluid. Surgery will be in the month of September. Awaiting surgery date.

## 2020-08-10 ENCOUNTER — Telehealth: Payer: Self-pay | Admitting: Physician Assistant

## 2020-08-10 ENCOUNTER — Other Ambulatory Visit: Payer: Self-pay | Admitting: Orthopaedic Surgery

## 2020-08-10 MED ORDER — HYDROCODONE-ACETAMINOPHEN 5-325 MG PO TABS: 1.0000 | ORAL_TABLET | Freq: Three times a day (TID) | ORAL | 0 refills | Status: DC | PRN

## 2020-08-10 NOTE — Telephone Encounter (Signed)
Pt called stating her hydrocodone rx is about to run out and she would like a refill sent and would like a CB when that's done.   [phone]

## 2020-08-10 NOTE — Telephone Encounter (Signed)
I called and informed pt and she stated understanding. She wanted to know when she will be able to have sx done. She said she hasnt been called yet. Please advise

## 2020-08-10 NOTE — Telephone Encounter (Signed)
Please advise 

## 2020-08-13 NOTE — Telephone Encounter (Signed)
I called patient and scheduled surgery. 

## 2020-08-16 ENCOUNTER — Encounter: Payer: Self-pay | Admitting: Gastroenterology

## 2020-08-19 ENCOUNTER — Telehealth: Payer: Self-pay

## 2020-08-19 ENCOUNTER — Other Ambulatory Visit: Payer: Self-pay

## 2020-08-19 DIAGNOSIS — J449 Chronic obstructive pulmonary disease, unspecified: Secondary | ICD-10-CM | POA: Diagnosis not present

## 2020-08-19 DIAGNOSIS — R200 Anesthesia of skin: Secondary | ICD-10-CM

## 2020-08-20 NOTE — Telephone Encounter (Signed)
Referral placed for neurology due to numbness in fingers

## 2020-08-27 ENCOUNTER — Other Ambulatory Visit: Payer: Self-pay | Admitting: Interventional Cardiology

## 2020-08-30 ENCOUNTER — Telehealth: Payer: Self-pay

## 2020-08-30 ENCOUNTER — Other Ambulatory Visit: Payer: Self-pay | Admitting: Family Medicine

## 2020-08-30 NOTE — Telephone Encounter (Signed)
Pt called in requesting a refill of tiZANidine (ZANAFLEX) 4 MG tablet sent to pharmacy.  Cb#: [phone]

## 2020-08-30 NOTE — Telephone Encounter (Signed)
Duplicate request

## 2020-08-31 ENCOUNTER — Telehealth: Payer: Self-pay | Admitting: Orthopaedic Surgery

## 2020-08-31 ENCOUNTER — Emergency Department (HOSPITAL_COMMUNITY): Payer: Medicare HMO

## 2020-08-31 ENCOUNTER — Other Ambulatory Visit: Payer: Self-pay

## 2020-08-31 ENCOUNTER — Emergency Department (HOSPITAL_COMMUNITY)
Admission: EM | Admit: 2020-08-31 | Discharge: 2020-08-31 | Disposition: A | Discharge status: Home / Self Care | Payer: Medicare HMO | Attending: Emergency Medicine | Admitting: Emergency Medicine

## 2020-08-31 ENCOUNTER — Encounter (HOSPITAL_COMMUNITY): Payer: Self-pay

## 2020-08-31 DIAGNOSIS — R0602 Shortness of breath: Secondary | ICD-10-CM

## 2020-08-31 DIAGNOSIS — J449 Chronic obstructive pulmonary disease, unspecified: Secondary | ICD-10-CM

## 2020-08-31 DIAGNOSIS — Z79899 Other long term (current) drug therapy: Secondary | ICD-10-CM | POA: Diagnosis not present

## 2020-08-31 DIAGNOSIS — E039 Hypothyroidism, unspecified: Secondary | ICD-10-CM | POA: Insufficient documentation

## 2020-08-31 DIAGNOSIS — Z96641 Presence of right artificial hip joint: Secondary | ICD-10-CM | POA: Insufficient documentation

## 2020-08-31 DIAGNOSIS — Z20822 Contact with and (suspected) exposure to covid-19: Secondary | ICD-10-CM | POA: Insufficient documentation

## 2020-08-31 DIAGNOSIS — J441 Chronic obstructive pulmonary disease with (acute) exacerbation: Secondary | ICD-10-CM | POA: Diagnosis not present

## 2020-08-31 DIAGNOSIS — Z86010 Personal history of colonic polyps: Secondary | ICD-10-CM | POA: Diagnosis not present

## 2020-08-31 DIAGNOSIS — Z87891 Personal history of nicotine dependence: Secondary | ICD-10-CM | POA: Diagnosis not present

## 2020-08-31 LAB — CBC
HCT: 41.5 % (ref 36.0–46.0)
Hemoglobin: 13.3 g/dL (ref 12.0–15.0)
MCH: 29.9 pg (ref 26.0–34.0)
MCHC: 32 g/dL (ref 30.0–36.0)
MCV: 93.3 fL (ref 80.0–100.0)
Platelets: 358 10*3/uL (ref 150–400)
RBC: 4.45 MIL/uL (ref 3.87–5.11)
RDW: 14.2 % (ref 11.5–15.5)
WBC: 8.7 10*3/uL (ref 4.0–10.5)
nRBC: 0 % (ref 0.0–0.2)

## 2020-08-31 LAB — BASIC METABOLIC PANEL
Anion gap: 6 (ref 5–15)
BUN: 10 mg/dL (ref 8–23)
CO2: 26 mmol/L (ref 22–32)
Calcium: 8.7 mg/dL — ABNORMAL LOW (ref 8.9–10.3)
Chloride: 105 mmol/L (ref 98–111)
Creatinine, Ser: 0.62 mg/dL (ref 0.44–1.00)
GFR, Estimated: >60 mL/min (ref >60)
Glucose, Bld: 92 mg/dL (ref 70–99)
Potassium: 3.5 mmol/L (ref 3.5–5.1)
Sodium: 137 mmol/L (ref 135–145)

## 2020-08-31 LAB — TROPONIN I (HIGH SENSITIVITY)
Troponin I (High Sensitivity): <2 ng/L (ref <18)
Troponin I (High Sensitivity): <2 ng/L (ref <18)

## 2020-08-31 LAB — RESP PANEL BY RT-PCR (FLU A&B, COVID) ARPGX2
Influenza A by PCR: NEGATIVE
Influenza B by PCR: NEGATIVE
SARS Coronavirus 2 by RT PCR: NEGATIVE

## 2020-08-31 NOTE — ED Notes (Signed)
Pt up to [facility] returned to bed. No complaints at this time. Will continue to monitor.

## 2020-08-31 NOTE — ED Triage Notes (Addendum)
Pt. Is complaining of SHOB. Pt. States they have been waking up with a headache and their right foot swells all the time.

## 2020-08-31 NOTE — ED Provider Notes (Signed)
[facility] EMERGENCY DEPARTMENT Provider Note   CSN: [id number] Arrival date & time: 08/31/20  1451     History Chief Complaint  Patient presents with   Shortness of Breath    Mary Blevins is a 74 y.o. female.   Shortness of Breath Associated symptoms: no abdominal pain, no chest pain and no rash   Patient presents with shortness of breath.  Has had for the last few days.  Worse with exertion.  States she cannot walk the distance used to.  States she is staying at her sister's house and then sister smokes.  History of COPD.  States she has been coughing a dry cough but states she is also been bringing up sputum.  No chest pain.  States she does have some swelling in her right foot.  States this is chronic for her.  States she is due to have a hip replacement on the right side.  No fevers or chills.No fevers or chills.  States she thinks it may be because of his history of smoking in the house.    Past Medical History:  Diagnosis Date   Arthritis    COPD (chronic obstructive pulmonary disease) (HCC)    Elevated lipids    Generalized headaches    History of pneumonia    Hyperlipidemia    Hypothyroidism    MVP (mitral valve prolapse)    palpitations   Osteopenia    Seasonal allergies    Sinusitis    Tubular adenoma of colon     Patient Active Problem List   Diagnosis Date Noted   Leg cramping 01/07/2020   GAD (generalized anxiety disorder) 12/20/2016   Chronic insomnia 12/20/2016   Aortic atherosclerosis ([place]) 06/09/2016   Benign neoplasm of ascending colon    Benign neoplasm of transverse colon    Benign neoplasm of sigmoid colon    Diverticulosis of sigmoid colon    Grade I internal hemorrhoids    PSVT (paroxysmal supraventricular tachycardia) ([place]) 02/17/2015   Back pain 07/22/2012   Acute pain of left hip 07/22/2012   Stricture and stenosis of esophagus 09/18/2011   Hx of colonic polyps 09/18/2011   COPD (chronic obstructive pulmonary disease) ([place])  07/14/2010   Generalized headaches 07/14/2010   Osteopenia 07/14/2010   Hypothyroid 07/14/2010   Elevated lipids 07/14/2010    Past Surgical History:  Procedure Laterality Date   CATARACT EXTRACTION Left    COLONOSCOPY WITH PROPOFOL N/A 01/06/2016   Procedure: COLONOSCOPY WITH PROPOFOL;  Surgeon: Doran Stabler, MD;  Location: [facility];  Service: Gastroenterology;  Laterality: N/A;   DILATION AND CURETTAGE OF UTERUS     THYROIDECTOMY     TONSILLECTOMY       OB History   No obstetric history on file.     Family History  Problem Relation Age of Onset   Breast cancer Maternal Aunt    Heart attack Maternal Grandmother    Heart attack Mother    Heart disease Mother    Dementia Mother    Diabetes Brother    Prostate cancer Maternal Grandfather    Healthy Father    Healthy Sister    Heart attack Other        NIECE   Colon cancer Neg Hx    Esophageal cancer Neg Hx    Rectal cancer Neg Hx    Stomach cancer Neg Hx     Social History   Tobacco Use   Smoking status: Former    Types: Cigarettes  Quit date: 07/24/2005    Years since quitting: 15.1   Smokeless tobacco: Never  Vaping Use   Vaping Use: Never used  Substance Use Topics   Alcohol use: Yes    Comment: rare   Drug use: No    Home Medications Prior to Admission medications   Medication Sig Start Date End Date Taking? Authorizing Provider  albuterol (PROVENTIL) (2.5 MG/3ML) 0.083% nebulizer solution Take 3 mLs (2.5 mg total) by nebulization every 6 (six) hours as needed for wheezing or shortness of breath. 06/03/20  Yes [name], MD  ALPRAZolam Duanne Moron) 0.5 MG tablet Take 1 tablet (0.5 mg total) by mouth 3 (three) times daily as needed. 01/28/19  Yes [name], MD  budesonide-formoterol [facility]) 160-4.5 MCG/ACT inhaler Inhale 2 puffs into the lungs 2 (two) times daily. 07/08/19  Yes [name], MD  fluticasone (FLONASE) 50 MCG/ACT nasal spray SPRAY 2 SPRAYS INTO EACH NOSTRIL EVERY  DAY Patient taking differently: Place 2 sprays into both nostrils daily. 07/11/18  Yes [name], MD  HYDROcodone bit-homatropine (HYCODAN) 5-1.5 MG/5ML syrup Take 5 mLs by mouth every 8 (eight) hours as needed for cough. 07/15/20  Yes [name], MD  HYDROcodone-acetaminophen (NORCO) 5-325 MG tablet Take 1 tablet by mouth 3 (three) times daily as needed for moderate pain. 08/10/20  Yes Mcarthur Rossetti, MD  hydrOXYzine (ATARAX/VISTARIL) 25 MG tablet Take 1 tablet (25 mg total) by mouth 3 (three) times daily as needed for itching. 12/12/19  Yes [name], MD  levothyroxine (SYNTHROID) 50 MCG tablet TAKE 1 TABLET BY MOUTH DAILY BEFORE BREAKFAST 11/28/19  Yes [name], MD  OVER THE COUNTER MEDICATION Apply 1 application topically 2 (two) times daily. Stop pain roll-on   Yes [provider]  Propylene Glycol-Glycerin 0.6-0.6 % SOLN Apply to eye.   Yes [provider]  rosuvastatin (CRESTOR) 5 MG tablet Take 1 tablet (5 mg total) by mouth daily. Please make overdue appt with Dr. Tamala Julian before anymore refills. Thank you  2nd attempt 08/27/20  Yes Belva Crome, MD  Tiotropium Bromide Monohydrate (SPIRIVA RESPIMAT) 2.5 MCG/ACT AERS Inhale 2 puffs into the lungs daily. 03/10/20  Yes Sood, Elisabeth Cara, MD  tiZANidine (ZANAFLEX) 4 MG tablet TAKE 1 TABLET BY MOUTH EVERY 6 HOURS AS NEEDED FOR MUSCLE SPASMS. 08/30/20  Yes [name], MD  verapamil (CALAN-SR) 120 MG CR tablet Take 1 tablet (120 mg total) by mouth daily. 11/28/19  Yes [name], MD  Budeson-Glycopyrrol-Formoterol (BREZTRI AEROSPHERE) 160-9-4.8 MCG/ACT AERO Inhale 2 puffs into the lungs in the morning and at bedtime. Patient not taking: No sig reported 06/30/20   Chesley Mires, MD  diclofenac (VOLTAREN) 75 MG EC tablet Take 1 tablet (75 mg total) by mouth 2 (two) times daily as needed (arthritis). Patient not taking: No sig reported 06/04/20   [name], MD  meclizine (ANTIVERT) 25 MG  tablet Take 1 tablet (25 mg total) by mouth 3 (three) times daily as needed for dizziness. Patient not taking: No sig reported 02/10/19   [name], MD  predniSONE (STERAPRED UNI-PAK 21 TAB) 10 MG (21) TBPK tablet TAKE 6 TABLETS ON DAY 1 AS DIRECTED ON PACKAGE AND DECREASE BY 1 TAB EACH DAY FOR A TOTAL OF 6 DAYS Patient not taking: No sig reported 08/05/20   [provider]    Allergies    Meloxicam  Review of Systems   Review of Systems  Constitutional:  Negative for  appetite change.  HENT:  Negative for congestion.   Respiratory:  Positive for shortness of breath.   Cardiovascular:  Negative for chest pain and leg swelling.  Gastrointestinal:  Negative for abdominal pain.  Genitourinary:  Negative for flank pain.  Musculoskeletal:  Negative for back pain.  Skin:  Negative for rash.  Neurological:  Negative for weakness.  Psychiatric/Behavioral:  Negative for confusion.    Physical Exam Updated Vital Signs BP (!) 155/91   Pulse 69   Temp 98.9 F (37.2 C) (Oral)   Resp 18   Ht '5\' 5"'$  (1.651 m)   Wt 73.5 kg   SpO2 95%   BMI 26.96 kg/m   Physical Exam Vitals and nursing note reviewed.  Cardiovascular:     Rate and Rhythm: Normal rate and regular rhythm.  Pulmonary:     Breath sounds: Normal breath sounds. No wheezing, rhonchi or rales.  Chest:     Chest wall: No tenderness.  Abdominal:     Tenderness: There is no abdominal tenderness.  Musculoskeletal:     Cervical back: Neck supple.     Right lower leg: No edema.     Left lower leg: No edema.  Skin:    General: Skin is warm.     Capillary Refill: Capillary refill takes less than 2 seconds.  Neurological:     Mental Status: She is alert and oriented to person, place, and time.    ED Results / Procedures / Treatments   Labs (all labs ordered are listed, but only abnormal results are displayed) Labs Reviewed  BASIC METABOLIC PANEL - Abnormal; Notable for the following components:      Result  Value   Calcium 8.7 (*)    All other components within normal limits  RESP PANEL BY RT-PCR (FLU A&B, COVID) [id number]  CBC  TROPONIN I (HIGH SENSITIVITY)  TROPONIN I (HIGH SENSITIVITY)    EKG EKG Interpretation  Date/Time:  Tuesday August 31 2020 15:54:18 EDT Ventricular Rate:  73 PR Interval:  130 QRS Duration: 84 QT Interval:  386 QTC Calculation: 425 R Axis:   81 Text Interpretation: Sinus rhythm with occasional Premature ventricular complexes Anteroseptal infarct , age undetermined Abnormal ECG Confirmed by Davonna Belling [phone]) on 08/31/2020 7:14:09 PM  Radiology DG Chest 2 View  Result Date: 08/31/2020 CLINICAL DATA:  Shortness of breath EXAM: CHEST - 2 VIEW COMPARISON:  Chest radiograph 01/06/2018 FINDINGS: The cardiomediastinal silhouette is within normal limits. There is calcified atherosclerotic plaque of the aortic arch. The lungs are hyperinflated with flattening of the diaphragms consistent with the history of COPD. Streaky opacities in the lung bases likely reflect subsegmental atelectasis. There is no focal consolidation or pulmonary edema. There is no pleural effusion or pneumothorax. There is mild compression deformity of a lower thoracic vertebral body, likely T12 as seen on the prior lumbar spine MRI. There is no acute osseous abnormality. IMPRESSION: 1. No radiographic evidence of acute cardiopulmonary process. 2. Hyperinflation consistent with COPD. Electronically Signed   By: Valetta Mole MD   On: 08/31/2020 16:33    Procedures Procedures   Medications Ordered in ED Medications - No data to display  ED Course  I have reviewed the triage vital signs and the nursing notes.  Pertinent labs & imaging results that were available during my care of the patient were reviewed by me and considered in my medical decision making (see chart for details).    MDM Rules/Calculators/A&P  Patient with shortness of breath.  Has had recently when she  has been at a different house with someone that smokes.  States it is improved a little since the person stopped smoking in the house.  X-ray reassuring.  Some mild COPD.  Not wheezing currently.  Recently has been on steroids.  Do not think she needs another course at this time.  Doubt cardiac cause.  Does not appear to be an anginal equivalent with shortness of breath.  Will discharge home with outpatient follow-up as needed. Final Clinical Impression(s) / ED Diagnoses Final diagnoses:  Shortness of breath  Chronic obstructive pulmonary disease, unspecified COPD type [facility])    Rx / DC Orders ED Discharge Orders     None        Davonna Belling, MD 09/01/20 [phone]

## 2020-08-31 NOTE — Telephone Encounter (Signed)
Called patient twice, no answer, no voicemail set up. Will try back later

## 2020-08-31 NOTE — Telephone Encounter (Signed)
Pt called requesting a call back. Pt has surgery in Sept and has after surgery care questions. Please call pt about this matter at [phone].

## 2020-09-01 ENCOUNTER — Telehealth: Payer: Self-pay | Admitting: Orthopaedic Surgery

## 2020-09-01 ENCOUNTER — Telehealth: Payer: Self-pay | Admitting: Family Medicine

## 2020-09-01 NOTE — Telephone Encounter (Signed)
Surgery questions answered

## 2020-09-01 NOTE — Telephone Encounter (Signed)
Pt called requesting a call back from [facility]. She states she has questions about her upcoming surgery. Please call pt at [phone].

## 2020-09-01 NOTE — Telephone Encounter (Signed)
Patient left a voicemail message yesterday after hours to inform provider she was at the ED for sob. Patient stated they were preparing to do an EKG and an x-ray. Please advise at [phone].

## 2020-09-02 ENCOUNTER — Other Ambulatory Visit: Payer: Self-pay

## 2020-09-02 ENCOUNTER — Encounter: Payer: Self-pay | Admitting: Nurse Practitioner

## 2020-09-02 ENCOUNTER — Ambulatory Visit (INDEPENDENT_AMBULATORY_CARE_PROVIDER_SITE_OTHER): Payer: Medicare HMO | Admitting: Nurse Practitioner

## 2020-09-02 VITALS — BP 114/72 | HR 86 | Temp 98.2°F | Ht 65.0 in | Wt 169.6 lb

## 2020-09-02 DIAGNOSIS — J418 Mixed simple and mucopurulent chronic bronchitis: Secondary | ICD-10-CM | POA: Diagnosis not present

## 2020-09-02 NOTE — Telephone Encounter (Signed)
Noted  

## 2020-09-02 NOTE — Progress Notes (Signed)
Subjective:    Patient ID: Mary Blevins, female    DOB: May 31, 1945, 75 y.o.   MRN: [id number]  HPI: Mary Blevins is a 75 y.o. female presenting for COPD exacerbation.  Chief Complaint  Patient presents with   COPD    Around second hand smoke, causing sob. Went to [facility], no meds issued. Asking for prednisone for breathing.  No longer taking spiriva and symbicort  Has question about o2 machine levels and how often to use   ER FOLLOW UP Time since discharge:   Hospital/facility:  Diagnosis: COPD exacerbation  Procedures/tests: chest x-ray - no evidence of acute cardiopulmonary process.  Hyperinflation consistent with COPD  EKG - NSR with occasional PVC  Flu/COVID-19 negative  BMET - normal with exception of slightly low calcium  Troponin - <2 x 2  Consultants: none  New medications: none  Discharge instructions:  continue to avoid trigger, follow up with PCP  Status: better   COPD Reports she has been around sister in law who smokes.  She has asked her sister to stop smoking around her.   Started back on [place] a couple of days ago.  Not sure why she stopped taking it.    COPD status: better Satisfied with current treatment?: yes Oxygen use: yes; has oxygen at home that she uses if she feels short of breath only Dyspnea frequency: rarely, not every day Cough frequency: in the morning Rescue inhaler frequency:  not every day Limitation of activity: no Productive cough: no Last Spirometry: 2019 Pneumovax: Up to Date Influenza: Up to Date - planning to get high dose shot in next couple of months.  Patient has many questions about COPD today including palliative care, oxygen use, and inhaler use.    Allergies  Allergen Reactions   Meloxicam Other (See Comments)    BAD DREAMS, SAD THOUGHTS    Outpatient Encounter Medications as of 09/02/2020  Medication Sig Note   albuterol (PROVENTIL) (2.5 MG/3ML) 0.083% nebulizer solution Take 3 mLs (2.5 mg  total) by nebulization every 6 (six) hours as needed for wheezing or shortness of breath.    ALPRAZolam (XANAX) 0.5 MG tablet Take 1 tablet (0.5 mg total) by mouth 3 (three) times daily as needed. 01/07/2020: ONLY AS NEEDED   Budeson-Glycopyrrol-Formoterol (BREZTRI AEROSPHERE) 160-9-4.8 MCG/ACT AERO Inhale 2 puffs into the lungs in the morning and at bedtime.    diclofenac (VOLTAREN) 75 MG EC tablet Take 1 tablet (75 mg total) by mouth 2 (two) times daily as needed (arthritis).    fluticasone (FLONASE) 50 MCG/ACT nasal spray SPRAY 2 SPRAYS INTO EACH NOSTRIL EVERY DAY (Patient taking differently: Place 2 sprays into both nostrils daily.)    HYDROcodone bit-homatropine (HYCODAN) 5-1.5 MG/5ML syrup Take 5 mLs by mouth every 8 (eight) hours as needed for cough.    HYDROcodone-acetaminophen (NORCO) 5-325 MG tablet Take 1 tablet by mouth 3 (three) times daily as needed for moderate pain.    hydrOXYzine (ATARAX/VISTARIL) 25 MG tablet Take 1 tablet (25 mg total) by mouth 3 (three) times daily as needed for itching.    levothyroxine (SYNTHROID) 50 MCG tablet TAKE 1 TABLET BY MOUTH DAILY BEFORE BREAKFAST    meclizine (ANTIVERT) 25 MG tablet Take 1 tablet (25 mg total) by mouth 3 (three) times daily as needed for dizziness. 08/04/2020: PRN   OVER THE COUNTER MEDICATION Apply 1 application topically 2 (two) times daily. Stop pain roll-on    Propylene Glycol-Glycerin 0.6-0.6 % SOLN Apply to eye.  rosuvastatin (CRESTOR) 5 MG tablet Take 1 tablet (5 mg total) by mouth daily. Please make overdue appt with Dr. Tamala Julian before anymore refills. Thank you  2nd attempt    tiZANidine (ZANAFLEX) 4 MG tablet TAKE 1 TABLET BY MOUTH EVERY 6 HOURS AS NEEDED FOR MUSCLE SPASMS.    verapamil (CALAN-SR) 120 MG CR tablet Take 1 tablet (120 mg total) by mouth daily.    [DISCONTINUED] budesonide-formoterol (SYMBICORT) 160-4.5 MCG/ACT inhaler Inhale 2 puffs into the lungs 2 (two) times daily. (Patient not taking: Reported on 09/02/2020)     [DISCONTINUED] predniSONE (STERAPRED UNI-PAK 21 TAB) 10 MG (21) TBPK tablet TAKE 6 TABLETS ON DAY 1 AS DIRECTED ON PACKAGE AND DECREASE BY 1 TAB EACH DAY FOR A TOTAL OF 6 DAYS (Patient not taking: No sig reported)    [DISCONTINUED] Tiotropium Bromide Monohydrate (SPIRIVA RESPIMAT) 2.5 MCG/ACT AERS Inhale 2 puffs into the lungs daily.    No facility-administered encounter medications on file as of 09/02/2020.    Patient Active Problem List   Diagnosis Date Noted   Leg cramping 01/07/2020   GAD (generalized anxiety disorder) 12/20/2016   Chronic insomnia 12/20/2016   Aortic atherosclerosis ([place]) 06/09/2016   Benign neoplasm of ascending colon    Benign neoplasm of transverse colon    Benign neoplasm of sigmoid colon    Diverticulosis of sigmoid colon    Grade I internal hemorrhoids    PSVT (paroxysmal supraventricular tachycardia) ([place]) 02/17/2015   Back pain 07/22/2012   Acute pain of left hip 07/22/2012   Stricture and stenosis of esophagus 09/18/2011   Hx of colonic polyps 09/18/2011   COPD (chronic obstructive pulmonary disease) ([place]) 07/14/2010   Generalized headaches 07/14/2010   Osteopenia 07/14/2010   Hypothyroid 07/14/2010   Elevated lipids 07/14/2010    Past Medical History:  Diagnosis Date   Arthritis    COPD (chronic obstructive pulmonary disease) ([facility])    Elevated lipids    Generalized headaches    History of pneumonia    Hyperlipidemia    Hypothyroidism    MVP (mitral valve prolapse)    palpitations   Osteopenia    Seasonal allergies    Sinusitis    Tubular adenoma of colon     Relevant past medical, surgical, family and social history reviewed and updated as indicated. Interim medical history since our last visit reviewed.  Review of Systems Per HPI unless specifically indicated above     Objective:    BP 114/72   Pulse 86   Temp 98.2 F (36.8 C)   Ht '5\' 5"'$  (1.651 m)   Wt 169 lb 9.6 oz (76.9 kg)   SpO2 95%   BMI 28.22 kg/m   Wt Readings  from Last 3 Encounters:  09/02/20 169 lb 9.6 oz (76.9 kg)  08/31/20 162 lb (73.5 kg)  08/04/20 167 lb 6.4 oz (75.9 kg)    Physical Exam Vitals and nursing note reviewed.  Constitutional:      General: She is not in acute distress.    Appearance: Normal appearance. She is not toxic-appearing.  Cardiovascular:     Rate and Rhythm: Normal rate and regular rhythm.     Heart sounds: Normal heart sounds. No murmur heard. Pulmonary:     Effort: Pulmonary effort is normal. No respiratory distress.     Breath sounds: Normal breath sounds. No wheezing, rhonchi or rales.  Skin:    General: Skin is warm and dry.     Coloration: Skin is not jaundiced or pale.  Findings: No erythema.  Neurological:     Mental Status: She is alert and oriented to person, place, and time.     Motor: No weakness.     Gait: Gait normal.  Psychiatric:        Mood and Affect: Mood normal.        Behavior: Behavior normal.        Thought Content: Thought content normal.        Judgment: Judgment normal.      Assessment & Plan:   Problem List Items Addressed This Visit       Respiratory   COPD (chronic obstructive pulmonary disease) ([place]) - Primary    Chronic.  Discussed this diagnosis at length with the patient.  Encouraged consultation with palliative care if she is interested - she prefers to think about this.  Follows with Pulmonary already.  Discussed oxygen use and only to use at home if O2 saturation less than 88%.  We do not want her oxygen saturation to be more than 96-98%.  I do not feel she is in exacerbation today and I do not think she needs steroids or antibiotics.  I encouraged her to use daily maintenance inhaler Judithann Sauger) to prevent exacerbations.  Continue collaboration with Pulmonary.  Return to clinic if symptoms worsen.        Follow up plan: Return if symptoms worsen or fail to improve.

## 2020-09-08 ENCOUNTER — Other Ambulatory Visit: Payer: Self-pay | Admitting: Interventional Cardiology

## 2020-09-08 NOTE — Assessment & Plan Note (Addendum)
Chronic.  Discussed this diagnosis at length with the patient.  Encouraged consultation with palliative care if she is interested - she prefers to think about this.  Follows with Pulmonary already.  Discussed oxygen use and only to use at home if O2 saturation less than 88%.  We do not want her oxygen saturation to be more than 96-98%.  I do not feel she is in exacerbation today and I do not think she needs steroids or antibiotics.  I encouraged her to use daily maintenance inhaler Judithann Sauger) to prevent exacerbations.  Continue collaboration with Pulmonary.  Return to clinic if symptoms worsen.

## 2020-09-09 ENCOUNTER — Telehealth: Payer: Self-pay | Admitting: Pulmonary Disease

## 2020-09-09 NOTE — Telephone Encounter (Signed)
Called and spoke with patient who states that since she started [place] she feels like it's messing with her vision and making it blurry. Patient was changed to [facility] from Spiriva and Symbicort. Patient wants to stop Breztri and go back on Symbicort and Spiriva.    Dr. Halford Chessman please advise.

## 2020-09-10 NOTE — Telephone Encounter (Signed)
Pt is returning phone call in regards to medication reaction. Pls regard; [phone].

## 2020-09-10 NOTE — Telephone Encounter (Signed)
Okay to stop breztri.  She can resume symbicort 160 two puffs bid and spiriva respimat 2.5 mcg two puffs daily.

## 2020-09-10 NOTE — Telephone Encounter (Signed)
LMTCB

## 2020-09-10 NOTE — Telephone Encounter (Signed)
Called and spoke with patient to let her know recommendations from Dr. Halford Chessman. She expressed understanding. Nothing further needed at this time.

## 2020-09-10 NOTE — Telephone Encounter (Signed)
No answer. No way to leave a voicemail.

## 2020-09-13 ENCOUNTER — Other Ambulatory Visit: Payer: Self-pay | Admitting: Physician Assistant

## 2020-09-14 ENCOUNTER — Telehealth: Payer: Self-pay | Admitting: Pulmonary Disease

## 2020-09-14 ENCOUNTER — Telehealth: Payer: Self-pay | Admitting: Family Medicine

## 2020-09-14 NOTE — Telephone Encounter (Signed)
Patient left voicemail message yesterday to follow up on 4 things as follows:   1) wants nurse to check chart to see if she's had a flu shot 2) provider told her to get a shinex shot at drug store (similar to shingles shot) 3) wants samples of spiriva or symbacort 4) following up on referral to neurologist to address numbness with hand.  Please advise at [phone].

## 2020-09-15 NOTE — Patient Instructions (Addendum)
DUE TO COVID-19 ONLY ONE VISITOR IS ALLOWED TO COME WITH YOU AND STAY IN THE WAITING ROOM ONLY DURING PRE OP AND PROCEDURE.   **NO VISITORS ARE ALLOWED IN THE SHORT STAY AREA OR RECOVERY ROOM!!**  IF YOU WILL BE ADMITTED INTO THE HOSPITAL YOU ARE ALLOWED ONLY TWO SUPPORT PEOPLE DURING VISITATION HOURS ONLY (10AM -8PM)   The support person(s) may change daily. The support person(s) must pass our screening, gel in and out, and wear a mask at all times, including in the patient's room. Patients must also wear a mask when staff or their support person are in the room.  No visitors under the age of 52. Any visitor under the age of 28 must be accompanied by an adult.    COVID SWAB TESTING MUST BE COMPLETED ON:  09/22/20 **MUST PRESENT COMPLETED FORM AT TESTING SITE**    [facility] (backside of the building) Open 8am-3pm. No appointment needed. You are not required to quarantine, however you are required to wear a well-fitted mask when you are out and around people not in your household.  Hand Hygiene often Do NOT share personal items Notify your provider if you are in close contact with someone who has COVID or you develop fever 100.4 or greater, new onset of sneezing, cough, sore throat, shortness of breath or body aches.       Your procedure is scheduled on: 09/24/20   Report to [facility] Main  Entrance    Report to admitting at 8:15 AM   Call this number if you have problems the morning of surgery [phone]   Do not eat food :After Midnight.   May have liquids until 8:00 AM day of surgery  CLEAR LIQUID DIET  Foods Allowed                                                                     Foods Excluded  Water, Black Coffee and tea (no milk or creamer)            liquids that you cannot  Plain Jell-O in any flavor  (No red)                                     see through such as: Fruit ices (not with fruit pulp)                                              milk, soups, orange juice              Iced Popsicles (No red)                                                 All solid food  Apple juices Sports drinks like Gatorade (No red) Lightly seasoned clear broth or consume(fat free) Sugar, honey syrup     The day of surgery:  Drink ONE (1) Pre-Surgery Clear Ensure by 8:00 am the morning of surgery. Drink in one sitting. Do not sip.  This drink was given to you during your hospital  pre-op appointment visit. Nothing else to drink after completing the  Pre-Surgery Clear Ensure.          If you have questions, please contact your surgeon's office.     Oral Hygiene is also important to reduce your risk of infection.                                    Remember - BRUSH YOUR TEETH THE MORNING OF SURGERY WITH YOUR REGULAR TOOTHPASTE   Take these medicines the morning of surgery with A SIP OF WATER: Inhalers, Alprazolam, Norco, Hydroxyzine, Levothyroxine, Crestor, Zanaflex                              You may not have any metal on your body including hair pins, jewelry, and body piercing             Do not wear make-up, lotions, powders, perfumes, or deodorant  Do not wear nail polish including gel and S&S, artificial/acrylic nails, or any other type of covering on natural nails including finger and toenails. If you have artificial nails, gel coating, etc. that needs to be removed by a nail salon please have this removed prior to surgery or surgery may need to be canceled/ delayed if the surgeon/ anesthesia feels like they are unable to be safely monitored.   Do not shave  48 hours prior to surgery.    Do not bring valuables to the hospital. [place].   Contacts, dentures or bridgework may not be worn into surgery.   Bring small overnight bag day of surgery.    Please read over the following fact sheets you were given: IF YOU HAVE QUESTIONS ABOUT YOUR  PRE OP INSTRUCTIONS PLEASE CALL [phone]- [facility] - Preparing for Surgery Before surgery, you can play an important role.  Because skin is not sterile, your skin needs to be as free of germs as possible.  You can reduce the number of germs on your skin by washing with CHG (chlorahexidine gluconate) soap before surgery.  CHG is an antiseptic cleaner which kills germs and bonds with the skin to continue killing germs even after washing. Please DO NOT use if you have an allergy to CHG or antibacterial soaps.  If your skin becomes reddened/irritated stop using the CHG and inform your nurse when you arrive at Short Stay. Do not shave (including legs and underarms) for at least 48 hours prior to the first CHG shower.  You may shave your face/neck.  Please follow these instructions carefully:  1.  Shower with CHG Soap the night before surgery and the  morning of surgery.  2.  If you choose to wash your hair, wash your hair first as usual with your normal  shampoo.  3.  After you shampoo, rinse your hair and body thoroughly to remove the shampoo.  4.  Use CHG as you would any other liquid soap.  You can apply chg directly to the skin and wash.  Gently with a scrungie or clean washcloth.  5.  Apply the CHG Soap to your body ONLY FROM THE NECK DOWN.   Do   not use on face/ open                           Wound or open sores. Avoid contact with eyes, ears mouth and   genitals (private parts).                       Wash face,  Genitals (private parts) with your normal soap.             6.  Wash thoroughly, paying special attention to the area where your    surgery  will be performed.  7.  Thoroughly rinse your body with warm water from the neck down.  8.  DO NOT shower/wash with your normal soap after using and rinsing off the CHG Soap.                9.  Pat yourself dry with a clean towel.            10.  Wear clean pajamas.            11.  Place clean sheets on  your bed the night of your first shower and do not  sleep with pets. Day of Surgery : Do not apply any lotions/deodorants the morning of surgery.  Please wear clean clothes to the hospital/surgery center.  FAILURE TO FOLLOW THESE INSTRUCTIONS MAY RESULT IN THE CANCELLATION OF YOUR SURGERY  PATIENT SIGNATURE_________________________________  NURSE SIGNATURE__________________________________  ________________________________________________________________________   Adam Phenix  An incentive spirometer is a tool that can help keep your lungs clear and active. This tool measures how well you are filling your lungs with each breath. Taking long deep breaths may help reverse or decrease the chance of developing breathing (pulmonary) problems (especially infection) following: A long period of time when you are unable to move or be active. BEFORE THE PROCEDURE  If the spirometer includes an indicator to show your best effort, your nurse or respiratory therapist will set it to a desired goal. If possible, sit up straight or lean slightly forward. Try not to slouch. Hold the incentive spirometer in an upright position. INSTRUCTIONS FOR USE  Sit on the edge of your bed if possible, or sit up as far as you can in bed or on a chair. Hold the incentive spirometer in an upright position. Breathe out normally. Place the mouthpiece in your mouth and seal your lips tightly around it. Breathe in slowly and as deeply as possible, raising the piston or the ball toward the top of the column. Hold your breath for 3-5 seconds or for as long as possible. Allow the piston or ball to fall to the bottom of the column. Remove the mouthpiece from your mouth and breathe out normally. Rest for a few seconds and repeat Steps 1 through 7 at least 10 times every 1-2 hours when you are awake. Take your time and take a few normal breaths between deep breaths. The spirometer may include an indicator to show your  best effort. Use the indicator as a goal to work toward during each repetition. After each set of 10 deep breaths, practice coughing to be sure  your lungs are clear. If you have an incision (the cut made at the time of surgery), support your incision when coughing by placing a pillow or rolled up towels firmly against it. Once you are able to get out of bed, walk around indoors and cough well. You may stop using the incentive spirometer when instructed by your caregiver.  RISKS AND COMPLICATIONS Take your time so you do not get dizzy or light-headed. If you are in pain, you may need to take or ask for pain medication before doing incentive spirometry. It is harder to take a deep breath if you are having pain. AFTER USE Rest and breathe slowly and easily. It can be helpful to keep track of a log of your progress. Your caregiver can provide you with a simple table to help with this. If you are using the spirometer at home, follow these instructions: [place] IF:  You are having difficultly using the spirometer. You have trouble using the spirometer as often as instructed. Your pain medication is not giving enough relief while using the spirometer. You develop fever of 100.5 F (38.1 C) or higher. SEEK IMMEDIATE MEDICAL CARE IF:  You cough up bloody sputum that had not been present before. You develop fever of 102 F (38.9 C) or greater. You develop worsening pain at or near the incision site. MAKE SURE YOU:  Understand these instructions. Will watch your condition. Will get help right away if you are not doing well or get worse. Document Released: 05/22/2006 Document Revised: 04/03/2011 Document Reviewed: 07/23/2006 Exit[employer] Patient Information 2014 Exit[employer], [place].   ________________________________________________________________________    WHAT IS A BLOOD TRANSFUSION? Blood Transfusion Information  A transfusion is the replacement of blood or some of its parts. Blood is  made up of multiple cells which provide different functions. Red blood cells carry oxygen and are used for blood loss replacement. White blood cells fight against infection. Platelets control bleeding. Plasma helps clot blood. Other blood products are available for specialized needs, such as hemophilia or other clotting disorders. BEFORE THE TRANSFUSION  Who gives blood for transfusions?  Healthy volunteers who are fully evaluated to make sure their blood is safe. This is blood bank blood. Transfusion therapy is the safest it has ever been in the practice of medicine. Before blood is taken from a donor, a complete history is taken to make sure that person has no history of diseases nor engages in risky social behavior (examples are intravenous drug use or sexual activity with multiple partners). The donor's travel history is screened to minimize risk of transmitting infections, such as malaria. The donated blood is tested for signs of infectious diseases, such as HIV and hepatitis. The blood is then tested to be sure it is compatible with you in order to minimize the chance of a transfusion reaction. If you or a relative donates blood, this is often done in anticipation of surgery and is not appropriate for emergency situations. It takes many days to process the donated blood. RISKS AND COMPLICATIONS Although transfusion therapy is very safe and saves many lives, the main dangers of transfusion include:  Getting an infectious disease. Developing a transfusion reaction. This is an allergic reaction to something in the blood you were given. Every precaution is taken to prevent this. The decision to have a blood transfusion has been considered carefully by your caregiver before blood is given. Blood is not given unless the benefits outweigh the risks. AFTER THE TRANSFUSION Right after receiving a  blood transfusion, you will usually feel much better and more energetic. This is especially true if your red  blood cells have gotten low (anemic). The transfusion raises the level of the red blood cells which carry oxygen, and this usually causes an energy increase. The nurse administering the transfusion will monitor you carefully for complications. HOME CARE INSTRUCTIONS  No special instructions are needed after a transfusion. You may find your energy is better. Speak with your caregiver about any limitations on activity for underlying diseases you may have. SEEK MEDICAL CARE IF:  Your condition is not improving after your transfusion. You develop redness or irritation at the intravenous (IV) site. SEEK IMMEDIATE MEDICAL CARE IF:  Any of the following symptoms occur over the next 12 hours: Shaking chills. You have a temperature by mouth above 102 F (38.9 C), not controlled by medicine. Chest, back, or muscle pain. People around you feel you are not acting correctly or are confused. Shortness of breath or difficulty breathing. Dizziness and fainting. You get a rash or develop hives. You have a decrease in urine output. Your urine turns a dark color or changes to pink, red, or brown. Any of the following symptoms occur over the next 10 days: You have a temperature by mouth above 102 F (38.9 C), not controlled by medicine. Shortness of breath. Weakness after normal activity. The white part of the eye turns yellow (jaundice). You have a decrease in the amount of urine or are urinating less often. Your urine turns a dark color or changes to pink, red, or brown. Document Released: 01/07/2000 Document Revised: 04/03/2011 Document Reviewed: 08/26/2007 [facility] Patient Information 2014 [place], [place].  _______________________________________________________________________

## 2020-09-15 NOTE — Telephone Encounter (Signed)
Call placed to patient.   Recommendations given as follows: Flu seasons are active Sept- April. It is too soon for a flu shot.  Shingrix is available from pharmacy.  Samples are not available at this time.  Contact Linda, referral coordinator for more information [phone].

## 2020-09-15 NOTE — Progress Notes (Addendum)
COVID swab appointment: 09/22/20  COVID Vaccine Completed: yes x2 Date COVID Vaccine completed: 06/09/19, 06/30/19 Has received booster: COVID vaccine manufacturer: Pfizer      Date of COVID positive in last 90 days: No  PCP - Jenna Luo, MD Cardiologist - Daneen Schick, MD  Chest x-ray - 08/31/20 Epic EKG - 09/01/20 Epic Stress Test - long time ago per pt ECHO - longtime per pt Cardiac Cath - N/a Pacemaker/ICD device last checked: n/a Spinal Cord Stimulator: N/a  Sleep Study - N/a CPAP -   Fasting Blood Sugar - N/a Checks Blood Sugar _____ times a day  Blood Thinner Instructions: N/a Aspirin Instructions: Last Dose:  Activity level: Can go up a flight of stairs and perform activities of daily living without stopping and without symptoms of chest pain or shortness of breath. Slow and not many. SOB due to COPD     Anesthesia review: COPD, Aortic sclerosis, PSVT, Esophageal stricture, oxygen 2L PRN  Patient denies shortness of breath, fever, cough and chest pain at PAT appointment   Patient verbalized understanding of instructions that were given to them at the PAT appointment. Patient was also instructed that they will need to review over the PAT instructions again at home before surgery.

## 2020-09-16 ENCOUNTER — Other Ambulatory Visit: Payer: Self-pay

## 2020-09-16 ENCOUNTER — Encounter (HOSPITAL_COMMUNITY)
Admission: RE | Admit: 2020-09-16 | Discharge: 2020-09-16 | Disposition: A | Discharge status: Home / Self Care | Payer: Medicare HMO | Source: Ambulatory Visit | Attending: Orthopaedic Surgery | Admitting: Orthopaedic Surgery

## 2020-09-16 ENCOUNTER — Encounter (HOSPITAL_COMMUNITY): Payer: Self-pay

## 2020-09-16 DIAGNOSIS — Z01812 Encounter for preprocedural laboratory examination: Secondary | ICD-10-CM | POA: Diagnosis not present

## 2020-09-16 DIAGNOSIS — Z87891 Personal history of nicotine dependence: Secondary | ICD-10-CM | POA: Insufficient documentation

## 2020-09-16 DIAGNOSIS — Z79899 Other long term (current) drug therapy: Secondary | ICD-10-CM | POA: Insufficient documentation

## 2020-09-16 DIAGNOSIS — M1611 Unilateral primary osteoarthritis, right hip: Secondary | ICD-10-CM | POA: Insufficient documentation

## 2020-09-16 DIAGNOSIS — J449 Chronic obstructive pulmonary disease, unspecified: Secondary | ICD-10-CM | POA: Insufficient documentation

## 2020-09-16 HISTORY — DX: Essential (primary) hypertension: I10

## 2020-09-16 HISTORY — DX: Dyspnea, unspecified: R06.00

## 2020-09-16 LAB — SURGICAL PCR SCREEN
MRSA, PCR: NEGATIVE
Staphylococcus aureus: NEGATIVE

## 2020-09-17 ENCOUNTER — Telehealth: Payer: Self-pay | Admitting: Interventional Cardiology

## 2020-09-17 MED ORDER — ROSUVASTATIN CALCIUM 5 MG PO TABS: 5.0000 mg | ORAL_TABLET | Freq: Every day | ORAL | 3 refills | Status: DC

## 2020-09-17 MED ORDER — SPIRIVA RESPIMAT 2.5 MCG/ACT IN AERS: 2.0000 | INHALATION_SPRAY | Freq: Every day | RESPIRATORY_TRACT | 0 refills | Status: DC

## 2020-09-17 NOTE — Telephone Encounter (Signed)
*  STAT* If patient is at the pharmacy, call can be transferred to refill team.   1. Which medications need to be refilled? (please list name of each medication and dose if known) Rosuvastatin  2. Which pharmacy/location (including street and city if local pharmacy) is medication to be sent to?[facility] [address] [place]  3. Do they need a 30 day or 90 day supply? Need enough until her appointment which is 02-23-21(the first available appointment)

## 2020-09-17 NOTE — Telephone Encounter (Signed)
Medication refill sent to pharmacy  

## 2020-09-17 NOTE — Progress Notes (Signed)
Anesthesia Chart Review   Case: [id number] Date/Time: 09/24/20 1045   Procedure: RIGHT TOTAL HIP ARTHROPLASTY ANTERIOR APPROACH (Right: Hip) - Needs RNFA   Anesthesia type: Choice   Pre-op diagnosis: right hip osteoarthritis   Location: [facility]   Surgeons: Mcarthur Rossetti, MD       DISCUSSION:75 y.o. former smoker with h/o HTN, COPD, MVP, right hip OA scheduled for above procedure 09/24/20 with Dr. Jean Rosenthal.   Pt seen by pulmonology 09/02/20. Stable at this visit.  Pt uses 2L O2 prn.   Stable at PAT visit 09/16/2020.  Evaluate DOS.  VS: BP (!) 145/79   Pulse 66   Temp 36.9 C (Oral)   Resp 16   Ht 5' 5.5" (1.664 m)   Wt 73.7 kg   SpO2 99%   BMI 26.63 kg/m   PROVIDERS: [name], MD is PCP   Chesley Mires, MD is Pulmonologist LABS: Labs reviewed: Acceptable for surgery. (all labs ordered are listed, but only abnormal results are displayed)  Labs Reviewed  SURGICAL PCR SCREEN  TYPE AND SCREEN     IMAGES:   EKG: 09/01/2020 Rate 73 bpm  Sinus rhythm with occasional premature ventricular complexes Anteroseptal infarct, age undetermined  CV:  Past Medical History:  Diagnosis Date   Arthritis    COPD (chronic obstructive pulmonary disease) (HCC)    Dyspnea    Elevated lipids    Generalized headaches    History of pneumonia    Hyperlipidemia    Hypertension    Hypothyroidism    MVP (mitral valve prolapse)    palpitations   Osteopenia    Seasonal allergies    Sinusitis    Tubular adenoma of colon     Past Surgical History:  Procedure Laterality Date   CATARACT EXTRACTION Left    COLONOSCOPY WITH PROPOFOL N/A 01/06/2016   Procedure: COLONOSCOPY WITH PROPOFOL;  Surgeon: Doran Stabler, MD;  Location: [facility];  Service: Gastroenterology;  Laterality: N/A;   DILATION AND CURETTAGE OF UTERUS     THYROIDECTOMY     TONSILLECTOMY      MEDICATIONS:  albuterol (PROVENTIL) (2.5 MG/3ML) 0.083% nebulizer solution    ALPRAZolam (XANAX) 0.5 MG tablet   Budeson-Glycopyrrol-Formoterol (BREZTRI AEROSPHERE) 160-9-4.8 MCG/ACT AERO   budesonide-formoterol (SYMBICORT) 160-4.5 MCG/ACT inhaler   diclofenac (VOLTAREN) 75 MG EC tablet   fluticasone (FLONASE) 50 MCG/ACT nasal spray   HYDROcodone bit-homatropine (HYCODAN) 5-1.5 MG/5ML syrup   HYDROcodone-acetaminophen (NORCO) 5-325 MG tablet   hydrOXYzine (ATARAX/VISTARIL) 25 MG tablet   levothyroxine (SYNTHROID) 50 MCG tablet   meclizine (ANTIVERT) 25 MG tablet   OVER THE COUNTER MEDICATION   Propylene Glycol-Glycerin 0.6-0.6 % SOLN   rosuvastatin (CRESTOR) 5 MG tablet   sodium chloride (OCEAN) 0.65 % SOLN nasal spray   Tiotropium Bromide Monohydrate (SPIRIVA RESPIMAT) 2.5 MCG/ACT AERS   Tiotropium Bromide Monohydrate (SPIRIVA RESPIMAT) 2.5 MCG/ACT AERS   tiZANidine (ZANAFLEX) 4 MG tablet   verapamil (CALAN-SR) 120 MG CR tablet   No current facility-administered medications for this encounter.    Konrad Felix Ward, PA-C [facility] [phone]

## 2020-09-17 NOTE — Telephone Encounter (Signed)
Pt calling back regarding samples of Spiriva.  [phone].

## 2020-09-17 NOTE — Telephone Encounter (Signed)
Spoke with the pt  She is getting ready to have surgery and has a high copay for this so can not afford her spiriva this month  I left her 2 samples of this up front in [place] and she is already aware of new office location

## 2020-09-19 DIAGNOSIS — J449 Chronic obstructive pulmonary disease, unspecified: Secondary | ICD-10-CM | POA: Diagnosis not present

## 2020-09-22 ENCOUNTER — Other Ambulatory Visit: Payer: Self-pay | Admitting: Orthopaedic Surgery

## 2020-09-22 ENCOUNTER — Telehealth: Payer: Self-pay | Admitting: *Deleted

## 2020-09-22 LAB — SARS CORONAVIRUS 2 (TAT 6-24 HRS): SARS Coronavirus 2: NEGATIVE

## 2020-09-22 NOTE — Telephone Encounter (Signed)
Ortho bundle pre-op call completed. 

## 2020-09-22 NOTE — Care Plan (Signed)
[facility] RNCM call to patient to discuss her upcoming Right total hip arthroplasty with Dr. Ninfa Linden. She is an Ortho bundle patient through [facility] and is agreeable to case management. She will be going to her daughter's home in [place] after discharge (Dtr. Bonner Puna). Address: [address] [place], [place] [address]. She will need a FWW and 3in1/BSC. These have already been ordered through [facility] for delivery to hospital prior to discharge. Anticipate HHPT will be needed. Referral made to [facility] after choice provided. New address provided to liaison. Reviewed all post op care instructions. Copy mailed to patient's home. Will continue to follow for needs.

## 2020-09-23 ENCOUNTER — Encounter (HOSPITAL_COMMUNITY): Payer: Self-pay | Admitting: Orthopaedic Surgery

## 2020-09-23 DIAGNOSIS — M1611 Unilateral primary osteoarthritis, right hip: Secondary | ICD-10-CM

## 2020-09-23 NOTE — H&P (Signed)
TOTAL HIP ADMISSION H&P  Patient is admitted for right total hip arthroplasty.  Subjective:  Chief Complaint: right hip pain  HPI: Mary Blevins, 75 y.o. female, has a history of pain and functional disability in the right hip(s) due to arthritis and patient has failed non-surgical conservative treatments for greater than 12 weeks to include NSAID's and/or analgesics, corticosteriod injections, flexibility and strengthening excercises, use of assistive devices, weight reduction as appropriate, and activity modification.  Onset of symptoms was abrupt starting 1 years ago with gradually worsening course since that time.The patient noted no past surgery on the right hip(s).  Patient currently rates pain in the right hip at 10 out of 10 with activity. Patient has night pain, worsening of pain with activity and weight bearing, trendelenberg gait, pain that interfers with activities of daily living, and pain with passive range of motion. Patient has evidence of subchondral sclerosis, periarticular osteophytes, and joint space narrowing by imaging studies. This condition presents safety issues increasing the risk of falls.  There is no current active infection.  Patient Active Problem List   Diagnosis Date Noted   Unilateral primary osteoarthritis, right hip 09/23/2020   Leg cramping 01/07/2020   GAD (generalized anxiety disorder) 12/20/2016   Chronic insomnia 12/20/2016   Aortic atherosclerosis ([place]) 06/09/2016   Benign neoplasm of ascending colon    Benign neoplasm of transverse colon    Benign neoplasm of sigmoid colon    Diverticulosis of sigmoid colon    Grade I internal hemorrhoids    PSVT (paroxysmal supraventricular tachycardia) ([place]) 02/17/2015   Back pain 07/22/2012   Acute pain of left hip 07/22/2012   Stricture and stenosis of esophagus 09/18/2011   Hx of colonic polyps 09/18/2011   COPD (chronic obstructive pulmonary disease) ([place]) 07/14/2010   Generalized headaches  07/14/2010   Osteopenia 07/14/2010   Hypothyroid 07/14/2010   Elevated lipids 07/14/2010   Past Medical History:  Diagnosis Date   Arthritis    COPD (chronic obstructive pulmonary disease) (HCC)    Dyspnea    Elevated lipids    Generalized headaches    History of pneumonia    Hyperlipidemia    Hypertension    Hypothyroidism    MVP (mitral valve prolapse)    palpitations   Osteopenia    Seasonal allergies    Sinusitis    Tubular adenoma of colon     Past Surgical History:  Procedure Laterality Date   CATARACT EXTRACTION Left    COLONOSCOPY WITH PROPOFOL N/A 01/06/2016   Procedure: COLONOSCOPY WITH PROPOFOL;  Surgeon: Doran Stabler, MD;  Location: [facility];  Service: Gastroenterology;  Laterality: N/A;   DILATION AND CURETTAGE OF UTERUS     THYROIDECTOMY     TONSILLECTOMY      No current facility-administered medications for this encounter.   Current Outpatient Medications  Medication Sig Dispense Refill Last Dose   albuterol (PROVENTIL) (2.5 MG/3ML) 0.083% nebulizer solution Take 3 mLs (2.5 mg total) by nebulization every 6 (six) hours as needed for wheezing or shortness of breath. (Patient taking differently: Take 2.5 mg by nebulization daily.) 360 mL 3    ALPRAZolam (XANAX) 0.5 MG tablet Take 1 tablet (0.5 mg total) by mouth 3 (three) times daily as needed. (Patient taking differently: Take 0.5 mg by mouth 3 (three) times daily as needed for anxiety.) 30 tablet 0    budesonide-formoterol (SYMBICORT) 160-4.5 MCG/ACT inhaler Inhale 2 puffs into the lungs 2 (two) times daily.      diclofenac (  VOLTAREN) 75 MG EC tablet Take 1 tablet (75 mg total) by mouth 2 (two) times daily as needed (arthritis). 60 tablet 1    HYDROcodone bit-homatropine (HYCODAN) 5-1.5 MG/5ML syrup Take 5 mLs by mouth every 8 (eight) hours as needed for cough. 120 mL 0    HYDROcodone-acetaminophen (NORCO) 5-325 MG tablet Take 1 tablet by mouth 3 (three) times daily as needed for moderate pain. 30  tablet 0    hydrOXYzine (ATARAX/VISTARIL) 25 MG tablet Take 1 tablet (25 mg total) by mouth 3 (three) times daily as needed for itching. 30 tablet 6    levothyroxine (SYNTHROID) 50 MCG tablet TAKE 1 TABLET BY MOUTH DAILY BEFORE BREAKFAST 90 tablet 3    meclizine (ANTIVERT) 25 MG tablet Take 1 tablet (25 mg total) by mouth 3 (three) times daily as needed for dizziness. 30 tablet 0    OVER THE COUNTER MEDICATION Apply 1 application topically 2 (two) times daily. Stop pain roll-on      Propylene Glycol-Glycerin 0.6-0.6 % SOLN Place 1 drop into both eyes 2 (two) times daily. Artifical tears      sodium chloride (OCEAN) 0.65 % SOLN nasal spray Place 1 spray into both nostrils daily.      Tiotropium Bromide Monohydrate (SPIRIVA RESPIMAT) 2.5 MCG/ACT AERS Inhale 1 puff into the lungs 2 (two) times daily.      tiZANidine (ZANAFLEX) 4 MG tablet TAKE 1 TABLET BY MOUTH EVERY 6 HOURS AS NEEDED FOR MUSCLE SPASMS. 30 tablet 0    verapamil (CALAN-SR) 120 MG CR tablet Take 1 tablet (120 mg total) by mouth daily. 90 tablet 3    Budeson-Glycopyrrol-Formoterol (BREZTRI AEROSPHERE) 160-9-4.8 MCG/ACT AERO Inhale 2 puffs into the lungs in the morning and at bedtime. (Patient not taking: Reported on 09/14/2020) 10.7 g 0 Not Taking   fluticasone (FLONASE) 50 MCG/ACT nasal spray SPRAY 2 SPRAYS INTO EACH NOSTRIL EVERY DAY (Patient not taking: Reported on 09/14/2020) 16 g 2 Not Taking   rosuvastatin (CRESTOR) 5 MG tablet Take 1 tablet (5 mg total) by mouth daily. 90 tablet 3    Tiotropium Bromide Monohydrate (SPIRIVA RESPIMAT) 2.5 MCG/ACT AERS Inhale 2 puffs into the lungs daily. 4 g 0    Allergies  Allergen Reactions   Meloxicam Other (See Comments)    BAD DREAMS, SAD THOUGHTS    Social History   Tobacco Use   Smoking status: Former    Types: Cigarettes    Quit date: 07/24/2005    Years since quitting: 15.1   Smokeless tobacco: Never  Substance Use Topics   Alcohol use: Yes    Comment: rare    Family History   Problem Relation Age of Onset   Breast cancer Maternal Aunt    Heart attack Maternal Grandmother    Heart attack Mother    Heart disease Mother    Dementia Mother    Diabetes Brother    Prostate cancer Maternal Grandfather    Healthy Father    Healthy Sister    Heart attack Other        NIECE   Colon cancer Neg Hx    Esophageal cancer Neg Hx    Rectal cancer Neg Hx    Stomach cancer Neg Hx      Review of Systems  Musculoskeletal:  Positive for gait problem.  All other systems reviewed and are negative.  Objective:  Physical Exam Vitals reviewed.  Constitutional:      Appearance: Normal appearance.  HENT:     Head: Normocephalic  and atraumatic.  Eyes:     Extraocular Movements: Extraocular movements intact.     Pupils: Pupils are equal, round, and reactive to light.  Cardiovascular:     Rate and Rhythm: Normal rate.     Pulses: Normal pulses.  Pulmonary:     Effort: Pulmonary effort is normal.  Abdominal:     Palpations: Abdomen is soft.  Musculoskeletal:     Cervical back: Normal range of motion and neck supple.     Right hip: Tenderness and bony tenderness present. Decreased range of motion. Decreased strength.  Neurological:     Mental Status: She is alert and oriented to person, place, and time.  Psychiatric:        Behavior: Behavior normal.    Vital signs in last 24 hours:    Labs:   Estimated body mass index is 26.63 kg/m as calculated from the following:   Height as of 09/16/20: 5' 5.5" (1.664 m).   Weight as of 09/16/20: 73.7 kg.   Imaging Review Plain radiographs demonstrate severe degenerative joint disease of the right hip(s). The bone quality appears to be good for age and reported activity level.      Assessment/Plan:  End stage arthritis, right hip(s)  The patient history, physical examination, clinical judgement of the provider and imaging studies are consistent with end stage degenerative joint disease of the right hip(s) and  total hip arthroplasty is deemed medically necessary. The treatment options including medical management, injection therapy, arthroscopy and arthroplasty were discussed at length. The risks and benefits of total hip arthroplasty were presented and reviewed. The risks due to aseptic loosening, infection, stiffness, dislocation/subluxation,  thromboembolic complications and other imponderables were discussed.  The patient acknowledged the explanation, agreed to proceed with the plan and consent was signed. Patient is being admitted for inpatient treatment for surgery, pain control, PT, OT, prophylactic antibiotics, VTE prophylaxis, progressive ambulation and ADL's and discharge planning.The patient is planning to be discharged home with home health services

## 2020-09-24 ENCOUNTER — Encounter (HOSPITAL_COMMUNITY): Payer: Self-pay | Admitting: Orthopaedic Surgery

## 2020-09-24 ENCOUNTER — Ambulatory Visit (HOSPITAL_COMMUNITY): Payer: Medicare HMO

## 2020-09-24 ENCOUNTER — Observation Stay (HOSPITAL_COMMUNITY)
Admission: RE | Admit: 2020-09-24 | Discharge: 2020-09-27 | Disposition: A | Discharge status: Home / Self Care | Payer: Medicare HMO | Attending: Orthopaedic Surgery | Admitting: Orthopaedic Surgery

## 2020-09-24 ENCOUNTER — Observation Stay (HOSPITAL_COMMUNITY): Payer: Medicare HMO

## 2020-09-24 ENCOUNTER — Other Ambulatory Visit: Payer: Self-pay

## 2020-09-24 ENCOUNTER — Encounter (HOSPITAL_COMMUNITY)
Admission: RE | Disposition: A | Discharge status: Home / Self Care | Payer: Self-pay | Source: Home / Self Care | Attending: Orthopaedic Surgery

## 2020-09-24 ENCOUNTER — Ambulatory Visit (HOSPITAL_COMMUNITY): Payer: Medicare HMO | Admitting: Anesthesiology

## 2020-09-24 ENCOUNTER — Ambulatory Visit (HOSPITAL_COMMUNITY): Payer: Medicare HMO | Admitting: Physician Assistant

## 2020-09-24 DIAGNOSIS — F411 Generalized anxiety disorder: Secondary | ICD-10-CM | POA: Diagnosis not present

## 2020-09-24 DIAGNOSIS — I10 Essential (primary) hypertension: Secondary | ICD-10-CM | POA: Insufficient documentation

## 2020-09-24 DIAGNOSIS — Z87891 Personal history of nicotine dependence: Secondary | ICD-10-CM | POA: Diagnosis not present

## 2020-09-24 DIAGNOSIS — Z85038 Personal history of other malignant neoplasm of large intestine: Secondary | ICD-10-CM | POA: Diagnosis not present

## 2020-09-24 DIAGNOSIS — Z79899 Other long term (current) drug therapy: Secondary | ICD-10-CM | POA: Diagnosis not present

## 2020-09-24 DIAGNOSIS — R600 Localized edema: Secondary | ICD-10-CM | POA: Diagnosis not present

## 2020-09-24 DIAGNOSIS — Z96641 Presence of right artificial hip joint: Secondary | ICD-10-CM | POA: Diagnosis not present

## 2020-09-24 DIAGNOSIS — J449 Chronic obstructive pulmonary disease, unspecified: Secondary | ICD-10-CM | POA: Insufficient documentation

## 2020-09-24 DIAGNOSIS — E039 Hypothyroidism, unspecified: Secondary | ICD-10-CM | POA: Diagnosis not present

## 2020-09-24 DIAGNOSIS — M1611 Unilateral primary osteoarthritis, right hip: Secondary | ICD-10-CM | POA: Diagnosis not present

## 2020-09-24 DIAGNOSIS — Z471 Aftercare following joint replacement surgery: Secondary | ICD-10-CM | POA: Diagnosis not present

## 2020-09-24 DIAGNOSIS — R34 Anuria and oliguria: Secondary | ICD-10-CM | POA: Diagnosis not present

## 2020-09-24 DIAGNOSIS — Z419 Encounter for procedure for purposes other than remedying health state, unspecified: Secondary | ICD-10-CM

## 2020-09-24 DIAGNOSIS — I341 Nonrheumatic mitral (valve) prolapse: Secondary | ICD-10-CM | POA: Diagnosis not present

## 2020-09-24 HISTORY — PX: TOTAL HIP ARTHROPLASTY: SHX124

## 2020-09-24 LAB — TYPE AND SCREEN
ABO/RH(D): O POS
Antibody Screen: NEGATIVE

## 2020-09-24 LAB — ABO/RH: ABO/RH(D): O POS

## 2020-09-24 SURGERY — ARTHROPLASTY, HIP, TOTAL, ANTERIOR APPROACH
Anesthesia: Spinal | Site: Hip | Laterality: Right

## 2020-09-24 MED ORDER — MECLIZINE HCL 25 MG PO TABS: 25.0000 mg | ORAL_TABLET | Freq: Three times a day (TID) | ORAL | Status: DC | PRN

## 2020-09-24 MED ORDER — ONDANSETRON HCL 4 MG/2ML IJ SOLN
INTRAMUSCULAR | Status: DC | PRN
  Administered 2020-09-24: 4 mg via INTRAVENOUS

## 2020-09-24 MED ORDER — ALUM & MAG HYDROXIDE-SIMETH 200-200-20 MG/5ML PO SUSP: 30.0000 mL | ORAL | Status: DC | PRN

## 2020-09-24 MED ORDER — METOCLOPRAMIDE HCL 5 MG/ML IJ SOLN: 5.0000 mg | Freq: Three times a day (TID) | INTRAMUSCULAR | Status: DC | PRN

## 2020-09-24 MED ORDER — LACTATED RINGERS IV SOLN: INTRAVENOUS | Status: DC

## 2020-09-24 MED ORDER — ASPIRIN 81 MG PO CHEW
81.0000 mg | CHEWABLE_TABLET | Freq: Two times a day (BID) | ORAL | Status: DC
  Administered 2020-09-24 – 2020-09-27 (×6): 81 mg via ORAL
  Filled 2020-09-24 (×6): qty 1

## 2020-09-24 MED ORDER — LEVOTHYROXINE SODIUM 50 MCG PO TABS
50.0000 ug | ORAL_TABLET | Freq: Every day | ORAL | Status: DC
  Administered 2020-09-25 – 2020-09-27 (×3): 50 ug via ORAL
  Filled 2020-09-24 (×3): qty 1

## 2020-09-24 MED ORDER — MOMETASONE FURO-FORMOTEROL FUM 200-5 MCG/ACT IN AERO
2.0000 | INHALATION_SPRAY | Freq: Two times a day (BID) | RESPIRATORY_TRACT | Status: DC
Start: 2020-09-24 — End: 2020-09-27
  Administered 2020-09-25 – 2020-09-27 (×4): 2 via RESPIRATORY_TRACT
  Filled 2020-09-24: qty 8.8

## 2020-09-24 MED ORDER — ACETAMINOPHEN 500 MG PO TABS
1000.0000 mg | ORAL_TABLET | Freq: Once | ORAL | Status: AC
  Administered 2020-09-24: 1000 mg via ORAL
  Filled 2020-09-24: qty 2

## 2020-09-24 MED ORDER — METHOCARBAMOL 500 MG IVPB - SIMPLE MED
INTRAVENOUS | Status: AC
  Filled 2020-09-24: qty 50

## 2020-09-24 MED ORDER — PHENOL 1.4 % MT LIQD: 1.0000 | OROMUCOSAL | Status: DC | PRN

## 2020-09-24 MED ORDER — METOCLOPRAMIDE HCL 5 MG PO TABS: 5.0000 mg | ORAL_TABLET | Freq: Three times a day (TID) | ORAL | Status: DC | PRN

## 2020-09-24 MED ORDER — DIPHENHYDRAMINE HCL 12.5 MG/5ML PO ELIX
12.5000 mg | ORAL_SOLUTION | ORAL | Status: DC | PRN
  Administered 2020-09-25: 25 mg via ORAL
  Filled 2020-09-24: qty 10

## 2020-09-24 MED ORDER — ALBUTEROL SULFATE (2.5 MG/3ML) 0.083% IN NEBU
2.5000 mg | INHALATION_SOLUTION | Freq: Four times a day (QID) | RESPIRATORY_TRACT | Status: DC | PRN
  Administered 2020-09-25 – 2020-09-27 (×3): 2.5 mg via RESPIRATORY_TRACT
  Filled 2020-09-24 (×5): qty 3

## 2020-09-24 MED ORDER — ORAL CARE MOUTH RINSE
15.0000 mL | Freq: Once | OROMUCOSAL | Status: AC
  Administered 2020-09-24: 15 mL via OROMUCOSAL

## 2020-09-24 MED ORDER — BUPIVACAINE IN DEXTROSE 0.75-8.25 % IT SOLN
INTRATHECAL | Status: DC | PRN
  Administered 2020-09-24: 1.6 mL via INTRATHECAL

## 2020-09-24 MED ORDER — VERAPAMIL HCL ER 120 MG PO TBCR
120.0000 mg | EXTENDED_RELEASE_TABLET | Freq: Every day | ORAL | Status: DC
  Administered 2020-09-25 – 2020-09-27 (×3): 120 mg via ORAL
  Filled 2020-09-24 (×3): qty 1

## 2020-09-24 MED ORDER — POLYVINYL ALCOHOL 1.4 % OP SOLN
1.0000 [drp] | Freq: Two times a day (BID) | OPHTHALMIC | Status: DC
  Administered 2020-09-24 – 2020-09-27 (×6): 1 [drp] via OPHTHALMIC
  Filled 2020-09-24: qty 15

## 2020-09-24 MED ORDER — TRANEXAMIC ACID-NACL 1000-0.7 MG/100ML-% IV SOLN
1000.0000 mg | INTRAVENOUS | Status: AC
  Administered 2020-09-24: 1000 mg via INTRAVENOUS
  Filled 2020-09-24: qty 100

## 2020-09-24 MED ORDER — PROPOFOL 10 MG/ML IV BOLUS
INTRAVENOUS | Status: AC
  Filled 2020-09-24: qty 20

## 2020-09-24 MED ORDER — EPHEDRINE 5 MG/ML INJ
INTRAVENOUS | Status: AC
  Filled 2020-09-24: qty 5

## 2020-09-24 MED ORDER — POVIDONE-IODINE 10 % EX SWAB
2.0000 "application " | Freq: Once | CUTANEOUS | Status: AC
  Administered 2020-09-24: 2 via TOPICAL

## 2020-09-24 MED ORDER — MENTHOL 3 MG MT LOZG: 1.0000 | LOZENGE | OROMUCOSAL | Status: DC | PRN

## 2020-09-24 MED ORDER — STERILE WATER FOR IRRIGATION IR SOLN
Status: DC | PRN
  Administered 2020-09-24: 2000 mL

## 2020-09-24 MED ORDER — CEFAZOLIN SODIUM-DEXTROSE 1-4 GM/50ML-% IV SOLN
1.0000 g | Freq: Four times a day (QID) | INTRAVENOUS | Status: AC
Start: 2020-09-24 — End: 2020-09-24
  Administered 2020-09-24 (×2): 1 g via INTRAVENOUS
  Filled 2020-09-24 (×2): qty 50

## 2020-09-24 MED ORDER — PHENYLEPHRINE 40 MCG/ML (10ML) SYRINGE FOR IV PUSH (FOR BLOOD PRESSURE SUPPORT)
PREFILLED_SYRINGE | INTRAVENOUS | Status: DC | PRN
  Administered 2020-09-24: 120 ug via INTRAVENOUS

## 2020-09-24 MED ORDER — PHENYLEPHRINE HCL-NACL 20-0.9 MG/250ML-% IV SOLN
INTRAVENOUS | Status: DC | PRN
  Administered 2020-09-24: 40 ug/min via INTRAVENOUS

## 2020-09-24 MED ORDER — HYDROXYZINE HCL 25 MG PO TABS: 25.0000 mg | ORAL_TABLET | Freq: Three times a day (TID) | ORAL | Status: DC | PRN

## 2020-09-24 MED ORDER — ONDANSETRON HCL 4 MG/2ML IJ SOLN: 4.0000 mg | Freq: Once | INTRAMUSCULAR | Status: DC | PRN

## 2020-09-24 MED ORDER — OXYCODONE HCL 5 MG PO TABS
10.0000 mg | ORAL_TABLET | ORAL | Status: DC | PRN
  Administered 2020-09-25 – 2020-09-26 (×2): 15 mg via ORAL
  Filled 2020-09-24 (×2): qty 3

## 2020-09-24 MED ORDER — UMECLIDINIUM BROMIDE 62.5 MCG/INH IN AEPB
1.0000 | INHALATION_SPRAY | Freq: Every day | RESPIRATORY_TRACT | Status: DC
  Administered 2020-09-25 – 2020-09-27 (×3): 1 via RESPIRATORY_TRACT
  Filled 2020-09-24: qty 7

## 2020-09-24 MED ORDER — ROSUVASTATIN CALCIUM 5 MG PO TABS
5.0000 mg | ORAL_TABLET | Freq: Every day | ORAL | Status: DC
  Administered 2020-09-25 – 2020-09-27 (×3): 5 mg via ORAL
  Filled 2020-09-24 (×3): qty 1

## 2020-09-24 MED ORDER — DOCUSATE SODIUM 100 MG PO CAPS
100.0000 mg | ORAL_CAPSULE | Freq: Two times a day (BID) | ORAL | Status: DC
  Administered 2020-09-24 – 2020-09-27 (×6): 100 mg via ORAL
  Filled 2020-09-24 (×6): qty 1

## 2020-09-24 MED ORDER — OXYCODONE HCL 5 MG PO TABS
5.0000 mg | ORAL_TABLET | ORAL | Status: DC | PRN
  Administered 2020-09-24 – 2020-09-27 (×7): 10 mg via ORAL
  Filled 2020-09-24 (×7): qty 2

## 2020-09-24 MED ORDER — ONDANSETRON HCL 4 MG PO TABS: 4.0000 mg | ORAL_TABLET | Freq: Four times a day (QID) | ORAL | Status: DC | PRN

## 2020-09-24 MED ORDER — METHOCARBAMOL 500 MG IVPB - SIMPLE MED
500.0000 mg | Freq: Four times a day (QID) | INTRAVENOUS | Status: DC | PRN
  Administered 2020-09-24: 500 mg via INTRAVENOUS
  Filled 2020-09-24: qty 50

## 2020-09-24 MED ORDER — FENTANYL CITRATE PF 50 MCG/ML IJ SOSY
25.0000 ug | PREFILLED_SYRINGE | INTRAMUSCULAR | Status: DC | PRN
  Administered 2020-09-24: 50 ug via INTRAVENOUS

## 2020-09-24 MED ORDER — SODIUM CHLORIDE 0.9 % IV SOLN: INTRAVENOUS | Status: DC

## 2020-09-24 MED ORDER — PROPOFOL 500 MG/50ML IV EMUL
INTRAVENOUS | Status: DC | PRN
  Administered 2020-09-24: 75 ug/kg/min via INTRAVENOUS

## 2020-09-24 MED ORDER — SODIUM CHLORIDE 0.9 % IR SOLN
Status: DC | PRN
  Administered 2020-09-24: 1000 mL

## 2020-09-24 MED ORDER — CHLORHEXIDINE GLUCONATE 0.12 % MT SOLN: 15.0000 mL | Freq: Once | OROMUCOSAL | Status: AC

## 2020-09-24 MED ORDER — ONDANSETRON HCL 4 MG/2ML IJ SOLN: 4.0000 mg | Freq: Four times a day (QID) | INTRAMUSCULAR | Status: DC | PRN

## 2020-09-24 MED ORDER — CEFAZOLIN SODIUM-DEXTROSE 2-4 GM/100ML-% IV SOLN
2.0000 g | INTRAVENOUS | Status: AC
  Administered 2020-09-24: 2 g via INTRAVENOUS
  Filled 2020-09-24: qty 100

## 2020-09-24 MED ORDER — DEXAMETHASONE SODIUM PHOSPHATE 10 MG/ML IJ SOLN
INTRAMUSCULAR | Status: DC | PRN
  Administered 2020-09-24: 5 mg via INTRAVENOUS

## 2020-09-24 MED ORDER — 0.9 % SODIUM CHLORIDE (POUR BTL) OPTIME
TOPICAL | Status: DC | PRN
  Administered 2020-09-24: 1000 mL

## 2020-09-24 MED ORDER — EPHEDRINE SULFATE-NACL 50-0.9 MG/10ML-% IV SOSY
PREFILLED_SYRINGE | INTRAVENOUS | Status: DC | PRN
  Administered 2020-09-24: 5 mg via INTRAVENOUS

## 2020-09-24 MED ORDER — CHLORHEXIDINE GLUCONATE CLOTH 2 % EX PADS: 6.0000 | MEDICATED_PAD | Freq: Every day | CUTANEOUS | Status: DC

## 2020-09-24 MED ORDER — ACETAMINOPHEN 325 MG PO TABS
325.0000 mg | ORAL_TABLET | Freq: Four times a day (QID) | ORAL | Status: DC | PRN
Start: 2020-09-25 — End: 2020-09-26
  Administered 2020-09-25 – 2020-09-26 (×3): 650 mg via ORAL
  Filled 2020-09-24 (×3): qty 2

## 2020-09-24 MED ORDER — TIOTROPIUM BROMIDE MONOHYDRATE 2.5 MCG/ACT IN AERS: 2.0000 | INHALATION_SPRAY | Freq: Every day | RESPIRATORY_TRACT | Status: DC

## 2020-09-24 MED ORDER — HYDROMORPHONE HCL 1 MG/ML IJ SOLN
0.5000 mg | INTRAMUSCULAR | Status: DC | PRN
Start: 2020-09-24 — End: 2020-09-26
  Administered 2020-09-24: 1 mg via INTRAVENOUS
  Filled 2020-09-24: qty 1

## 2020-09-24 MED ORDER — PROPOFOL 10 MG/ML IV BOLUS
INTRAVENOUS | Status: DC | PRN
  Administered 2020-09-24: 20 mg via INTRAVENOUS

## 2020-09-24 MED ORDER — METHOCARBAMOL 500 MG PO TABS
500.0000 mg | ORAL_TABLET | Freq: Four times a day (QID) | ORAL | Status: DC | PRN
  Administered 2020-09-24 – 2020-09-25 (×4): 500 mg via ORAL
  Filled 2020-09-24 (×4): qty 1

## 2020-09-24 MED ORDER — PANTOPRAZOLE SODIUM 40 MG PO TBEC
40.0000 mg | DELAYED_RELEASE_TABLET | Freq: Every day | ORAL | Status: DC
  Administered 2020-09-24 – 2020-09-27 (×4): 40 mg via ORAL
  Filled 2020-09-24 (×4): qty 1

## 2020-09-24 MED ORDER — FENTANYL CITRATE PF 50 MCG/ML IJ SOSY
PREFILLED_SYRINGE | INTRAMUSCULAR | Status: AC
  Filled 2020-09-24: qty 2

## 2020-09-24 MED ORDER — SALINE SPRAY 0.65 % NA SOLN
1.0000 | Freq: Every day | NASAL | Status: DC
  Administered 2020-09-25 – 2020-09-27 (×3): 1 via NASAL
  Filled 2020-09-24: qty 44

## 2020-09-24 SURGICAL SUPPLY — 45 items
APL SKNCLS STERI-STRIP NONHPOA (GAUZE/BANDAGES/DRESSINGS)
BAG COUNTER SPONGE SURGICOUNT (BAG) ×2 IMPLANT
BAG SPEC THK2 15X12 ZIP CLS (MISCELLANEOUS)
BAG SPNG CNTER NS LX DISP (BAG) ×1
BAG SURGICOUNT SPONGE COUNTING (BAG) ×1
BAG ZIPLOCK 12X15 (MISCELLANEOUS) IMPLANT
BENZOIN TINCTURE PRP APPL 2/3 (GAUZE/BANDAGES/DRESSINGS) IMPLANT
BLADE SAW SGTL 18X1.27X75 (BLADE) ×2 IMPLANT
BLADE SAW SGTL 18X1.27X75MM (BLADE) ×1
CLOSURE WOUND 1/2 X4 (GAUZE/BANDAGES/DRESSINGS)
COVER PERINEAL POST (MISCELLANEOUS) ×3 IMPLANT
COVER SURGICAL LIGHT HANDLE (MISCELLANEOUS) ×3 IMPLANT
CUP SECTOR GRIPTON 50MM (Cup) ×2 IMPLANT
DRAPE FOOT SWITCH (DRAPES) ×3 IMPLANT
DRAPE STERI IOBAN 125X83 (DRAPES) ×3 IMPLANT
DRAPE U-SHAPE 47X51 STRL (DRAPES) ×6 IMPLANT
DRSG AQUACEL AG ADV 3.5X10 (GAUZE/BANDAGES/DRESSINGS) ×3 IMPLANT
DURAPREP 26ML APPLICATOR (WOUND CARE) ×3 IMPLANT
ELECT REM PT RETURN 15FT ADLT (MISCELLANEOUS) ×3 IMPLANT
GAUZE XEROFORM 1X8 LF (GAUZE/BANDAGES/DRESSINGS) ×3 IMPLANT
GLOVE SRG 8 PF TXTR STRL LF DI (GLOVE) ×2 IMPLANT
GLOVE SURG ENC MOIS LTX SZ7.5 (GLOVE) ×3 IMPLANT
GLOVE SURG NEOPR MICRO LF SZ8 (GLOVE) ×3 IMPLANT
GLOVE SURG UNDER POLY LF SZ8 (GLOVE) ×6
GOWN STRL REUS W/TWL XL LVL3 (GOWN DISPOSABLE) ×6 IMPLANT
HANDPIECE INTERPULSE COAX TIP (DISPOSABLE) ×3
HEAD FEM STD 32X+1 STRL (Hips) ×2 IMPLANT
HOLDER FOLEY CATH W/STRAP (MISCELLANEOUS) ×3 IMPLANT
KIT TURNOVER KIT A (KITS) ×3 IMPLANT
LINER ACETABULAR 32X50 (Liner) ×2 IMPLANT
PACK ANTERIOR HIP CUSTOM (KITS) ×3 IMPLANT
PENCIL SMOKE EVACUATOR (MISCELLANEOUS) IMPLANT
SCREW 6.5MMX25MM (Screw) ×2 IMPLANT
SET HNDPC FAN SPRY TIP SCT (DISPOSABLE) ×1 IMPLANT
STAPLER VISISTAT 35W (STAPLE) IMPLANT
STEM CORAIL KA10 (Stem) ×2 IMPLANT
STRIP CLOSURE SKIN 1/2X4 (GAUZE/BANDAGES/DRESSINGS) IMPLANT
SUT ETHIBOND NAB CT1 #1 30IN (SUTURE) ×3 IMPLANT
SUT ETHILON 2 0 PS N (SUTURE) IMPLANT
SUT MNCRL AB 4-0 PS2 18 (SUTURE) IMPLANT
SUT VIC AB 0 CT1 36 (SUTURE) ×3 IMPLANT
SUT VIC AB 1 CT1 36 (SUTURE) ×3 IMPLANT
SUT VIC AB 2-0 CT1 27 (SUTURE) ×6
SUT VIC AB 2-0 CT1 TAPERPNT 27 (SUTURE) ×2 IMPLANT
TRAY FOLEY MTR SLVR 16FR STAT (SET/KITS/TRAYS/PACK) IMPLANT

## 2020-09-24 NOTE — Brief Op Note (Signed)
09/24/2020  1:28 PM  PATIENT:  Mary Blevins  75 y.o. female  PRE-OPERATIVE DIAGNOSIS:  right hip osteoarthritis  POST-OPERATIVE DIAGNOSIS:  right hip osteoarthritis  PROCEDURE:  Procedure(s) with comments: RIGHT TOTAL HIP ARTHROPLASTY ANTERIOR APPROACH (Right) - Needs RNFA  SURGEON:  Surgeon(s) and Role:    * Mcarthur Rossetti, MD - Primary  ASSISTANTS: Arrie Aran, RNFA   ANESTHESIA:   spinal  EBL:  200 mL   COUNTS:  YES  DICTATION: .Other Dictation: Dictation Number [id number]  PLAN OF CARE: Admit for overnight observation  PATIENT DISPOSITION:  PACU - hemodynamically stable.   Delay start of Pharmacological VTE agent (>24hrs) due to surgical blood loss or risk of bleeding: no

## 2020-09-24 NOTE — Anesthesia Procedure Notes (Signed)
Spinal  Patient location during procedure: OR End time: 12/26/2019 12:49 PM Reason for block: surgical anesthesia Staffing Performed: resident/CRNA  Resident/CRNA: Rosaland Lao, CRNA Preanesthetic Checklist Completed: patient identified, IV checked, site marked, risks and benefits discussed, surgical consent, monitors and equipment checked, pre-op evaluation and timeout performed Spinal Block Patient position: sitting Prep: DuraPrep Patient monitoring: heart rate, cardiac monitor, continuous pulse ox and blood pressure Approach: midline Location: L3-4 Injection technique: single-shot Needle Needle type: Pencan  Needle gauge: 24 G Needle length: 10 cm Assessment Sensory level: T4 Events: CSF return

## 2020-09-24 NOTE — Plan of Care (Signed)
  Problem: Activity: Goal: Ability to avoid complications of mobility impairment will improve Outcome: Progressing   Problem: Clinical Measurements: Goal: Postoperative complications will be avoided or minimized Outcome: Progressing   Problem: Pain Management: Goal: Pain level will decrease with appropriate interventions Outcome: Progressing   Problem: Skin Integrity: Goal: Will show signs of wound healing Outcome: Progressing   

## 2020-09-24 NOTE — Anesthesia Postprocedure Evaluation (Signed)
Anesthesia Post Note  Patient: Mary Blevins  Procedure(s) Performed: RIGHT TOTAL HIP ARTHROPLASTY ANTERIOR APPROACH (Right: Hip)     Patient location during evaluation: PACU Anesthesia Type: Spinal Level of consciousness: oriented and awake and alert Pain management: pain level controlled Vital Signs Assessment: post-procedure vital signs reviewed and stable Respiratory status: spontaneous breathing, respiratory function stable and nonlabored ventilation Cardiovascular status: blood pressure returned to baseline and stable Postop Assessment: no headache, no backache, no apparent nausea or vomiting and spinal receding Anesthetic complications: no   No notable events documented.  Last Vitals:  Vitals:   09/24/20 1515 09/24/20 1530  BP: 134/85 133/78  Pulse: 63 (!) 59  Resp: 16 16  Temp:    SpO2: 100% 100%    Last Pain:  Vitals:   09/24/20 1530  TempSrc:   PainSc: [place]

## 2020-09-24 NOTE — Evaluation (Signed)
Physical Therapy Evaluation Patient Details Name: Mary Blevins MRN: [id number] DOB: 09-17-1945 Today's Date: 09/24/2020   History of Present Illness  Patient is 75 y.o. female s/p Rt THA anterior approach on 09/24/20 with PMH significant for OA, COPD, HLD, HTN, hypothyroidism, oseteopenia.    Clinical Impression  Mary Blevins is a 75 y.o. female POD 0 s/p Rt THA. Patient reports independence with [facility] for mobility at baseline. Patient is now limited by functional impairments (see PT problem list below) and requires min assist for transfers and gait with RW. Patient was able to ambulate ~40 feet with RW and min assist. Patient instructed in exercise to facilitate circulation to manage edema and reduce risk of DVT. Patient will benefit from continued skilled PT interventions to address impairments and progress towards PLOF. Acute PT will follow to progress mobility and stair training in preparation for safe discharge home.     Follow Up Recommendations Follow surgeon's recommendation for DC plan and follow-up therapies;Home health PT    Equipment Recommendations  Rolling walker with 5" wheels;3in1 (PT)    Recommendations for Other Services       Precautions / Restrictions Precautions Precautions: Fall Restrictions Weight Bearing Restrictions: No RLE Weight Bearing: Weight bearing as tolerated      Mobility  Bed Mobility Overal bed mobility: Needs Assistance Bed Mobility: Supine to Sit     Supine to sit: Min guard;HOB elevated     General bed mobility comments: cues to use rail, pt taking extra time to bring LE's off EOB and scoot forward.    Transfers Overall transfer level: Needs assistance Equipment used: Rolling walker (2 wheeled) Transfers: Sit to/from Stand Sit to Stand: Min assist         General transfer comment: cues for hand placement/technique. min assist to power up and steady with hand transition to RW.  Ambulation/Gait Ambulation/Gait  assistance: Min assist Gait Distance (Feet): 40 Feet Assistive device: Rolling walker (2 wheeled) Gait Pattern/deviations: Step-to pattern;Decreased stride length;Decreased weight shift to right Gait velocity: decr   General Gait Details: VC's for step to patttern and proximity to RW as pt tending to advance walker too far and lean forward. assist to position intermittently.  Stairs            Wheelchair Mobility    Modified Rankin (Stroke Patients Only)       Balance Overall balance assessment: Needs assistance Sitting-balance support: Feet supported Sitting balance-Leahy Scale: Good     Standing balance support: During functional activity;Bilateral upper extremity supported Standing balance-Leahy Scale: Fair                               Pertinent Vitals/Pain Pain Assessment: 0-10 Pain Score: 5  Pain Location: Rt hip Pain Descriptors / Indicators: Burning;Aching;Discomfort Pain Intervention(s): Limited activity within patient's tolerance;Monitored during session;Premedicated before session;Repositioned;Ice applied    Home Living Family/patient expects to be discharged to:: Private residence Living Arrangements: Alone Available Help at Discharge: Family Type of Home: House Home Access: Stairs to enter Entrance Stairs-Rails: Right Entrance Stairs-Number of Steps: 3-4 Home Layout: Two level (not sure if she can stay on main level) Home Equipment: Cane - single point;Shower seat Additional Comments: pt staying at daughters home (described above) pt unsure about steps in duaghters home    Prior Function Level of Independence: Independent               Hand Dominance   Dominant  Hand: Right    Extremity/Trunk Assessment   Upper Extremity Assessment Upper Extremity Assessment: Overall WFL for tasks assessed    Lower Extremity Assessment Lower Extremity Assessment: Generalized weakness    Cervical / Trunk Assessment Cervical / Trunk  Assessment: Kyphotic  Communication   Communication: No difficulties  Cognition Arousal/Alertness: Awake/alert Behavior During Therapy: WFL for tasks assessed/performed Overall Cognitive Status: Within Functional Limits for tasks assessed                                        General Comments      Exercises Total Joint Exercises Ankle Circles/Pumps: AROM;Both;20 reps;Seated   Assessment/Plan    PT Assessment Patient needs continued PT services  PT Problem List Decreased strength;Decreased range of motion;Decreased activity tolerance;Decreased balance;Decreased mobility;Decreased knowledge of use of DME;Decreased knowledge of precautions       PT Treatment Interventions DME instruction;Gait training;Stair training;Balance training;Therapeutic activities;Therapeutic exercise;Functional mobility training;Patient/family education    PT Goals (Current goals can be found in the Care Plan section)  Acute Rehab PT Goals Patient Stated Goal: get back independence and return home PT Goal Formulation: With patient Time For Goal Achievement: 10/01/20 Potential to Achieve Goals: Good    Frequency 7X/week   Barriers to discharge        Co-evaluation               AM-PAC PT "6 Clicks" Mobility  Outcome Measure Help needed turning from your back to your side while in a flat bed without using bedrails?: None Help needed moving from lying on your back to sitting on the side of a flat bed without using bedrails?: A Little Help needed moving to and from a bed to a chair (including a wheelchair)?: A Little Help needed standing up from a chair using your arms (e.g., wheelchair or bedside chair)?: A Little Help needed to walk in hospital room?: A Little Help needed climbing 3-5 steps with a railing? : A Little 6 Click Score: 19    End of Session Equipment Utilized During Treatment: Gait belt Activity Tolerance: Patient tolerated treatment well Patient left: in  chair;with call bell/phone within reach;with chair alarm set Nurse Communication: Mobility status PT Visit Diagnosis: Muscle weakness (generalized) (M62.81);Difficulty in walking, not elsewhere classified (R26.2)    Time: [id number] PT Time Calculation (min) (ACUTE ONLY): 24 min   Charges:   PT Evaluation $PT Eval Low Complexity: 1 Low PT Treatments $Gait Training: 8-22 mins        Verner Mould, DPT Acute Rehabilitation Services Office [phone] Pager [phone]   Jacques Navy 09/24/2020, 6:47 PM

## 2020-09-24 NOTE — Anesthesia Preprocedure Evaluation (Addendum)
Anesthesia Evaluation  Patient identified by MRN, date of birth, ID band Patient awake    Reviewed: Allergy & Precautions, NPO status , Patient's Chart, lab work & pertinent test results  Airway Mallampati: III  TM Distance: >3 FB Neck ROM: Full    Dental  (+) Dental Advisory Given, Lower Dentures, Upper Dentures   Pulmonary COPD,  COPD inhaler, former smoker,    Pulmonary exam normal breath sounds clear to auscultation       Cardiovascular hypertension, Pt. on medications (-) angina(-) Past MI Normal cardiovascular exam+ Valvular Problems/Murmurs MVP  Rhythm:Regular Rate:Normal     Neuro/Psych  Headaches, PSYCHIATRIC DISORDERS Anxiety    GI/Hepatic negative GI ROS, Neg liver ROS,   Endo/Other  Hypothyroidism   Renal/GU negative Renal ROS     Musculoskeletal  (+) Arthritis , Osteoarthritis,  right hip osteoarthritis   Abdominal   Peds  Hematology negative hematology ROS (+) Plt 358k on 08/31/20   Anesthesia Other Findings Day of surgery medications reviewed with the patient.  Reproductive/Obstetrics                            Anesthesia Physical Anesthesia Plan  ASA: 3  Anesthesia Plan: Spinal   Post-op Pain Management:    Induction: Intravenous  PONV Risk Score and Plan: 2 and Propofol infusion, Dexamethasone and Ondansetron  Airway Management Planned: Natural Airway and Nasal Cannula  Additional Equipment:   Intra-op Plan:   Post-operative Plan:   Informed Consent: I have reviewed the patients History and Physical, chart, labs and discussed the procedure including the risks, benefits and alternatives for the proposed anesthesia with the patient or authorized representative who has indicated his/her understanding and acceptance.     Dental advisory given  Plan Discussed with: CRNA  Anesthesia Plan Comments:         Anesthesia Quick Evaluation

## 2020-09-24 NOTE — Interval H&P Note (Signed)
History and Physical Interval Note: Patient understands that she is here today for a right total hip replacement to treat her right hip pain and right hip osteoarthritis.  There has been no acute or interval change in her medical status.  See recent H&P.  The risks and benefits of surgery been explained in detail and informed consent is obtained.  09/24/2020 10:38 AM  Mary Blevins  has presented today for surgery, with the diagnosis of right hip osteoarthritis.  The various methods of treatment have been discussed with the patient and family. After consideration of risks, benefits and other options for treatment, the patient has consented to  Procedure(s) with comments: RIGHT TOTAL HIP ARTHROPLASTY ANTERIOR APPROACH (Right) - Needs RNFA as a surgical intervention.  The patient's history has been reviewed, patient examined, no change in status, stable for surgery.  I have reviewed the patient's chart and labs.  Questions were answered to the patient's satisfaction.     Mcarthur Rossetti

## 2020-09-24 NOTE — Transfer of Care (Signed)
Immediate Anesthesia Transfer of Care Note  Patient: Mary Blevins  Procedure(s) Performed: RIGHT TOTAL HIP ARTHROPLASTY ANTERIOR APPROACH (Right: Hip)  Patient Location: PACU  Anesthesia Type:MAC and Spinal  Level of Consciousness: awake, alert  and oriented  Airway & Oxygen Therapy: Patient Spontanous Breathing and Patient connected to face mask  Post-op Assessment: Report given to RN and Post -op Vital signs reviewed and stable  Post vital signs: Reviewed and stable  Last Vitals:  Vitals Value Taken Time  BP 144/87 09/24/20 1348  Temp    Pulse    Resp 23 09/24/20 1351  SpO2    Vitals shown include unvalidated device data.  Last Pain:  Vitals:   09/24/20 0917  TempSrc: Oral         Complications: No notable events documented.

## 2020-09-25 DIAGNOSIS — Z79899 Other long term (current) drug therapy: Secondary | ICD-10-CM | POA: Diagnosis not present

## 2020-09-25 DIAGNOSIS — M1611 Unilateral primary osteoarthritis, right hip: Secondary | ICD-10-CM | POA: Diagnosis not present

## 2020-09-25 DIAGNOSIS — I10 Essential (primary) hypertension: Secondary | ICD-10-CM | POA: Diagnosis not present

## 2020-09-25 DIAGNOSIS — Z87891 Personal history of nicotine dependence: Secondary | ICD-10-CM | POA: Diagnosis not present

## 2020-09-25 DIAGNOSIS — E039 Hypothyroidism, unspecified: Secondary | ICD-10-CM | POA: Diagnosis not present

## 2020-09-25 DIAGNOSIS — R531 Weakness: Secondary | ICD-10-CM | POA: Diagnosis not present

## 2020-09-25 DIAGNOSIS — Z85038 Personal history of other malignant neoplasm of large intestine: Secondary | ICD-10-CM | POA: Diagnosis not present

## 2020-09-25 DIAGNOSIS — J449 Chronic obstructive pulmonary disease, unspecified: Secondary | ICD-10-CM | POA: Diagnosis not present

## 2020-09-25 LAB — CBC
HCT: 37.8 % (ref 36.0–46.0)
Hemoglobin: 12.4 g/dL (ref 12.0–15.0)
MCH: 29.6 pg (ref 26.0–34.0)
MCHC: 32.8 g/dL (ref 30.0–36.0)
MCV: 90.2 fL (ref 80.0–100.0)
Platelets: 291 10*3/uL (ref 150–400)
RBC: 4.19 MIL/uL (ref 3.87–5.11)
RDW: 13.7 % (ref 11.5–15.5)
WBC: 15.5 10*3/uL — ABNORMAL HIGH (ref 4.0–10.5)
nRBC: 0 % (ref 0.0–0.2)

## 2020-09-25 LAB — BASIC METABOLIC PANEL
Anion gap: 5 (ref 5–15)
BUN: 14 mg/dL (ref 8–23)
CO2: 25 mmol/L (ref 22–32)
Calcium: 8.5 mg/dL — ABNORMAL LOW (ref 8.9–10.3)
Chloride: 106 mmol/L (ref 98–111)
Creatinine, Ser: 0.66 mg/dL (ref 0.44–1.00)
GFR, Estimated: >60 mL/min (ref >60)
Glucose, Bld: 135 mg/dL — ABNORMAL HIGH (ref 70–99)
Potassium: 4 mmol/L (ref 3.5–5.1)
Sodium: 136 mmol/L (ref 135–145)

## 2020-09-25 MED ORDER — TIZANIDINE HCL 4 MG PO TABS: ORAL_TABLET | ORAL | 0 refills | Status: DC

## 2020-09-25 MED ORDER — LACTATED RINGERS IV BOLUS
1000.0000 mL | Freq: Once | INTRAVENOUS | Status: AC
  Administered 2020-09-25: 1000 mL via INTRAVENOUS

## 2020-09-25 MED ORDER — TAMSULOSIN HCL 0.4 MG PO CAPS
0.4000 mg | ORAL_CAPSULE | Freq: Once | ORAL | Status: AC
  Administered 2020-09-25: 0.4 mg via ORAL
  Filled 2020-09-25: qty 1

## 2020-09-25 MED ORDER — OXYCODONE HCL 5 MG PO TABS: 5.0000 mg | ORAL_TABLET | Freq: Four times a day (QID) | ORAL | 0 refills | Status: DC | PRN

## 2020-09-25 MED ORDER — ASPIRIN 81 MG PO CHEW
81.0000 mg | CHEWABLE_TABLET | Freq: Two times a day (BID) | ORAL | 0 refills | Status: DC
Start: 2020-09-25 — End: 2021-01-11

## 2020-09-25 MED ORDER — HYDROCHLOROTHIAZIDE 12.5 MG PO CAPS
12.5000 mg | ORAL_CAPSULE | Freq: Every day | ORAL | Status: AC
  Administered 2020-09-25: 12.5 mg via ORAL
  Filled 2020-09-25: qty 1

## 2020-09-25 NOTE — Progress Notes (Signed)
Spoke with Dr. Louanne Skye regarding patient not voiding since removal of foley cath this morning. Per Dr. Louanne Skye, give 1,070m LR bolus, Hydrochlorothiazide one time dose, and in/out cath q6h PRN until he is "able to see her in the morning" after bolus given. Will notify patient and continue to monitor.

## 2020-09-25 NOTE — Discharge Instructions (Signed)

## 2020-09-25 NOTE — Progress Notes (Signed)
Physical Therapy Treatment Patient Details Name: Mary Blevins MRN: [id number] DOB: 1945/09/28 Today's Date: 09/25/2020    History of Present Illness Patient is 75 y.o. female s/p Rt THA anterior approach on 09/24/20 with PMH significant for OA, COPD, HLD, HTN, hypothyroidism, oseteopenia.    PT Comments    Pt very cooperative and progressing steadily with mobility.  Pt up to ambulate increased distance in hall with noted improvement in activity tolerance and stability.  Follow Up Recommendations  Follow surgeon's recommendation for DC plan and follow-up therapies;Home health PT     Equipment Recommendations  Rolling walker with 5" wheels;3in1 (PT)    Recommendations for Other Services       Precautions / Restrictions Precautions Precautions: Fall Restrictions Weight Bearing Restrictions: No RLE Weight Bearing: Weight bearing as tolerated    Mobility  Bed Mobility Overal bed mobility: Needs Assistance Bed Mobility: Supine to Sit;Sit to Supine     Supine to sit: Min guard Sit to supine: Min assist   General bed mobility comments: Increased time with cues for sequence and use of L LE to self assist.  Physical assist to bring R LE back onto bed.    Transfers Overall transfer level: Needs assistance Equipment used: Rolling walker (2 wheeled) Transfers: Sit to/from Stand Sit to Stand: Min assist         General transfer comment: cues for hand placement/technique. min assist to power up and steady with hand transition to RW.  Ambulation/Gait Ambulation/Gait assistance: Min guard Gait Distance (Feet): 100 Feet Assistive device: Rolling walker (2 wheeled) Gait Pattern/deviations: Step-to pattern;Decreased stride length;Decreased weight shift to right;Shuffle;Trunk flexed Gait velocity: decr   General Gait Details: cues for sequence, posture and position from RW.   Stairs             Wheelchair Mobility    Modified Rankin (Stroke Patients Only)        Balance Overall balance assessment: Needs assistance Sitting-balance support: Feet supported Sitting balance-Leahy Scale: Good     Standing balance support: During functional activity;Bilateral upper extremity supported Standing balance-Leahy Scale: Fair                              Cognition Arousal/Alertness: Awake/alert Behavior During Therapy: WFL for tasks assessed/performed Overall Cognitive Status: Within Functional Limits for tasks assessed                                        Exercises Total Joint Exercises Ankle Circles/Pumps: AROM;Both;20 reps;Seated Quad Sets: AROM;Both;10 reps;Supine Heel Slides: AAROM;Right;20 reps;Supine Hip ABduction/ADduction: AAROM;Right;15 reps;Supine    General Comments        Pertinent Vitals/Pain Pain Assessment: 0-10 Pain Score: 6  Pain Location: Rt hip Pain Descriptors / Indicators: Burning;Aching;Discomfort Pain Intervention(s): Limited activity within patient's tolerance;Monitored during session;Premedicated before session;Ice applied    Home Living                      Prior Function            PT Goals (current goals can now be found in the care plan section) Acute Rehab PT Goals Patient Stated Goal: get back independence and return home PT Goal Formulation: With patient Time For Goal Achievement: 10/01/20 Potential to Achieve Goals: Good Progress towards PT goals: Progressing toward goals    Frequency  7X/week      PT Plan Current plan remains appropriate    Co-evaluation              AM-PAC PT "6 Clicks" Mobility   Outcome Measure  Help needed turning from your back to your side while in a flat bed without using bedrails?: A Little Help needed moving from lying on your back to sitting on the side of a flat bed without using bedrails?: A Little Help needed moving to and from a bed to a chair (including a wheelchair)?: A Little Help needed standing up  from a chair using your arms (e.g., wheelchair or bedside chair)?: A Little Help needed to walk in hospital room?: A Little Help needed climbing 3-5 steps with a railing? : A Lot 6 Click Score: 17    End of Session Equipment Utilized During Treatment: Gait belt Activity Tolerance: Patient tolerated treatment well Patient left: with call bell/phone within reach;in bed;with bed alarm set Nurse Communication: Mobility status PT Visit Diagnosis: Muscle weakness (generalized) (M62.81);Difficulty in walking, not elsewhere classified (R26.2)     Time: [id number] PT Time Calculation (min) (ACUTE ONLY): 21 min  Charges:  $Gait Training: 8-22 mins $Therapeutic Exercise: 8-22 mins                     Debe Coder PT Acute Rehabilitation Services Pager [phone] Office [phone]    Tuan Tippin 09/25/2020, 3:45 PM

## 2020-09-25 NOTE — Progress Notes (Signed)
Patient ID: Mary Blevins, female   DOB: Jul 03, 1945, 75 y.o.   MRN: [id number] Urine out put is extremely low, has received boluses throughout the day and bladder scan showing only 125cc in bladder, do not sense need to void. Hypovolemia likely due to blood loss and decreased intravascular volumn. I will give bolus of 1 liter LR and give 12.5 mg of HCTZ. Was to go home but given poor urinary output need IVF hydration and reassessment in the AM as to whether she is euvolemic And which time she will be discharged. Marland Kitchen

## 2020-09-25 NOTE — Progress Notes (Signed)
Patient unable to void since foley cath removal at 0520 this morning. Bladder scan revealed 34m, patient ambulated to bathroom with no success. IVF infusing, encouraging PO intake. Dr. BNinfa Lindenmade aware, will continue to monitor.

## 2020-09-25 NOTE — TOC Transition Note (Signed)
Transition of Care [facility]) - CM/SW Discharge Note   Patient Details  Name: Mary Blevins MRN: [id number] Date of Birth: 01/11/1946  [facility]) CM/SW Contact:  Lennart Pall, LCSW Phone Number: 09/25/2020, 10:33 AM   Clinical Narrative:    Met briefly with pt and confirming need for 3n1 and rw - ordered via [employer] for delivery to room.  Plan for HHPT via [place] as prearranged via MD office.  No further TOC needs.   Final next level of care: [facility] Barriers to Discharge: No Barriers Identified   Patient Goals and CMS Choice Patient states their goals for this hospitalization and ongoing recovery are:: return home      Discharge Placement                       Discharge Plan and Services                DME Arranged: 3-N-1, Walker rolling DME Agency: [employer] Date DME Agency Contacted: 09/25/20 Time DME Agency Contacted: [phone] Representative spoke with at DME Agency: [place]: PT [place]: [place] Determinants of Health ([place]) Interventions     Readmission Risk Interventions No flowsheet data found.

## 2020-09-25 NOTE — Op Note (Signed)
NAME: Mary Blevins, Mary Blevins MEDICAL RECORD NO: [id number] ACCOUNT NO: [id number] DATE OF BIRTH: [birth year] FACILITY: [facility] LOCATION: [facility] PHYSICIAN: Lind Guest. Ninfa Linden, MD  Operative Report   DATE OF PROCEDURE: 09/24/2020   PREOPERATIVE DIAGNOSIS:  Primary osteoarthritis, degenerative joint disease, right hip.  POSTOPERATIVE DIAGNOSIS:  Primary osteoarthritis, degenerative joint disease, right hip.  PROCEDURE:  Right total hip arthroplasty through direct anterior approach.  IMPLANTS:  DePuy sector Gription acetabular component size 50, size 32+0 neutral polyethylene liner, single screw in the acetabulum, size 10 Corail femoral component with standard offset, size 32+1 metal hip ball.  SURGEON:  Jean Rosenthal, M.D.  ASSISTANT:  Arrie Aran, RNFA.  ESTIMATED BLOOD LOSS:  200-250 mL.  ANTIBIOTICS:  2 g IV Ancef.  ANESTHESIA:  Spinal.  COMPLICATIONS:  None.  INDICATIONS:  The patient is a 75 year old female well known to me.  She has developed debilitating arthritis of her right hip, verified radiographically and clinically.  She has tried and failed all forms of conservative treatment.  At this point, her  right hip pain is daily and it is detrimentally affecting her mobility, her quality of life and her activities of daily living to the point she does wish to proceed with a total hip arthroplasty.  She was fully aware of the risk of acute blood loss  anemia, nerve or vessel injury, fracture, infection, dislocation, DVT, implant failure and skin and soft tissue issues as well as leg length discrepancy.  She understands our goals are to decrease pain, improve mobility and overall improve quality of  life.  DESCRIPTION OF PROCEDURE:  After informed consent was obtained, appropriate right hip was marked.  She was brought to the operating room and sat up on the stretcher where spinal anesthesia was obtained.  She was laid in supine position on the stretcher.   Foley catheter  was placed and traction boots were placed on both her feet.  Next, she was placed supine on the Hana fracture table, the perineal post in place and both legs in line skeletal traction devices.  No traction applied.  Her right operative  hip was prepped and draped with DuraPrep and sterile drapes.  A timeout was called and she was identified correct patient, correct right hip.  I then made an incision just inferior and posterior to the anterior superior iliac spine and carried this  obliquely down the leg.  We dissected down tensor fascia lata muscle.  Tensor fascia was then divided longitudinally to proceed with direct anterior approach to the hip.  We identified and cauterized circumflex vessels, identified the hip capsule, opened  the hip capsule in an L-type format finding moderate joint effusion.  We placed curved retractors within the medial and lateral joint capsule, made a femoral neck cut with oscillating saw just proximal to the lesser trochanter and completed this with  osteotome, we placed a corkscrew guide in the femoral head and removed the femoral head in its entirety and found an area devoid of cartilage.  We then placed a bent Hohmann over the medial acetabular rim and removed remnants of the acetabular labrum and  other debris.  I then began broaching using the carotid broaching system from a size 8 going up to a size 10, with a size 10 I placed a trial standard offset femoral neck and a 32+1 hip ball, reduced this in acetabulum.  We were pleased with leg length,  offset, range of motion and stability and most importantly stability assessed radiographically and mechanically.  We  then dislocated the hip, removed the trial components.  We placed the real Corail femoral component, size 10 with standard offset and  the real 32+1 metal hip ball and again reduced in the acetabulum.  We appreciated the stability, assessed radiographically and mechanically.  We then irrigated the soft tissue with  normal saline solution using pulsatile lavage.  The joint capsule was  closed with interrupted #1 Ethibond suture followed by #1 Vicryl suture to close the tensor fascia.  0 Vicryl was used to close the deep tissue and 2-0 Vicryl was used to close the subcutaneous tissue.  The skin was reapproximated with staples.  An  Aquacel dressing was applied.  She was taken off the Hana table and taken to recovery in stable condition with all final counts being correct, there were no complications noted.     SUJ D: 09/24/2020 1:26:44 pm T: 09/25/2020 12:47:00 am  JOB: [id number]

## 2020-09-25 NOTE — Progress Notes (Signed)
Patient ambulated to bathroom with assistance due to urge to void. Unable to void. Assisted back to bed and in/out done by this nurse and Geoffry Paradise, RN. 300 mL amber urine emptied out of bladder. Bolus continues to infuse, will continue to monitor.

## 2020-09-25 NOTE — Progress Notes (Signed)
Physical Therapy Treatment Patient Details Name: Mary Blevins MRN: [id number] DOB: Sep 27, 1945 Today's Date: 09/25/2020    History of Present Illness Patient is 75 y.o. female s/p Rt THA anterior approach on 09/24/20 with PMH significant for OA, COPD, HLD, HTN, hypothyroidism, oseteopenia.    PT Comments    Pt very motivated and progressing steadily with mobility.  Pt up to ambulate increased distance in halls and initiated therex program.   Follow Up Recommendations  Follow surgeon's recommendation for DC plan and follow-up therapies;Home health PT     Equipment Recommendations  Rolling walker with 5" wheels;3in1 (PT)    Recommendations for Other Services       Precautions / Restrictions Precautions Precautions: Fall Restrictions Weight Bearing Restrictions: No RLE Weight Bearing: Weight bearing as tolerated    Mobility  Bed Mobility Overal bed mobility: Needs Assistance Bed Mobility: Supine to Sit     Supine to sit: Min guard     General bed mobility comments: Increased time with cues for sequence and use of L LE to self assist.    Transfers Overall transfer level: Needs assistance Equipment used: Rolling walker (2 wheeled) Transfers: Sit to/from Stand Sit to Stand: Min assist         General transfer comment: cues for hand placement/technique. min assist to power up and steady with hand transition to RW.  Ambulation/Gait Ambulation/Gait assistance: Min assist Gait Distance (Feet): 90 Feet Assistive device: Rolling walker (2 wheeled) Gait Pattern/deviations: Step-to pattern;Decreased stride length;Decreased weight shift to right;Shuffle;Trunk flexed Gait velocity: decr   General Gait Details: cues for sequence, posture and position from RW.   Stairs             Wheelchair Mobility    Modified Rankin (Stroke Patients Only)       Balance Overall balance assessment: Needs assistance Sitting-balance support: Feet supported Sitting  balance-Leahy Scale: Good     Standing balance support: During functional activity;Bilateral upper extremity supported Standing balance-Leahy Scale: Fair                              Cognition Arousal/Alertness: Awake/alert Behavior During Therapy: WFL for tasks assessed/performed Overall Cognitive Status: Within Functional Limits for tasks assessed                                        Exercises Total Joint Exercises Ankle Circles/Pumps: AROM;Both;20 reps;Seated Quad Sets: AROM;Both;10 reps;Supine Heel Slides: AAROM;Right;20 reps;Supine Hip ABduction/ADduction: AAROM;Right;15 reps;Supine    General Comments        Pertinent Vitals/Pain Pain Assessment: 0-10 Pain Score: 6  Pain Location: Rt hip Pain Descriptors / Indicators: Burning;Aching;Discomfort Pain Intervention(s): Limited activity within patient's tolerance;Monitored during session;Premedicated before session;Ice applied    Home Living                      Prior Function            PT Goals (current goals can now be found in the care plan section) Acute Rehab PT Goals Patient Stated Goal: get back independence and return home PT Goal Formulation: With patient Time For Goal Achievement: 10/01/20 Potential to Achieve Goals: Good Progress towards PT goals: Progressing toward goals    Frequency    7X/week      PT Plan Current plan remains appropriate    Co-evaluation  AM-PAC PT "6 Clicks" Mobility   Outcome Measure  Help needed turning from your back to your side while in a flat bed without using bedrails?: A Little Help needed moving from lying on your back to sitting on the side of a flat bed without using bedrails?: A Little Help needed moving to and from a bed to a chair (including a wheelchair)?: A Little Help needed standing up from a chair using your arms (e.g., wheelchair or bedside chair)?: A Little Help needed to walk in hospital  room?: A Little Help needed climbing 3-5 steps with a railing? : A Lot 6 Click Score: 17    End of Session Equipment Utilized During Treatment: Gait belt Activity Tolerance: Patient tolerated treatment well Patient left: in chair;with call bell/phone within reach;with chair alarm set Nurse Communication: Mobility status PT Visit Diagnosis: Muscle weakness (generalized) (M62.81);Difficulty in walking, not elsewhere classified (R26.2)     Time: [id number] PT Time Calculation (min) (ACUTE ONLY): 30 min  Charges:  $Gait Training: 8-22 mins $Therapeutic Exercise: 8-22 mins                     Debe Coder PT Acute Rehabilitation Services Pager [phone] Office [phone]    Bryanda Mikel 09/25/2020, 3:41 PM

## 2020-09-25 NOTE — Progress Notes (Signed)
Subjective: 1 Day Post-Op Procedure(s) (LRB): RIGHT TOTAL HIP ARTHROPLASTY ANTERIOR APPROACH (Right) Patient reports pain as moderate.  Her vital signs, H/H, and creatine are all normal.  She has not urinated, but her bladder scan shows minimal urine.  Objective: Vital signs in last 24 hours: Temp:  [97.4 F (36.3 C)-98.9 F (37.2 C)] 98.5 F (36.9 C) (09/03 0946) Pulse Rate:  [59-82] 78 (09/03 0946) Resp:  [12-22] 18 (09/03 0946) BP: (101-144)/(67-87) 123/69 (09/03 0946) SpO2:  [92 %-100 %] 97 % (09/03 0946) Weight:  [73.7 kg] 73.7 kg (09/02 1721)  Intake/Output from previous day: 09/02 0701 - 09/03 0700 In: 4438.7 [P.O.:1560; I.V.:2728.7; IV Piggyback:150.1] Out: 1350 [Urine:1150; Blood:200] Intake/Output this shift: Total I/O In: 240 [P.O.:240] Out: -   Recent Labs    09/25/20 0326  HGB 12.4   Recent Labs    09/25/20 0326  WBC 15.5*  RBC 4.19  HCT 37.8  PLT 291   Recent Labs    09/25/20 0326  NA 136  K 4.0  CL 106  CO2 25  BUN 14  CREATININE 0.66  GLUCOSE 135*  CALCIUM 8.5*   No results for input(s): LABPT, INR in the last 72 hours.  Sensation intact distally Intact pulses distally Dorsiflexion/Plantar flexion intact Incision: scant drainage   Assessment/Plan: 1 Day Post-Op Procedure(s) (LRB): RIGHT TOTAL HIP ARTHROPLASTY ANTERIOR APPROACH (Right) Up with therapy Plan for discharge tomorrow Discharge home with home health  Encourage hydration and mobility to help with urination.    Mcarthur Rossetti 09/25/2020, 12:39 PM

## 2020-09-26 DIAGNOSIS — R34 Anuria and oliguria: Secondary | ICD-10-CM | POA: Diagnosis not present

## 2020-09-26 DIAGNOSIS — I10 Essential (primary) hypertension: Secondary | ICD-10-CM | POA: Diagnosis not present

## 2020-09-26 DIAGNOSIS — J449 Chronic obstructive pulmonary disease, unspecified: Secondary | ICD-10-CM | POA: Diagnosis not present

## 2020-09-26 DIAGNOSIS — Z79899 Other long term (current) drug therapy: Secondary | ICD-10-CM | POA: Diagnosis not present

## 2020-09-26 DIAGNOSIS — E039 Hypothyroidism, unspecified: Secondary | ICD-10-CM | POA: Diagnosis not present

## 2020-09-26 DIAGNOSIS — M1611 Unilateral primary osteoarthritis, right hip: Secondary | ICD-10-CM | POA: Diagnosis not present

## 2020-09-26 DIAGNOSIS — Z87891 Personal history of nicotine dependence: Secondary | ICD-10-CM | POA: Diagnosis not present

## 2020-09-26 DIAGNOSIS — Z85038 Personal history of other malignant neoplasm of large intestine: Secondary | ICD-10-CM | POA: Diagnosis not present

## 2020-09-26 LAB — CBC WITH DIFFERENTIAL/PLATELET
Abs Immature Granulocytes: 0.07 10*3/uL (ref 0.00–0.07)
Basophils Absolute: 0 10*3/uL (ref 0.0–0.1)
Basophils Relative: 0 %
Eosinophils Absolute: 0.1 10*3/uL (ref 0.0–0.5)
Eosinophils Relative: 0 %
HCT: 35.7 % — ABNORMAL LOW (ref 36.0–46.0)
Hemoglobin: 11.8 g/dL — ABNORMAL LOW (ref 12.0–15.0)
Immature Granulocytes: 1 %
Lymphocytes Relative: 15 %
Lymphs Abs: 2.2 10*3/uL (ref 0.7–4.0)
MCH: 29.6 pg (ref 26.0–34.0)
MCHC: 33.1 g/dL (ref 30.0–36.0)
MCV: 89.5 fL (ref 80.0–100.0)
Monocytes Absolute: 1.6 10*3/uL — ABNORMAL HIGH (ref 0.1–1.0)
Monocytes Relative: 11 %
Neutro Abs: 10.8 10*3/uL — ABNORMAL HIGH (ref 1.7–7.7)
Neutrophils Relative %: 73 %
Platelets: 215 10*3/uL (ref 150–400)
RBC: 3.99 MIL/uL (ref 3.87–5.11)
RDW: 13.6 % (ref 11.5–15.5)
WBC: 14.8 10*3/uL — ABNORMAL HIGH (ref 4.0–10.5)
nRBC: 0 % (ref 0.0–0.2)

## 2020-09-26 LAB — BASIC METABOLIC PANEL
Anion gap: 7 (ref 5–15)
BUN: 19 mg/dL (ref 8–23)
CO2: 23 mmol/L (ref 22–32)
Calcium: 8.4 mg/dL — ABNORMAL LOW (ref 8.9–10.3)
Chloride: 100 mmol/L (ref 98–111)
Creatinine, Ser: 0.96 mg/dL (ref 0.44–1.00)
GFR, Estimated: >60 mL/min (ref >60)
Glucose, Bld: 127 mg/dL — ABNORMAL HIGH (ref 70–99)
Potassium: 3.9 mmol/L (ref 3.5–5.1)
Sodium: 130 mmol/L — ABNORMAL LOW (ref 135–145)

## 2020-09-26 NOTE — Progress Notes (Addendum)
     Subjective: 2 Days Post-Op Procedure(s) (LRB): RIGHT TOTAL HIP ARTHROPLASTY ANTERIOR APPROACH (Right) Awake, alert and oriented x 4. Decreased urine output improved with fluid bolus, and rehydration, Cr 0.66 increased to 0.96 even with hydration, given HCTZ 12.5 mg with good increase in voiding and urine output. Scheduled to go to daughter's home in [place] and will need to go up some stairs.   Patient reports pain as moderate.    Objective:   VITALS:  Temp:  [97.9 F (36.6 C)-98.7 F (37.1 C)] 98.6 F (37 C) (09/04 [id number]) Pulse Rate:  [67-87] 87 (09/04 0642) Resp:  [16-18] 16 (09/04 0642) BP: (114-131)/(66-85) 130/81 (09/04 0642) SpO2:  [90 %-97 %] 92 % (09/04 0741)  Neurologically intact ABD soft Neurovascular intact Sensation intact distally Intact pulses distally Dorsiflexion/Plantar flexion intact Incision: dressing C/D/I and scant drainage   LABS Recent Labs    09/25/20 0326 09/26/20 0324  HGB 12.4 11.8*  WBC 15.5* 14.8*  PLT 291 215   Recent Labs    09/25/20 0326 09/26/20 0324  NA 136 130*  K 4.0 3.9  CL 106 100  CO2 25 23  BUN 14 19  CREATININE 0.66 0.96  GLUCOSE 135* 127*   No results for input(s): LABPT, INR in the last 72 hours.   Assessment/Plan: 2 Days Post-Op Procedure(s) (LRB): RIGHT TOTAL HIP ARTHROPLASTY ANTERIOR APPROACH (Right) Urine output decreased likely secondary to hypovolemia.   Advance diet Up with therapy D/C IV fluids Discharge home with home health Have PT see this AM for stairs prior to discharge.  Meds and most of Discharge done by Dr. Ninfa Linden.   Basil Dess 09/26/2020, 9:39 AM Patient ID: Mary Blevins, female   DOB: 1945-06-12, 75 y.o.   MRN: [id number]

## 2020-09-26 NOTE — Progress Notes (Signed)
Physical Therapy Treatment Patient Details Name: Mary Blevins MRN: [id number] DOB: 1945/05/24 Today's Date: 09/26/2020    History of Present Illness Patient is 75 y.o. female s/p Rt THA anterior approach on 09/24/20 with PMH significant for OA, COPD, HLD, HTN, hypothyroidism, oseteopenia.    PT Comments    Pt continues very motivated but with decreased activity tolerance this date 2* increased pain.   Follow Up Recommendations  Follow surgeon's recommendation for DC plan and follow-up therapies;Home health PT     Equipment Recommendations  Rolling walker with 5" wheels;3in1 (PT)    Recommendations for Other Services       Precautions / Restrictions Precautions Precautions: Fall Restrictions Weight Bearing Restrictions: No RLE Weight Bearing: Weight bearing as tolerated    Mobility  Bed Mobility Overal bed mobility: Needs Assistance Bed Mobility: Supine to Sit     Supine to sit: Min guard     General bed mobility comments: Increased time with cues for sequence and use of gait belt to self assist R LE    Transfers Overall transfer level: Needs assistance Equipment used: Rolling walker (2 wheeled) Transfers: Sit to/from Stand Sit to Stand: Min assist         General transfer comment: cues for hand placement/technique. min assist to power up and steady with hand transition to RW.  Ambulation/Gait Ambulation/Gait assistance: Min guard Gait Distance (Feet): 56 Feet Assistive device: Rolling walker (2 wheeled) Gait Pattern/deviations: Step-to pattern;Decreased stride length;Decreased weight shift to right;Shuffle;Trunk flexed Gait velocity: decr   General Gait Details: cues for sequence, posture and position from RW.  Distance ltd by increased pain this date   Stairs             Wheelchair Mobility    Modified Rankin (Stroke Patients Only)       Balance Overall balance assessment: Needs assistance Sitting-balance support: Feet  supported Sitting balance-Leahy Scale: Good     Standing balance support: During functional activity;Bilateral upper extremity supported Standing balance-Leahy Scale: Fair                              Cognition Arousal/Alertness: Awake/alert Behavior During Therapy: WFL for tasks assessed/performed Overall Cognitive Status: Within Functional Limits for tasks assessed                                        Exercises Total Joint Exercises Ankle Circles/Pumps: AROM;Both;20 reps;Seated Quad Sets: AROM;Both;10 reps;Supine Heel Slides: AAROM;Right;20 reps;Supine Hip ABduction/ADduction: AAROM;Right;15 reps;Supine    General Comments        Pertinent Vitals/Pain Pain Assessment: 0-10 Pain Score: 7  Pain Location: Rt hip Pain Descriptors / Indicators: Burning;Aching;Discomfort Pain Intervention(s): Limited activity within patient's tolerance;Monitored during session;Premedicated before session;Ice applied    Home Living                      Prior Function            PT Goals (current goals can now be found in the care plan section) Acute Rehab PT Goals Patient Stated Goal: get back independence and return home PT Goal Formulation: With patient Time For Goal Achievement: 10/01/20 Potential to Achieve Goals: Good Progress towards PT goals: Progressing toward goals    Frequency    7X/week      PT Plan  Co-evaluation              AM-PAC PT "6 Clicks" Mobility   Outcome Measure  Help needed turning from your back to your side while in a flat bed without using bedrails?: A Little Help needed moving from lying on your back to sitting on the side of a flat bed without using bedrails?: A Little Help needed moving to and from a bed to a chair (including a wheelchair)?: A Little Help needed standing up from a chair using your arms (e.g., wheelchair or bedside chair)?: A Little Help needed to walk in hospital room?: A  Little Help needed climbing 3-5 steps with a railing? : A Lot 6 Click Score: 17    End of Session Equipment Utilized During Treatment: Gait belt Activity Tolerance: Patient tolerated treatment well Patient left: with call bell/phone within reach;in bed;with bed alarm set Nurse Communication: Mobility status PT Visit Diagnosis: Muscle weakness (generalized) (M62.81);Difficulty in walking, not elsewhere classified (R26.2)     Time: [id number] PT Time Calculation (min) (ACUTE ONLY): 24 min  Charges:  $Gait Training: 8-22 mins $Therapeutic Exercise: 8-22 mins                     [place] Pager [phone] Office [phone]    Sadiel Mota 09/26/2020, 1:10 PM

## 2020-09-26 NOTE — Progress Notes (Signed)
Physical Therapy Treatment Patient Details Name: Mary Blevins MRN: [id number] DOB: 04-27-1945 Today's Date: 09/26/2020    History of Present Illness Patient is 75 y.o. female s/p Rt THA anterior approach on 09/24/20 with PMH significant for OA, COPD, HLD, HTN, hypothyroidism, oseteopenia.    PT Comments    Pt continues very cooperative and with improving pain control this pm.  Pt up to ambulate limited distance in hall to negotiate stairs but with limited tolerance and noted SOB requiring seated rest break between attempts.  Spoke to dtr on phone who clarified that pt will not be able to stay on main level and needs to ascend to second story on return home.   Follow Up Recommendations  Follow surgeon's recommendation for DC plan and follow-up therapies;Home health PT     Equipment Recommendations  Rolling walker with 5" wheels;3in1 (PT)    Recommendations for Other Services       Precautions / Restrictions Precautions Precautions: Fall Restrictions Weight Bearing Restrictions: No RLE Weight Bearing: Weight bearing as tolerated    Mobility  Bed Mobility               General bed mobility comments: Pt up in chair and requests back to same    Transfers Overall transfer level: Needs assistance Equipment used: Rolling walker (2 wheeled) Transfers: Sit to/from Stand Sit to Stand: Min assist            Ambulation/Gait Ambulation/Gait assistance: Min guard Gait Distance (Feet): 44 Feet (and additional 21') Assistive device: Rolling walker (2 wheeled) Gait Pattern/deviations: Step-to pattern;Decreased stride length;Decreased weight shift to right;Shuffle;Trunk flexed Gait velocity: decr   General Gait Details: cues for sequence, posture and position from RW.  Distance ltd by fatigue   Stairs Stairs: Yes Stairs assistance: Min assist Stair Management: Two rails;One rail Right;Step to pattern;Forwards;With cane Number of Stairs: 5 General stair comments: up  two down three with bil rails.  Up two down three with rail and cane.  Cues for sequence and foot/cane placement.  Seated rest break between attempts 2* fatigue and SOB   Wheelchair Mobility    Modified Rankin (Stroke Patients Only)       Balance Overall balance assessment: Needs assistance Sitting-balance support: Feet supported Sitting balance-Leahy Scale: Good     Standing balance support: During functional activity Standing balance-Leahy Scale: Fair                              Cognition Arousal/Alertness: Awake/alert Behavior During Therapy: WFL for tasks assessed/performed Overall Cognitive Status: Within Functional Limits for tasks assessed                                        Exercises      General Comments        Pertinent Vitals/Pain Pain Assessment: 0-10 Pain Score: 5  Pain Location: Rt hip Pain Descriptors / Indicators: Burning;Aching;Discomfort Pain Intervention(s): Limited activity within patient's tolerance;Monitored during session;Premedicated before session;Ice applied    Home Living                      Prior Function            PT Goals (current goals can now be found in the care plan section) Acute Rehab PT Goals Patient Stated Goal: get back independence and return  home PT Goal Formulation: With patient Time For Goal Achievement: 10/01/20 Potential to Achieve Goals: Good Progress towards PT goals: Progressing toward goals    Frequency    7X/week      PT Plan Current plan remains appropriate    Co-evaluation              AM-PAC PT "6 Clicks" Mobility   Outcome Measure  Help needed turning from your back to your side while in a flat bed without using bedrails?: A Little Help needed moving from lying on your back to sitting on the side of a flat bed without using bedrails?: A Little Help needed moving to and from a bed to a chair (including a wheelchair)?: A Little Help needed  standing up from a chair using your arms (e.g., wheelchair or bedside chair)?: A Little Help needed to walk in hospital room?: A Little Help needed climbing 3-5 steps with a railing? : A Little 6 Click Score: 18    End of Session Equipment Utilized During Treatment: Gait belt Activity Tolerance: Patient tolerated treatment well;Patient limited by fatigue Patient left: with call bell/phone within reach;in chair;with chair alarm set Nurse Communication: Mobility status PT Visit Diagnosis: Muscle weakness (generalized) (M62.81);Difficulty in walking, not elsewhere classified (R26.2)     Time: 1415-1450 PT Time Calculation (min) (ACUTE ONLY): 35 min  Charges:  $Gait Training: 23-37 mins                     [place] Pager [phone] Office [phone]    Joanna Hall 09/26/2020, 4:20 PM

## 2020-09-27 DIAGNOSIS — E039 Hypothyroidism, unspecified: Secondary | ICD-10-CM | POA: Diagnosis not present

## 2020-09-27 DIAGNOSIS — I10 Essential (primary) hypertension: Secondary | ICD-10-CM | POA: Diagnosis not present

## 2020-09-27 DIAGNOSIS — Z87891 Personal history of nicotine dependence: Secondary | ICD-10-CM | POA: Diagnosis not present

## 2020-09-27 DIAGNOSIS — J449 Chronic obstructive pulmonary disease, unspecified: Secondary | ICD-10-CM | POA: Diagnosis not present

## 2020-09-27 DIAGNOSIS — Z85038 Personal history of other malignant neoplasm of large intestine: Secondary | ICD-10-CM | POA: Diagnosis not present

## 2020-09-27 DIAGNOSIS — M1611 Unilateral primary osteoarthritis, right hip: Secondary | ICD-10-CM | POA: Diagnosis not present

## 2020-09-27 DIAGNOSIS — Z79899 Other long term (current) drug therapy: Secondary | ICD-10-CM | POA: Diagnosis not present

## 2020-09-27 NOTE — Discharge Summary (Signed)
Patient ID: Mary Blevins MRN: [id number] DOB/AGE: [id number] y.o.  Admit date: 09/24/2020 Discharge date: 09/27/2020  Admission Diagnoses:  Principal Problem:   Unilateral primary osteoarthritis, right hip Active Problems:   Status post total replacement of right hip   Decreased urine output   Discharge Diagnoses:  Same  Past Medical History:  Diagnosis Date   Arthritis    COPD (chronic obstructive pulmonary disease) (HCC)    Dyspnea    Elevated lipids    Generalized headaches    History of pneumonia    Hyperlipidemia    Hypertension    Hypothyroidism    MVP (mitral valve prolapse)    palpitations   Osteopenia    Seasonal allergies    Sinusitis    Tubular adenoma of colon     Surgeries: Procedure(s): RIGHT TOTAL HIP ARTHROPLASTY ANTERIOR APPROACH on 09/24/2020   Consultants:   Discharged Condition: Improved  Hospital Course: Mary Blevins is an 75 y.o. female who was admitted 09/24/2020 for operative treatment ofUnilateral primary osteoarthritis, right hip. Patient has severe unremitting pain that affects sleep, daily activities, and work/hobbies. After pre-op clearance the patient was taken to the operating room on 09/24/2020 and underwent  Procedure(s): RIGHT TOTAL HIP ARTHROPLASTY ANTERIOR APPROACH.    Patient was given perioperative antibiotics:  Anti-infectives (From admission, onward)    Start     Dose/Rate Route Frequency Ordered Stop   09/24/20 1800  ceFAZolin (ANCEF) IVPB 1 g/50 mL premix        1 g 100 mL/hr over 30 Minutes Intravenous Every 6 hours 09/24/20 1625 09/24/20 2338   09/24/20 0915  ceFAZolin (ANCEF) IVPB 2g/100 mL premix        2 g 200 mL/hr over 30 Minutes Intravenous On call to O.R. 09/24/20 0907 09/24/20 1153        Patient was given sequential compression devices, early ambulation, and chemoprophylaxis to prevent DVT.  Patient benefited maximally from hospital stay and there were no complications.    Recent vital signs:  Patient Vitals for the past 24 hrs:  BP Temp Temp src Pulse Resp SpO2  09/27/20 0615 138/87 98.7 F (37.1 C) Oral 97 16 95 %  09/26/20 2110 129/87 99.6 F (37.6 C) Oral 95 18 94 %  09/26/20 1334 138/73 99.4 F (37.4 C) Oral 86 19 97 %     Recent laboratory studies:  Recent Labs    09/25/20 0326 09/26/20 0324  WBC 15.5* 14.8*  HGB 12.4 11.8*  HCT 37.8 35.7*  PLT 291 215  NA 136 130*  K 4.0 3.9  CL 106 100  CO2 25 23  BUN 14 19  CREATININE 0.66 0.96  GLUCOSE 135* 127*  CALCIUM 8.5* 8.4*     Discharge Medications:   Allergies as of 09/27/2020       Reactions   Meloxicam Other (See Comments)   BAD DREAMS, SAD THOUGHTS        Medication List     STOP taking these medications    diclofenac 75 MG EC tablet Commonly known as: VOLTAREN   HYDROcodone-acetaminophen 5-325 MG tablet Commonly known as: Norco       TAKE these medications    albuterol (2.5 MG/3ML) 0.083% nebulizer solution Commonly known as: PROVENTIL Take 3 mLs (2.5 mg total) by nebulization every 6 (six) hours as needed for wheezing or shortness of breath. What changed: when to take this   ALPRAZolam 0.5 MG tablet Commonly known as: Xanax Take 1 tablet (0.5 mg total)  by mouth 3 (three) times daily as needed. What changed: reasons to take this   aspirin 81 MG chewable tablet Chew 1 tablet (81 mg total) by mouth 2 (two) times daily.   Breztri Aerosphere 160-9-4.8 MCG/ACT Aero Generic drug: Budeson-Glycopyrrol-Formoterol Inhale 2 puffs into the lungs in the morning and at bedtime.   budesonide-formoterol 160-4.5 MCG/ACT inhaler Commonly known as: SYMBICORT Inhale 2 puffs into the lungs 2 (two) times daily.   fluticasone 50 MCG/ACT nasal spray Commonly known as: FLONASE SPRAY 2 SPRAYS INTO EACH NOSTRIL EVERY DAY   HYDROcodone bit-homatropine 5-1.5 MG/5ML syrup Commonly known as: HYCODAN Take 5 mLs by mouth every 8 (eight) hours as needed for cough.   hydrOXYzine 25 MG tablet Commonly  known as: ATARAX/VISTARIL Take 1 tablet (25 mg total) by mouth 3 (three) times daily as needed for itching.   levothyroxine 50 MCG tablet Commonly known as: SYNTHROID TAKE 1 TABLET BY MOUTH DAILY BEFORE BREAKFAST   meclizine 25 MG tablet Commonly known as: ANTIVERT Take 1 tablet (25 mg total) by mouth 3 (three) times daily as needed for dizziness.   OVER THE COUNTER MEDICATION Apply 1 application topically 2 (two) times daily. Stop pain roll-on   oxyCODONE 5 MG immediate release tablet Commonly known as: Oxy IR/ROXICODONE Take 1-2 tablets (5-10 mg total) by mouth every 6 (six) hours as needed for moderate pain (pain score 4-6).   Propylene Glycol-Glycerin 0.6-0.6 % Soln Place 1 drop into both eyes 2 (two) times daily. Artifical tears   rosuvastatin 5 MG tablet Commonly known as: CRESTOR Take 1 tablet (5 mg total) by mouth daily.   sodium chloride 0.65 % Soln nasal spray Commonly known as: OCEAN Place 1 spray into both nostrils daily.   Spiriva Respimat 2.5 MCG/ACT Aers Generic drug: Tiotropium Bromide Monohydrate Inhale 1 puff into the lungs 2 (two) times daily.   Spiriva Respimat 2.5 MCG/ACT Aers Generic drug: Tiotropium Bromide Monohydrate Inhale 2 puffs into the lungs daily.   tiZANidine 4 MG tablet Commonly known as: ZANAFLEX TAKE 1 TABLET BY MOUTH EVERY 6 HOURS AS NEEDED FOR MUSCLE SPASMS.   verapamil 120 MG CR tablet Commonly known as: CALAN-SR Take 1 tablet (120 mg total) by mouth daily.               Durable Medical Equipment  (From admission, onward)           Start     Ordered   09/24/20 1626  DME 3 n 1  Once        09/24/20 1625   09/24/20 1626  DME Walker rolling  Once       Question Answer Comment  Walker: With 5 Inch Wheels   Patient needs a walker to treat with the following condition Status post total replacement of right hip      09/24/20 1625            Diagnostic Studies: DG Chest 2 View  Result Date: 08/31/2020 CLINICAL  DATA:  Shortness of breath EXAM: CHEST - 2 VIEW COMPARISON:  Chest radiograph 01/06/2018 FINDINGS: The cardiomediastinal silhouette is within normal limits. There is calcified atherosclerotic plaque of the aortic arch. The lungs are hyperinflated with flattening of the diaphragms consistent with the history of COPD. Streaky opacities in the lung bases likely reflect subsegmental atelectasis. There is no focal consolidation or pulmonary edema. There is no pleural effusion or pneumothorax. There is mild compression deformity of a lower thoracic vertebral body, likely T12 as seen on  the prior lumbar spine MRI. There is no acute osseous abnormality. IMPRESSION: 1. No radiographic evidence of acute cardiopulmonary process. 2. Hyperinflation consistent with COPD. Electronically Signed   By: Valetta Mole MD   On: 08/31/2020 16:33   DG Pelvis Portable  Result Date: 09/24/2020 CLINICAL DATA:  Postop. EXAM: PORTABLE PELVIS 1-2 VIEWS COMPARISON:  None. FINDINGS: Right hip arthroplasty in expected alignment. No periprosthetic lucency or fracture. Recent postsurgical change includes air and edema in the soft tissues and lateral skin staples. IMPRESSION: Right hip arthroplasty without immediate postoperative complication. Electronically Signed   By: Keith Rake M.D.   On: 09/24/2020 14:47   DG C-Arm 1-60 Min-No Report  Result Date: 09/24/2020 Fluoroscopy was utilized by the requesting physician.  No radiographic interpretation.   DG HIP OPERATIVE UNILAT W OR W/O PELVIS RIGHT  Result Date: 09/24/2020 CLINICAL DATA:  RIGHT total hip arthroplasty EXAM: OPERATIVE RIGHT HIP (WITH PELVIS IF PERFORMED) 3 VIEWS TECHNIQUE: Fluoroscopic spot image(s) were submitted for interpretation post-operatively. COMPARISON:  MR RIGHT hip 07/29/2020 FLUOROSCOPY TIME:  0 minutes 21.8 seconds Dose: 2.4030 mGy FINDINGS: Images demonstrate placement of a RIGHT hip prosthesis. Bones appear demineralized. No fracture, dislocation, or bone  destruction seen. IMPRESSION: RIGHT hip prosthesis without acute complication. Electronically Signed   By: Lavonia Dana M.D.   On: 09/24/2020 13:51    Disposition: Discharge disposition: 01-Home or Self Care       Discharge Instructions     Call MD / Call 911   Complete by: As directed    If you experience chest pain or shortness of breath, CALL 911 and be transported to the hospital emergency room.  If you develope a fever above 101 F, pus (white drainage) or increased drainage or redness at the wound, or calf pain, call your surgeon's office.   Constipation Prevention   Complete by: As directed    Drink plenty of fluids.  Prune juice may be helpful.  You may use a stool softener, such as Colace (over the counter) 100 mg twice a day.  Use MiraLax (over the counter) for constipation as needed.   Diet - low sodium heart healthy   Complete by: As directed    Discharge instructions   Complete by: As directed    INSTRUCTIONS AFTER JOINT REPLACEMENT   o Remove items at home which could result in a fall. This includes throw rugs or furniture in walking pathways o ICE to the affected joint every three hours while awake for 30 minutes at a time, for at least the first 3-5 days, and then as needed for pain and swelling.  Continue to use ice for pain and swelling. You may notice swelling that will progress down to the foot and ankle.  This is normal after surgery.  Elevate your leg when you are not up walking on it.   o Continue to use the breathing machine you got in the hospital (incentive spirometer) which will help keep your temperature down.  It is common for your temperature to cycle up and down following surgery, especially at night when you are not up moving around and exerting yourself.  The breathing machine keeps your lungs expanded and your temperature down.   DIET:  As you were doing prior to hospitalization, we recommend a well-balanced diet.  DRESSING / WOUND CARE /  SHOWERING  Keep the surgical dressing until follow up.  The dressing is water proof, so you can shower without any extra covering.  IF THE DRESSING  FALLS OFF or the wound gets wet inside, change the dressing with sterile gauze.  Please use good hand washing techniques before changing the dressing.  Do not use any lotions or creams on the incision until instructed by your surgeon.    ACTIVITY  o Increase activity slowly as tolerated, but follow the weight bearing instructions below.   o No driving for 6 weeks or until further direction given by your physician.  You cannot drive while taking narcotics.  o No lifting or carrying greater than 10 lbs. until further directed by your surgeon. o Avoid periods of inactivity such as sitting longer than an hour when not asleep. This helps prevent blood clots.  o You may return to work once you are authorized by your doctor.     WEIGHT BEARING   Weight bearing as tolerated with assist device (walker, cane, etc) as directed, use it as long as suggested by your surgeon or therapist, typically at least 4-6 weeks.   EXERCISES  Results after joint replacement surgery are often greatly improved when you follow the exercise, range of motion and muscle strengthening exercises prescribed by your doctor. Safety measures are also important to protect the joint from further injury. Any time any of these exercises cause you to have increased pain or swelling, decrease what you are doing until you are comfortable again and then slowly increase them. If you have problems or questions, call your caregiver or physical therapist for advice.   Rehabilitation is important following a joint replacement. After just a few days of immobilization, the muscles of the leg can become weakened and shrink (atrophy).  These exercises are designed to build up the tone and strength of the thigh and leg muscles and to improve motion. Often times heat used for twenty to thirty minutes  before working out will loosen up your tissues and help with improving the range of motion but do not use heat for the first two weeks following surgery (sometimes heat can increase post-operative swelling).   These exercises can be done on a training (exercise) mat, on the floor, on a table or on a bed. Use whatever works the best and is most comfortable for you.    Use music or television while you are exercising so that the exercises are a pleasant break in your day. This will make your life better with the exercises acting as a break in your routine that you can look forward to.   Perform all exercises about fifteen times, three times per day or as directed.  You should exercise both the operative leg and the other leg as well.  Exercises include:    Quad Sets - Tighten up the muscle on the front of the thigh (Quad) and hold for 5-10 seconds.    Straight Leg Raises - With your knee straight (if you were given a brace, keep it on), lift the leg to 60 degrees, hold for 3 seconds, and slowly lower the leg.  Perform this exercise against resistance later as your leg gets stronger.   Leg Slides: Lying on your back, slowly slide your foot toward your buttocks, bending your knee up off the floor (only go as far as is comfortable). Then slowly slide your foot back down until your leg is flat on the floor again.   Angel Wings: Lying on your back spread your legs to the side as far apart as you can without causing discomfort.   Hamstring Strength:  Lying on your back,  push your heel against the floor with your leg straight by tightening up the muscles of your buttocks.  Repeat, but this time bend your knee to a comfortable angle, and push your heel against the floor.  You may put a pillow under the heel to make it more comfortable if necessary.   A rehabilitation program following joint replacement surgery can speed recovery and prevent re-injury in the future due to weakened muscles. Contact your doctor  or a physical therapist for more information on knee rehabilitation.    CONSTIPATION  Constipation is defined medically as fewer than three stools per week and severe constipation as less than one stool per week.  Even if you have a regular bowel pattern at home, your normal regimen is likely to be disrupted due to multiple reasons following surgery.  Combination of anesthesia, postoperative narcotics, change in appetite and fluid intake all can affect your bowels.   YOU MUST use at least one of the following options; they are listed in order of increasing strength to get the job done.  They are all available over the counter, and you may need to use some, POSSIBLY even all of these options:    Drink plenty of fluids (prune juice may be helpful) and high fiber foods Colace 100 mg by mouth twice a day  Senokot for constipation as directed and as needed Dulcolax (bisacodyl), take with full glass of water  Miralax (polyethylene glycol) once or twice a day as needed.  If you have tried all these things and are unable to have a bowel movement in the first 3-4 days after surgery call either your surgeon or your primary doctor.    If you experience loose stools or diarrhea, hold the medications until you stool forms back up.  If your symptoms do not get better within 1 week or if they get worse, check with your doctor.  If you experience "the worst abdominal pain ever" or develop nausea or vomiting, please contact the office immediately for further recommendations for treatment.   ITCHING:  If you experience itching with your medications, try taking only a single pain pill, or even half a pain pill at a time.  You can also use Benadryl over the counter for itching or also to help with sleep.   TED HOSE STOCKINGS:  Use stockings on both legs until for at least 2 weeks or as directed by physician office. They may be removed at night for sleeping.  MEDICATIONS:  See your medication summary on the "After  Visit Summary" that nursing will review with you.  You may have some home medications which will be placed on hold until you complete the course of blood thinner medication.  It is important for you to complete the blood thinner medication as prescribed.  PRECAUTIONS:  If you experience chest pain or shortness of breath - call 911 immediately for transfer to the hospital emergency department.   If you develop a fever greater that 101 F, purulent drainage from wound, increased redness or drainage from wound, foul odor from the wound/dressing, or calf pain - CONTACT YOUR SURGEON.                                                   FOLLOW-UP APPOINTMENTS:  If you do not already have a post-op appointment, please call the  office for an appointment to be seen by your surgeon.  Guidelines for how soon to be seen are listed in your "After Visit Summary", but are typically between 1-4 weeks after surgery.  OTHER INSTRUCTIONS:   Knee Replacement:  Do not place pillow under knee, focus on keeping the knee straight while resting. CPM instructions: 0-90 degrees, 2 hours in the morning, 2 hours in the afternoon, and 2 hours in the evening. Place foam block, curve side up under heel at all times except when in CPM or when walking.  DO NOT modify, tear, cut, or change the foam block in any way.  POST-OPERATIVE OPIOID TAPER INSTRUCTIONS:  It is important to wean off of your opioid medication as soon as possible. If you do not need pain medication after your surgery it is ok to stop day one.  Opioids include:  o Codeine, Hydrocodone(Norco, Vicodin), Oxycodone(Percocet, oxycontin) and hydromorphone amongst others.   Long term and even short term use of opiods can cause:  o Increased pain response  o Dependence  o Constipation  o Depression  o Respiratory depression  o And more.   Withdrawal symptoms can include  o Flu like symptoms  o Nausea, vomiting  o And more  Techniques to manage these  symptoms  o Hydrate well  o Eat regular healthy meals  o Stay active  o Use relaxation techniques(deep breathing, meditating, yoga)  Do Not substitute Alcohol to help with tapering  If you have been on opioids for less than two weeks and do not have pain than it is ok to stop all together.   Plan to wean off of opioids  o This plan should start within one week post op of your joint replacement.  o Maintain the same interval or time between taking each dose and first decrease the dose.   o Cut the total daily intake of opioids by one tablet each day  o Next start to increase the time between doses.  o The last dose that should be eliminated is the evening dose.   MAKE SURE YOU:   Understand these instructions.   Get help right away if you are not doing well or get worse.    Thank you for letting us be a part of your medical care team.  It is a privilege we respect greatly.  We hope these instructions will help you stay on track for a fast and full recovery!     Dental Antibiotics:  In most cases prophylactic antibiotics for Dental procdeures after total joint surgery are not necessary.  Exceptions are as follows:  1. History of prior total joint infection  2. Severely immunocompromised (Organ Transplant, cancer chemotherapy, Rheumatoid biologic meds such as [place])  3. Poorly controlled diabetes (A1C &gt; 8.0, blood glucose over 200)  If you have one of these conditions, contact your surgeon for an antibiotic prescription, prior to your dental procedure.   Driving restrictions   Complete by: As directed    No driving for 6 weeks   Increase activity slowly as tolerated   Complete by: As directed    Lifting restrictions   Complete by: As directed    No lifting for 6 weeks   Post-operative opioid taper instructions:   Complete by: As directed    POST-OPERATIVE OPIOID TAPER INSTRUCTIONS: It is important to wean off of your opioid medication as soon as possible. If you  do not need pain medication after your surgery it is ok to stop day  one. Opioids include: Codeine, Hydrocodone(Norco, Vicodin), Oxycodone(Percocet, oxycontin) and hydromorphone amongst others.  Long term and even short term use of opiods can cause: Increased pain response Dependence Constipation Depression Respiratory depression And more.  Withdrawal symptoms can include Flu like symptoms Nausea, vomiting And more Techniques to manage these symptoms Hydrate well Eat regular healthy meals Stay active Use relaxation techniques(deep breathing, meditating, yoga) Do Not substitute Alcohol to help with tapering If you have been on opioids for less than two weeks and do not have pain than it is ok to stop all together.  Plan to wean off of opioids This plan should start within one week post op of your joint replacement. Maintain the same interval or time between taking each dose and first decrease the dose.  Cut the total daily intake of opioids by one tablet each day Next start to increase the time between doses. The last dose that should be eliminated is the evening dose.           Follow-up Information     Mcarthur Rossetti, MD Follow up in 2 week(s).   Specialty: Orthopedic Surgery Contact information: [place] [address] [place], [address] Follow up.   Specialty: [place] Why: to provide home health physical therapy Contact information: [address] [place] [address] [phone]                  Signed: Mcarthur Rossetti 09/27/2020, 7:45 AM

## 2020-09-27 NOTE — Progress Notes (Signed)
Patient ID: Mary Blevins, female   DOB: 16-Oct-1945, 75 y.o.   MRN: [id number] She is awake and alert.  She is motivated and hopes to go home today.  I think that is reasonable.  Her vital signs are stable.  Her H&H is stable.  I did change her right hip dressing and her right hip is stable.  We will put in orders for discharge to her daughter's home and was in [place] today.

## 2020-09-27 NOTE — Progress Notes (Signed)
Physical Therapy Treatment Patient Details Name: Mary Blevins MRN: [id number] DOB: 10/09/1945 Today's Date: 09/27/2020    History of Present Illness Patient is 75 y.o. female s/p Rt THA anterior approach on 09/24/20 with PMH significant for OA, COPD, HLD, HTN, hypothyroidism, oseteopenia.    PT Comments    Pt progressing well today. Incr activity tol. Ready to d/c from PT standpoint with family assist prn   Follow Up Recommendations  Follow surgeon's recommendation for DC plan and follow-up therapies;Home health PT     Equipment Recommendations  Rolling walker with 5" wheels;3in1 (PT)    Recommendations for Other Services       Precautions / Restrictions Precautions Precautions: Fall Restrictions Weight Bearing Restrictions: No RLE Weight Bearing: Weight bearing as tolerated    Mobility  Bed Mobility               General bed mobility comments: in recliner    Transfers Overall transfer level: Needs assistance Equipment used: Rolling walker (2 wheeled) Transfers: Sit to/from Stand Sit to Stand: Min guard;Supervision         General transfer comment: cues for LE management and use of UEs to self assist  Ambulation/Gait Ambulation/Gait assistance: Min guard Gait Distance (Feet): 65 Feet Assistive device: Rolling walker (2 wheeled) Gait Pattern/deviations: Step-to pattern;Decreased weight shift to right;Trunk flexed Gait velocity: decr   General Gait Details: cues for sequence, posture and position from RW.   Stairs   Stairs assistance: Min guard;Supervision Stair Management: One rail Right;One rail Left;Step to pattern;With cane Number of Stairs: 5 (x2) General stair comments: initial cues for sequence and technique. demonstrates good carryover from last session after initial cues. tol 10 steps well, anticipate pt will be abel to go up flight at home with family assist   Wheelchair Mobility    Modified Rankin (Stroke Patients Only)        Balance             Standing balance-Leahy Scale: Fair Standing balance comment: able to static stand without UE support                            Cognition Arousal/Alertness: Awake/alert Behavior During Therapy: WFL for tasks assessed/performed Overall Cognitive Status: Within Functional Limits for tasks assessed                                        Exercises      General Comments        Pertinent Vitals/Pain Pain Assessment: Faces Faces Pain Scale: Hurts little more Pain Location: Rt hip Pain Descriptors / Indicators: Aching;Discomfort Pain Intervention(s): Limited activity within patient's tolerance;Monitored during session;Premedicated before session;Repositioned    Home Living                      Prior Function            PT Goals (current goals can now be found in the care plan section) Acute Rehab PT Goals Patient Stated Goal: get back independence and return home PT Goal Formulation: With patient Time For Goal Achievement: 10/01/20 Potential to Achieve Goals: Good Progress towards PT goals: Progressing toward goals    Frequency    7X/week      PT Plan Current plan remains appropriate    Co-evaluation  AM-PAC PT "6 Clicks" Mobility   Outcome Measure  Help needed turning from your back to your side while in a flat bed without using bedrails?: A Little Help needed moving from lying on your back to sitting on the side of a flat bed without using bedrails?: A Little Help needed moving to and from a bed to a chair (including a wheelchair)?: A Little Help needed standing up from a chair using your arms (e.g., wheelchair or bedside chair)?: A Little Help needed to walk in hospital room?: A Little Help needed climbing 3-5 steps with a railing? : A Little 6 Click Score: 18    End of Session Equipment Utilized During Treatment: Gait belt Activity Tolerance: Patient tolerated treatment  well Patient left: with call bell/phone within reach;in chair;with chair alarm set Nurse Communication: Mobility status PT Visit Diagnosis: Muscle weakness (generalized) (M62.81);Difficulty in walking, not elsewhere classified (R26.2)     Time: 1100-1116 PT Time Calculation (min) (ACUTE ONLY): 16 min  Charges:  $Gait Training: 8-22 mins                     Baxter Flattery, PT  Acute Rehab Dept ([place]) [phone] Pager [phone]  09/27/2020    [facility] 09/27/2020, 11:30 AM

## 2020-09-27 NOTE — Plan of Care (Signed)
  Problem: Pain Management: Goal: Pain level will decrease with appropriate interventions Outcome: Progressing   

## 2020-09-27 NOTE — Plan of Care (Signed)
  Problem: Education: Goal: Knowledge of the prescribed therapeutic regimen will improve Outcome: Adequate for Discharge Goal: Understanding of discharge needs will improve Outcome: Adequate for Discharge Goal: Individualized Educational Video(s) Outcome: Adequate for Discharge   Problem: Activity: Goal: Ability to avoid complications of mobility impairment will improve Outcome: Adequate for Discharge Goal: Ability to tolerate increased activity will improve Outcome: Adequate for Discharge   Problem: Clinical Measurements: Goal: Postoperative complications will be avoided or minimized Outcome: Adequate for Discharge   Problem: Pain Management: Goal: Pain level will decrease with appropriate interventions Outcome: Adequate for Discharge   Problem: Skin Integrity: Goal: Will show signs of wound healing Outcome: Adequate for Discharge   Problem: Education: Goal: Knowledge of General Education information will improve Description: Including pain rating scale, medication(s)/side effects and non-pharmacologic comfort measures Outcome: Adequate for Discharge   Problem: Health Behavior/Discharge Planning: Goal: Ability to manage health-related needs will improve Outcome: Adequate for Discharge   Problem: Clinical Measurements: Goal: Ability to maintain clinical measurements within normal limits will improve Outcome: Adequate for Discharge Goal: Will remain free from infection Outcome: Adequate for Discharge Goal: Diagnostic test results will improve Outcome: Adequate for Discharge Goal: Respiratory complications will improve Outcome: Adequate for Discharge Goal: Cardiovascular complication will be avoided Outcome: Adequate for Discharge   Problem: Activity: Goal: Risk for activity intolerance will decrease Outcome: Adequate for Discharge   Problem: Nutrition: Goal: Adequate nutrition will be maintained Outcome: Adequate for Discharge   Problem: Coping: Goal: Level of  anxiety will decrease Outcome: Adequate for Discharge   Problem: Elimination: Goal: Will not experience complications related to bowel motility Outcome: Adequate for Discharge Goal: Will not experience complications related to urinary retention Outcome: Adequate for Discharge   Problem: Pain Managment: Goal: General experience of comfort will improve Outcome: Adequate for Discharge   Problem: Safety: Goal: Ability to remain free from injury will improve Outcome: Adequate for Discharge   Problem: Skin Integrity: Goal: Risk for impaired skin integrity will decrease Outcome: Adequate for Discharge   Problem: Acute Rehab PT Goals(only PT should resolve) Goal: Pt Will Go Supine/Side To Sit Outcome: Adequate for Discharge Goal: Patient Will Transfer Sit To/From Stand Outcome: Adequate for Discharge Goal: Pt Will Ambulate Outcome: Adequate for Discharge Goal: Pt Will Go Up/Down Stairs Outcome: Adequate for Discharge  Pt discharged with family.  Discharge teaching done.  Written information given.

## 2020-09-28 ENCOUNTER — Other Ambulatory Visit: Payer: Self-pay | Admitting: Orthopaedic Surgery

## 2020-09-28 ENCOUNTER — Encounter (HOSPITAL_COMMUNITY): Payer: Self-pay | Admitting: Orthopaedic Surgery

## 2020-09-28 ENCOUNTER — Telehealth: Payer: Self-pay | Admitting: *Deleted

## 2020-09-28 ENCOUNTER — Telehealth: Payer: Self-pay | Admitting: Orthopaedic Surgery

## 2020-09-28 DIAGNOSIS — Z471 Aftercare following joint replacement surgery: Secondary | ICD-10-CM | POA: Diagnosis not present

## 2020-09-28 DIAGNOSIS — J449 Chronic obstructive pulmonary disease, unspecified: Secondary | ICD-10-CM | POA: Diagnosis not present

## 2020-09-28 DIAGNOSIS — I700 Atherosclerosis of aorta: Secondary | ICD-10-CM | POA: Diagnosis not present

## 2020-09-28 DIAGNOSIS — E039 Hypothyroidism, unspecified: Secondary | ICD-10-CM | POA: Diagnosis not present

## 2020-09-28 DIAGNOSIS — F5104 Psychophysiologic insomnia: Secondary | ICD-10-CM | POA: Diagnosis not present

## 2020-09-28 DIAGNOSIS — I10 Essential (primary) hypertension: Secondary | ICD-10-CM | POA: Diagnosis not present

## 2020-09-28 DIAGNOSIS — K5730 Diverticulosis of large intestine without perforation or abscess without bleeding: Secondary | ICD-10-CM | POA: Diagnosis not present

## 2020-09-28 DIAGNOSIS — F411 Generalized anxiety disorder: Secondary | ICD-10-CM | POA: Diagnosis not present

## 2020-09-28 DIAGNOSIS — E785 Hyperlipidemia, unspecified: Secondary | ICD-10-CM | POA: Diagnosis not present

## 2020-09-28 MED ORDER — HYDROCODONE-ACETAMINOPHEN 5-325 MG PO TABS: 1.0000 | ORAL_TABLET | Freq: Four times a day (QID) | ORAL | 0 refills | Status: DC | PRN

## 2020-09-28 NOTE — Care Plan (Signed)
CM spoke with daughter and patient today. States she is really having a hard time last night and today. Reminded her she is just POD#3-4 and with time, pain, frustration will improve. Confirmed with therapy that they are coming out today to see her at her daughter's home in [place] [facility] [place]. Her daughter is concerned because she is hallucinating and seeing people that aren't there. Pt. Reports taking pain medication 1 tablet every 4-6 hrs. It helps with pain, but the hallucinations are concerning. She is also taking the muscle relaxer as prescribed. Will send message to Dr. Ninfa Linden regarding the pain medication to see if there is any other recommendations. Reminded to drink fluids, continue with getting up and going to the restroom and frequent movements. Compression hose on today per her daughter. Pt. Tearful and thankful for call.

## 2020-09-28 NOTE — Telephone Encounter (Signed)
Ortho bundle D/C call today. Patient taking Oxycodone 1 every 4 hours she states. Helps with the pain, but she is having hallucinations with the medication. Also taking Tizanidine as prescribed. She and her daughter are very concerned about the hallucinations. Any recommendations or changes for this? Thanks.

## 2020-09-28 NOTE — Telephone Encounter (Signed)
That's fine.  Thank you.

## 2020-09-28 NOTE — Telephone Encounter (Signed)
Pt called and states she is in so much pain she is staying with her daughter in [place] to help take care of her. She wasn't able to get up with the physical therapist on Sunday because she was in so much pain. She does not have the number to call her therapist Crystal and was wondering if Jamse Arn could give her a call? The first number is hers and if she doesn't answer she wants to try her daughters number.   CB [phone]-- daughter

## 2020-09-29 ENCOUNTER — Telehealth: Payer: Self-pay | Admitting: *Deleted

## 2020-09-29 ENCOUNTER — Telehealth: Payer: Self-pay | Admitting: Orthopaedic Surgery

## 2020-09-29 DIAGNOSIS — Z471 Aftercare following joint replacement surgery: Secondary | ICD-10-CM | POA: Diagnosis not present

## 2020-09-29 DIAGNOSIS — K5730 Diverticulosis of large intestine without perforation or abscess without bleeding: Secondary | ICD-10-CM | POA: Diagnosis not present

## 2020-09-29 DIAGNOSIS — F5104 Psychophysiologic insomnia: Secondary | ICD-10-CM | POA: Diagnosis not present

## 2020-09-29 DIAGNOSIS — E039 Hypothyroidism, unspecified: Secondary | ICD-10-CM | POA: Diagnosis not present

## 2020-09-29 DIAGNOSIS — I700 Atherosclerosis of aorta: Secondary | ICD-10-CM | POA: Diagnosis not present

## 2020-09-29 DIAGNOSIS — I10 Essential (primary) hypertension: Secondary | ICD-10-CM | POA: Diagnosis not present

## 2020-09-29 DIAGNOSIS — J449 Chronic obstructive pulmonary disease, unspecified: Secondary | ICD-10-CM | POA: Diagnosis not present

## 2020-09-29 DIAGNOSIS — E785 Hyperlipidemia, unspecified: Secondary | ICD-10-CM | POA: Diagnosis not present

## 2020-09-29 DIAGNOSIS — F411 Generalized anxiety disorder: Secondary | ICD-10-CM | POA: Diagnosis not present

## 2020-09-29 NOTE — Telephone Encounter (Signed)
Ortho bundle call: Patient called this morning and left VM saying her dressing needed changing again. Spoke with her therapist with [facility], who confirmed she did change the dressing yesterday because it was "ballooned" out on the dressing and it was saturated with serous fluid. No concern for infection yesterday per therapist. Incision looked great also. Spoke with patient and her daughter and confirmed that patient's dressing is saturated again today. Picture sent via email from daughter to [facility] , who forwarded to MD for his review. He verbalized the dressing needs to just be changed again and he feels this is normal. Reinforced this with patient and family and requested HHPT make another visit today to help with dressing change and continued therapy.

## 2020-09-29 NOTE — Telephone Encounter (Signed)
Pt called stating she had surgery on 09/24/20 and had a home PT visit on 09/28/20, where they changed her wrap. Pt is stating the wrap has bubbled up and needs to be changed again. She also mentioned the nurse gave her an extra wrap yesterday but she will need more. Pt would like a CB to update her who and when someone will be coming.   [phone]

## 2020-09-30 ENCOUNTER — Telehealth: Payer: Self-pay | Admitting: Orthopaedic Surgery

## 2020-09-30 NOTE — Telephone Encounter (Signed)
Patient called advised she have not had a bowel movement since 09/23/2020. Patient want to know what can she take over the counter to help her go? The number to contact patient is [phone]

## 2020-09-30 NOTE — Telephone Encounter (Signed)
Patient would like for me to call daughter to let her know since  daughter is at the pharm.   Daughter aware.

## 2020-09-30 NOTE — Telephone Encounter (Signed)
Marcia # [phone]

## 2020-10-01 ENCOUNTER — Encounter: Payer: Self-pay | Admitting: Surgical

## 2020-10-01 ENCOUNTER — Ambulatory Visit (INDEPENDENT_AMBULATORY_CARE_PROVIDER_SITE_OTHER): Payer: Medicare HMO

## 2020-10-01 ENCOUNTER — Other Ambulatory Visit: Payer: Self-pay

## 2020-10-01 ENCOUNTER — Ambulatory Visit (INDEPENDENT_AMBULATORY_CARE_PROVIDER_SITE_OTHER): Payer: Medicare HMO | Admitting: Surgical

## 2020-10-01 DIAGNOSIS — E785 Hyperlipidemia, unspecified: Secondary | ICD-10-CM | POA: Diagnosis not present

## 2020-10-01 DIAGNOSIS — M1611 Unilateral primary osteoarthritis, right hip: Secondary | ICD-10-CM

## 2020-10-01 DIAGNOSIS — Z96641 Presence of right artificial hip joint: Secondary | ICD-10-CM | POA: Diagnosis not present

## 2020-10-01 DIAGNOSIS — K5730 Diverticulosis of large intestine without perforation or abscess without bleeding: Secondary | ICD-10-CM | POA: Diagnosis not present

## 2020-10-01 DIAGNOSIS — J449 Chronic obstructive pulmonary disease, unspecified: Secondary | ICD-10-CM | POA: Diagnosis not present

## 2020-10-01 DIAGNOSIS — I700 Atherosclerosis of aorta: Secondary | ICD-10-CM | POA: Diagnosis not present

## 2020-10-01 DIAGNOSIS — F5104 Psychophysiologic insomnia: Secondary | ICD-10-CM | POA: Diagnosis not present

## 2020-10-01 DIAGNOSIS — F411 Generalized anxiety disorder: Secondary | ICD-10-CM | POA: Diagnosis not present

## 2020-10-01 DIAGNOSIS — E039 Hypothyroidism, unspecified: Secondary | ICD-10-CM | POA: Diagnosis not present

## 2020-10-01 DIAGNOSIS — Z471 Aftercare following joint replacement surgery: Secondary | ICD-10-CM | POA: Diagnosis not present

## 2020-10-01 DIAGNOSIS — I10 Essential (primary) hypertension: Secondary | ICD-10-CM | POA: Diagnosis not present

## 2020-10-01 MED ORDER — LIDOCAINE HCL 1 % IJ SOLN
5.0000 mL | INTRAMUSCULAR | Status: AC | PRN
  Administered 2020-10-01: 5 mL

## 2020-10-01 NOTE — Progress Notes (Signed)
   Procedure Note  Patient: Mary Blevins             Date of Birth: May 05, 1945           MRN: [id number]             Visit Date: 10/01/2020  Procedures: Visit Diagnoses:  1. Status post hip replacement, right   2. Unilateral primary osteoarthritis, right hip     Large Joint Inj on 10/01/2020 6:03 PM Indications: pain and joint swelling Details: 18 G 3.5 in needle, ultrasound-guided anterolateral approach Medications: 5 mL lidocaine 1 % Aspirate: 40 mL blood-tinged  Patient was prepped with combination of alcohol, Betadine, ChloraPrep.  Sterile technique was used throughout the procedure including sterile gloves.  Patient tolerated procedure well. Procedure, treatment alternatives, risks and benefits explained, specific risks discussed. Immediately prior to procedure a time out was called to verify the correct patient, procedure, equipment, support staff and site/side marked as required. Patient was prepped and draped in the usual sterile fashion.

## 2020-10-01 NOTE — Progress Notes (Signed)
Post-Op Visit Note   Patient: Mary Blevins           Date of Birth: 1945/02/21           MRN: [id number] Visit Date: 10/01/2020 PCP: Mary Frizzle, MD   Assessment & Plan:  Chief Complaint: No chief complaint on file.  Visit Diagnoses:  1. Unilateral primary osteoarthritis, right hip     Plan: Patient is a 75 year old female who presents for evaluation s/p right total hip arthroplasty on 09/24/2020 by Dr. Ninfa Linden.  She presents today with concern of postoperative infection after saturating through 2 Aquacel bandages.  She was instructed by home health physical therapy to reach out.  She denies any systemic symptoms such as fevers, chills, night sweats.  Does report increase in her pain since she discontinued oxycodone in favor of hydrocodone due to the hallucinations she was experiencing on oxycodone.  She is able to ambulate with a walker.  Denies any injury or any falls since the procedure.  Her pain does not seem to be worsening but just having difficulty with pain control in the last several days since switching to hydrocodone.  On exam she has incision that looks to be healing well without any evidence of infection or dehiscence.  There is a large amount of swelling to the lateral aspect of the incision that appears consistent with postoperative hematoma/seroma.  She does have pain with logroll of the extremity but no pain with passive internal rotation or external rotation of the hip when she is sitting upright.  There is no expressible drainage from the incision.  No calf tenderness.  Negative Homans' sign.  Radiographs demonstrate no hip dislocation or any significant change since prior radiographs immediately after the procedure.  Ultrasound examination reveals large fluid collection superficially.  Discussed with Dr. Ninfa Linden and he recommended aspiration with follow-up with him on Monday.  Using ultrasound guidance, 18-gauge spinal needle was placed successfully into the  fluid collection.  Aspirated the fluid collection of 40 cc of sanguinous drainage.  Did not appear purulent.  This was collected out into a wash basin while pressure was applied to the wound.  There is a little bit of drainage that seeped it out through the distal aspect of the incision but no other significant events.  Plan for her to follow-up on Monday with Dr. Ninfa Linden.  Aquacel bandage applied.  A second bandage was given to the patient in case she has another episode of saturation.  She will call the office on-call number with any other concerns.  Follow-Up Instructions: No follow-ups on file.   Orders:  Orders Placed This Encounter  Procedures   XR HIP UNILAT W OR W/O PELVIS 2-3 VIEWS RIGHT   No orders of the defined types were placed in this encounter.   Imaging: No results found.  PMFS History: Patient Active Problem List   Diagnosis Date Noted   Decreased urine output 09/26/2020    Class: Acute   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   Unilateral primary osteoarthritis, right hip 09/23/2020   Leg cramping 01/07/2020   GAD (generalized anxiety disorder) 12/20/2016   Chronic insomnia 12/20/2016   Aortic atherosclerosis ([place]) 06/09/2016   Benign neoplasm of ascending colon    Benign neoplasm of transverse colon    Benign neoplasm of sigmoid colon    Diverticulosis of sigmoid colon    Grade I internal hemorrhoids    PSVT (paroxysmal supraventricular tachycardia) ([place]) 02/17/2015   Back pain  07/22/2012   Acute pain of left hip 07/22/2012   Stricture and stenosis of esophagus 09/18/2011   Hx of colonic polyps 09/18/2011   COPD (chronic obstructive pulmonary disease) ([address]) 07/14/2010   Generalized headaches 07/14/2010   Osteopenia 07/14/2010   Hypothyroid 07/14/2010   Elevated lipids 07/14/2010   Past Medical History:  Diagnosis Date   Arthritis    COPD (chronic obstructive pulmonary disease) ([facility])    Dyspnea    Elevated lipids    Generalized  headaches    History of pneumonia    Hyperlipidemia    Hypertension    Hypothyroidism    MVP (mitral valve prolapse)    palpitations   Osteopenia    Seasonal allergies    Sinusitis    Tubular adenoma of colon     Family History  Problem Relation Age of Onset   Breast cancer Maternal Aunt    Heart attack Maternal Grandmother    Heart attack Mother    Heart disease Mother    Dementia Mother    Diabetes Brother    Prostate cancer Maternal Grandfather    Healthy Father    Healthy Sister    Heart attack Other        NIECE   Colon cancer Neg Hx    Esophageal cancer Neg Hx    Rectal cancer Neg Hx    Stomach cancer Neg Hx     Past Surgical History:  Procedure Laterality Date   CATARACT EXTRACTION Left    COLONOSCOPY WITH PROPOFOL N/A 01/06/2016   Procedure: COLONOSCOPY WITH PROPOFOL;  Surgeon: Doran Stabler, MD;  Location: [facility];  Service: Gastroenterology;  Laterality: N/A;   DILATION AND CURETTAGE OF UTERUS     THYROIDECTOMY     TONSILLECTOMY     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   Procedure: RIGHT TOTAL HIP ARTHROPLASTY ANTERIOR APPROACH;  Surgeon: Mcarthur Rossetti, MD;  Location: [facility];  Service: Orthopedics;  Laterality: Right;  Needs RNFA   Social History   Occupational History    Employer: [employer]  Tobacco Use   Smoking status: Former    Types: Cigarettes    Quit date: 07/24/2005    Years since quitting: 15.2   Smokeless tobacco: Never  Vaping Use   Vaping Use: Never used  Substance and Sexual Activity   Alcohol use: Yes    Comment: rare   Drug use: No   Sexual activity: Not on file

## 2020-10-03 DIAGNOSIS — I82411 Acute embolism and thrombosis of right femoral vein: Secondary | ICD-10-CM | POA: Diagnosis not present

## 2020-10-04 ENCOUNTER — Telehealth: Payer: Self-pay | Admitting: *Deleted

## 2020-10-04 ENCOUNTER — Ambulatory Visit (INDEPENDENT_AMBULATORY_CARE_PROVIDER_SITE_OTHER): Payer: Medicare HMO | Admitting: Orthopaedic Surgery

## 2020-10-04 ENCOUNTER — Other Ambulatory Visit: Payer: Self-pay

## 2020-10-04 DIAGNOSIS — Z96641 Presence of right artificial hip joint: Secondary | ICD-10-CM

## 2020-10-04 NOTE — Telephone Encounter (Signed)
Ortho bundle 7 day in office visit today. Will continue to follow along for needs.

## 2020-10-04 NOTE — Progress Notes (Signed)
The patient is being seen today again in follow-up status post a right total hip arthroplasty was done under 2 weeks ago.  She saw one of the physician assistants this past Friday who drained a seroma off her hip.  She has been staying with family in [place].  She was seen at [facility] last night and found to have a DVT so she is now on Eliquis.  The family is concerned and feels like she needs to be in a rehab or skilled nursing center.  She is 75 years old and seems to be mobilizing okay.  There has been definitely swelling in her right foot and ankle and leg in general.  The right hip incision looks good.  There is no evidence of infection at all.  I was able to aspirate another 60 cc of seroma from the soft tissue.  There is no evidence of wound breakdown.  The staples are intact but is too early to remove the staples.  New dressing was applied and we have tried to give reassurance that she needs to continue her mobility.  It may be hard to get her in a nursing home at this standpoint though she would need to be readmitted to the hospital but she is already on appropriate dosing for her blood thinning medication as well.  Home health is coming to see her and we will try to figure out how best to get her mobilizing without having her be put in a nursing home.  Also unfortunately, she can only get her on Fridays so we will see if I can get my partner Dr. Erlinda Hong to see her Friday morning or loop to see her Friday afternoon in order to remove the staples and place Steri-Strips and even drain more seroma if needed.  I assume she will likely reaccumulate some of the fluid given that she is on Eliquis.

## 2020-10-05 DIAGNOSIS — F5104 Psychophysiologic insomnia: Secondary | ICD-10-CM | POA: Diagnosis not present

## 2020-10-05 DIAGNOSIS — I10 Essential (primary) hypertension: Secondary | ICD-10-CM | POA: Diagnosis not present

## 2020-10-05 DIAGNOSIS — E039 Hypothyroidism, unspecified: Secondary | ICD-10-CM | POA: Diagnosis not present

## 2020-10-05 DIAGNOSIS — J449 Chronic obstructive pulmonary disease, unspecified: Secondary | ICD-10-CM | POA: Diagnosis not present

## 2020-10-05 DIAGNOSIS — Z471 Aftercare following joint replacement surgery: Secondary | ICD-10-CM | POA: Diagnosis not present

## 2020-10-05 DIAGNOSIS — F411 Generalized anxiety disorder: Secondary | ICD-10-CM | POA: Diagnosis not present

## 2020-10-05 DIAGNOSIS — I700 Atherosclerosis of aorta: Secondary | ICD-10-CM | POA: Diagnosis not present

## 2020-10-05 DIAGNOSIS — E785 Hyperlipidemia, unspecified: Secondary | ICD-10-CM | POA: Diagnosis not present

## 2020-10-05 DIAGNOSIS — K5730 Diverticulosis of large intestine without perforation or abscess without bleeding: Secondary | ICD-10-CM | POA: Diagnosis not present

## 2020-10-06 DIAGNOSIS — E785 Hyperlipidemia, unspecified: Secondary | ICD-10-CM | POA: Diagnosis not present

## 2020-10-06 DIAGNOSIS — J449 Chronic obstructive pulmonary disease, unspecified: Secondary | ICD-10-CM | POA: Diagnosis not present

## 2020-10-06 DIAGNOSIS — I700 Atherosclerosis of aorta: Secondary | ICD-10-CM | POA: Diagnosis not present

## 2020-10-06 DIAGNOSIS — Z471 Aftercare following joint replacement surgery: Secondary | ICD-10-CM | POA: Diagnosis not present

## 2020-10-06 DIAGNOSIS — F5104 Psychophysiologic insomnia: Secondary | ICD-10-CM | POA: Diagnosis not present

## 2020-10-06 DIAGNOSIS — F411 Generalized anxiety disorder: Secondary | ICD-10-CM | POA: Diagnosis not present

## 2020-10-06 DIAGNOSIS — I10 Essential (primary) hypertension: Secondary | ICD-10-CM | POA: Diagnosis not present

## 2020-10-06 DIAGNOSIS — K5730 Diverticulosis of large intestine without perforation or abscess without bleeding: Secondary | ICD-10-CM | POA: Diagnosis not present

## 2020-10-06 DIAGNOSIS — E039 Hypothyroidism, unspecified: Secondary | ICD-10-CM | POA: Diagnosis not present

## 2020-10-07 ENCOUNTER — Encounter: Payer: Medicare HMO | Admitting: Orthopaedic Surgery

## 2020-10-08 ENCOUNTER — Encounter: Payer: Self-pay | Admitting: Orthopaedic Surgery

## 2020-10-08 ENCOUNTER — Other Ambulatory Visit: Payer: Self-pay

## 2020-10-08 ENCOUNTER — Ambulatory Visit (INDEPENDENT_AMBULATORY_CARE_PROVIDER_SITE_OTHER): Payer: Medicare HMO | Admitting: Orthopaedic Surgery

## 2020-10-08 ENCOUNTER — Telehealth: Payer: Self-pay | Admitting: *Deleted

## 2020-10-08 DIAGNOSIS — Z471 Aftercare following joint replacement surgery: Secondary | ICD-10-CM | POA: Diagnosis not present

## 2020-10-08 DIAGNOSIS — F5104 Psychophysiologic insomnia: Secondary | ICD-10-CM | POA: Diagnosis not present

## 2020-10-08 DIAGNOSIS — I10 Essential (primary) hypertension: Secondary | ICD-10-CM | POA: Diagnosis not present

## 2020-10-08 DIAGNOSIS — Z96641 Presence of right artificial hip joint: Secondary | ICD-10-CM

## 2020-10-08 DIAGNOSIS — I700 Atherosclerosis of aorta: Secondary | ICD-10-CM | POA: Diagnosis not present

## 2020-10-08 DIAGNOSIS — K5730 Diverticulosis of large intestine without perforation or abscess without bleeding: Secondary | ICD-10-CM | POA: Diagnosis not present

## 2020-10-08 DIAGNOSIS — E039 Hypothyroidism, unspecified: Secondary | ICD-10-CM | POA: Diagnosis not present

## 2020-10-08 DIAGNOSIS — J449 Chronic obstructive pulmonary disease, unspecified: Secondary | ICD-10-CM | POA: Diagnosis not present

## 2020-10-08 DIAGNOSIS — F411 Generalized anxiety disorder: Secondary | ICD-10-CM | POA: Diagnosis not present

## 2020-10-08 DIAGNOSIS — E785 Hyperlipidemia, unspecified: Secondary | ICD-10-CM | POA: Diagnosis not present

## 2020-10-08 MED ORDER — HYDROCODONE-ACETAMINOPHEN 5-325 MG PO TABS: 1.0000 | ORAL_TABLET | Freq: Four times a day (QID) | ORAL | 0 refills | Status: DC | PRN

## 2020-10-08 MED ORDER — SULFAMETHOXAZOLE-TRIMETHOPRIM 800-160 MG PO TABS: 1.0000 | ORAL_TABLET | Freq: Two times a day (BID) | ORAL | 0 refills | Status: DC

## 2020-10-08 NOTE — Progress Notes (Signed)
Post-Op Visit Note   Patient: Mary Blevins           Date of Birth: 1945-09-18           MRN: [id number] Visit Date: 10/08/2020 PCP: [name], MD   Assessment & Plan:  Chief Complaint:  Chief Complaint  Patient presents with   Right Hip - Pain   Visit Diagnoses:  1. Status post total replacement of right hip     Plan: Km. is 2-week status post right total hip replacement by Dr. Ninfa Linden.  She comes in today for right hip pain.  Requesting hydrocodone refill.  Sounds like she is progressing better with physical therapy.  Denies any constitutional symptoms.  Reports continued swelling in the right hip region.  Examination of the right hip shows intact staples.  Small area of the incision that slightly macerated.  Feels like there is a small seroma.  Does not look like there is any infection.  Based on her symptoms do not feel that the seroma is what is symptomatic so I recommended not aspirating this.  I would like to put her on 10 days of Bactrim since the incision has 1 little area that is macerated.  Overall sounds like Charnea has a lot of issues with pain which has slow down her recovery.  She asked for a pain shot after staple removal but with her being on Eliquis a Toradol injection is not appropriate.  She will take hydrocodone when she gets home.  She should follow-up with Dr. Ninfa Linden in 2 weeks.  Follow-Up Instructions: Return in about 2 weeks (around 10/22/2020) for with Dr. Ninfa Linden.   Orders:  No orders of the defined types were placed in this encounter.  Meds ordered this encounter  Medications   DISCONTD: sulfamethoxazole-trimethoprim (BACTRIM DS) 800-160 MG tablet    Sig: Take 1 tablet by mouth 2 (two) times daily.    Dispense:  20 tablet    Refill:  0   sulfamethoxazole-trimethoprim (BACTRIM DS) 800-160 MG tablet    Sig: Take 1 tablet by mouth 2 (two) times daily.    Dispense:  20 tablet    Refill:  0   HYDROcodone-acetaminophen  (NORCO/VICODIN) 5-325 MG tablet    Sig: Take 1 tablet by mouth every 6 (six) hours as needed for moderate pain.    Dispense:  30 tablet    Refill:  0    Imaging: No results found.  PMFS History: Patient Active Problem List   Diagnosis Date Noted   Decreased urine output 09/26/2020    Class: Acute   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   Unilateral primary osteoarthritis, right hip 09/23/2020   Leg cramping 01/07/2020   GAD (generalized anxiety disorder) 12/20/2016   Chronic insomnia 12/20/2016   Aortic atherosclerosis ([place]) 06/09/2016   Benign neoplasm of ascending colon    Benign neoplasm of transverse colon    Benign neoplasm of sigmoid colon    Diverticulosis of sigmoid colon    Grade I internal hemorrhoids    PSVT (paroxysmal supraventricular tachycardia) ([place]) 02/17/2015   Back pain 07/22/2012   Acute pain of left hip 07/22/2012   Stricture and stenosis of esophagus 09/18/2011   Hx of colonic polyps 09/18/2011   COPD (chronic obstructive pulmonary disease) ([place]) 07/14/2010   Generalized headaches 07/14/2010   Osteopenia 07/14/2010   Hypothyroid 07/14/2010   Elevated lipids 07/14/2010   Past Medical History:  Diagnosis Date   Arthritis    COPD (  chronic obstructive pulmonary disease) (HCC)    Dyspnea    Elevated lipids    Generalized headaches    History of pneumonia    Hyperlipidemia    Hypertension    Hypothyroidism    MVP (mitral valve prolapse)    palpitations   Osteopenia    Seasonal allergies    Sinusitis    Tubular adenoma of colon     Family History  Problem Relation Age of Onset   Breast cancer Maternal Aunt    Heart attack Maternal Grandmother    Heart attack Mother    Heart disease Mother    Dementia Mother    Diabetes Brother    Prostate cancer Maternal Grandfather    Healthy Father    Healthy Sister    Heart attack Other        NIECE   Colon cancer Neg Hx    Esophageal cancer Neg Hx    Rectal cancer Neg Hx     Stomach cancer Neg Hx     Past Surgical History:  Procedure Laterality Date   CATARACT EXTRACTION Left    COLONOSCOPY WITH PROPOFOL N/A 01/06/2016   Procedure: COLONOSCOPY WITH PROPOFOL;  Surgeon: Doran Stabler, MD;  Location: [facility];  Service: Gastroenterology;  Laterality: N/A;   DILATION AND CURETTAGE OF UTERUS     THYROIDECTOMY     TONSILLECTOMY     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   Procedure: RIGHT TOTAL HIP ARTHROPLASTY ANTERIOR APPROACH;  Surgeon: Mcarthur Rossetti, MD;  Location: [facility];  Service: Orthopedics;  Laterality: Right;  Needs RNFA   Social History   Occupational History    Employer: [employer]  Tobacco Use   Smoking status: Former    Types: Cigarettes    Quit date: 07/24/2005    Years since quitting: 15.2   Smokeless tobacco: Never  Vaping Use   Vaping Use: Never used  Substance and Sexual Activity   Alcohol use: Yes    Comment: rare   Drug use: No   Sexual activity: Not on file

## 2020-10-08 NOTE — Telephone Encounter (Signed)
14 day in office visit completed.

## 2020-10-11 ENCOUNTER — Telehealth: Payer: Self-pay | Admitting: *Deleted

## 2020-10-11 ENCOUNTER — Telehealth: Payer: Self-pay | Admitting: Family Medicine

## 2020-10-11 NOTE — Telephone Encounter (Signed)
Patient left voicemail message to request refill of "green anti-itch pills" previously prescribed for possible reaction to Antibiotic sulfamethoxaz; patient itching.   Please advise at [phone].

## 2020-10-11 NOTE — Telephone Encounter (Signed)
Ortho bundle call completed. 

## 2020-10-12 DIAGNOSIS — E785 Hyperlipidemia, unspecified: Secondary | ICD-10-CM | POA: Diagnosis not present

## 2020-10-12 DIAGNOSIS — Z471 Aftercare following joint replacement surgery: Secondary | ICD-10-CM | POA: Diagnosis not present

## 2020-10-12 DIAGNOSIS — I10 Essential (primary) hypertension: Secondary | ICD-10-CM | POA: Diagnosis not present

## 2020-10-12 DIAGNOSIS — F5104 Psychophysiologic insomnia: Secondary | ICD-10-CM | POA: Diagnosis not present

## 2020-10-12 DIAGNOSIS — K5730 Diverticulosis of large intestine without perforation or abscess without bleeding: Secondary | ICD-10-CM | POA: Diagnosis not present

## 2020-10-12 DIAGNOSIS — E039 Hypothyroidism, unspecified: Secondary | ICD-10-CM | POA: Diagnosis not present

## 2020-10-12 DIAGNOSIS — I700 Atherosclerosis of aorta: Secondary | ICD-10-CM | POA: Diagnosis not present

## 2020-10-12 DIAGNOSIS — J449 Chronic obstructive pulmonary disease, unspecified: Secondary | ICD-10-CM | POA: Diagnosis not present

## 2020-10-12 DIAGNOSIS — F411 Generalized anxiety disorder: Secondary | ICD-10-CM | POA: Diagnosis not present

## 2020-10-13 MED ORDER — HYDROXYZINE HCL 25 MG PO TABS: 25.0000 mg | ORAL_TABLET | Freq: Three times a day (TID) | ORAL | 6 refills | Status: DC | PRN

## 2020-10-13 NOTE — Telephone Encounter (Signed)
Hydroxyzine refilled

## 2020-10-14 DIAGNOSIS — I10 Essential (primary) hypertension: Secondary | ICD-10-CM | POA: Diagnosis not present

## 2020-10-14 DIAGNOSIS — E785 Hyperlipidemia, unspecified: Secondary | ICD-10-CM | POA: Diagnosis not present

## 2020-10-14 DIAGNOSIS — E039 Hypothyroidism, unspecified: Secondary | ICD-10-CM | POA: Diagnosis not present

## 2020-10-14 DIAGNOSIS — I700 Atherosclerosis of aorta: Secondary | ICD-10-CM | POA: Diagnosis not present

## 2020-10-14 DIAGNOSIS — Z471 Aftercare following joint replacement surgery: Secondary | ICD-10-CM | POA: Diagnosis not present

## 2020-10-14 DIAGNOSIS — K5730 Diverticulosis of large intestine without perforation or abscess without bleeding: Secondary | ICD-10-CM | POA: Diagnosis not present

## 2020-10-14 DIAGNOSIS — F5104 Psychophysiologic insomnia: Secondary | ICD-10-CM | POA: Diagnosis not present

## 2020-10-14 DIAGNOSIS — J449 Chronic obstructive pulmonary disease, unspecified: Secondary | ICD-10-CM | POA: Diagnosis not present

## 2020-10-14 DIAGNOSIS — F411 Generalized anxiety disorder: Secondary | ICD-10-CM | POA: Diagnosis not present

## 2020-10-18 ENCOUNTER — Telehealth: Payer: Self-pay | Admitting: Orthopaedic Surgery

## 2020-10-18 ENCOUNTER — Ambulatory Visit: Payer: Medicare HMO | Admitting: Orthopaedic Surgery

## 2020-10-18 NOTE — Telephone Encounter (Signed)
Patient called. She would like to know what to wash the surgical area with? Her call back number is [phone]

## 2020-10-18 NOTE — Telephone Encounter (Signed)
Please advise 

## 2020-10-18 NOTE — Telephone Encounter (Signed)
Called and advised. Pt stated understanding  °

## 2020-10-19 DIAGNOSIS — K5730 Diverticulosis of large intestine without perforation or abscess without bleeding: Secondary | ICD-10-CM | POA: Diagnosis not present

## 2020-10-19 DIAGNOSIS — I700 Atherosclerosis of aorta: Secondary | ICD-10-CM | POA: Diagnosis not present

## 2020-10-19 DIAGNOSIS — F411 Generalized anxiety disorder: Secondary | ICD-10-CM | POA: Diagnosis not present

## 2020-10-19 DIAGNOSIS — I10 Essential (primary) hypertension: Secondary | ICD-10-CM | POA: Diagnosis not present

## 2020-10-19 DIAGNOSIS — Z471 Aftercare following joint replacement surgery: Secondary | ICD-10-CM | POA: Diagnosis not present

## 2020-10-19 DIAGNOSIS — E039 Hypothyroidism, unspecified: Secondary | ICD-10-CM | POA: Diagnosis not present

## 2020-10-19 DIAGNOSIS — J449 Chronic obstructive pulmonary disease, unspecified: Secondary | ICD-10-CM | POA: Diagnosis not present

## 2020-10-19 DIAGNOSIS — F5104 Psychophysiologic insomnia: Secondary | ICD-10-CM | POA: Diagnosis not present

## 2020-10-19 DIAGNOSIS — E785 Hyperlipidemia, unspecified: Secondary | ICD-10-CM | POA: Diagnosis not present

## 2020-10-20 DIAGNOSIS — J449 Chronic obstructive pulmonary disease, unspecified: Secondary | ICD-10-CM | POA: Diagnosis not present

## 2020-10-21 ENCOUNTER — Other Ambulatory Visit: Payer: Self-pay

## 2020-10-21 ENCOUNTER — Encounter: Payer: Self-pay | Admitting: Orthopaedic Surgery

## 2020-10-21 ENCOUNTER — Ambulatory Visit (INDEPENDENT_AMBULATORY_CARE_PROVIDER_SITE_OTHER): Payer: Medicare HMO | Admitting: Orthopaedic Surgery

## 2020-10-21 DIAGNOSIS — E785 Hyperlipidemia, unspecified: Secondary | ICD-10-CM | POA: Diagnosis not present

## 2020-10-21 DIAGNOSIS — Z96641 Presence of right artificial hip joint: Secondary | ICD-10-CM

## 2020-10-21 DIAGNOSIS — I700 Atherosclerosis of aorta: Secondary | ICD-10-CM | POA: Diagnosis not present

## 2020-10-21 DIAGNOSIS — J449 Chronic obstructive pulmonary disease, unspecified: Secondary | ICD-10-CM | POA: Diagnosis not present

## 2020-10-21 DIAGNOSIS — F5104 Psychophysiologic insomnia: Secondary | ICD-10-CM | POA: Diagnosis not present

## 2020-10-21 DIAGNOSIS — Z471 Aftercare following joint replacement surgery: Secondary | ICD-10-CM | POA: Diagnosis not present

## 2020-10-21 DIAGNOSIS — F411 Generalized anxiety disorder: Secondary | ICD-10-CM | POA: Diagnosis not present

## 2020-10-21 DIAGNOSIS — I10 Essential (primary) hypertension: Secondary | ICD-10-CM | POA: Diagnosis not present

## 2020-10-21 DIAGNOSIS — E039 Hypothyroidism, unspecified: Secondary | ICD-10-CM | POA: Diagnosis not present

## 2020-10-21 DIAGNOSIS — K5730 Diverticulosis of large intestine without perforation or abscess without bleeding: Secondary | ICD-10-CM | POA: Diagnosis not present

## 2020-10-21 MED ORDER — HYDROCODONE-ACETAMINOPHEN 5-325 MG PO TABS: 1.0000 | ORAL_TABLET | Freq: Four times a day (QID) | ORAL | 0 refills | Status: DC | PRN

## 2020-10-21 NOTE — Progress Notes (Signed)
The patient is a 4 weeks tomorrow status post a right total hip arthroplasty.  She is now back at home having stayed with family in [place].  She is back in [place].  She is ambulate with a walker.  She has been released from home therapy.  Her right hip incision looks much better.  There is still some swelling to be expected but there is no evidence of infection.  The wound looks good overall.  She does have subjective numbness around the area which is to be expected.  Her leg lengths feel equal.  She can stop her baby aspirin at this point since she was normal before surgery.  I will refill her hydrocodone.  She will continue to increase her activities as comfort allows.  We did talk about stool softeners and recommendations for that.  We will see her back in 4 weeks to see how she is doing overall but no x-rays are needed.

## 2020-10-25 ENCOUNTER — Telehealth: Payer: Self-pay | Admitting: *Deleted

## 2020-10-25 ENCOUNTER — Other Ambulatory Visit: Payer: Self-pay | Admitting: Family Medicine

## 2020-10-25 NOTE — Telephone Encounter (Signed)
Ortho bundle 30 day call completed. °

## 2020-10-27 ENCOUNTER — Other Ambulatory Visit: Payer: Self-pay | Admitting: Orthopaedic Surgery

## 2020-10-27 ENCOUNTER — Telehealth: Payer: Self-pay | Admitting: *Deleted

## 2020-10-27 MED ORDER — APIXABAN 5 MG PO TABS: 5.0000 mg | ORAL_TABLET | Freq: Two times a day (BID) | ORAL | 3 refills | Status: DC

## 2020-10-27 NOTE — Telephone Encounter (Signed)
Patient called asking me about her Eliquis. She saw you last week and you discussed her Aspirin, but not the Eliquis. She was started on this at [facility] on 10/03/20, when she was seen in ED and diagnosed with DVT. I pulled up her Care Everywhere and Rx says 9/11-10/8/22. It was a dose pack. Should she stop on 10/30/20 or does she need to continue with this for the confirmed DVT? If so, she'll need a new Rx since it was prescribed by ED at [facility]. Thanks.

## 2020-10-27 NOTE — Telephone Encounter (Signed)
Patient called and updated that Eliquis has been sent to her pharmacy and reviewed instructions by Dr. Ninfa Linden.

## 2020-11-01 ENCOUNTER — Telehealth: Payer: Self-pay | Admitting: Orthopaedic Surgery

## 2020-11-01 DIAGNOSIS — M545 Low back pain, unspecified: Secondary | ICD-10-CM

## 2020-11-01 NOTE — Telephone Encounter (Signed)
Pt called back stating it was Dr.Wong who did her injection and her the number to his office is : [phone]

## 2020-11-01 NOTE — Telephone Encounter (Signed)
Pt states  that she I doing great about hip replacement but she is starting to experience some back pain that shoots from hip to back. Is that normal?   Cb [phone]

## 2020-11-03 NOTE — Addendum Note (Signed)
Addended by: Robyne Peers on: 11/03/2020 08:44 AM   Modules accepted: Orders

## 2020-11-03 NOTE — Telephone Encounter (Signed)
done

## 2020-11-09 DIAGNOSIS — M791 Myalgia, unspecified site: Secondary | ICD-10-CM | POA: Diagnosis not present

## 2020-11-09 DIAGNOSIS — M47816 Spondylosis without myelopathy or radiculopathy, lumbar region: Secondary | ICD-10-CM | POA: Diagnosis not present

## 2020-11-11 ENCOUNTER — Other Ambulatory Visit: Payer: Self-pay | Admitting: Orthopaedic Surgery

## 2020-11-11 ENCOUNTER — Telehealth: Payer: Self-pay | Admitting: Orthopaedic Surgery

## 2020-11-11 ENCOUNTER — Other Ambulatory Visit: Payer: Self-pay | Admitting: Family Medicine

## 2020-11-11 MED ORDER — HYDROCODONE-ACETAMINOPHEN 5-325 MG PO TABS: 1.0000 | ORAL_TABLET | Freq: Four times a day (QID) | ORAL | 0 refills | Status: DC | PRN

## 2020-11-11 NOTE — Telephone Encounter (Signed)
Patient called. She would like a refill on hydrocodone called in to  [id number] - [place], [place]

## 2020-11-15 ENCOUNTER — Telehealth: Payer: Self-pay | Admitting: Family Medicine

## 2020-11-15 MED ORDER — SPIRIVA RESPIMAT 2.5 MCG/ACT IN AERS: 1.0000 | INHALATION_SPRAY | Freq: Every day | RESPIRATORY_TRACT | 1 refills | Status: DC

## 2020-11-15 NOTE — Telephone Encounter (Signed)
Prescription sent to pharmacy.

## 2020-11-15 NOTE — Telephone Encounter (Signed)
Patient left voicemail message to follow up on pharmacy's refill request for [place] 2.5 MCG/ACT AERS  Patient stated pharmacy needs provider to call it in.  Please advise t [phone]

## 2020-11-16 ENCOUNTER — Telehealth: Payer: Self-pay | Admitting: Family Medicine

## 2020-11-16 NOTE — Chronic Care Management (AMB) (Signed)
  Chronic Care Management   Outreach Note  11/16/2020 Name: AUDELIA KNAPE MRN: [id number] DOB: 06-02-1945  Referred by: [name], MD Reason for referral : No chief complaint on file.   An unsuccessful telephone outreach was attempted today. The patient was referred to the pharmacist for assistance with care management and care coordination.   Follow Up Plan:   Tatjana Dellinger Upstream Scheduler

## 2020-11-17 ENCOUNTER — Telehealth: Payer: Self-pay | Admitting: Family Medicine

## 2020-11-17 NOTE — Progress Notes (Signed)
  Chronic Care Management   Note  11/17/2020 Name: Mary Blevins MRN: [id number] DOB: [birth year]  Mary Blevins is a 75 y.o. year old female who is a primary care patient of Dennard Schaumann, Cammie Mcgee, MD. I reached out to Mary Blevins by phone today in response to a referral sent by Ms. Kerrie Buffalo Lenger's PCP, [name], MD.   [name] was given information about Chronic Care Management services today including:  CCM service includes personalized support from designated clinical staff supervised by her physician, including individualized plan of care and coordination with other care providers 24/7 contact phone numbers for assistance for urgent and routine care needs. Service will only be billed when office clinical staff spend 20 minutes or more in a month to coordinate care. Only one practitioner may furnish and bill the service in a calendar month. The patient may stop CCM services at any time (effective at the end of the month) by phone call to the office staff.   Patient agreed to services and verbal consent obtained.   Follow up plan:   Tatjana Secretary/administrator

## 2020-11-18 ENCOUNTER — Ambulatory Visit (INDEPENDENT_AMBULATORY_CARE_PROVIDER_SITE_OTHER): Payer: Medicare HMO | Admitting: *Deleted

## 2020-11-18 ENCOUNTER — Ambulatory Visit (INDEPENDENT_AMBULATORY_CARE_PROVIDER_SITE_OTHER): Payer: Medicare HMO | Admitting: Orthopaedic Surgery

## 2020-11-18 ENCOUNTER — Encounter: Payer: Self-pay | Admitting: Orthopaedic Surgery

## 2020-11-18 ENCOUNTER — Other Ambulatory Visit: Payer: Self-pay

## 2020-11-18 ENCOUNTER — Telehealth: Payer: Self-pay | Admitting: Pulmonary Disease

## 2020-11-18 DIAGNOSIS — Z23 Encounter for immunization: Secondary | ICD-10-CM

## 2020-11-18 DIAGNOSIS — Z96641 Presence of right artificial hip joint: Secondary | ICD-10-CM

## 2020-11-18 MED ORDER — SPIRIVA RESPIMAT 2.5 MCG/ACT IN AERS: 2.0000 | INHALATION_SPRAY | Freq: Every day | RESPIRATORY_TRACT | 0 refills | Status: DC

## 2020-11-18 NOTE — Telephone Encounter (Signed)
Spoke to patient and let her know we have samples of spiriva we can give her. Let her know that her samples will be up at the front ready for pick up. Nothing further needed.

## 2020-11-18 NOTE — Progress Notes (Signed)
The patient is now 7-week status post a right total hip arthroplasty.  She is ambulate with a walker and doing great.  She has been having some back issues recently.  She is 75 years old.  She feels like she is ready to drive.  Her postoperative course was complicated by a DVT.  She is on Eliquis.  Both calfs are soft.  Her right operative hip moves smoothly and fluidly and incisions healed nicely and there is no swelling at all.  We will see her back in 4 weeks from now to see how she is doing overall.  At that visit we will end up probably ordering repeat Dopplers of her lower extremities on both sides to assess if there is resolution of her DVT.

## 2020-11-19 DIAGNOSIS — J449 Chronic obstructive pulmonary disease, unspecified: Secondary | ICD-10-CM | POA: Diagnosis not present

## 2020-11-22 ENCOUNTER — Other Ambulatory Visit: Payer: Self-pay | Admitting: Family Medicine

## 2020-11-24 ENCOUNTER — Other Ambulatory Visit: Payer: Self-pay | Admitting: Family Medicine

## 2020-11-25 ENCOUNTER — Telehealth: Payer: Self-pay | Admitting: *Deleted

## 2020-11-25 NOTE — Telephone Encounter (Signed)
Ortho bundle call from patient.

## 2020-11-26 ENCOUNTER — Telehealth: Payer: Self-pay | Admitting: Orthopaedic Surgery

## 2020-11-26 ENCOUNTER — Telehealth: Payer: Self-pay | Admitting: *Deleted

## 2020-11-26 NOTE — Telephone Encounter (Signed)
Received call from patient.   States that she was seen in July 2022 for finger numbness on R hand. States that she was referred to neurology, but deferred appointment.   States that numbness and tingling has worsened and she has scheduled appointment with [facility] for next month.   Inquired if there is any treatment she can do until appointment with [facility].   Please advise.

## 2020-11-26 NOTE — Telephone Encounter (Signed)
LMOm for patient letting her know I returned her call

## 2020-11-26 NOTE — Telephone Encounter (Signed)
Pt called requesting a call back. Pt has some medical questions. Please call pt at [phone]

## 2020-11-29 NOTE — Telephone Encounter (Signed)
Call placed to patient and patient made aware.  

## 2020-12-04 ENCOUNTER — Other Ambulatory Visit: Payer: Self-pay | Admitting: Family Medicine

## 2020-12-06 NOTE — Telephone Encounter (Signed)
Ok to refill 

## 2020-12-13 ENCOUNTER — Telehealth: Payer: Self-pay

## 2020-12-13 NOTE — Telephone Encounter (Signed)
[facility] We have samples of Breztri available, can leave 2 at front desk. Please confirm this is what pt needs.

## 2020-12-13 NOTE — Telephone Encounter (Signed)
Pt called in asking if the office had any samples of (BREZTRI AEROSPHERE) . Pt would like a call back if we do.  Cb#: [phone]

## 2020-12-13 NOTE — Telephone Encounter (Signed)
Spoke with pt and she advises she needs samples of Spiriva. Pt states she is not on Breztri as "this didn't work for her". Advised pt I will change her current med list to reflect this. Sample of Spiriva Respimat 2.32mcg left at front desk. Pt will come by to pick up.

## 2020-12-15 ENCOUNTER — Other Ambulatory Visit: Payer: Self-pay

## 2020-12-15 ENCOUNTER — Encounter: Payer: Self-pay | Admitting: Orthopaedic Surgery

## 2020-12-15 ENCOUNTER — Ambulatory Visit (INDEPENDENT_AMBULATORY_CARE_PROVIDER_SITE_OTHER): Payer: Medicare HMO | Admitting: Orthopaedic Surgery

## 2020-12-15 DIAGNOSIS — Z96641 Presence of right artificial hip joint: Secondary | ICD-10-CM

## 2020-12-15 MED ORDER — HYDROCODONE-ACETAMINOPHEN 5-325 MG PO TABS: 1.0000 | ORAL_TABLET | Freq: Four times a day (QID) | ORAL | 0 refills | Status: DC | PRN

## 2020-12-15 NOTE — Progress Notes (Signed)
HPI: [name] returns today 11 weeks status post right total hip arthroplasty.  She is overall doing well.  She feels that her range of motion and strength improved.  She also states that the wound on her dorsal right foot has healed.  She has no complaints otherwise.  Physical exam: Right hip excellent range of motion.  Surgical incision slight keloid with no signs of infection or wound dehiscence.  Right calf supple nontender dorsiflexion plantarflexion right ankle intact.  Right foot wound is healed well no signs of infection no expressible purulence.  Impression: Status post right total hip arthroplasty 09/24/2020  Plan: She will continue to work on range of motion strengthening right hip.  Work on TEFL teacher.  Follow-up with [place] in 6 months we will obtain an AP pelvis and lateral view of the right hip at that time.  Questions encouraged and answered at length by Dr. Ninfa Blevins and myself.

## 2020-12-20 ENCOUNTER — Other Ambulatory Visit: Payer: Self-pay

## 2020-12-20 MED ORDER — BUDESONIDE-FORMOTEROL FUMARATE 160-4.5 MCG/ACT IN AERO: 2.0000 | INHALATION_SPRAY | Freq: Two times a day (BID) | RESPIRATORY_TRACT | 2 refills | Status: DC

## 2020-12-23 ENCOUNTER — Other Ambulatory Visit: Payer: Self-pay

## 2020-12-23 ENCOUNTER — Telehealth: Payer: Self-pay | Admitting: Pharmacist

## 2020-12-23 ENCOUNTER — Ambulatory Visit: Payer: Medicare HMO | Admitting: Pharmacist

## 2020-12-23 DIAGNOSIS — I471 Supraventricular tachycardia: Secondary | ICD-10-CM

## 2020-12-23 DIAGNOSIS — M8580 Other specified disorders of bone density and structure, unspecified site: Secondary | ICD-10-CM

## 2020-12-23 DIAGNOSIS — E039 Hypothyroidism, unspecified: Secondary | ICD-10-CM

## 2020-12-23 DIAGNOSIS — E785 Hyperlipidemia, unspecified: Secondary | ICD-10-CM

## 2020-12-23 NOTE — Progress Notes (Addendum)
Chronic Care Management Pharmacy Assistant   Name: ELONNA MCFARLANE  MRN: [id number] DOB: Aug 11, 1945  Mary Blevins is an 75 y.o. year old female who presents for her initial CCM visit with the clinical pharmacist.  Reason for Encounter: Chart Prep for Initial CPP visit 12/23/20 @ 11:15am    Recent office visits:  09/02/20 Jannette Spanner, P - Family Medicine - Bronchitis - I do not feel she is in exacerbation today and I do not think she needs steroids or antibiotics.  I encouraged her to use daily maintenance inhaler Judithann Sauger) to prevent exacerbations.  Continue collaboration with Pulmonary.  Return to clinic if symptoms worsen.  08/04/20 Saddie Benders - Family Medicine - Hypertension - Labs were ordered. predniSONE (DELTASONE) 10 MG tablet Take 6 tablets (60 mg total) by mouth daily with breakfast for 1 day, THEN 5 tablets (50 mg total) daily with breakfast for 1 day, THEN 4 tablets (40 mg total) daily with breakfast for 1 day, THEN 3 tablets (30 mg total) daily with breakfast for 1 day, THEN 2 tablets (20 mg total) daily with breakfast for 1 day, THEN 1 tablet (10 mg total) daily with breakfast for 1 day prescribed. Return for labs.   Recent consult visits:  12/15/20 [place] Total replacement of right hip - Hydrocodone 5/325 mg prescribed. Work on range of motion strength exercises.  Follow-up with [place] in 6 months we will obtain an AP pelvis and lateral view of the right hip at that time  11/18/20 [place] Total replacement of right hip - We will see her back in 4 weeks from now to see how she is doing overall.  At that visit we will end up probably ordering repeat Dopplers of her lower extremities on both sides to assess if there is resolution of her DVT  10/21/20 - [place] Total replacement of right hip -  stop her baby aspirin at this point since she was normal before  surgery. We will see her back in 4 weeks to see how she is doing overall but no x-rays are needed.  10/18/20 - Frankey Shown, MD - Orthopedics - sulfamethoxazole-trimethoprim (BACTRIM DS) 800-160 MG tablet Take 1 tablet by mouth 2 (two) times daily prescribed. 10 days of Bactrim since the incision has 1 little area that is macerated prescribed,. Follow up in 2 weeks.  10/04/20 [place] Total replacement of right hip - The right hip incision looks good.  There is no evidence of infection at all.  I was able to aspirate another 60 cc of seroma from the soft tissue. Follow up in 4 days.  10/01/20 Gloriann Loan, MD - Orthopedics - Post Total replacement of right hip - XR of right hip ordered,. [place] joint injection performed. Follow up as scheduled on Monday with  Dr Ninfa Linden.  08/05/20 Erskine Emery, MD - Orthopedics - Osteoarthritis - Hip replacement options discussed and surgery was scheduled. Follow up post surgery.   07/07/20 Erskine Emery, PA-C - Pain of right hip - MRI ordered of right hip.  Refill on hydrocodone was placed. Follow up in 5 days to discuss results.   06/30/20 Vineet Stood, MD - Pulmonology - Centrilobular emphysema - Budeson-Glycopyrrol-Formoterol (BREZTRI AEROSPHERE) 160-9-4.8 MCG/ACT AERO Inhale 2 puffs into the lungs in the morning and at bedtime prescribed. Follow up in 1 year.    Hospital visits: 10/03/20  Medication Reconciliation was  completed by comparing discharge summary, patient's EMR and Pharmacy list, and upon discussion with patient.  Admitted to the hospital on 10/03/20 due to Leg pain post total right hip replacement. Discharge date was 10/03/20. Discharged from [place]?Medications Started at [facility] Discharge:?? started Eliquis due to DVT   Medication Changes at Hospital Discharge: No other changes noted  Medications Discontinued at Hospital Discharge: No medications were discontinued at discharge.  Medications  that remain the same after Hospital Discharge:??  All other medications will remain the same.    Hospital visits: 09/24/20  Medication Reconciliation was completed by comparing discharge summary, patient's EMR and Pharmacy list, and upon discussion with patient.  Admitted to the hospital on 09/24/20 due to Leg pain post total right hip replacement. Discharge date was 09/27/20. Discharged from [place]?Medications Started at [facility] Discharge:?? started Oxycodone for pain Started daily Aspirin.  Medication Changes at Hospital Discharge: No other changes noted  Medications Discontinued at Hospital Discharge: Hydrocodone and Diclofenac were discontinue.  Medications that remain the same after Hospital Discharge:??  All other medications will remain the same   Hospital visits: 08/31/20  Medication Reconciliation was completed by comparing discharge summary, patient's EMR and Pharmacy list, and upon discussion with patient.  Admitted to the hospital on 08/31/20 due to Leg pain post total right hip replacement. Discharge date was 09/01/20. Discharged from [place]?Medications Started at [facility] Discharge:?? None noted.  Medication Changes at Hospital Discharge: No other changes noted  Medications Discontinued at Hospital Discharge: No medication discharged noted.  Medications that remain the same after Hospital Discharge:??  All other medications will remain the same Medications: Outpatient Encounter Medications as of 12/23/2020  Medication Sig Note   apixaban (ELIQUIS) 5 MG TABS tablet Take 1 tablet (5 mg total) by mouth 2 (two) times daily.    Tiotropium Bromide Monohydrate (SPIRIVA RESPIMAT) 2.5 MCG/ACT AERS Inhale 2 puffs into the lungs in the morning and at bedtime.    albuterol (PROVENTIL) (2.5 MG/3ML) 0.083% nebulizer solution Take 3 mLs (2.5 mg total) by nebulization every 6 (six) hours as needed for wheezing or shortness of breath. (Patient  taking differently: Take 2.5 mg by nebulization daily.)    ALPRAZolam (XANAX) 0.5 MG tablet Take 1 tablet (0.5 mg total) by mouth 3 (three) times daily as needed. (Patient taking differently: Take 0.5 mg by mouth 3 (three) times daily as needed for anxiety.) 09/24/2020: Denies taking   aspirin 81 MG chewable tablet Chew 1 tablet (81 mg total) by mouth 2 (two) times daily.    budesonide-formoterol (SYMBICORT) 160-4.5 MCG/ACT inhaler Inhale 2 puffs into the lungs 2 (two) times daily.    DULoxetine (CYMBALTA) 60 MG capsule     fluticasone (FLONASE) 50 MCG/ACT nasal spray SPRAY 2 SPRAYS INTO EACH NOSTRIL EVERY DAY (Patient not taking: Reported on 09/14/2020)    HYDROcodone bit-homatropine (HYCODAN) 5-1.5 MG/5ML syrup Take 5 mLs by mouth every 8 (eight) hours as needed for cough.    HYDROcodone-acetaminophen (NORCO) 5-325 MG tablet Take 1 tablet by mouth every 6 (six) hours as needed for moderate pain.    HYDROcodone-acetaminophen (NORCO/VICODIN) 5-325 MG tablet Take 1 tablet by mouth every 6 (six) hours as needed for moderate pain.    hydrOXYzine (ATARAX/VISTARIL) 25 MG tablet Take 1 tablet (25 mg total) by mouth 3 (three) times daily as needed for itching.    levothyroxine (SYNTHROID) 50 MCG tablet TAKE 1 TABLET BY MOUTH DAILY  BEFORE BREAKFAST    meclizine (ANTIVERT) 25 MG tablet Take 1 tablet (25 mg total) by mouth 3 (three) times daily as needed for dizziness.    meloxicam (MOBIC) 7.5 MG tablet Take 7.5 mg by mouth daily.    NEURONTIN 300 MG capsule Take 300 mg by mouth at bedtime.    OVER THE COUNTER MEDICATION Apply 1 application topically 2 (two) times daily. Stop pain roll-on    Propylene Glycol-Glycerin 0.6-0.6 % SOLN Place 1 drop into both eyes 2 (two) times daily. Artifical tears    rosuvastatin (CRESTOR) 5 MG tablet Take 1 tablet (5 mg total) by mouth daily.    sodium chloride (OCEAN) 0.65 % SOLN nasal spray Place 1 spray into both nostrils daily.    sulfamethoxazole-trimethoprim (BACTRIM DS)  800-160 MG tablet Take 1 tablet by mouth 2 (two) times daily.    tiZANidine (ZANAFLEX) 4 MG tablet TAKE 1 TABLET BY MOUTH EVERY 6 HOURS AS NEEDED FOR MUSCLE SPASMS.    verapamil (CALAN-SR) 120 MG CR tablet TAKE 1 TABLET EVERY DAY    No facility-administered encounter medications on file as of 12/23/2020.    Have you seen any other providers since your last visit? No  Any changes in your medications or health? Yes. Just has right hip replaced  Any side effects from any medications? no  Do you have an symptoms or problems not managed by your medications? no  Any concerns about your health right now? Yes. Numbness in legs and feet since surgery  Has your provider asked that you check blood pressure, blood sugar, or follow special diet at home? no  Do you get any type of exercise on a regular basis? Yes home exercise  Can you think of a goal you would like to reach for your health? Continue to get better post surgery.  Do you have any problems getting your medications? No but patient is currently in the doughnut hole.  Is there anything that you would like to discuss during the appointment? No   Please bring medications and supplements to appointment  Patient confirmed appointment date and time.   Care Gaps  AWV: done 07/15/20 Colonoscopy: done 01/06/2016 DM Eye Exam: N/A DM Foot Exam: N/A Microalbumin: N/A HbgAIC: done 08/04/20 (5.6) DEXA: done 07/21/16 Mammogram: done 01/09/20  [facility] rosuvastatin (CRESTOR) 5 MG tablet - last filled 09/17/20 90 days   Future Appointments  Date Time Provider [place]  12/23/2020 11:15 AM [facility] PHARMACIST [facility] None  01/11/2021  1:00 PM Penumalli, Earlean Polka, MD [facility] None  04/25/2021  9:00 AM Belva Crome, MD [facility]  06/14/2021 10:45 AM Mcarthur Rossetti, MD [facility] None    Jobe Gibbon, [facility] Clinical Pharmacist Assistant  [phone]

## 2020-12-23 NOTE — Progress Notes (Signed)
Chronic Care Management Pharmacy Note  12/23/2020 Name:  Mary Blevins MRN:  [id number] DOB:  1945-06-11  Summary: Initial visit with PharmD.  Patient mainly needs assistance with copay help.  All meds reviewed and list updated.  Recommendations/Changes made from today's visit: Patient assistance started for Symbicort and Spiriva.  Plan: Fu 6 months   Subjective: Mary Blevins is an 75 y.o. year old female who is a primary patient of Pickard, Cammie Mcgee, MD.  The CCM team was consulted for assistance with disease management and care coordination needs.    Engaged with patient face to face for initial visit in response to provider referral for pharmacy case management and/or care coordination services.   Consent to Services:  The patient was given the following information about Chronic Care Management services today, agreed to services, and gave verbal consent: 1. CCM service includes personalized support from designated clinical staff supervised by the primary care provider, including individualized plan of care and coordination with other care providers 2. 24/7 contact phone numbers for assistance for urgent and routine care needs. 3. Service will only be billed when office clinical staff spend 20 minutes or more in a month to coordinate care. 4. Only one practitioner may furnish and bill the service in a calendar month. 5.The patient may stop CCM services at any time (effective at the end of the month) by phone call to the office staff. 6. The patient will be responsible for cost sharing (co-pay) of up to 20% of the service fee (after annual deductible is met). Patient agreed to services and consent obtained.  Patient Care Team: [name], MD as PCP - General (Family Medicine) Belva Crome, MD as PCP - Cardiology (Cardiology) Edythe Clarity, [facility] as Pharmacist (Pharmacist)  Recent office visits:  09/02/20 Jannette Spanner, P - Family Medicine - Bronchitis - I  do not feel she is in exacerbation today and I do not think she needs steroids or antibiotics.  I encouraged her to use daily maintenance inhaler Judithann Sauger) to prevent exacerbations.  Continue collaboration with Pulmonary.  Return to clinic if symptoms worsen.   08/04/20 Saddie Benders - Family Medicine - Hypertension - Labs were ordered. predniSONE (DELTASONE) 10 MG tablet Take 6 tablets (60 mg total) by mouth daily with breakfast for 1 day, THEN 5 tablets (50 mg total) daily with breakfast for 1 day, THEN 4 tablets (40 mg total) daily with breakfast for 1 day, THEN 3 tablets (30 mg total) daily with breakfast for 1 day, THEN 2 tablets (20 mg total) daily with breakfast for 1 day, THEN 1 tablet (10 mg total) daily with breakfast for 1 day prescribed. Return for labs.     Recent consult visits:  12/15/20 [place] Total replacement of right hip - Hydrocodone 5/325 mg prescribed. Work on range of motion strength exercises.  Follow-up with [place] in 6 months we will obtain an AP pelvis and lateral view of the right hip at that time   11/18/20 [place] Total replacement of right hip - We will see her back in 4 weeks from now to see how she is doing overall.  At that visit we will end up probably ordering repeat Dopplers of her lower extremities on both sides to assess if there is resolution of her DVT   10/21/20 - [place] Total replacement of right hip -  stop her baby aspirin at  this point since she was normal before surgery. We will see her back in 4 weeks to see how she is doing overall but no x-rays are needed.   10/18/20 - Frankey Shown, MD - Orthopedics - sulfamethoxazole-trimethoprim (BACTRIM DS) 800-160 MG tablet Take 1 tablet by mouth 2 (two) times daily prescribed. 10 days of Bactrim since the incision has 1 little area that is macerated prescribed,. Follow up in 2 weeks.   10/04/20 [place] Total replacement of right hip - The right hip incision looks good.  There is no evidence of infection at all.  I was able to aspirate another 60 cc of seroma from the soft tissue. Follow up in 4 days.   10/01/20 Gloriann Loan, MD - Orthopedics - Post Total replacement of right hip - XR of right hip ordered,. [place] joint injection performed. Follow up as scheduled on Monday with  Dr Ninfa Linden.   08/05/20 Erskine Emery, MD - Orthopedics - Osteoarthritis - Hip replacement options discussed and surgery was scheduled. Follow up post surgery.    07/07/20 Erskine Emery, PA-C - Pain of right hip - MRI ordered of right hip.  Refill on hydrocodone was placed. Follow up in 5 days to discuss results.    06/30/20 Vineet Stood, MD - Pulmonology - Centrilobular emphysema - Budeson-Glycopyrrol-Formoterol (BREZTRI AEROSPHERE) 160-9-4.8 MCG/ACT AERO Inhale 2 puffs into the lungs in the morning and at bedtime prescribed. Follow up in 1 year.      Hospital visits: 10/03/20   Medication Reconciliation was completed by comparing discharge summary, patient's EMR and Pharmacy list, and upon discussion with patient.   Admitted to the hospital on 10/03/20 due to Leg pain post total right hip replacement. Discharge date was 10/03/20. Discharged from [place]?Medications Started at [facility] Discharge:?? started Eliquis due to DVT    Medication Changes at Hospital Discharge: No other changes noted   Medications Discontinued at Hospital Discharge: No medications were discontinued at discharge.   Medications that remain the same after Hospital Discharge:??  All other medications will remain the same.     Hospital visits: 09/24/20   Medication Reconciliation was completed by comparing discharge summary, patient's EMR and Pharmacy list, and upon discussion with patient.   Admitted to the hospital on 09/24/20 due to Leg pain post total right hip replacement. Discharge date was 09/27/20.  Discharged from [place]?Medications Started at [facility] Discharge:?? started Oxycodone for pain Started daily Aspirin.   Medication Changes at Hospital Discharge: No other changes noted   Medications Discontinued at Hospital Discharge: Hydrocodone and Diclofenac were discontinue.   Medications that remain the same after Hospital Discharge:??  All other medications will remain the same     Hospital visits: 08/31/20   Medication Reconciliation was completed by comparing discharge summary, patient's EMR and Pharmacy list, and upon discussion with patient.   Admitted to the hospital on 08/31/20 due to Leg pain post total right hip replacement. Discharge date was 09/01/20. Discharged from [place]?Medications Started at [facility] Discharge:?? None noted.   Medication Changes at Hospital Discharge: No other changes noted   Medications Discontinued at Hospital Discharge: No medication discharged noted.   Medications that remain the same after Hospital Discharge:??  All other medications will remain the same   Objective:  Lab Results  Component Value Date   CREATININE 0.96 09/26/2020   BUN 19 09/26/2020   GFRNONAA >  60 09/26/2020   GFRAA 92 03/12/2019   NA 130 (L) 09/26/2020   K 3.9 09/26/2020   CALCIUM 8.4 (L) 09/26/2020   CO2 23 09/26/2020   GLUCOSE 127 (H) 09/26/2020    Lab Results  Component Value Date/Time   HGBA1C 5.6 08/04/2020 12:30 PM    Last diabetic Eye exam: No results found for: HMDIABEYEEXA  Last diabetic Foot exam: No results found for: HMDIABFOOTEX   Lab Results  Component Value Date   CHOL 144 08/04/2020   HDL 49 (L) 08/04/2020   LDLCALC 73 08/04/2020   TRIG 136 08/04/2020   CHOLHDL 2.9 08/04/2020    Hepatic Function Latest Ref Rng & Units 08/04/2020 01/07/2020 03/12/2019  Total Protein 6.1 - 8.1 g/dL 6.6 6.3 6.5  Albumin 3.5 - 4.8 g/dL - - -  AST 10 - 35 U/L _0 ALT 6 - 29 U/L _1 Alk  Phosphatase 39 - 117 IU/L - - -  Total Bilirubin 0.2 - 1.2 mg/dL 0.6 0.5 0.4  Bilirubin, Direct 0.00 - 0.40 mg/dL - - -    Lab Results  Component Value Date/Time   TSH 2.65 08/04/2020 12:30 PM   TSH 3.53 03/12/2019 08:37 AM    CBC Latest Ref Rng & Units 09/26/2020 09/25/2020 08/31/2020  WBC 4.0 - 10.5 K/uL 14.8(H) 15.5(H) 8.7  Hemoglobin 12.0 - 15.0 g/dL 11.8(L) 12.4 13.3  Hematocrit 36.0 - 46.0 % 35.7(L) 37.8 41.5  Platelets 150 - 400 K/uL 215 291 358    No results found for: VD25OH  Clinical ASCVD: No  The 10-year ASCVD risk score (Arnett DK, et al., 2019) is: 14.8%   Values used to calculate the score:     Age: 47 years     Sex: Female     Is Non-Hispanic African American: Yes     Diabetic: No     Tobacco smoker: No     Systolic Blood Pressure: 081 mmHg     Is BP treated: Yes     HDL Cholesterol: 49 mg/dL     Total Cholesterol: 144 mg/dL    Depression screen [facility] 2/9 07/15/2020 05/03/2020 03/10/2019  Decreased Interest 1 0 0  Down, Depressed, Hopeless 1 0 0  PHQ - 2 Score 2 0 0  Altered sleeping 0 - -  Tired, decreased energy 1 - -  Change in appetite 1 - -  Feeling bad or failure about yourself  0 - -  Trouble concentrating 0 - -  Moving slowly or fidgety/restless 1 - -  Suicidal thoughts 0 - -  PHQ-9 Score 5 - -  Difficult doing work/chores Somewhat difficult - -  Some recent data might be hidden     Social History   Tobacco Use  Smoking Status Former   Types: Cigarettes   Quit date: 07/24/2005   Years since quitting: 15.4  Smokeless Tobacco Never   BP Readings from Last 3 Encounters:  09/27/20 138/87  09/16/20 (!) 145/79  09/02/20 114/72   Pulse Readings from Last 3 Encounters:  09/27/20 97  09/16/20 66  09/02/20 86   Wt Readings from Last 3 Encounters:  09/24/20 162 lb 7.7 oz (73.7 kg)  09/16/20 162 lb 7.7 oz (73.7 kg)  09/02/20 169 lb 9.6 oz (76.9 kg)   BMI Readings from Last 3 Encounters:  09/24/20 26.63 kg/m  09/16/20 26.63 kg/m  09/02/20  28.22 kg/m    Assessment/Interventions: Review of patient past medical history, allergies, medications, health status, including review  of consultants reports, laboratory and other test data, was performed as part of comprehensive evaluation and provision of chronic care management services.   SDOH:  (Social Determinants of Health) assessments and interventions performed: Yes  Financial Resource Strain: Low Risk    Difficulty of Paying Living Expenses: Not hard at all    SDOH Screenings   Alcohol Screen: Low Risk    Last Alcohol Screening Score (AUDIT): 1  Depression (PHQ2-9): Medium Risk   PHQ-2 Score: 5  Financial Resource Strain: Low Risk    Difficulty of Paying Living Expenses: Not hard at all  Food Insecurity: No Food Insecurity   Worried About Charity fundraiser in the Last Year: Never true   Ran Out of Food in the Last Year: Never true  Housing: Low Risk    Last Housing Risk Score: 0  Physical Activity: Inactive   Days of Exercise per Week: 0 days   Minutes of Exercise per Session: 0 min  Social Connections: Moderately Isolated   Frequency of Communication with Friends and Family: More than three times a week   Frequency of Social Gatherings with Friends and Family: More than three times a week   Attends Religious Services: More than 4 times per year   Active Member of [employer] or Organizations: No   Attends Archivist Meetings: Never   Marital Status: Widowed  Stress: No Stress Concern Present   Feeling of Stress : Only a little  Tobacco Use: Medium Risk   Smoking Tobacco Use: Former   Smokeless Tobacco Use: Never   Passive Exposure: Not on file  Transportation Needs: No Transportation Needs   Lack of Transportation (Medical): No   Lack of Transportation (Non-Medical): No    CCM Care Plan  Allergies  Allergen Reactions   Meloxicam Other (See Comments)    BAD DREAMS, SAD THOUGHTS    Medications Reviewed Today     Reviewed by Georgia Lopes (Physician Assistant Certified) on 62/03/55 at [place] List Status: <None>   Medication Order Taking? Sig Documenting Provider Last Dose Status Informant  albuterol (PROVENTIL) (2.5 MG/3ML) 0.083% nebulizer solution [id number] No Take 3 mLs (2.5 mg total) by nebulization every 6 (six) hours as needed for wheezing or shortness of breath.  Patient taking differently: Take 2.5 mg by nebulization daily.   [name], MD 09/23/2020 Active   ALPRAZolam Duanne Moron) 0.5 MG tablet [id number] No Take 1 tablet (0.5 mg total) by mouth 3 (three) times daily as needed.  Patient taking differently: Take 0.5 mg by mouth 3 (three) times daily as needed for anxiety.   [name], MD Taking Active Self           Med Note Ernestene Mention   Fri Sep 24, 2020  9:32 AM) Denies taking  apixaban (ELIQUIS) 5 MG TABS tablet [id number]  Take 1 tablet (5 mg total) by mouth 2 (two) times daily. Mcarthur Rossetti, MD  Active   aspirin 81 MG chewable tablet [id number]  Chew 1 tablet (81 mg total) by mouth 2 (two) times daily. Mcarthur Rossetti, MD  Active   budesonide-formoterol [facility]) 160-4.5 MCG/ACT inhaler [id number] No Inhale 2 puffs into the lungs 2 (two) times daily. [provider] 09/24/2020 0530 Active Self  DULoxetine (CYMBALTA) 60 MG capsule [id number]   [provider]  Active   fluticasone (FLONASE) 50 MCG/ACT nasal spray [id number] No SPRAY 2 SPRAYS INTO EACH NOSTRIL EVERY DAY  Patient not taking: Reported  on 09/14/2020   [name], MD Not Taking Active Self  HYDROcodone bit-homatropine [facility]) 5-1.5 MG/5ML syrup [id number] No Take 5 mLs by mouth every 8 (eight) hours as needed for cough. [name], MD More than a month Active Self  HYDROcodone-acetaminophen (NORCO) 5-325 MG tablet [id number] Yes Take 1 tablet by mouth every 6 (six) hours as needed for moderate pain. Pete Pelt, PA-C  Active   HYDROcodone-acetaminophen (NORCO/VICODIN) 5-325 MG tablet  [id number]  Take 1 tablet by mouth every 6 (six) hours as needed for moderate pain. Mcarthur Rossetti, MD  Active   hydrOXYzine (ATARAX/VISTARIL) 25 MG tablet [id number]  Take 1 tablet (25 mg total) by mouth 3 (three) times daily as needed for itching. [name], MD  Active   levothyroxine (SYNTHROID) 50 MCG tablet [id number] No TAKE 1 TABLET BY MOUTH DAILY BEFORE BREAKFAST [name], MD 09/24/2020 00 Active Self  meclizine (ANTIVERT) 25 MG tablet [id number] No Take 1 tablet (25 mg total) by mouth 3 (three) times daily as needed for dizziness. [name], MD More than a month Active Self           Med Note Gentry Roch   Tue Sep 14, 2020 12:08 PM)    meloxicam (MOBIC) 7.5 MG tablet [id number]  Take 7.5 mg by mouth daily. [provider]  Active   NEURONTIN 300 MG capsule [id number]  Take 300 mg by mouth at bedtime. [provider]  Active   OVER THE COUNTER MEDICATION [id number] No Apply 1 application topically 2 (two) times daily. Stop pain roll-on [provider] Past Week Active Self  Propylene Glycol-Glycerin 0.6-0.6 % SOLN [id number] No Place 1 drop into both eyes 2 (two) times daily. Artifical tears [provider] Past Week Active Self  rosuvastatin (CRESTOR) 5 MG tablet [id number]  Take 1 tablet (5 mg total) by mouth daily. Belva Crome, MD  Active   sodium chloride (OCEAN) 0.65 % SOLN nasal spray [id number] No Place 1 spray into both nostrils daily. [provider] Past Week Active Self  sulfamethoxazole-trimethoprim (BACTRIM DS) 800-160 MG tablet [id number]  Take 1 tablet by mouth 2 (two) times daily. Leandrew Koyanagi, MD  Active   Tiotropium Bromide Monohydrate (SPIRIVA RESPIMAT) 2.5 MCG/ACT AERS [id number]  Inhale 2 puffs into the lungs in the morning and at bedtime. [name], MD  Active   tiZANidine (ZANAFLEX) 4 MG tablet [id number]  TAKE 1 TABLET BY MOUTH EVERY 6 HOURS AS NEEDED FOR MUSCLE SPASMS. [name],  MD  Active   verapamil (CALAN-SR) 120 MG CR tablet [id number]  TAKE 1 TABLET EVERY DAY [name], MD  Active             Patient Active Problem List   Diagnosis Date Noted   Decreased urine output 09/26/2020    Class: Acute   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   Unilateral primary osteoarthritis, right hip 09/23/2020   Leg cramping 01/07/2020   GAD (generalized anxiety disorder) 12/20/2016   Chronic insomnia 12/20/2016   Aortic atherosclerosis ([place]) 06/09/2016   Benign neoplasm of ascending colon    Benign neoplasm of transverse colon    Benign neoplasm of sigmoid colon    Diverticulosis of sigmoid colon    Grade I internal hemorrhoids    PSVT (paroxysmal supraventricular tachycardia) ([place]) 02/17/2015   Back pain 07/22/2012   Acute pain of left hip 07/22/2012   Stricture and stenosis of esophagus  09/18/2011   Hx of colonic polyps 09/18/2011   COPD (chronic obstructive pulmonary disease) ([place]) 07/14/2010   Generalized headaches 07/14/2010   Osteopenia 07/14/2010   Hypothyroid 07/14/2010   Elevated lipids 07/14/2010    Immunization History  Administered Date(s) Administered   Fluad Quad(high Dose 65+) 10/08/2018, 10/16/2019, 11/18/2020   Hepatitis B 12/06/1990, 01/03/1991, 07/07/1991   Influenza Split 10/17/2013   Influenza Whole 10/23/2008   Influenza, High Dose Seasonal PF 09/26/2016, 10/12/2017   Influenza,inj,Quad PF,6+ Mos 10/29/2012, 10/28/2015   Influenza,inj,quad, With Preservative 10/30/2018   Influenza-Unspecified 10/05/2014   PFIZER(Purple Top)SARS-COV-2 Vaccination 06/09/2019, 06/30/2019, 02/06/2020   Pneumococcal Conjugate-13 03/23/2013   Pneumococcal Polysaccharide-23 12/21/2005, 07/25/2010   Td 03/13/1996   Tdap 08/12/2010   Zoster, Live 07/12/2010    Conditions to be addressed/monitored:  PSVT, COPD, Hypothyroidism, Osteopenia, GAD, Insomnia, HLD  There are no care plans that you recently modified to display for this  patient.    Medication Assistance: None required.  Patient affirms current coverage meets needs.  Compliance/Adherence/Medication fill history: Care Gaps: Zoster Vaccines  Star-Rating Drugs: rosuvastatin (CRESTOR) 5 MG tablet - last filled 09/17/20 90 days   Patient's preferred pharmacy is:  [id number] [place] [place]: [phone]: [phone] [facility] [address] [place] [address]Phone: [phone]: [phone] [id number] [address] [address]: [phone]: [phone] pill box? Yes Pt endorses 100% compliance  We discussed: Benefits of medication synchronization, packaging and delivery as well as enhanced pharmacist oversight with [employer]. Patient decided to: Continue current medication management strategy  Care Plan and Follow Up Patient Decision:  Patient agrees to Care Plan and Follow-up.  Plan: The care management team will reach out to the patient again over the next 180 days.  ChBeverly MilchPharmD, CPP Clinical Pharmacist Practitioner [facility] ([phone]  Current Barriers:  Unable to independently afford treatment regimen Unable to independently monitor therapeutic efficacy  Pharmacist Clinical Goal(s):  Patient will verbalize ability to afford treatment regimen achieve control of BP and COPD as evidenced by symptoms  through collaboration with PharmD and provider.   Interventions: 1:1 collaboration with PiSusy FrizzleMD regarding development and update of comprehensive plan of care as evidenced by provider attestation and co-signature Inter-disciplinary care team collaboration (see longitudinal plan of care) Comprehensive medication review performed;  medication list updated in electronic medical record  Hypertension (BP goal <140/90) -Controlled -Current treatment: Verapamil 12071maily -Medications previously tried: none noted  -Current home readings: not checking  -Denies hypotensive/hypertensive symptoms -Educated on BP goals and benefits of medications for prevention of heart attack, stroke and kidney damage; Daily salt intake goal < 2300 mg; Exercise goal of 150 minutes per week; Importance of home blood pressure monitoring; -Counseled to monitor BP at home weekly, document, and provide log at future appointments -Recommended to continue current medication She denies any symptoms of HTN at home.  Hyperlipidemia: (LDL goal < 100) -Controlled -Current treatment: Rosuvastatin 5mg12mily -Medications previously tried: none ntoed  -Educated on Cholesterol goals;  Benefits of statin for ASCVD risk reduction; Importance of limiting foods high in cholesterol; -Recommended to continue current medication Most recent LDL is controlled.  COPD (Goal: control symptoms and prevent exacerbations) -Controlled -Current treatment  Symbicort 160-4.62mg/act 2 puffs into the lungs BID Spiriva 2.537m/act 2 puffs bid Albuterol 0.083% nebulizer soln prn -Medications previously tried: BrLibrarian, academictrelegy (these did not work)  -Pulmonary function testing: Pulmonary Functions Testing Results:  TLC  Date Value Ref Range Status  03/01/2017 6.41 L Final    -Exacerbations requiring treatment in last 6 months: none -Patient reports consistent use of maintenance inhaler -Frequency of rescue inhaler use: prn -- if she is out during the day she will use when gets home -Counseled on [place] inhaler technique; Benefits of consistent maintenance inhaler use When to use rescue inhaler -Recommended to continue current medication Assessed patient finances. She does report copay on Symbicort and Spiriva are high.  She has a cousin who she gets these  from. Will help assist with patient assistance applications for these.   Hypothyroidism (Goal: Maintain TSH) -Controlled -Current treatment  Levothyroxine 5066mdaily -Medications previously tried: none noted -Takes appropriately - 30 minutes before food in mornign TSH was WNL at last labs  -Recommended to continue current medication  Patient Goals/Self-Care Activities Patient will:  - take medications as prescribed as evidenced by patient report and record review collaborate with provider on medication access solutions Continue to improve post hip replacement surgery  Follow Up Plan: The care management team will reach out to the patient again over the next 180 days.

## 2020-12-23 NOTE — Patient Instructions (Addendum)
Visit Information   Goals Addressed             This Visit's Progress    Track and Manage My Symptoms-COPD       Timeframe:  Long-Range Goal Priority:  High Start Date: 12/23/20                            Expected End Date: 06/23/21                      Follow Up Date 03/23/21    - develop a rescue plan - eliminate symptom triggers at home - follow rescue plan if symptoms flare-up    Why is this important?   Tracking your symptoms and other information about your health helps your doctor plan your care.  Write down the symptoms, the time of day, what you were doing and what medicine you are taking.  You will soon learn how to manage your symptoms.     Notes:        Patient Care Plan: General Pharmacy (Adult)     Problem Identified: PSVT, COPD, Hypothyroidism, Osteopenia, GAD, Insomnia, HLD   Priority: High  Onset Date: 12/23/2020     Long-Range Goal: Patient-Specific Goal   Start Date: 12/23/2020  Expected End Date: 06/23/2021  This Visit's Progress: On track  Priority: High  Note:   Current Barriers:  Unable to independently afford treatment regimen Unable to independently monitor therapeutic efficacy  Pharmacist Clinical Goal(s):  Patient will verbalize ability to afford treatment regimen achieve control of BP and COPD as evidenced by symptoms through collaboration with PharmD and provider.   Interventions: 1:1 collaboration with [name], MD regarding development and update of comprehensive plan of care as evidenced by provider attestation and co-signature Inter-disciplinary care team collaboration (see longitudinal plan of care) Comprehensive medication review performed; medication list updated in electronic medical record  Hypertension (BP goal <140/90) -Controlled -Current treatment: Verapamil 120mg  daily -Medications previously tried: none noted  -Current home readings: not checking  -Denies hypotensive/hypertensive symptoms -Educated on  BP goals and benefits of medications for prevention of heart attack, stroke and kidney damage; Daily salt intake goal < 2300 mg; Exercise goal of 150 minutes per week; Importance of home blood pressure monitoring; -Counseled to monitor BP at home weekly, document, and provide log at future appointments -Recommended to continue current medication She denies any symptoms of HTN at home.  Hyperlipidemia: (LDL goal < 100) -Controlled -Current treatment: Rosuvastatin 5mg  daily -Medications previously tried: none ntoed  -Educated on Cholesterol goals;  Benefits of statin for ASCVD risk reduction; Importance of limiting foods high in cholesterol; -Recommended to continue current medication Most recent LDL is controlled.  COPD (Goal: control symptoms and prevent exacerbations) -Controlled -Current treatment  Symbicort 160-4.18mcg/act 2 puffs into the lungs BID Spiriva 2.64mcg/act 2 puffs bid Albuterol 0.083% nebulizer soln prn -Medications previously tried: Librarian, academic, trelegy (these did not work)  -Pulmonary function testing: Pulmonary Functions Testing Results:  TLC  Date Value Ref Range Status  03/01/2017 6.41 L Final    -Exacerbations requiring treatment in last 6 months: none -Patient reports consistent use of maintenance inhaler -Frequency of rescue inhaler use: prn -- if she is out during the day she will use when gets home -Counseled on Proper inhaler technique; Benefits of consistent maintenance inhaler use When to use rescue inhaler -Recommended to continue current medication Assessed patient finances. She does report copay on  Symbicort and Spiriva are high.  She has a cousin who she gets these from. Will help assist with patient assistance applications for these.   Hypothyroidism (Goal: Maintain TSH) -Controlled -Current treatment  Levothyroxine 70mcg daily -Medications previously tried: none noted -Takes appropriately - 30 minutes before food in mornign TSH was WNL at  last labs  -Recommended to continue current medication  Patient Goals/Self-Care Activities Patient will:  - take medications as prescribed as evidenced by patient report and record review collaborate with provider on medication access solutions Continue to improve post hip replacement surgery  Follow Up Plan: The care management team will reach out to the patient again over the next 180 days.        Mary Blevins was given information about Chronic Care Management services today including:  CCM service includes personalized support from designated clinical staff supervised by her physician, including individualized plan of care and coordination with other care providers 24/7 contact phone numbers for assistance for urgent and routine care needs. Standard insurance, coinsurance, copays and deductibles apply for chronic care management only during months in which we provide at least 20 minutes of these services. Most insurances cover these services at 100%, however patients may be responsible for any copay, coinsurance and/or deductible if applicable. This service may help you avoid the need for more expensive face-to-face services. Only one practitioner may furnish and bill the service in a calendar month. The patient may stop CCM services at any time (effective at the end of the month) by phone call to the office staff.  Patient agreed to services and verbal consent obtained.   The patient verbalized understanding of instructions, educational materials, and care plan provided today and agreed to receive a mailed copy of patient instructions, educational materials, and care plan.  Telephone follow up appointment with pharmacy team member scheduled for: 6 months  Edythe Clarity, [place]

## 2020-12-24 ENCOUNTER — Telehealth: Payer: Self-pay | Admitting: Pharmacist

## 2020-12-24 NOTE — Progress Notes (Addendum)
    Chronic Care Management Pharmacy Assistant   Name: Mary Blevins  MRN: [id number] DOB: 11/05/1945   Reason for Encounter: PAP initiation for Symbicort and Spiriva   PAP applications filled out for both Symbicort and Spiriva. Will mail to patient and have them fill out patient portion with required documents enclosed. She will return to Dr Antony Contras office for completion and then contact us of outcome.    Jobe Gibbon, [facility] Clinical Pharmacist Assistant  [phone]

## 2020-12-29 ENCOUNTER — Other Ambulatory Visit: Payer: Self-pay | Admitting: Family Medicine

## 2020-12-31 ENCOUNTER — Telehealth: Payer: Self-pay | Admitting: Family Medicine

## 2020-12-31 ENCOUNTER — Telehealth: Payer: Self-pay | Admitting: Pulmonary Disease

## 2020-12-31 MED ORDER — SPIRIVA RESPIMAT 2.5 MCG/ACT IN AERS: 2.0000 | INHALATION_SPRAY | Freq: Every day | RESPIRATORY_TRACT | 0 refills | Status: DC

## 2020-12-31 NOTE — Telephone Encounter (Signed)
Patient called to speak with pharmacist; unable to leave a message on his voicemail.  Please advise at [phone].

## 2020-12-31 NOTE — Telephone Encounter (Signed)
Contacted patient questions answered

## 2020-12-31 NOTE — Telephone Encounter (Signed)
Patient called and states that she is in the donut hole and needs samples of her Spiriva inhaler. Advised her that I would put samples up front for her to pick up as well as patient assistance paperwork. Nothing further needed at this time.

## 2021-01-03 ENCOUNTER — Telehealth: Payer: Self-pay | Admitting: *Deleted

## 2021-01-03 NOTE — Telephone Encounter (Signed)
Ortho bundle 90 day call completed. 

## 2021-01-04 ENCOUNTER — Telehealth: Payer: Self-pay | Admitting: Pulmonary Disease

## 2021-01-04 NOTE — Telephone Encounter (Signed)
Filled out remaining portions of form and placed in Dr. Collie Siad folder for him to sign and review next week when he is in [place].

## 2021-01-06 ENCOUNTER — Other Ambulatory Visit: Payer: Self-pay

## 2021-01-06 MED ORDER — BUDESONIDE-FORMOTEROL FUMARATE 160-4.5 MCG/ACT IN AERO: 2.0000 | INHALATION_SPRAY | Freq: Two times a day (BID) | RESPIRATORY_TRACT | 5 refills | Status: DC

## 2021-01-11 ENCOUNTER — Other Ambulatory Visit: Payer: Self-pay

## 2021-01-11 ENCOUNTER — Telehealth: Payer: Self-pay | Admitting: Orthopaedic Surgery

## 2021-01-11 ENCOUNTER — Ambulatory Visit: Payer: Medicare HMO | Admitting: Diagnostic Neuroimaging

## 2021-01-11 ENCOUNTER — Encounter: Payer: Self-pay | Admitting: Diagnostic Neuroimaging

## 2021-01-11 VITALS — BP 127/76 | HR 75 | Ht 65.5 in | Wt 171.6 lb

## 2021-01-11 DIAGNOSIS — R200 Anesthesia of skin: Secondary | ICD-10-CM | POA: Diagnosis not present

## 2021-01-11 DIAGNOSIS — R202 Paresthesia of skin: Secondary | ICD-10-CM

## 2021-01-11 NOTE — Telephone Encounter (Signed)
Pt called requesting a script for cane. Pt states she is in need of one. Pt is asking for a call back. Pt asking if script can be faxed to pharmacy or can the dr mail it to her. Pt phone number is [phone].

## 2021-01-11 NOTE — Progress Notes (Signed)
[facility]  PATIENT: Mary Blevins DOB: 11-16-1945  REFERRING CLINICIAN: Eulogio Bear, NP HISTORY FROM: patient  REASON FOR VISIT: new consult    HISTORICAL  CHIEF COMPLAINT:  Chief Complaint  Patient presents with   Numbness    Rm 7 New Pt  "right hand is the worst, they lock up"     HISTORY OF PRESENT ILLNESS:   75 year old female here for evaluation of right hand numbness.  Patient has numbness and tingling in right hand digits 4 and 5.  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  Has some issues with numbness in the toes.   REVIEW OF SYSTEMS: Full 14 system review of systems performed and negative with exception of: As per HPI.  ALLERGIES: Allergies  Allergen Reactions   Meloxicam Other (See Comments)    BAD DREAMS, SAD THOUGHTS    HOME MEDICATIONS: Outpatient Medications Prior to Visit  Medication Sig Dispense Refill   albuterol (PROVENTIL) (2.5 MG/3ML) 0.083% nebulizer solution Take 3 mLs (2.5 mg total) by nebulization every 6 (six) hours as needed for wheezing or shortness of breath. (Patient taking differently: Take 2.5 mg by nebulization daily.) 360 mL 3   apixaban (ELIQUIS) 5 MG TABS tablet Take 1 tablet (5 mg total) by mouth 2 (two) times daily. 60 tablet 3   budesonide-formoterol (SYMBICORT) 160-4.5 MCG/ACT inhaler Inhale 2 puffs into the lungs 2 (two) times daily. 1 each 5   DULoxetine (CYMBALTA) 60 MG capsule      fluticasone (FLONASE) 50 MCG/ACT nasal spray SPRAY 2 SPRAYS INTO EACH NOSTRIL EVERY DAY 16 g 2   HYDROcodone bit-homatropine (HYCODAN) 5-1.5 MG/5ML syrup Take 5 mLs by mouth every 8 (eight) hours as needed for cough. 120 mL 0   HYDROcodone-acetaminophen (NORCO) 5-325 MG tablet Take 1 tablet by mouth every 6 (six) hours as needed for moderate pain. 30 tablet 0   HYDROcodone-acetaminophen (NORCO/VICODIN) 5-325 MG tablet Take 1 tablet by mouth every 6 (six) hours as needed for moderate pain. 30 tablet  0   hydrOXYzine (ATARAX/VISTARIL) 25 MG tablet Take 1 tablet (25 mg total) by mouth 3 (three) times daily as needed for itching. 30 tablet 6   levothyroxine (SYNTHROID) 50 MCG tablet TAKE 1 TABLET BY MOUTH DAILY BEFORE BREAKFAST 90 tablet 3   NEURONTIN 300 MG capsule Take 300 mg by mouth at bedtime.     OVER THE COUNTER MEDICATION Apply 1 application topically 2 (two) times daily. Stop pain roll-on     Propylene Glycol-Glycerin 0.6-0.6 % SOLN Place 1 drop into both eyes 2 (two) times daily. Artifical tears     rosuvastatin (CRESTOR) 5 MG tablet Take 1 tablet (5 mg total) by mouth daily. 90 tablet 3   sodium chloride (OCEAN) 0.65 % SOLN nasal spray Place 1 spray into both nostrils daily.     sulfamethoxazole-trimethoprim (BACTRIM DS) 800-160 MG tablet Take 1 tablet by mouth 2 (two) times daily. 20 tablet 0   Tiotropium Bromide Monohydrate (SPIRIVA RESPIMAT) 2.5 MCG/ACT AERS Inhale 2 puffs into the lungs in the morning and at bedtime. 3 each 4   Tiotropium Bromide Monohydrate (SPIRIVA RESPIMAT) 2.5 MCG/ACT AERS Inhale 2 puffs into the lungs daily. 4 g 0   tiZANidine (ZANAFLEX) 4 MG tablet TAKE 1 TABLET BY MOUTH EVERY 6 HOURS AS NEEDED FOR MUSCLE SPASMS. 30 tablet 0   verapamil (CALAN-SR) 120 MG CR tablet TAKE 1 TABLET EVERY DAY 90 tablet 3   ALPRAZolam (XANAX) 0.5 MG tablet Take  1 tablet (0.5 mg total) by mouth 3 (three) times daily as needed. (Patient not taking: Reported on 01/11/2021) 30 tablet 0   aspirin 81 MG chewable tablet Chew 1 tablet (81 mg total) by mouth 2 (two) times daily. (Patient not taking: Reported on 01/11/2021) 30 tablet 0   meclizine (ANTIVERT) 25 MG tablet Take 1 tablet (25 mg total) by mouth 3 (three) times daily as needed for dizziness. 30 tablet 0   meloxicam (MOBIC) 7.5 MG tablet Take 7.5 mg by mouth daily.     No facility-administered medications prior to visit.    PAST MEDICAL HISTORY: Past Medical History:  Diagnosis Date   Arthritis    COPD (chronic obstructive  pulmonary disease) (HCC)    Dyspnea    Elevated lipids    Generalized headaches    History of pneumonia    Hyperlipidemia    Hypertension    Hypothyroidism    MVP (mitral valve prolapse)    palpitations   Osteopenia    Seasonal allergies    Sinusitis    Tubular adenoma of colon     PAST SURGICAL HISTORY: Past Surgical History:  Procedure Laterality Date   CATARACT EXTRACTION Left    COLONOSCOPY WITH PROPOFOL N/A 01/06/2016   Procedure: COLONOSCOPY WITH PROPOFOL;  Surgeon: Doran Stabler, MD;  Location: [facility];  Service: Gastroenterology;  Laterality: N/A;   DILATION AND CURETTAGE OF UTERUS     THYROIDECTOMY     TONSILLECTOMY     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   Procedure: RIGHT TOTAL HIP ARTHROPLASTY ANTERIOR APPROACH;  Surgeon: Mcarthur Rossetti, MD;  Location: [facility];  Service: Orthopedics;  Laterality: Right;  Needs RNFA    FAMILY HISTORY: Family History  Problem Relation Age of Onset   Heart attack Mother    Heart disease Mother    Dementia Mother    Healthy Father    Healthy Sister    Diabetes Brother    Heart attack Maternal Grandmother    Prostate cancer Maternal Grandfather    Breast cancer Maternal Aunt    Heart attack Other        NIECE   Colon cancer Neg Hx    Esophageal cancer Neg Hx    Rectal cancer Neg Hx    Stomach cancer Neg Hx     SOCIAL HISTORY: Social History   Socioeconomic History   Marital status: Widowed    Spouse name: Not on file   Number of children: 0   Years of education: 42 B   Highest education level: Not on file  Occupational History    Employer: [employer]  Tobacco Use   Smoking status: Former    Types: Cigarettes    Quit date: 07/24/2005    Years since quitting: 15.4   Smokeless tobacco: Never  Vaping Use   Vaping Use: Never used  Substance and Sexual Activity   Alcohol use: Yes    Comment: rare   Drug use: No   Sexual activity: Not on file  Other Topics Concern   Not on file   Social History Narrative   Lives alone   Social Determinants of Health   Financial Resource Strain: Low Risk    Difficulty of Paying Living Expenses: Not hard at all  Food Insecurity: No Food Insecurity   Worried About Charity fundraiser in the Last Year: Never true   Gore in the Last Year: Never true  Transportation Needs: No Transportation Needs  Lack of Transportation (Medical): No   Lack of Transportation (Non-Medical): No  Physical Activity: Inactive   Days of Exercise per Week: 0 days   Minutes of Exercise per Session: 0 min  Stress: No Stress Concern Present   Feeling of Stress : Only a little  Social Connections: Moderately Isolated   Frequency of Communication with Friends and Family: More than three times a week   Frequency of Social Gatherings with Friends and Family: More than three times a week   Attends Religious Services: More than 4 times per year   Active Member of [employer] or Organizations: No   Attends Archivist Meetings: Never   Marital Status: Widowed  Human resources officer Violence: Not At Risk   Fear of Current or Ex-Partner: No   Emotionally Abused: No   Physically Abused: No   Sexually Abused: No     PHYSICAL EXAM  GENERAL EXAM/CONSTITUTIONAL: Vitals:  Vitals:   01/11/21 1309  BP: 127/76  Pulse: 75  Weight: 171 lb 9.6 oz (77.8 kg)  Height: 5' 5.5" (1.664 m)   Body mass index is 28.12 kg/m. Wt Readings from Last 3 Encounters:  01/11/21 171 lb 9.6 oz (77.8 kg)  09/24/20 162 lb 7.7 oz (73.7 kg)  09/16/20 162 lb 7.7 oz (73.7 kg)   Patient is in no distress; well developed, nourished and groomed; neck is supple  CARDIOVASCULAR: Examination of carotid arteries is normal; no carotid bruits Regular rate and rhythm, no murmurs Examination of peripheral vascular system by observation and palpation is normal  EYES: Ophthalmoscopic exam of optic discs and posterior segments is normal; no papilledema or hemorrhages No results  found.  MUSCULOSKELETAL: Gait, strength, tone, movements noted in Neurologic exam below  NEUROLOGIC: MENTAL STATUS:  No flowsheet data found. awake, alert, oriented to person, place and time recent and remote memory intact normal attention and concentration language fluent, comprehension intact, naming intact fund of knowledge appropriate  CRANIAL NERVE:  2nd - no papilledema on fundoscopic exam 2nd, 3rd, 4th, 6th - pupils equal and reactive to light, visual fields full to confrontation, extraocular muscles intact, no nystagmus 5th - facial sensation symmetric 7th - facial strength symmetric 8th - hearing intact 9th - palate elevates symmetrically, uvula midline 11th - shoulder shrug symmetric 12th - tongue protrusion midline  MOTOR:  normal bulk and tone, full strength in the BUE, BLE; EXCEPT ATROPHY AND WEAKNESS OF INTRINSIC HAND MUSCLES ON RIGHT  SENSORY:  normal and symmetric to light touch, temperature, vibration; EXCEPT DECR IN RIGHT HAND DIGITS 4, 5  COORDINATION:  finger-nose-finger, fine finger movements normal  REFLEXES:  deep tendon reflexes 1+ and symmetric  GAIT/STATION:  narrow based gait     DIAGNOSTIC DATA (LABS, IMAGING, TESTING) - I reviewed patient records, labs, notes, testing and imaging myself where available.  Lab Results  Component Value Date   WBC 14.8 (H) 09/26/2020   HGB 11.8 (L) 09/26/2020   HCT 35.7 (L) 09/26/2020   MCV 89.5 09/26/2020   PLT 215 09/26/2020      Component Value Date/Time   NA 130 (L) 09/26/2020 0324   K 3.9 09/26/2020 0324   CL 100 09/26/2020 0324   CO2 23 09/26/2020 0324   GLUCOSE 127 (H) 09/26/2020 0324   BUN 19 09/26/2020 0324   CREATININE 0.96 09/26/2020 0324   CREATININE 0.78 08/04/2020 1230   CALCIUM 8.4 (L) 09/26/2020 0324   PROT 6.6 08/04/2020 1230   PROT 6.5 07/30/2017 0804   ALBUMIN 3.8 07/30/2017 0804  AST 16 08/04/2020 1230   ALT 12 08/04/2020 1230   ALKPHOS 51 07/30/2017 0804   BILITOT 0.6  08/04/2020 1230   BILITOT 0.5 07/30/2017 0804   GFRNONAA >60 09/26/2020 0324   GFRNONAA 80 03/12/2019 0837   GFRAA 92 03/12/2019 0837   Lab Results  Component Value Date   CHOL 144 08/04/2020   HDL 49 (L) 08/04/2020   LDLCALC 73 08/04/2020   TRIG 136 08/04/2020   CHOLHDL 2.9 08/04/2020   Lab Results  Component Value Date   HGBA1C 5.6 08/04/2020   Lab Results  Component Value Date   VITAMINB12 403 08/04/2020   Lab Results  Component Value Date   TSH 2.65 08/04/2020    06/16/20 MRI lumbar spine [I reviewed images myself and agree with interpretation. -[facility]]  1. Transitional lumbosacral anatomy. 2. Degenerative changes of the lumbar spine, not significantly changed from prior MRI. 3. Moderate spinal canal stenosis and moderate bilateral neural foraminal narrowing at L4-5. 4. Mild spinal canal stenosis at L2-3 and L3-4. 5. Moderate bilateral neural foraminal narrowing at L5-S1.    ASSESSMENT AND PLAN  75 y.o. year old female here with:   Dx:  1. Numbness and tingling in right hand       PLAN:  RIGHT HAND NUMBNESS (digits 4, 5) - check EMG/NCS for right ulnar neuropathy  HAND ARTHRITIS - conservative mgmt; consider PCP or rheumatology evaluation  NUMBNESS IN FEET / BACK PAIN - due to lumbar spinal stenosis  Orders Placed This Encounter  Procedures   NCV with EMG(electromyography)    Return for for NCV/EMG.    Penni Bombard, MD 75/17/0017, 4:94 PM Certified in Neurology, Neurophysiology and Neuroimaging  [facility] [address], [place], [place] [address] [phone]

## 2021-01-11 NOTE — Telephone Encounter (Signed)
Papers signed by Dr. Halford Chessman and faxed yesterday - 01/10/2021. Placed in "Pt. Assist. Papers" Folder in [facility] office until we hear of approval or denial

## 2021-01-12 ENCOUNTER — Other Ambulatory Visit: Payer: Self-pay

## 2021-01-12 NOTE — Telephone Encounter (Signed)
Mailed to patient per her request

## 2021-01-14 ENCOUNTER — Telehealth: Payer: Self-pay | Admitting: Orthopaedic Surgery

## 2021-01-14 NOTE — Telephone Encounter (Signed)
Err

## 2021-01-15 ENCOUNTER — Other Ambulatory Visit: Payer: Self-pay | Admitting: Family Medicine

## 2021-01-18 ENCOUNTER — Other Ambulatory Visit: Payer: Self-pay

## 2021-01-18 ENCOUNTER — Emergency Department (HOSPITAL_COMMUNITY)
Admission: EM | Admit: 2021-01-18 | Discharge: 2021-01-18 | Disposition: A | Discharge status: Home / Self Care | Payer: Medicare HMO | Attending: Emergency Medicine | Admitting: Emergency Medicine

## 2021-01-18 ENCOUNTER — Encounter (HOSPITAL_COMMUNITY): Payer: Self-pay

## 2021-01-18 ENCOUNTER — Emergency Department (HOSPITAL_COMMUNITY): Payer: Medicare HMO

## 2021-01-18 ENCOUNTER — Telehealth: Payer: Self-pay | Admitting: Family Medicine

## 2021-01-18 DIAGNOSIS — E039 Hypothyroidism, unspecified: Secondary | ICD-10-CM | POA: Insufficient documentation

## 2021-01-18 DIAGNOSIS — Z85038 Personal history of other malignant neoplasm of large intestine: Secondary | ICD-10-CM | POA: Diagnosis not present

## 2021-01-18 DIAGNOSIS — Z87891 Personal history of nicotine dependence: Secondary | ICD-10-CM | POA: Diagnosis not present

## 2021-01-18 DIAGNOSIS — Z79899 Other long term (current) drug therapy: Secondary | ICD-10-CM | POA: Diagnosis not present

## 2021-01-18 DIAGNOSIS — Z7951 Long term (current) use of inhaled steroids: Secondary | ICD-10-CM | POA: Insufficient documentation

## 2021-01-18 DIAGNOSIS — Z96641 Presence of right artificial hip joint: Secondary | ICD-10-CM | POA: Diagnosis not present

## 2021-01-18 DIAGNOSIS — J441 Chronic obstructive pulmonary disease with (acute) exacerbation: Secondary | ICD-10-CM | POA: Diagnosis not present

## 2021-01-18 DIAGNOSIS — I10 Essential (primary) hypertension: Secondary | ICD-10-CM | POA: Diagnosis not present

## 2021-01-18 DIAGNOSIS — Z20822 Contact with and (suspected) exposure to covid-19: Secondary | ICD-10-CM | POA: Diagnosis not present

## 2021-01-18 DIAGNOSIS — Z7901 Long term (current) use of anticoagulants: Secondary | ICD-10-CM | POA: Diagnosis not present

## 2021-01-18 DIAGNOSIS — R0602 Shortness of breath: Secondary | ICD-10-CM | POA: Diagnosis not present

## 2021-01-18 LAB — CBC
HCT: 40.1 % (ref 36.0–46.0)
Hemoglobin: 12.6 g/dL (ref 12.0–15.0)
MCH: 27.7 pg (ref 26.0–34.0)
MCHC: 31.4 g/dL (ref 30.0–36.0)
MCV: 88.1 fL (ref 80.0–100.0)
Platelets: 355 10*3/uL (ref 150–400)
RBC: 4.55 MIL/uL (ref 3.87–5.11)
RDW: 14.6 % (ref 11.5–15.5)
WBC: 7.6 10*3/uL (ref 4.0–10.5)
nRBC: 0 % (ref 0.0–0.2)

## 2021-01-18 LAB — PROTIME-INR
INR: 1.4 — ABNORMAL HIGH (ref 0.8–1.2)
Prothrombin Time: 17.5 seconds — ABNORMAL HIGH (ref 11.4–15.2)

## 2021-01-18 LAB — BASIC METABOLIC PANEL
Anion gap: 7 (ref 5–15)
BUN: 13 mg/dL (ref 8–23)
CO2: 26 mmol/L (ref 22–32)
Calcium: 8.8 mg/dL — ABNORMAL LOW (ref 8.9–10.3)
Chloride: 106 mmol/L (ref 98–111)
Creatinine, Ser: 0.69 mg/dL (ref 0.44–1.00)
GFR, Estimated: >60 mL/min (ref >60)
Glucose, Bld: 98 mg/dL (ref 70–99)
Potassium: 3.3 mmol/L — ABNORMAL LOW (ref 3.5–5.1)
Sodium: 139 mmol/L (ref 135–145)

## 2021-01-18 LAB — TROPONIN I (HIGH SENSITIVITY)
Troponin I (High Sensitivity): 2 ng/L (ref <18)
Troponin I (High Sensitivity): <2 ng/L (ref <18)

## 2021-01-18 LAB — RESP PANEL BY RT-PCR (FLU A&B, COVID) ARPGX2
Influenza A by PCR: NEGATIVE
Influenza B by PCR: NEGATIVE
SARS Coronavirus 2 by RT PCR: NEGATIVE

## 2021-01-18 MED ORDER — AMOXICILLIN-POT CLAVULANATE 875-125 MG PO TABS: 1.0000 | ORAL_TABLET | Freq: Two times a day (BID) | ORAL | 0 refills | Status: DC

## 2021-01-18 MED ORDER — PREDNISONE 20 MG PO TABS: 20.0000 mg | ORAL_TABLET | Freq: Two times a day (BID) | ORAL | 0 refills | Status: DC

## 2021-01-18 NOTE — ED Provider Notes (Signed)
[facility] Provider Note   CSN: [id number] Arrival date & time: 01/18/21  1111     History Chief Complaint  Patient presents with   Shortness of Breath    Mary Blevins is a 75 y.o. female.  HPI She is here for evaluation of shortness of breath with cough for several days.  Her cough is productive of yellow sputum.  She typically has a similar episode every wintertime.  She is using albuterol nebulizer at home along with her other medications including inhalers.  She denies fever, chills, weakness or dizziness.  There are no other known active modifying factors.    Past Medical History:  Diagnosis Date   Arthritis    COPD (chronic obstructive pulmonary disease) (HCC)    Dyspnea    Elevated lipids    Generalized headaches    History of pneumonia    Hyperlipidemia    Hypertension    Hypothyroidism    MVP (mitral valve prolapse)    palpitations   Osteopenia    Seasonal allergies    Sinusitis    Tubular adenoma of colon     Patient Active Problem List   Diagnosis Date Noted   Decreased urine output 09/26/2020    Class: Acute   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   Unilateral primary osteoarthritis, right hip 09/23/2020   Leg cramping 01/07/2020   GAD (generalized anxiety disorder) 12/20/2016   Chronic insomnia 12/20/2016   Aortic atherosclerosis ([place]) 06/09/2016   Benign neoplasm of ascending colon    Benign neoplasm of transverse colon    Benign neoplasm of sigmoid colon    Diverticulosis of sigmoid colon    Grade I internal hemorrhoids    PSVT (paroxysmal supraventricular tachycardia) ([place]) 02/17/2015   Back pain 07/22/2012   Acute pain of left hip 07/22/2012   Stricture and stenosis of esophagus 09/18/2011   Hx of colonic polyps 09/18/2011   COPD (chronic obstructive pulmonary disease) ([place]) 07/14/2010   Generalized headaches 07/14/2010   Osteopenia 07/14/2010   Hypothyroid 07/14/2010   Elevated lipids  07/14/2010    Past Surgical History:  Procedure Laterality Date   CATARACT EXTRACTION Left    COLONOSCOPY WITH PROPOFOL N/A 01/06/2016   Procedure: COLONOSCOPY WITH PROPOFOL;  Surgeon: Doran Stabler, MD;  Location: [facility];  Service: Gastroenterology;  Laterality: N/A;   DILATION AND CURETTAGE OF UTERUS     THYROIDECTOMY     TONSILLECTOMY     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   Procedure: RIGHT TOTAL HIP ARTHROPLASTY ANTERIOR APPROACH;  Surgeon: Mcarthur Rossetti, MD;  Location: [facility];  Service: Orthopedics;  Laterality: Right;  Needs RNFA     OB History   No obstetric history on file.     Family History  Problem Relation Age of Onset   Heart attack Mother    Heart disease Mother    Dementia Mother    Healthy Father    Healthy Sister    Diabetes Brother    Heart attack Maternal Grandmother    Prostate cancer Maternal Grandfather    Breast cancer Maternal Aunt    Heart attack Other        NIECE   Colon cancer Neg Hx    Esophageal cancer Neg Hx    Rectal cancer Neg Hx    Stomach cancer Neg Hx     Social History   Tobacco Use   Smoking status: Former    Types: Cigarettes  Quit date: 07/24/2005    Years since quitting: 15.5   Smokeless tobacco: Never  Vaping Use   Vaping Use: Never used  Substance Use Topics   Alcohol use: Yes    Comment: rare   Drug use: No    Home Medications Prior to Admission medications   Medication Sig Start Date End Date Taking? Authorizing Provider  amoxicillin-clavulanate (AUGMENTIN) 875-125 MG tablet Take 1 tablet by mouth 2 (two) times daily. One po bid x 7 days 01/18/21  Yes Daleen Bo, MD  predniSONE (DELTASONE) 20 MG tablet Take 1 tablet (20 mg total) by mouth 2 (two) times daily. 01/18/21  Yes Daleen Bo, MD  albuterol (PROVENTIL) (2.5 MG/3ML) 0.083% nebulizer solution Take 3 mLs (2.5 mg total) by nebulization every 6 (six) hours as needed for wheezing or shortness of breath. Patient taking  differently: Take 2.5 mg by nebulization daily. 06/03/20   [name], MD  ALPRAZolam Duanne Moron) 0.5 MG tablet Take 1 tablet (0.5 mg total) by mouth 3 (three) times daily as needed. Patient not taking: Reported on 01/11/2021 01/28/19   [name], MD  apixaban (ELIQUIS) 5 MG TABS tablet Take 1 tablet (5 mg total) by mouth 2 (two) times daily. 10/27/20   Mcarthur Rossetti, MD  budesonide-formoterol [facility]) 160-4.5 MCG/ACT inhaler Inhale 2 puffs into the lungs 2 (two) times daily. 01/06/21   [name], MD  DULoxetine (CYMBALTA) 60 MG capsule  11/09/20   [provider]  fluticasone (FLONASE) 50 MCG/ACT nasal spray SPRAY 2 SPRAYS INTO EACH NOSTRIL EVERY DAY 07/11/18   [name], MD  HYDROcodone bit-homatropine (HYCODAN) 5-1.5 MG/5ML syrup Take 5 mLs by mouth every 8 (eight) hours as needed for cough. 07/15/20   [name], MD  HYDROcodone-acetaminophen (NORCO) 5-325 MG tablet Take 1 tablet by mouth every 6 (six) hours as needed for moderate pain. 12/15/20   Pete Pelt, PA-C  HYDROcodone-acetaminophen (NORCO/VICODIN) 5-325 MG tablet Take 1 tablet by mouth every 6 (six) hours as needed for moderate pain. 11/11/20   Mcarthur Rossetti, MD  hydrOXYzine (ATARAX/VISTARIL) 25 MG tablet Take 1 tablet (25 mg total) by mouth 3 (three) times daily as needed for itching. 10/13/20   [name], MD  levothyroxine (SYNTHROID) 50 MCG tablet TAKE 1 TABLET BY MOUTH EVERY DAY BEFORE BREAKFAST 01/18/21   [name], MD  NEURONTIN 300 MG capsule Take 300 mg by mouth at bedtime. 11/10/20   [provider]  OVER THE COUNTER MEDICATION Apply 1 application topically 2 (two) times daily. Stop pain roll-on    [provider]  Propylene Glycol-Glycerin 0.6-0.6 % SOLN Place 1 drop into both eyes 2 (two) times daily. Artifical tears    [provider]  rosuvastatin (CRESTOR) 5 MG tablet Take 1 tablet (5 mg total) by mouth daily. 09/17/20    Belva Crome, MD  sodium chloride (OCEAN) 0.65 % SOLN nasal spray Place 1 spray into both nostrils daily.    [provider]  sulfamethoxazole-trimethoprim (BACTRIM DS) 800-160 MG tablet Take 1 tablet by mouth 2 (two) times daily. 10/08/20   Leandrew Koyanagi, MD  Tiotropium Bromide Monohydrate (SPIRIVA RESPIMAT) 2.5 MCG/ACT AERS Inhale 2 puffs into the lungs in the morning and at bedtime. 12/13/20   [name], MD  Tiotropium Bromide Monohydrate (SPIRIVA RESPIMAT) 2.5 MCG/ACT AERS Inhale 2 puffs into the lungs daily. 12/31/20   Chesley Mires, MD  tiZANidine (ZANAFLEX) 4 MG tablet TAKE 1 TABLET BY  MOUTH EVERY 6 HOURS AS NEEDED FOR MUSCLE SPASMS. 12/29/20   [name], MD  verapamil (CALAN-SR) 120 MG CR tablet TAKE 1 TABLET EVERY DAY 11/23/20   [name], MD    Allergies    Meloxicam  Review of Systems   Review of Systems  All other systems reviewed and are negative.  Physical Exam Updated Vital Signs BP (!) 156/84    Pulse 75    Temp 98.5 F (36.9 C) (Oral)    Resp (!) 21    Ht 5\' 5"  (1.651 m)    Wt 77.6 kg    SpO2 98%    BMI 28.46 kg/m   Physical Exam Vitals and nursing note reviewed.  Constitutional:      General: She is not in acute distress.    Appearance: She is well-developed. She is not ill-appearing or diaphoretic.  HENT:     Head: Normocephalic and atraumatic.     Right Ear: External ear normal.     Left Ear: External ear normal.     Mouth/Throat:     Pharynx: No pharyngeal swelling.  Eyes:     Conjunctiva/sclera: Conjunctivae normal.     Pupils: Pupils are equal, round, and reactive to light.  Neck:     Trachea: Phonation normal.  Cardiovascular:     Rate and Rhythm: Normal rate and regular rhythm.     Heart sounds: Normal heart sounds.  Pulmonary:     Effort: Pulmonary effort is normal.     Comments: Slightly diminished air movement with scattered rhonchi but no wheezes.  No increased work of breathing. Abdominal:     Palpations:  Abdomen is soft.     Tenderness: There is no abdominal tenderness.  Musculoskeletal:        General: Normal range of motion.     Cervical back: Normal range of motion and neck supple.  Skin:    General: Skin is warm and dry.  Neurological:     Mental Status: She is alert and oriented to person, place, and time.     Cranial Nerves: No cranial nerve deficit.     Sensory: No sensory deficit.     Motor: No abnormal muscle tone.     Coordination: Coordination normal.  Psychiatric:        Mood and Affect: Mood normal.        Behavior: Behavior normal.        Thought Content: Thought content normal.        Judgment: Judgment normal.    ED Results / Procedures / Treatments   Labs (all labs ordered are listed, but only abnormal results are displayed) Labs Reviewed  BASIC METABOLIC PANEL - Abnormal; Notable for the following components:      Result Value   Potassium 3.3 (*)    Calcium 8.8 (*)    All other components within normal limits  PROTIME-INR - Abnormal; Notable for the following components:   Prothrombin Time 17.5 (*)    INR 1.4 (*)    All other components within normal limits  RESP PANEL BY RT-PCR (FLU A&B, COVID) ARPGX2  CBC  TROPONIN I (HIGH SENSITIVITY)  TROPONIN I (HIGH SENSITIVITY)    EKG EKG Interpretation  Date/Time:  Tuesday January 18 2021 11:55:14 EST Ventricular Rate:  73 PR Interval:  130 QRS Duration: 72 QT Interval:  372 QTC Calculation: 409 R Axis:   89 Text Interpretation: Normal sinus rhythm Low voltage QRS Septal infarct , age undetermined Abnormal ECG  since last tracing no significant change Confirmed by Daleen Bo [phone]) on 01/18/2021 1:54:04 PM  Radiology DG Chest 2 View  Result Date: 01/18/2021 CLINICAL DATA:  Shortness of Breath EXAM: CHEST - 2 VIEW COMPARISON:  August 31, 2020. FINDINGS: No consolidation. Chronic hyperinflation. No visible pleural effusions or pneumothorax. Biapical pleuroparenchymal scarring. Cardiomediastinal  silhouette is within normal limits and similar to prior. Similar mild compression deformity of a lower thoracic vertebral body. Osteopenia. IMPRESSION: 1. No evidence of acute cardiopulmonary disease. 2. Chronic hyperinflation, compatible with emphysema. Electronically Signed   By: Margaretha Sheffield M.D.   On: 01/18/2021 12:37    Procedures Procedures   Medications Ordered in ED Medications - No data to display  ED Course  I have reviewed the triage vital signs and the nursing notes.  Pertinent labs & imaging results that were available during my care of the patient were reviewed by me and considered in my medical decision making (see chart for details).    MDM Rules/Calculators/A&P                          Patient Vitals for the past 24 hrs:  BP Temp Temp src Pulse Resp SpO2  01/18/21 1430 -- 98.5 F (36.9 C) Oral -- -- --  01/18/21 1340 (!) 156/84 -- -- 75 (!) 21 98 %    At the time of discharge- reevaluation with update and discussion. After initial assessment and treatment, an updated evaluation reveals she is comfortable has no further complaints. Daleen Bo   Medical Decision Making:  This patient is presenting for evaluation of cough with sputum production, which does require a range of treatment options, and is a complaint that involves a moderate risk of morbidity and mortality. The differential diagnoses include pneumonia, viral infection, bronchitis, COPD exacerbation. I decided to review old records, and in summary elderly female with history of COPD, PSVT, diverticulosis, benign colon disorder and osteoarthritis.  I did not require additional historical information from anyone.  Clinical Laboratory Tests Ordered, included CBC, Metabolic panel, and troponin, INR, viral panel . Review indicates normal except potassium low, PT/INR increased. Radiologic Tests Ordered, included chest x-ray.  I independently Visualized: Radiographic images, which show no infiltrate or  edema  Cardiac Monitor Tracing which shows sinus rhythm     Critical Interventions-total evaluation, laboratory testing, radiography, EKG, cardiac monitoring, observation and reassessment  After These Interventions, the Patient was reevaluated and was found stable for discharge.  Patient with signs and symptoms of COPD exacerbation, which is mild.  No increased work of breathing.  Doubt ACS, acute viral process or metabolic disorders requiring intervention.  She does not require further ED intervention or hospitalization at this time.  Prescription medication supplied to treat symptoms and decrease morbidity.  CRITICAL CARE-no Performed by: Daleen Bo  Nursing Notes Reviewed/ Care Coordinated Applicable Imaging Reviewed Interpretation of Laboratory Data incorporated into ED treatment  The patient appears reasonably screened and/or stabilized for discharge and I doubt any other medical condition or other [facility] requiring further screening, evaluation, or treatment in the ED at this time prior to discharge.  Plan: Home Medications-continue usual; Home Treatments-rest, fluids, gradually improve activity levels; return here if the recommended treatment, does not improve the symptoms; Recommended follow up-PCP follow-up 1 week and as needed        Final Clinical Impression(s) / ED Diagnoses Final diagnoses:  COPD exacerbation ([place])    Rx / DC Orders ED Discharge Orders  Ordered    predniSONE (DELTASONE) 20 MG tablet  2 times daily        01/18/21 1410    amoxicillin-clavulanate (AUGMENTIN) 875-125 MG tablet  2 times daily        01/18/21 1410             Daleen Bo, MD 01/19/21 1208

## 2021-01-18 NOTE — ED Provider Notes (Signed)
Emergency Medicine Provider Triage Evaluation Note  Mary Blevins , a 75 y.o. female  was evaluated in triage.  Pt complains of 5-day history of exertional shortness of breath with wheezing.  She has been coughing additionally, clear but copious sputum production.  She denies fevers or chills, chest pain, nasal congestion, myalgias.  She has been using her albuterol nebulizer, also on Symbicort, last neb treatment was yesterday evening.  Review of Systems  Positive: Cough, shortness of breath, wheezing, congestion Negative: Fevers, chills, chest pain, peripheral edema  Physical Exam  BP (!) 157/81 (BP Location: Right Arm)    Pulse 85    Temp 98.4 F (36.9 C) (Oral)    Resp 18    Ht 5\' 5"  (1.651 m)    Wt 77.6 kg    SpO2 95%    BMI 28.46 kg/m  Gen:   Awake, no distress   Resp:  Normal effort, no wheezing currently MSK:   Moves extremities without difficulty, no peripheral edema noted Other:    Medical Decision Making  Medically screening exam initiated at 1:01 PM.  Appropriate orders placed.  Mary Blevins was informed that the remainder of the evaluation will be completed by another provider, this initial triage assessment does not replace that evaluation, and the importance of remaining in the ED until their evaluation is complete.  Patient has been medically screened, her chest x-ray is reassuring, other labs are currently pending.  Respiratory panel was added.  Of note, patient is both COVID and flu vaccinated.   Mary Jefferson, PA-C 01/18/21 1304    Mary Bo, MD 01/19/21 [phone]

## 2021-01-18 NOTE — ED Notes (Signed)
Patient verbalizes understanding of discharge instructions. Opportunity for questioning and answers were provided. Armband removed by staff, pt discharged from ED to home via POV  

## 2021-01-18 NOTE — Discharge Instructions (Signed)
Continue to use all of your medicines as usual.  Call your doctor for a follow-up appointment if not better in a few days.

## 2021-01-18 NOTE — Telephone Encounter (Signed)
Patient called to report sx; coughing up phlegm, wheezing, and short-winded. Patient requesting provider call in medication for her; stated urgent care doesn't accept insurance.  Please advise at [phone] or [phone].

## 2021-01-18 NOTE — Telephone Encounter (Signed)
Patient requesting to be added to schedule today if possible.  Also following up on refill request for levothyroxine (SYNTHROID) 50 MCG tablet [[id number]]   Automated system states refill sent. When patient calls pharmacy, told refill not in system.  Please advise at [phone].

## 2021-01-18 NOTE — ED Triage Notes (Signed)
Complaints of shortness of breath and cough that started 5 days prior.

## 2021-01-19 ENCOUNTER — Telehealth: Payer: Self-pay

## 2021-01-19 NOTE — Telephone Encounter (Signed)
Pt called in stating that she did go to the ER yesterday. Pt states that she was given amoxicillin and prednisone as she was told in the ER that her bronchitis had began to flare up. Pt just wanted to make pcp aware. Please advise.  Cb#: [phone]

## 2021-01-19 NOTE — Telephone Encounter (Signed)
Noted  

## 2021-01-28 ENCOUNTER — Telehealth: Payer: Self-pay

## 2021-01-28 ENCOUNTER — Other Ambulatory Visit: Payer: Self-pay | Admitting: Family Medicine

## 2021-01-28 NOTE — Telephone Encounter (Signed)
Pt assistance enrollment was approved by [employer]. Called to notify patient. Left detailed message on VM (ok per DPR)  Advised her to call back if she had any questions.

## 2021-02-01 ENCOUNTER — Telehealth: Payer: Self-pay | Admitting: Pulmonary Disease

## 2021-02-01 NOTE — Telephone Encounter (Signed)
ATC patient. Left message on VM giving her my call back number and also the [employer] number through her patient assistance number [phone]. Advised patient to call one of [place] so we can help answer her questions.

## 2021-02-06 ENCOUNTER — Other Ambulatory Visit: Payer: Self-pay | Admitting: Family Medicine

## 2021-02-15 DIAGNOSIS — Z79899 Other long term (current) drug therapy: Secondary | ICD-10-CM | POA: Diagnosis not present

## 2021-02-15 DIAGNOSIS — M545 Low back pain, unspecified: Secondary | ICD-10-CM | POA: Diagnosis not present

## 2021-02-15 DIAGNOSIS — R030 Elevated blood-pressure reading, without diagnosis of hypertension: Secondary | ICD-10-CM | POA: Diagnosis not present

## 2021-02-16 ENCOUNTER — Telehealth: Payer: Self-pay | Admitting: Family Medicine

## 2021-02-16 NOTE — Telephone Encounter (Signed)
Patient called to request additional samples of Eliquis.   Please advise at [phone].

## 2021-02-16 NOTE — Telephone Encounter (Signed)
Called LVM for patient to return call to [phone].

## 2021-02-17 ENCOUNTER — Telehealth: Payer: Self-pay | Admitting: *Deleted

## 2021-02-17 ENCOUNTER — Telehealth: Payer: Self-pay | Admitting: Orthopaedic Surgery

## 2021-02-17 NOTE — Telephone Encounter (Signed)
Patient wants to know if it is okay to reduce her dosage of Elloquis 1x a day until she gets paid due to her being on a fixed income. Please advise.

## 2021-02-17 NOTE — Telephone Encounter (Signed)
Patient left me a VM asking about her Eliquis. She has enough to last her until next Tuesday, but she asked if she could just take 1 per day at this point to last her until she has money to cover more. Does she still need Eliquis for the DVT at this time? Does she need repeat dopplers to see if DVT has resolved? I don't know if that happened. I can't see results if so.Thanks.

## 2021-02-18 ENCOUNTER — Other Ambulatory Visit: Payer: Self-pay | Admitting: *Deleted

## 2021-02-18 DIAGNOSIS — Z96641 Presence of right artificial hip joint: Secondary | ICD-10-CM

## 2021-02-21 ENCOUNTER — Telehealth: Payer: Self-pay | Admitting: *Deleted

## 2021-02-21 ENCOUNTER — Other Ambulatory Visit: Payer: Self-pay

## 2021-02-21 ENCOUNTER — Ambulatory Visit (HOSPITAL_COMMUNITY)
Admission: RE | Admit: 2021-02-21 | Discharge: 2021-02-21 | Disposition: A | Discharge status: Home / Self Care | Payer: Medicare HMO | Source: Ambulatory Visit | Attending: Orthopaedic Surgery | Admitting: Orthopaedic Surgery

## 2021-02-21 DIAGNOSIS — Z96641 Presence of right artificial hip joint: Secondary | ICD-10-CM | POA: Insufficient documentation

## 2021-02-21 DIAGNOSIS — Z86718 Personal history of other venous thrombosis and embolism: Secondary | ICD-10-CM | POA: Diagnosis not present

## 2021-02-21 NOTE — Telephone Encounter (Signed)
Patient had bilateral lower extremity dopplers done today. Per chart, looks negative for DVT. She asked if she could stop her Eliquis at this time. Thanks.

## 2021-02-21 NOTE — Telephone Encounter (Signed)
Patient called and updated that she can now stop Eliquis due to negative for DVT.

## 2021-02-23 ENCOUNTER — Ambulatory Visit: Payer: Medicare HMO | Admitting: Interventional Cardiology

## 2021-02-24 ENCOUNTER — Telehealth: Payer: Self-pay | Admitting: Pulmonary Disease

## 2021-02-24 NOTE — Telephone Encounter (Signed)
Lm for patient.  

## 2021-02-24 NOTE — Telephone Encounter (Signed)
Primary Pulmonologist: Dr. Halford Chessman  Last office visit and with whom: Dr. Halford Chessman 06/30/2020 What do we see them for (pulmonary problems): emphysema  Last OV assessment/plan: see below  Was appointment offered to patient (explain)?  No. None available in [facility] office. Pt unable to come to [facility] office    Reason for call: pt states she is extremely SOB and coughing up a lot of mucus clear with yellow tint. Using rescue inhaler for SOB. No fevers.  Neg covid and flu tests. Wants to know if there are any other recommendations of what she take to help.   Wants to be called back at [phone]   Will route to TP since VS is not available. Rexene Edison NP please advise. Thanks!   Allergies  Allergen Reactions   Meloxicam Other (See Comments)    BAD DREAMS, SAD THOUGHTS    Immunization History  Administered Date(s) Administered   Fluad Quad(high Dose 65+) 10/08/2018, 10/16/2019, 11/18/2020   Hepatitis B 12/06/1990, 01/03/1991, 07/07/1991   Influenza Split 10/17/2013   Influenza Whole 10/23/2008   Influenza, High Dose Seasonal PF 09/26/2016, 10/12/2017   Influenza,inj,Quad PF,6+ Mos 10/29/2012, 10/28/2015   Influenza,inj,quad, With Preservative 10/30/2018   Influenza-Unspecified 10/05/2014   PFIZER(Purple Top)SARS-COV-2 Vaccination 06/09/2019, 06/30/2019, 02/06/2020   Pfizer Covid-19 Vaccine Bivalent Booster 63yrs & up 12/04/2020   Pneumococcal Conjugate-13 03/23/2013   Pneumococcal Polysaccharide-23 12/21/2005, 07/25/2010   Td 03/13/1996   Tdap 08/12/2010   Zoster, Live 07/12/2010     Assessment/Plan:    COPD with emphysema. - tried trelegy but wasn't effective - will have her try breztri for two weeks; samples given - if this works okay and is covered by insurance, then she is to call office in two weeks for refill of breztri - otherwise she will resume spiriva and symbicort after breztri sample completed - continue prn albuterol - prn mucinex   Right ankle swelling. - suspect she has  ankle sprain related to change in gait from hip pain  - f/u with primary care and orthopedics   Time Spent Involved in Patient Care on Day of Examination:  23 minutes   Follow up:  Patient Instructions  Try sample of breztri for two weeks.  Use two puffs in the morning and two puffs in the evening, and rinse your mouth after each use.   Don't use spiriva and symbicort while using breztri.   Check with your insurance plan formulary to see if breztri is covered.  If it is and you feel this works as well as spiriva and symbicort, then call the office in two weeks to get a refill of breztri.  Otherwise resume spiriva and symbicort.   Follow up in 1 year

## 2021-02-24 NOTE — Telephone Encounter (Signed)
Patient is aware of recommendations and voiced her understanding.  Offered OV for [place] and patient declined. She prefers to be seen in [place] only.  Appt scheduled 02/25/2021 at 9:00 with Dr. Melvyn Novas. Nothing further needed.   Routing to Dr. Melvyn Novas as an Juluis Rainier.

## 2021-02-24 NOTE — Telephone Encounter (Signed)
Called and notified patient to bring in all meds and neb solutions. Nothing further needed.

## 2021-02-24 NOTE — Telephone Encounter (Signed)
Glad to see in office today for evaluation  Will need to be seen  Please contact office for sooner follow up if symptoms do not improve or worsen or seek emergency care '

## 2021-02-24 NOTE — Telephone Encounter (Signed)
Make sure she brings all meds with her - include solutions for neb/solutions so we can better troubleshoot her problem

## 2021-02-25 ENCOUNTER — Ambulatory Visit (INDEPENDENT_AMBULATORY_CARE_PROVIDER_SITE_OTHER): Payer: Medicare HMO | Admitting: Internal Medicine

## 2021-02-25 ENCOUNTER — Encounter: Payer: Self-pay | Admitting: Internal Medicine

## 2021-02-25 ENCOUNTER — Other Ambulatory Visit: Payer: Self-pay

## 2021-02-25 DIAGNOSIS — J441 Chronic obstructive pulmonary disease with (acute) exacerbation: Secondary | ICD-10-CM

## 2021-02-25 MED ORDER — PREDNISONE 10 MG PO TABS: ORAL_TABLET | ORAL | 0 refills | Status: DC

## 2021-02-25 MED ORDER — SPIRIVA RESPIMAT 2.5 MCG/ACT IN AERS: 2.0000 | INHALATION_SPRAY | Freq: Every day | RESPIRATORY_TRACT | 0 refills | Status: DC

## 2021-02-25 NOTE — Assessment & Plan Note (Addendum)
Quit smoking 2007 with criteria for GOLD 2/3 with atypical exp f/v loop 03/01/17  - 02/25/2021  After extensive coaching inhaler device,  effectiveness =  75% from a baseline of 10% at most (very short Ti)    Having frequent flares of copd  - DDX of  difficult airways management almost all start with A and  include Adherence, Ace Inhibitors, Acid Reflux, Active Sinus Diseas bunch of PE'se, Alpha 1 Antitripsin deficiency, Anxiety masquerading as Airways dz,  ABPA,  Allergy(esp in young), Aspiration (esp in elderly), Adverse effects of meds,  Active smoking or vaping, A bunch of PE's (a small clot burden can't cause this syndrome unless there is already severe underlying pulm or vascular dz with poor reserve) plus two Bs  = Bronchiectasis and Beta blocker use..and one C= CHF  Adherence is always the initial "prime suspect" and is a multilayered concern that requires a "trust but verify" approach in every patient - starting with knowing how to use medications, especially inhalers, correctly, keeping up with refills and understanding the fundamental difference between maintenance and prns vs those medications only taken for a very short course and then stopped and not refilled.  - did med reconciliation today - see hfa training   ? Allergy/asthma component > Prednisone 10 mg take  4 each am x 2 days,   2 each am x 2 days,  1 each am x 2 days and stop and try to bet more of the ICS in symbicort on target by better hfa technique  ? Acid (or non-acid) GERD > always difficult to exclude as up to 75% of pts in some series report no assoc GI/ Heartburn symptoms> if not improving p above rec trial of ppi /h2hs   ? A bunch of PEs > onset of symptoms well before d/c DOAC with neg venous dopplers Feb 21 2021 so unlikely   ? chf > ht size ok on cxr   Each maintenance medication was reviewed in detail including emphasizing most importantly the difference between maintenance and prns and under what circumstances the  prns are to be triggered using an action plan format where appropriate.  Total time for H and P, chart review, counseling, reviewing hfa/neb device(s) , directly observing portions of ambulatory 02 saturation study/ and generating customized AVS unique to this acute  office visit / same day charting > 40 min with pt new to me

## 2021-02-25 NOTE — Patient Instructions (Addendum)
Work on inhaler technique:  relax and gently blow all the way out then take a nice smooth full deep breath back in, triggering the inhaler at same time you start breathing in.  Hold for up to 5 seconds if you can. Blow symbicort out thru nose. Rinse and gargle with water when done.  If mouth or throat bother you at all,  try brushing teeth/gums/tongue with arm and hammer toothpaste/ make a slurry and gargle and spit out.   Prednisone 10 mg take  4 each am x 2 days,   2 each am x 2 days,  1 each am x 2 days and stop   Ok to use the albuterol up to every 4 hours if can't catch your breath  Call if not improving for f/u 1st of next week Late add may need to try empirical gerd rx if not responding to pred rx with better device use

## 2021-02-25 NOTE — Progress Notes (Signed)
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 SOB. Neg flu covid tests. Using rescue inhaler more often   Flared up around Christmas and seen in ER 01/18/21 rx prednisone//augmentin Dyspnea:  worse again x 10d  - note stopped DOAC 2 d prior to OV  (after onset of doe )  Cough: "the usual yellow mucus"  daytime  Sleep: in recliner x 4 months s nocturnal cc  SABA use: helps some but hfa poor and hasn't tried neb  02 has it not using it   No obvious day to day or daytime variability or assoc excess/ purulent sputum or mucus plugs or hemoptysis or cp or chest tightness, subjective wheeze or overt sinus or hb symptoms.   Sleeping  without nocturnal  or early am exacerbation  of respiratory  c/o's or need for noct saba. Also denies any obvious fluctuation of symptoms with weather or environmental changes or other aggravating or alleviating factors except as outlined above   No unusual exposure hx or h/o childhood pna/ asthma or knowledge of premature birth.  Current Allergies, Complete Past Medical History, Past Surgical History, Family History, and Social History were reviewed in [employer] record.  ROS  The following are not active complaints unless bolded Hoarseness, sore throat, dysphagia, dental problems, itching, sneezing,  nasal congestion or discharge of excess mucus or purulent secretions, ear ache,   fever, chills, sweats, unintended wt loss or wt gain, classically pleuritic or exertional cp,  orthopnea pnd or arm/hand  swelling  or leg swelling, presyncope, palpitations, abdominal pain, anorexia, nausea, vomiting, diarrhea  or change in bowel habits or change in bladder habits, change in stools or change in urine, dysuria, hematuria,  rash, arthralgias, visual complaints, headache, numbness, weakness or ataxia or problems with walking uses can  or coordination,  change in mood or  memory.           Past Medical History:  Diagnosis Date   Arthritis    COPD (chronic obstructive pulmonary disease) (HCC)    Dyspnea    Elevated lipids    Generalized headaches    History of pneumonia    Hyperlipidemia    Hypertension    Hypothyroidism    MVP (mitral valve prolapse)    palpitations   Osteopenia    Seasonal allergies    Sinusitis    Tubular adenoma of colon     Outpatient Medications Prior to Visit  Medication Sig Dispense Refill   albuterol (PROVENTIL) (2.5 MG/3ML) 0.083% nebulizer solution Take 3 mLs (2.5 mg total) by nebulization every 6 (six) hours as needed for wheezing or shortness of breath. (Patient taking differently: Take 2.5 mg by nebulization daily.) 360 mL 3   budesonide-formoterol (SYMBICORT) 160-4.5 MCG/ACT inhaler Inhale 2 puffs into the lungs 2 (two) times daily. 1 each 5   fluticasone (FLONASE) 50 MCG/ACT nasal spray SPRAY 2 SPRAYS INTO EACH NOSTRIL EVERY DAY 16 g 2   HYDROcodone-acetaminophen (NORCO) 5-325  MG tablet Take 1 tablet by mouth every 6 (six) hours as needed for moderate pain. 30 tablet 0   hydrOXYzine (ATARAX/VISTARIL) 25 MG tablet Take 1 tablet (25 mg total) by mouth 3 (three) times daily as needed for itching. 30 tablet 6   levothyroxine (SYNTHROID) 50 MCG tablet TAKE 1 TABLET BY MOUTH EVERY DAY BEFORE BREAKFAST 90 tablet 3   Propylene Glycol-Glycerin 0.6-0.6 % SOLN Place 1 drop into both eyes 2 (two) times daily. Artifical tears     rosuvastatin (CRESTOR) 5 MG tablet Take 1 tablet (5 mg total) by mouth daily. 90 tablet 3   sodium chloride (OCEAN) 0.65 % SOLN nasal spray  Place 1 spray into both nostrils daily.     SPIRIVA RESPIMAT 2.5 MCG/ACT AERS INHALE 2 PUFFS INTO THE LUNGS DAILY. 4 g 3   Tiotropium Bromide Monohydrate (SPIRIVA RESPIMAT) 2.5 MCG/ACT AERS Inhale 2 puffs into the lungs daily. 4 g 0   tiZANidine (ZANAFLEX) 4 MG tablet TAKE 1 TABLET BY MOUTH EVERY 6 HOURS AS NEEDED FOR MUSCLE SPASMS. 30 tablet 0   verapamil (CALAN-SR) 120 MG CR tablet TAKE 1 TABLET EVERY DAY 90 tablet 3          DULoxetine (CYMBALTA) 60 MG capsule  (Patient not taking: Reported on 02/25/2021)     ALPRAZolam (XANAX) 0.5 MG tablet Take 1 tablet (0.5 mg total) by mouth 3 (three) times daily as needed. (Patient not taking: Reported on 01/11/2021) 30 tablet 0   apixaban (ELIQUIS) 5 MG TABS tablet Take 1 tablet (5 mg total) by mouth 2 (two) times daily. 60 tablet 3   HYDROcodone bit-homatropine (HYCODAN) 5-1.5 MG/5ML syrup Take 5 mLs by mouth every 8 (eight) hours as needed for cough. 120 mL 0   HYDROcodone-acetaminophen (NORCO/VICODIN) 5-325 MG tablet Take 1 tablet by mouth every 6 (six) hours as needed for moderate pain. 30 tablet 0   NEURONTIN 300 MG capsule Take 300 mg by mouth at bedtime.     OVER THE COUNTER MEDICATION Apply 1 application topically 2 (two) times daily. Stop pain roll-on                   Tiotropium Bromide Monohydrate (SPIRIVA RESPIMAT) 2.5 MCG/ACT AERS Inhale 2 puffs into the lungs in the morning and at bedtime. 3 each 4   No facility-administered medications prior to visit.     Objective:     BP 132/82 (BP Location: Left Arm, Patient Position: Sitting)    Pulse 84    Temp 98.2 F (36.8 C) (Temporal)    Ht 5' 5.5" (1.664 m)    Wt 164 lb (74.4 kg)    SpO2 98% Comment: ra   BMI 26.88 kg/m   SpO2: 98 % (ra)   Slt hoarse amb bf/ occ throat clearing    HEENT : pt wearing mask not removed for exam due to covid - 19 concerns.    NECK :  without JVD/Nodes/TM/ nl carotid upstrokes bilaterally   LUNGS: no acc muscle use,  Mild barrel  contour chest wall  with bilateral  Distant bs s audible wheeze and  without cough on insp or exp maneuvers  and mild  Hyperresonant  to  percussion bilaterally     CV:  RRR  no s3 or murmur or increase in P2, and no edema   ABD:  soft and nontender with pos end  insp Hoover's  in the supine position. No bruits or organomegaly appreciated, bowel sounds nl  MS:  walks with cane ext warm without deformities, calf tenderness, cyanosis or clubbing No obvious joint restrictions   SKIN: warm and dry without lesions    NEURO:  alert, approp, nl sensorium with  no motor or cerebellar deficits apparent.        I personally reviewed images and agree with radiology impression as follows:  CXR:  pa and lateral 01/18/21 1. No evidence of acute cardiopulmonary disease. 2. Chronic hyperinflation, compatible with emphysema      Assessment   COPD with acute exacerbation ([place]) Quit smoking 2007 with criteria for GOLD 2/3 with atypical exp f/v loop 03/01/17  - 02/25/2021  After extensive coaching inhaler device,  effectiveness =  75% from a baseline of 10% at most (very short Ti)    Having frequent flares of copd  - DDX of  difficult airways management almost all start with A and  include Adherence, Ace Inhibitors, Acid Reflux, Active Sinus Diseas bunch of PE'se, Alpha 1 Antitripsin deficiency, Anxiety masquerading as Airways dz,  ABPA,  Allergy(esp in young), Aspiration (esp in elderly), Adverse effects of meds,  Active smoking or vaping, A bunch of PE's (a small clot burden can't cause this syndrome unless there is already severe underlying pulm or vascular dz with poor reserve) plus two Bs  = Bronchiectasis and Beta blocker use..and one C= CHF  Adherence is always the initial "prime suspect" and is a multilayered concern that requires a "trust but verify" approach in every patient - starting with knowing how to use medications, especially inhalers, correctly, keeping up with refills and understanding the fundamental  difference between maintenance and prns vs those medications only taken for a very short course and then stopped and not refilled.  - did med reconciliation today - see hfa training   ? Allergy/asthma component > Prednisone 10 mg take  4 each am x 2 days,   2 each am x 2 days,  1 each am x 2 days and stop and try to bet more of the ICS in symbicort on target by better hfa technique  ? Acid (or non-acid) GERD > always difficult to exclude as up to 75% of pts in some series report no assoc GI/ Heartburn symptoms> if not improving p above rec trial of ppi /h2hs   ? A bunch of PEs > onset of symptoms well before d/c DOAC with neg venous dopplers Feb 21 2021 so unlikely   ? chf > ht size ok on cxr   Each maintenance medication was reviewed in detail including emphasizing most importantly the difference between maintenance and prns and under what circumstances the prns are to be triggered using an action plan format where appropriate.  Total time for H and P, chart review, counseling, reviewing hfa/neb device(s) , directly observing portions of ambulatory 02 saturation study/ and generating customized AVS unique to this acute  office visit / same day charting > 40 min with pt new to me                    Christinia Gully, MD 02/25/2021

## 2021-02-28 ENCOUNTER — Other Ambulatory Visit: Payer: Self-pay

## 2021-02-28 ENCOUNTER — Telehealth: Payer: Self-pay | Admitting: Pulmonary Disease

## 2021-02-28 MED ORDER — PREDNISONE 10 MG PO TABS
ORAL_TABLET | ORAL | 0 refills | Status: AC
Start: 2021-02-28 — End: 2021-03-06

## 2021-02-28 NOTE — Telephone Encounter (Signed)
Called and spoke to patient and she voiced understanding of how to take the prednisone taper. Patient also stated while she was on the phone that her inhalers have been irritating her inhalers have been irritating her throat after she uses them for a few minutes and then irritation goes away. Wanted Dr. Halford Chessman to be aware.

## 2021-02-28 NOTE — Telephone Encounter (Signed)
Please have her try using a spacer device with symbicort and albuterol

## 2021-02-28 NOTE — Telephone Encounter (Signed)
Please send script for prednisone 10 mg pill >> 3 pills daily for 2 days, 2 pills daily for 2 days, 1 pill daily for 2 days

## 2021-02-28 NOTE — Telephone Encounter (Signed)
Primary Pulmonologist: Dr. Halford Chessman   Last office visit and with whom: Dr. Melvyn Novas acute visit on 02/25/2021 What do we see them for (pulmonary problems): copd Last OV assessment/plan: see below  Was appointment offered to patient (explain)?  No patient seen on 02-25-2021   Reason for call: Patient had sick call on 02/24/2021 stating she was SOB and coughing up clear mucus with yellow tint. No fevers and negative flu and covid tests. Patient was given prednisone during OV on 02-25-2021 and would like to know if she can have more prednisone since it helped but she is still coughing up mucus.   Dr. Halford Chessman please advise   Allergies  Allergen Reactions   Meloxicam Other (See Comments)    BAD DREAMS, SAD THOUGHTS    Immunization History  Administered Date(s) Administered   Fluad Quad(high Dose 65+) 10/08/2018, 10/16/2019, 11/18/2020   Hepatitis B 12/06/1990, 01/03/1991, 07/07/1991   Influenza Split 10/17/2013   Influenza Whole 10/23/2008   Influenza, High Dose Seasonal PF 09/26/2016, 10/12/2017   Influenza,inj,Quad PF,6+ Mos 10/29/2012, 10/28/2015   Influenza,inj,quad, With Preservative 10/30/2018   Influenza-Unspecified 10/05/2014   PFIZER(Purple Top)SARS-COV-2 Vaccination 06/09/2019, 06/30/2019, 02/06/2020   Pfizer Covid-19 Vaccine Bivalent Booster 3yrs & up 12/04/2020   Pneumococcal Conjugate-13 03/23/2013   Pneumococcal Polysaccharide-23 12/21/2005, 07/25/2010   Td 03/13/1996   Tdap 08/12/2010   Zoster, Live 07/12/2010     Assessment & Plan Note by Tanda Rockers, MD at 02/25/2021 10:10 AM  Author: Tanda Rockers, MD Author Type: Physician Filed: 02/25/2021 11:35 AM  Note Status: [place] List: Cosign Not Required Encounter Date: 02/25/2021  Problem: COPD with acute exacerbation [facility])  Editor: Tanda Rockers, MD (Physician)      Prior Versions: 1. Tanda Rockers, MD (Physician) at 02/25/2021 11:34 AM - Alison Stalling   2. Tanda Rockers, MD (Physician) at 02/25/2021 10:15 AM - Written  Quit smoking  2007 with criteria for GOLD 2/3 with atypical exp f/v loop 03/01/17  - 02/25/2021  After extensive coaching inhaler device,  effectiveness =  75% from a baseline of 10% at most (very short Ti)     Having frequent flares of copd  - DDX of  difficult airways management almost all start with A and  include Adherence, Ace Inhibitors, Acid Reflux, Active Sinus Diseas bunch of PE'se, Alpha 1 Antitripsin deficiency, Anxiety masquerading as Airways dz,  ABPA,  Allergy(esp in young), Aspiration (esp in elderly), Adverse effects of meds,  Active smoking or vaping, A bunch of PE's (a small clot burden can't cause this syndrome unless there is already severe underlying pulm or vascular dz with poor reserve) plus two Bs  = Bronchiectasis and Beta blocker use..and one C= CHF   Adherence is always the initial "prime suspect" and is a multilayered concern that requires a "trust but verify" approach in every patient - starting with knowing how to use medications, especially inhalers, correctly, keeping up with refills and understanding the fundamental difference between maintenance and prns vs those medications only taken for a very short course and then stopped and not refilled.  - did med reconciliation today - see hfa training    ? Allergy/asthma component > Prednisone 10 mg take  4 each am x 2 days,   2 each am x 2 days,  1 each am x 2 days and stop and try to bet more of the ICS in symbicort on target by better hfa technique   ? Acid (or non-acid) GERD > always difficult to  exclude as up to 75% of pts in some series report no assoc GI/ Heartburn symptoms> if not improving p above rec trial of ppi /h2hs    ? A bunch of PEs > onset of symptoms well before d/c DOAC with neg venous dopplers Feb 21 2021 so unlikely    ? chf > ht size ok on cxr    Each maintenance medication was reviewed in detail including emphasizing most importantly the difference between maintenance and prns and under what circumstances the prns are  to be triggered using an action plan format where appropriate.   Total time for H and P, chart review, counseling, reviewing hfa/neb device(s) , directly observing portions of ambulatory 02 saturation study/ and generating customized AVS unique to this acute  office visit / same day charting > 40 min with pt new to me                       Patient Instructions by Tanda Rockers, MD at 02/25/2021 9:00 AM  Author: Tanda Rockers, MD Author Type: Physician Filed: 02/25/2021  9:33 AM  Note Status: Addendum Cosign: Cosign Not Required Encounter Date: 02/25/2021  Editor: Tanda Rockers, MD (Physician)      Prior Versions: 1. Tanda Rockers, MD (Physician) at 02/25/2021  9:30 AM - Addendum   2. Tanda Rockers, MD (Physician) at 02/25/2021  9:29 AM - Signed   Work on inhaler technique:  relax and gently blow all the way out then take a nice smooth full deep breath back in, triggering the inhaler at same time you start breathing in.  Hold for up to 5 seconds if you can. Blow symbicort out thru nose. Rinse and gargle with water when done.  If mouth or throat bother you at all,  try brushing teeth/gums/tongue with arm and hammer toothpaste/ make a slurry and gargle and spit out.    Prednisone 10 mg take  4 each am x 2 days,   2 each am x 2 days,  1 each am x 2 days and stop    Ok to use the albuterol up to every 4 hours if can't catch your breath   Call if not improving for f/u 1st of next week Late add may need to try empirical gerd rx if not responding to pred rx with better device use

## 2021-02-28 NOTE — Telephone Encounter (Signed)
Called and spoke to patient. She voiced understanding about spacer device. Offered to have patient come by and pick one up from [facility] office and show her how to use it. She states she will come by tomorrow afternoon

## 2021-03-04 ENCOUNTER — Other Ambulatory Visit: Payer: Self-pay

## 2021-03-04 ENCOUNTER — Telehealth: Payer: Self-pay | Admitting: Pulmonary Disease

## 2021-03-04 MED ORDER — PREDNISONE 10 MG PO TABS: ORAL_TABLET | ORAL | 0 refills | Status: AC

## 2021-03-04 MED ORDER — CEFUROXIME AXETIL 500 MG PO TABS: 500.0000 mg | ORAL_TABLET | Freq: Two times a day (BID) | ORAL | 0 refills | Status: DC

## 2021-03-04 NOTE — Telephone Encounter (Signed)
Called and spoke to patient and gave her Dr. Collie Siad recs. She voiced understanding on how to take medication and also to go to the ER if symptoms worsen. Nothing further needed.

## 2021-03-04 NOTE — Telephone Encounter (Signed)
Patient called to notify office that she is short of breath more than usual today. She states she has checked her O2 sats and they are in the upper 90s. She has not had to use oxygen but is using rescue inhaler and regular inhaler today but is still feeling some SOB.   Patient was seen in office by Dr. Melvyn Novas last week and was in office this week for education on how to use a spacer device. Asked patient if she is using spacer and she confirmed she is using and the inhaler is placed tightly in seal of spacer device.   Patient wants to know if Dr. Halford Chessman has any recommendations for her.   Will route to Dr. Halford Chessman [facility] since patient is SOB.

## 2021-03-04 NOTE — Telephone Encounter (Signed)
Can send extend course of prednisone and antibiotic.  Please send script prednisone 10 mg pill >> 2 pills daily for 2 days, 1 pill daily for 2 days.  Please send script for ceftin 500 mg bid for 7 days.  She should go to the ER over the weekend if her symptoms gets worse.

## 2021-03-09 ENCOUNTER — Other Ambulatory Visit: Payer: Self-pay | Admitting: Family Medicine

## 2021-03-10 ENCOUNTER — Other Ambulatory Visit: Payer: Self-pay

## 2021-03-14 ENCOUNTER — Telehealth: Payer: Self-pay | Admitting: Internal Medicine

## 2021-03-14 ENCOUNTER — Other Ambulatory Visit: Payer: Self-pay

## 2021-03-14 MED ORDER — BUDESONIDE-FORMOTEROL FUMARATE 160-4.5 MCG/ACT IN AERO: 2.0000 | INHALATION_SPRAY | Freq: Two times a day (BID) | RESPIRATORY_TRACT | 5 refills | Status: DC

## 2021-03-14 NOTE — Telephone Encounter (Signed)
Called and spoke to patient. She states she is confused because she has a prescription for prednisone that she filled on 03-05-21 She states it is 6 pills and directions read to take  10 mg tabs 2 times a day for 2 days and then 1 tablet once a day for 2 days. Advised patient that those were the directions from Dr. Halford Chessman that he wanted her to do after she had a sick call on 03/04/21. She voiced understanding and states she will go ahead and take the prednisone since she still has some SOB. Nothing further needed.

## 2021-03-15 ENCOUNTER — Encounter: Payer: Self-pay | Admitting: Family Medicine

## 2021-03-15 ENCOUNTER — Ambulatory Visit (INDEPENDENT_AMBULATORY_CARE_PROVIDER_SITE_OTHER): Payer: Medicare HMO | Admitting: Family Medicine

## 2021-03-15 ENCOUNTER — Other Ambulatory Visit: Payer: Self-pay

## 2021-03-15 VITALS — BP 132/82 | HR 80 | Temp 97.9°F | Resp 18 | Ht 65.5 in | Wt 167.0 lb

## 2021-03-15 DIAGNOSIS — M25562 Pain in left knee: Secondary | ICD-10-CM

## 2021-03-15 NOTE — Progress Notes (Signed)
Subjective:    Patient ID: Mary Blevins, female    DOB: [birth year], 76 y.o.   MRN: [id number]  Patient states that over the last 3 to 4 days, she has had increasing pain in her left knee.  The pain is located over the medial and lateral joint lines.  She denies any falls.  She denies any injuries.  Is been a gradual onset however now the pain is severe.  She has been trying to wear the knee brace with minimal relief.  She has not been taking any NSAIDs.  Today on exam there is no erythema.  There is trace effusion.  She is tender to palpation of the medial greater than lateral joint line.  She has pain with varus and valgus stress.  She also reports pain with Apley grind.  She reports pain with anterior posterior drawer signs although all the test showed no instability.  Past Medical History:  Diagnosis Date   Arthritis    COPD (chronic obstructive pulmonary disease) (HCC)    Dyspnea    Elevated lipids    Generalized headaches    History of pneumonia    Hyperlipidemia    Hypertension    Hypothyroidism    MVP (mitral valve prolapse)    palpitations   Osteopenia    Seasonal allergies    Sinusitis    Tubular adenoma of colon    Past Surgical History:  Procedure Laterality Date   CATARACT EXTRACTION Left    COLONOSCOPY WITH PROPOFOL N/A 01/06/2016   Procedure: COLONOSCOPY WITH PROPOFOL;  Surgeon: Doran Stabler, MD;  Location: [facility];  Service: Gastroenterology;  Laterality: N/A;   DILATION AND CURETTAGE OF UTERUS     THYROIDECTOMY     TONSILLECTOMY     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   Procedure: RIGHT TOTAL HIP ARTHROPLASTY ANTERIOR APPROACH;  Surgeon: Mcarthur Rossetti, MD;  Location: [facility];  Service: Orthopedics;  Laterality: Right;  Needs RNFA   Current Outpatient Medications on File Prior to Visit  Medication Sig Dispense Refill   albuterol (PROVENTIL) (2.5 MG/3ML) 0.083% nebulizer solution Take 3 mLs (2.5 mg total) by nebulization every 6 (six)  hours as needed for wheezing or shortness of breath. (Patient taking differently: Take 2.5 mg by nebulization daily.) 360 mL 3   budesonide-formoterol (SYMBICORT) 160-4.5 MCG/ACT inhaler Inhale 2 puffs into the lungs 2 (two) times daily. 1 each 5   DULoxetine (CYMBALTA) 60 MG capsule      fluticasone (FLONASE) 50 MCG/ACT nasal spray SPRAY 2 SPRAYS INTO EACH NOSTRIL EVERY DAY 16 g 2   gabapentin (NEURONTIN) 300 MG capsule Take 300 mg by mouth daily.     HYDROcodone-acetaminophen (NORCO) 5-325 MG tablet Take 1 tablet by mouth every 6 (six) hours as needed for moderate pain. 30 tablet 0   hydrOXYzine (ATARAX/VISTARIL) 25 MG tablet Take 1 tablet (25 mg total) by mouth 3 (three) times daily as needed for itching. 30 tablet 6   levothyroxine (SYNTHROID) 50 MCG tablet TAKE 1 TABLET BY MOUTH EVERY DAY BEFORE BREAKFAST 90 tablet 3   predniSONE (DELTASONE) 10 MG tablet Take 10 mg by mouth daily with breakfast.     Propylene Glycol-Glycerin 0.6-0.6 % SOLN Place 1 drop into both eyes 2 (two) times daily. Artifical tears     rosuvastatin (CRESTOR) 5 MG tablet Take 1 tablet (5 mg total) by mouth daily. 90 tablet 3   sodium chloride (OCEAN) 0.65 % SOLN nasal spray Place 1 spray into both  nostrils daily.     Tiotropium Bromide Monohydrate (SPIRIVA RESPIMAT) 2.5 MCG/ACT AERS Inhale 2 puffs into the lungs daily. 4 g 0   tiZANidine (ZANAFLEX) 4 MG tablet TAKE 1 TABLET BY MOUTH EVERY 6 HOURS AS NEEDED FOR MUSCLE SPASMS. 30 tablet 0   verapamil (CALAN-SR) 120 MG CR tablet TAKE 1 TABLET EVERY DAY 90 tablet 3   No current facility-administered medications on file prior to visit.   Allergies  Allergen Reactions   Meloxicam Other (See Comments)    BAD DREAMS, SAD THOUGHTS   Social History   Socioeconomic History   Marital status: Widowed    Spouse name: Not on file   Number of children: 0   Years of education: 42 B   Highest education level: Not on file  Occupational History    Employer: [employer]  Tobacco Use   Smoking status: Former    Types: Cigarettes    Quit date: 07/24/2005    Years since quitting: 15.6   Smokeless tobacco: Never  Vaping Use   Vaping Use: Never used  Substance and Sexual Activity   Alcohol use: Yes    Comment: rare   Drug use: No   Sexual activity: Not on file  Other Topics Concern   Not on file  Social History Narrative   Lives alone   Social Determinants of Health   Financial Resource Strain: Low Risk    Difficulty of Paying Living Expenses: Not hard at all  Food Insecurity: No Food Insecurity   Worried About Charity fundraiser in the Last Year: Never true   [place] in the Last Year: Never true  Transportation Needs: No Transportation Needs   Lack of Transportation (Medical): No   Lack of Transportation (Non-Medical): No  Physical Activity: Inactive   Days of Exercise per Week: 0 days   Minutes of Exercise per Session: 0 min  Stress: No Stress Concern Present   Feeling of Stress : Only a little  Social Connections: Moderately Isolated   Frequency of Communication with Friends and Family: More than three times a week   Frequency of Social Gatherings with Friends and Family: More than three times a week   Attends Religious Services: More than 4 times per year   Active Member of [employer] or Organizations: No   Attends Archivist Meetings: Never   Marital Status: Widowed  Human resources officer Violence: Not At Risk   Fear of Current or Ex-Partner: No   Emotionally Abused: No   Physically Abused: No   Sexually Abused: No    Past Medical History:  Diagnosis Date   Arthritis    COPD (chronic obstructive pulmonary disease) (HCC)    Dyspnea    Elevated lipids    Generalized headaches    History of pneumonia    Hyperlipidemia    Hypertension    Hypothyroidism    MVP (mitral valve prolapse)    palpitations   Osteopenia    Seasonal allergies    Sinusitis    Tubular adenoma of colon    Past Surgical History:   Procedure Laterality Date   CATARACT EXTRACTION Left    COLONOSCOPY WITH PROPOFOL N/A 01/06/2016   Procedure: COLONOSCOPY WITH PROPOFOL;  Surgeon: Doran Stabler, MD;  Location: [facility];  Service: Gastroenterology;  Laterality: N/A;   DILATION AND CURETTAGE OF UTERUS     THYROIDECTOMY     TONSILLECTOMY     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  Procedure: RIGHT TOTAL HIP ARTHROPLASTY ANTERIOR APPROACH;  Surgeon: Mcarthur Rossetti, MD;  Location: [facility];  Service: Orthopedics;  Laterality: Right;  Needs RNFA   Current Outpatient Medications on File Prior to Visit  Medication Sig Dispense Refill   albuterol (PROVENTIL) (2.5 MG/3ML) 0.083% nebulizer solution Take 3 mLs (2.5 mg total) by nebulization every 6 (six) hours as needed for wheezing or shortness of breath. (Patient taking differently: Take 2.5 mg by nebulization daily.) 360 mL 3   budesonide-formoterol (SYMBICORT) 160-4.5 MCG/ACT inhaler Inhale 2 puffs into the lungs 2 (two) times daily. 1 each 5   DULoxetine (CYMBALTA) 60 MG capsule      fluticasone (FLONASE) 50 MCG/ACT nasal spray SPRAY 2 SPRAYS INTO EACH NOSTRIL EVERY DAY 16 g 2   gabapentin (NEURONTIN) 300 MG capsule Take 300 mg by mouth daily.     HYDROcodone-acetaminophen (NORCO) 5-325 MG tablet Take 1 tablet by mouth every 6 (six) hours as needed for moderate pain. 30 tablet 0   hydrOXYzine (ATARAX/VISTARIL) 25 MG tablet Take 1 tablet (25 mg total) by mouth 3 (three) times daily as needed for itching. 30 tablet 6   levothyroxine (SYNTHROID) 50 MCG tablet TAKE 1 TABLET BY MOUTH EVERY DAY BEFORE BREAKFAST 90 tablet 3   predniSONE (DELTASONE) 10 MG tablet Take 10 mg by mouth daily with breakfast.     Propylene Glycol-Glycerin 0.6-0.6 % SOLN Place 1 drop into both eyes 2 (two) times daily. Artifical tears     rosuvastatin (CRESTOR) 5 MG tablet Take 1 tablet (5 mg total) by mouth daily. 90 tablet 3   sodium chloride (OCEAN) 0.65 % SOLN nasal spray Place 1 spray into  both nostrils daily.     Tiotropium Bromide Monohydrate (SPIRIVA RESPIMAT) 2.5 MCG/ACT AERS Inhale 2 puffs into the lungs daily. 4 g 0   tiZANidine (ZANAFLEX) 4 MG tablet TAKE 1 TABLET BY MOUTH EVERY 6 HOURS AS NEEDED FOR MUSCLE SPASMS. 30 tablet 0   verapamil (CALAN-SR) 120 MG CR tablet TAKE 1 TABLET EVERY DAY 90 tablet 3   No current facility-administered medications on file prior to visit.   Allergies  Allergen Reactions   Meloxicam Other (See Comments)    BAD DREAMS, SAD THOUGHTS   Social History   Socioeconomic History   Marital status: Widowed    Spouse name: Not on file   Number of children: 0   Years of education: 63 B   Highest education level: Not on file  Occupational History    Employer: [employer]  Tobacco Use   Smoking status: Former    Types: Cigarettes    Quit date: 07/24/2005    Years since quitting: 15.6   Smokeless tobacco: Never  Vaping Use   Vaping Use: Never used  Substance and Sexual Activity   Alcohol use: Yes    Comment: rare   Drug use: No   Sexual activity: Not on file  Other Topics Concern   Not on file  Social History Narrative   Lives alone   Social Determinants of Health   Financial Resource Strain: Low Risk    Difficulty of Paying Living Expenses: Not hard at all  Food Insecurity: No Food Insecurity   Worried About Charity fundraiser in the Last Year: Never true   [place] in the Last Year: Never true  Transportation Needs: No Transportation Needs   Lack of Transportation (Medical): No   Lack of Transportation (Non-Medical): No  Physical Activity: Inactive  Days of Exercise per Week: 0 days   Minutes of Exercise per Session: 0 min  Stress: No Stress Concern Present   Feeling of Stress : Only a little  Social Connections: Moderately Isolated   Frequency of Communication with Friends and Family: More than three times a week   Frequency of Social Gatherings with Friends and Family: More than three times a  week   Attends Religious Services: More than 4 times per year   Active Member of [employer] or Organizations: No   Attends Archivist Meetings: Never   Marital Status: Widowed  Human resources officer Violence: Not At Risk   Fear of Current or Ex-Partner: No   Emotionally Abused: No   Physically Abused: No   Sexually Abused: No      Review of Systems  All other systems reviewed and are negative.     Objective:   Physical Exam Vitals reviewed.  Constitutional:      General: She is not in acute distress.    Appearance: She is well-developed. She is not diaphoretic.  Neck:     Vascular: No JVD.  Cardiovascular:     Rate and Rhythm: Normal rate and regular rhythm.     Heart sounds: Normal heart sounds.  Pulmonary:     Effort: Pulmonary effort is normal. No respiratory distress.     Breath sounds: Normal breath sounds. No stridor. No wheezing or rales.  Chest:     Chest wall: No tenderness.  Abdominal:     General: Bowel sounds are normal.     Palpations: Abdomen is soft.  Musculoskeletal:     Cervical back: Neck supple.     Left knee: Crepitus present. Decreased range of motion. Tenderness present over the medial joint line and lateral joint line. No LCL laxity, MCL laxity, ACL laxity or PCL laxity.         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  I recommended trying NSAIDs and the knee brace however the patient request a cortisone injection.  Using sterile technique, I injected the left knee with 2 cc of lidocaine, 2 cc of Marcaine, and 2 cc of 40 mg/mL Kenalog.  Recheck if no better in 1 week or sooner if worsening

## 2021-03-17 ENCOUNTER — Ambulatory Visit (INDEPENDENT_AMBULATORY_CARE_PROVIDER_SITE_OTHER): Payer: Medicare HMO | Admitting: Diagnostic Neuroimaging

## 2021-03-17 ENCOUNTER — Encounter: Payer: Medicare HMO | Admitting: Diagnostic Neuroimaging

## 2021-03-17 DIAGNOSIS — R200 Anesthesia of skin: Secondary | ICD-10-CM

## 2021-03-17 DIAGNOSIS — Z0289 Encounter for other administrative examinations: Secondary | ICD-10-CM

## 2021-03-17 DIAGNOSIS — Z79899 Other long term (current) drug therapy: Secondary | ICD-10-CM | POA: Diagnosis not present

## 2021-03-17 DIAGNOSIS — M545 Low back pain, unspecified: Secondary | ICD-10-CM | POA: Diagnosis not present

## 2021-03-17 DIAGNOSIS — R202 Paresthesia of skin: Secondary | ICD-10-CM | POA: Diagnosis not present

## 2021-03-17 DIAGNOSIS — R030 Elevated blood-pressure reading, without diagnosis of hypertension: Secondary | ICD-10-CM | POA: Diagnosis not present

## 2021-03-17 NOTE — Procedures (Signed)
[facility]  NCS (NERVE CONDUCTION STUDY) WITH EMG (ELECTROMYOGRAPHY) REPORT   STUDY DATE: 03/17/21 PATIENT NAME: Mary Blevins DOB: 1945/03/25 MRN: [id number]  ORDERING CLINICIAN: Andrey Spearman, MD   TECHNOLOGIST: Sherre Scarlet ELECTROMYOGRAPHER: Earlean Polka. Vanessa Kampf, MD  CLINICAL INFORMATION: 76 year old female with right hand numbness (digits 4 and 5).  FINDINGS: NERVE CONDUCTION STUDY:  Right ulnar motor responses normal distal latency, normal amplitude and slow conduction velocity above the elbow (32 m/s) and normal conduction velocity below the elbow (59 m/s).  Right median and left ulnar motor responses are normal.  Bilateral median and ulnar sensory responses are normal.  Right median to ulnar transcarpal comparison study is normal.  Bilateral ulnar F wave latencies are normal.   NEEDLE ELECTROMYOGRAPHY:  Needle examination of right upper extremity and right cervical paraspinal muscles notable for decreased recruitment of large motor units in the right flexor carpi ulnaris and right first dorsal interosseous.  Mild abnormal spontaneous activity in the right first dorsal interosseous.   IMPRESSION:   Abnormal study demonstrating: -Slowing of right ulnar motor response across the elbow, with normal amplitude and distal latency.  Chronic denervation in right flexor carpi ulnaris and right first dorsal interosseous.  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 -Normal right ulnar sensory response raises possibility of right C8-T1 radiculopathy, but not otherwise supported on this study.  Consider neuroimaging of cervical spine for further evaluation if clinically indicated.   INTERPRETING PHYSICIAN:  Penni Bombard, MD Certified in Neurology, Neurophysiology and Neuroimaging  [facility] [address], [place], [place] [address] [phone]  [facility]    Nerve / Sites Muscle Latency Ref.  Amplitude Ref. Rel Amp Segments Distance Velocity Ref. Area    ms ms mV mV %  cm m/s m/s mVms  R Median - APB     Wrist APB 3.7 ?4.4 4.5 ?4.0 100 Wrist - APB 7   17.9     Upper arm APB 7.8  2.5  54.4 Upper arm - Wrist 21 51 ?49 11.8  R Ulnar - ADM     Wrist ADM 2.7 ?3.3 7.4 ?6.0 100 Wrist - ADM 7   25.2     B.Elbow ADM 5.7  6.3  85.6 B.Elbow - Wrist 18 59 ?49 22.6     A.Elbow ADM 8.8  5.8  91.8 A.Elbow - B.Elbow 10 32 ?49 23.7  L Ulnar - ADM     Wrist ADM 2.4 ?3.3 8.4 ?6.0 100 Wrist - ADM 7   29.1     B.Elbow ADM 6.0  6.3  75.6 B.Elbow - Wrist 18 50 ?49 23.1     A.Elbow ADM 8.0  6.1  96.6 A.Elbow - B.Elbow 10 50 ?49 24.3           SNC    Nerve / Sites Rec. Site Peak Lat Ref.  Amp Ref. Segments Distance Peak Diff Ref.    ms ms V V  cm ms ms  R Median, Ulnar - Transcarpal comparison     Median Palm Wrist 2.3 ?2.2 72 ?35 Median Palm - Wrist 8       Ulnar Palm Wrist 2.1 ?2.2 9 ?12 Ulnar Palm - Wrist 8          Median Palm - Ulnar Palm  0.2 ?0.4  R Median - Orthodromic (Dig II, Mid palm)     Dig II Wrist 3.2 ?3.4 19 ?10 Dig II - Wrist 13  L Median - Orthodromic (Dig II, Mid palm)     Dig II Wrist 3.2 ?3.4 12 ?10 Dig II - Wrist 13    R Ulnar - Orthodromic, (Dig V, Mid palm)     Dig V Wrist 2.7 ?3.1 5 ?5 Dig V - Wrist 11    L Ulnar - Orthodromic, (Dig V, Mid palm)     Dig V Wrist 3.0 ?3.1 6 ?5 Dig V - Wrist 39                 F  Wave    Nerve F Lat Ref.   ms ms  R Ulnar - ADM 31.5 ?32.0  L Ulnar - ADM 29.9 ?32.0         EMG Summary Table    Spontaneous MUAP Recruitment  Muscle IA Fib PSW Fasc Other Amp Dur. Poly Pattern  R. Deltoid Normal None None None _______ Normal Normal Normal Normal  R. Biceps brachii Normal None None None _______ Normal Normal Normal Normal  R. Triceps brachii Normal None None None _______ Normal Normal Normal Normal  R. Flexor carpi radialis Normal None None None _______ Normal Normal Normal Normal  R. Flexor carpi ulnaris Normal None 1+ None _______  Increased Normal Normal Reduced  R. First dorsal interosseous Normal None None None _______ Increased Normal 2+ Reduced  R. Cervical paraspinals Normal None None None _______ Normal Normal Normal Normal  R. Extensor indicis proprius Normal None None None _______ Normal Normal Normal Normal

## 2021-03-18 ENCOUNTER — Emergency Department (HOSPITAL_COMMUNITY)
Admission: EM | Admit: 2021-03-18 | Discharge: 2021-03-18 | Disposition: A | Discharge status: Home / Self Care | Payer: Medicare HMO | Attending: Emergency Medicine | Admitting: Emergency Medicine

## 2021-03-18 ENCOUNTER — Telehealth: Payer: Self-pay | Admitting: Family Medicine

## 2021-03-18 ENCOUNTER — Emergency Department (HOSPITAL_COMMUNITY): Payer: Medicare HMO

## 2021-03-18 ENCOUNTER — Encounter (HOSPITAL_COMMUNITY): Payer: Self-pay | Admitting: Emergency Medicine

## 2021-03-18 ENCOUNTER — Other Ambulatory Visit: Payer: Self-pay

## 2021-03-18 DIAGNOSIS — Z7952 Long term (current) use of systemic steroids: Secondary | ICD-10-CM | POA: Diagnosis not present

## 2021-03-18 DIAGNOSIS — Z7951 Long term (current) use of inhaled steroids: Secondary | ICD-10-CM | POA: Diagnosis not present

## 2021-03-18 DIAGNOSIS — J441 Chronic obstructive pulmonary disease with (acute) exacerbation: Secondary | ICD-10-CM | POA: Diagnosis not present

## 2021-03-18 DIAGNOSIS — J449 Chronic obstructive pulmonary disease, unspecified: Secondary | ICD-10-CM | POA: Diagnosis not present

## 2021-03-18 DIAGNOSIS — R059 Cough, unspecified: Secondary | ICD-10-CM | POA: Diagnosis not present

## 2021-03-18 MED ORDER — ALBUTEROL SULFATE (2.5 MG/3ML) 0.083% IN NEBU
5.0000 mg | INHALATION_SOLUTION | Freq: Once | RESPIRATORY_TRACT | Status: AC
Start: 2021-03-18 — End: 2021-03-18
  Administered 2021-03-18: 5 mg via RESPIRATORY_TRACT
  Filled 2021-03-18: qty 6

## 2021-03-18 NOTE — ED Notes (Signed)
Pt took home prednisone

## 2021-03-18 NOTE — Discharge Instructions (Signed)
Your COPD appears stable today.  Continue taking your currently prescribed medications.  Additionally, if needed you can use Robitussin-DM for cough.  Also, take Claritin, 10 mg each day to help for possible allergy symptoms.  See your doctor as needed for problems.

## 2021-03-18 NOTE — ED Provider Notes (Signed)
[facility] EMERGENCY DEPARTMENT Provider Note   CSN: [id number] Arrival date & time: 03/18/21  0750     History  Chief Complaint  Patient presents with   Cough    Mary Blevins is a 76 y.o. female.  HPI She presents for ongoing congestion and cough and wonders if the pollen is causing it.  She has been treated several times this month for the same problem.  She has received both prednisone and antibiotics for it.  She is also taking ongoing inhalers both long and short acting.  She has no other distinct complaint    Home Medications Prior to Admission medications   Medication Sig Start Date End Date Taking? Authorizing Provider  albuterol (PROVENTIL) (2.5 MG/3ML) 0.083% nebulizer solution Take 3 mLs (2.5 mg total) by nebulization every 6 (six) hours as needed for wheezing or shortness of breath. Patient taking differently: Take 2.5 mg by nebulization daily. 06/03/20   [name], MD  budesonide-formoterol (SYMBICORT) 160-4.5 MCG/ACT inhaler Inhale 2 puffs into the lungs 2 (two) times daily. 03/14/21   [name], MD  DULoxetine (CYMBALTA) 60 MG capsule  11/09/20   [provider]  fluticasone (FLONASE) 50 MCG/ACT nasal spray SPRAY 2 SPRAYS INTO EACH NOSTRIL EVERY DAY 07/11/18   [name], MD  gabapentin (NEURONTIN) 300 MG capsule Take 300 mg by mouth daily. 03/09/21   [provider]  HYDROcodone-acetaminophen (NORCO) 5-325 MG tablet Take 1 tablet by mouth every 6 (six) hours as needed for moderate pain. 12/15/20   Pete Pelt, PA-C  hydrOXYzine (ATARAX/VISTARIL) 25 MG tablet Take 1 tablet (25 mg total) by mouth 3 (three) times daily as needed for itching. 10/13/20   [name], MD  levothyroxine (SYNTHROID) 50 MCG tablet TAKE 1 TABLET BY MOUTH EVERY DAY BEFORE BREAKFAST 01/18/21   [name], MD  predniSONE (DELTASONE) 10 MG tablet Take 10 mg by mouth daily with breakfast.    [provider]  Propylene  Glycol-Glycerin 0.6-0.6 % SOLN Place 1 drop into both eyes 2 (two) times daily. Artifical tears    [provider]  rosuvastatin (CRESTOR) 5 MG tablet Take 1 tablet (5 mg total) by mouth daily. 09/17/20   Belva Crome, MD  sodium chloride (OCEAN) 0.65 % SOLN nasal spray Place 1 spray into both nostrils daily.    [provider]  Tiotropium Bromide Monohydrate (SPIRIVA RESPIMAT) 2.5 MCG/ACT AERS Inhale 2 puffs into the lungs daily. 02/25/21   Tanda Rockers, MD  tiZANidine (ZANAFLEX) 4 MG tablet TAKE 1 TABLET BY MOUTH EVERY 6 HOURS AS NEEDED FOR MUSCLE SPASMS. 03/10/21   [name], MD  verapamil (CALAN-SR) 120 MG CR tablet TAKE 1 TABLET EVERY DAY 11/23/20   [name], MD      Allergies    Oxycodone and Meloxicam    Review of Systems   Review of Systems  Physical Exam Updated Vital Signs BP 136/83    Pulse 73    Temp 98.7 F (37.1 C) (Oral)    Resp 17    Ht 5\' 5"  (1.651 m)    Wt 75.8 kg    SpO2 100%    BMI 27.79 kg/m  Physical Exam Vitals and nursing note reviewed.  Constitutional:      General: She is not in acute distress.    Appearance: She is well-developed. She is not ill-appearing or toxic-appearing.  HENT:     Head: Normocephalic and atraumatic.  Right Ear: External ear normal.     Left Ear: External ear normal.  Eyes:     Conjunctiva/sclera: Conjunctivae normal.     Pupils: Pupils are equal, round, and reactive to light.  Neck:     Trachea: Phonation normal.  Cardiovascular:     Rate and Rhythm: Normal rate and regular rhythm.     Heart sounds: Normal heart sounds.  Pulmonary:     Effort: Pulmonary effort is normal. No respiratory distress.     Breath sounds: No stridor.     Comments: Few scattered rhonchi.  No wheezes.  No increased work of breathing. Abdominal:     Palpations: Abdomen is soft.     Tenderness: There is no abdominal tenderness.  Musculoskeletal:        General: Normal range of motion.     Cervical back: Normal  range of motion and neck supple.  Skin:    General: Skin is warm and dry.  Neurological:     Mental Status: She is alert and oriented to person, place, and time.     Cranial Nerves: No cranial nerve deficit.     Sensory: No sensory deficit.     Motor: No abnormal muscle tone.     Coordination: Coordination normal.  Psychiatric:        Mood and Affect: Mood normal.        Behavior: Behavior normal.        Thought Content: Thought content normal.        Judgment: Judgment normal.    ED Results / Procedures / Treatments   Labs (all labs ordered are listed, but only abnormal results are displayed) Labs Reviewed - No data to display  EKG None  Radiology DG Chest 2 View  Result Date: 03/18/2021 CLINICAL DATA:  Cough EXAM: CHEST - 2 VIEW COMPARISON:  Chest radiograph dated January 18, 2021 FINDINGS: The heart size and mediastinal contours are within normal limits. Hyperinflated lungs without focal consolidation or pleural effusion. The visualized skeletal structures are unremarkable. IMPRESSION: No active cardiopulmonary disease. Electronically Signed   By: Keane Police D.O.   On: 03/18/2021 08:52   NCV with EMG(electromyography)  Result Date: 03/17/2021 Penni Bombard, MD     03/17/2021  4:34 PM  [facility] NCS (NERVE CONDUCTION STUDY) WITH EMG (ELECTROMYOGRAPHY) REPORT STUDY DATE: 03/17/21 PATIENT NAME: Mary Blevins DOB: 1945/12/17 MRN: [id number] ORDERING CLINICIAN: Andrey Spearman, MD TECHNOLOGIST: Sherre Scarlet ELECTROMYOGRAPHER: Earlean Polka. Penumalli, MD CLINICAL INFORMATION: 76 year old female with right hand numbness (digits 4 and 5). FINDINGS: NERVE CONDUCTION STUDY: Right ulnar motor responses normal distal latency, normal amplitude and slow conduction velocity above the elbow (32 m/s) and normal conduction velocity below the elbow (59 m/s). Right median and left ulnar motor responses are normal. Bilateral median and ulnar sensory responses are normal. Right  median to ulnar transcarpal comparison study is normal. Bilateral ulnar F wave latencies are normal. NEEDLE ELECTROMYOGRAPHY: Needle examination of right upper extremity and right cervical paraspinal muscles notable for decreased recruitment of large motor units in the right flexor carpi ulnaris and right first dorsal interosseous.  Mild abnormal spontaneous activity in the right first dorsal interosseous. IMPRESSION: Abnormal study demonstrating: -Slowing of right ulnar motor response across the elbow, with normal amplitude and distal latency.  Chronic denervation in right flexor carpi ulnaris and right first dorsal interosseous.  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 -Normal right ulnar sensory response raises possibility of right C8-T1  radiculopathy, but not otherwise supported on this study.  Consider neuroimaging of cervical spine for further evaluation if clinically indicated. INTERPRETING PHYSICIAN: Penni Bombard, MD Certified in Neurology, Neurophysiology and Neuroimaging [facility] [address], [place], [place] [address] [phone] [facility]   Nerve / Sites Muscle Latency Ref. Amplitude Ref. Rel Amp Segments Distance Velocity Ref. Area   ms ms mV mV %  cm m/s m/s mVms R Median - APB    Wrist APB 3.7 ?4.4 4.5 ?4.0 100 Wrist - APB 7   17.9    Upper arm APB 7.8  2.5  54.4 Upper arm - Wrist 21 51 ?49 11.8 R Ulnar - ADM    Wrist ADM 2.7 ?3.3 7.4 ?6.0 100 Wrist - ADM 7   25.2    B.Elbow ADM 5.7  6.3  85.6 B.Elbow - Wrist 18 59 ?49 22.6    A.Elbow ADM 8.8  5.8  91.8 A.Elbow - B.Elbow 10 32 ?49 23.7 L Ulnar - ADM    Wrist ADM 2.4 ?3.3 8.4 ?6.0 100 Wrist - ADM 7   29.1    B.Elbow ADM 6.0  6.3  75.6 B.Elbow - Wrist 18 50 ?49 23.1    A.Elbow ADM 8.0  6.1  96.6 A.Elbow - B.Elbow 10 50 ?49 24.3         SNC   Nerve / Sites Rec. Site Peak Lat Ref.  Amp Ref. Segments Distance Peak Diff Ref.   ms ms V V  cm ms ms R Median, Ulnar - Transcarpal comparison    Median  Palm Wrist 2.3 ?2.2 72 ?35 Median Palm - Wrist 8      Ulnar Palm Wrist 2.1 ?2.2 9 ?12 Ulnar Palm - Wrist 8         Median Palm - Ulnar Palm  0.2 ?0.4 R Median - Orthodromic (Dig II, Mid palm)    Dig II Wrist 3.2 ?3.4 19 ?10 Dig II - Wrist 13   L Median - Orthodromic (Dig II, Mid palm)    Dig II Wrist 3.2 ?3.4 12 ?10 Dig II - Wrist 13   R Ulnar - Orthodromic, (Dig V, Mid palm)    Dig V Wrist 2.7 ?3.1 5 ?5 Dig V - Wrist 11   L Ulnar - Orthodromic, (Dig V, Mid palm)    Dig V Wrist 3.0 ?3.1 6 ?5 Dig V - Wrist 47               F  Wave   Nerve F Lat Ref.  ms ms R Ulnar - ADM 31.5 ?32.0 L Ulnar - ADM 29.9 ?32.0       EMG Summary Table   Spontaneous MUAP Recruitment Muscle IA Fib PSW Fasc Other Amp Dur. Poly Pattern R. Deltoid Normal None None None _______ Normal Normal Normal Normal R. Biceps brachii Normal None None None _______ Normal Normal Normal Normal R. Triceps brachii Normal None None None _______ Normal Normal Normal Normal R. Flexor carpi radialis Normal None None None _______ Normal Normal Normal Normal R. Flexor carpi ulnaris Normal None 1+ None _______ Increased Normal Normal Reduced R. First dorsal interosseous Normal None None None _______ Increased Normal 2+ Reduced R. Cervical paraspinals Normal None None None _______ Normal Normal Normal Normal R. Extensor indicis proprius Normal None None None _______ Normal Normal Normal Normal    Procedures Procedures    Medications Ordered in ED Medications  albuterol (PROVENTIL) (2.5 MG/3ML) 0.083% nebulizer solution 5 mg (5  mg Nebulization Given 03/18/21 0908)    ED Course/ Medical Decision Making/ A&P                           Medical Decision Making Patient with COPD presenting with cough, despite recent treatments.  Currently taking prednisone.  Amount and/or Complexity of Data Reviewed Independent Historian:     Details: She is a cogent historian External Data Reviewed: radiology and notes.    Details: Recent notes from pulmonary reviewed.   She has been given Ceftin antibiotic, and prednisone.  She is followed closely by pulmonary. Radiology: ordered and independent interpretation performed.    Details: Chest x-ray-no infiltrate or edema  Risk Prescription drug management. Decision regarding hospitalization. Risk Details: Patient appears to be at baseline, with COPD.  She is currently under treatment and stable for discharge with outpatient management.  She does not meet criteria for hospitalization.  I recommend she use Robitussin-DM for cough and try an antihistamine to see if that helps her symptoms           Final Clinical Impression(s) / ED Diagnoses Final diagnoses:  Chronic obstructive pulmonary disease, unspecified COPD type [facility])    Rx / [place] Orders ED Discharge Orders     None         Daleen Bo, MD 03/18/21 1037

## 2021-03-18 NOTE — Telephone Encounter (Signed)
LOV 03/15/21 Last refill 12/15/20, #30, 0 refills  Pharmacy does not have [place] - please change rx.  Please review, thanks!

## 2021-03-18 NOTE — ED Triage Notes (Signed)
Pt to the ED with complaints of cough and congestion for the last several days. Denies fevers.

## 2021-03-18 NOTE — Telephone Encounter (Signed)
Patient left voicemail to request refill of hydrocodone 10-325 (pharmacy out of 5-325). Patient stated she went to the ED today; had a terrible coughing spell this morning and it caused her temple area to hurt.  Please advise at [phone].

## 2021-03-21 ENCOUNTER — Other Ambulatory Visit: Payer: Self-pay

## 2021-03-21 ENCOUNTER — Encounter (HOSPITAL_COMMUNITY): Payer: Self-pay

## 2021-03-21 ENCOUNTER — Other Ambulatory Visit: Payer: Self-pay | Admitting: Family Medicine

## 2021-03-21 ENCOUNTER — Emergency Department (HOSPITAL_COMMUNITY)
Admission: EM | Admit: 2021-03-21 | Discharge: 2021-03-21 | Disposition: A | Discharge status: Home / Self Care | Payer: Medicare HMO | Attending: Emergency Medicine | Admitting: Emergency Medicine

## 2021-03-21 ENCOUNTER — Telehealth: Payer: Self-pay

## 2021-03-21 DIAGNOSIS — Z7952 Long term (current) use of systemic steroids: Secondary | ICD-10-CM | POA: Diagnosis not present

## 2021-03-21 DIAGNOSIS — J441 Chronic obstructive pulmonary disease with (acute) exacerbation: Secondary | ICD-10-CM | POA: Insufficient documentation

## 2021-03-21 DIAGNOSIS — Z7951 Long term (current) use of inhaled steroids: Secondary | ICD-10-CM | POA: Diagnosis not present

## 2021-03-21 DIAGNOSIS — Z20822 Contact with and (suspected) exposure to covid-19: Secondary | ICD-10-CM | POA: Insufficient documentation

## 2021-03-21 DIAGNOSIS — R0602 Shortness of breath: Secondary | ICD-10-CM | POA: Diagnosis present

## 2021-03-21 LAB — CBC WITH DIFFERENTIAL/PLATELET
Abs Immature Granulocytes: 0.06 10*3/uL (ref 0.00–0.07)
Basophils Absolute: 0 10*3/uL (ref 0.0–0.1)
Basophils Relative: 0 %
Eosinophils Absolute: 0 10*3/uL (ref 0.0–0.5)
Eosinophils Relative: 0 %
HCT: 45.2 % (ref 36.0–46.0)
Hemoglobin: 14.6 g/dL (ref 12.0–15.0)
Immature Granulocytes: 1 %
Lymphocytes Relative: 12 %
Lymphs Abs: 1.3 10*3/uL (ref 0.7–4.0)
MCH: 28.8 pg (ref 26.0–34.0)
MCHC: 32.3 g/dL (ref 30.0–36.0)
MCV: 89.2 fL (ref 80.0–100.0)
Monocytes Absolute: 1.3 10*3/uL — ABNORMAL HIGH (ref 0.1–1.0)
Monocytes Relative: 12 %
Neutro Abs: 8 10*3/uL — ABNORMAL HIGH (ref 1.7–7.7)
Neutrophils Relative %: 75 %
Platelets: 324 10*3/uL (ref 150–400)
RBC: 5.07 MIL/uL (ref 3.87–5.11)
RDW: 14.6 % (ref 11.5–15.5)
WBC: 10.7 10*3/uL — ABNORMAL HIGH (ref 4.0–10.5)
nRBC: 0 % (ref 0.0–0.2)

## 2021-03-21 LAB — RESP PANEL BY RT-PCR (FLU A&B, COVID) ARPGX2
Influenza A by PCR: NEGATIVE
Influenza B by PCR: NEGATIVE
SARS Coronavirus 2 by RT PCR: NEGATIVE

## 2021-03-21 LAB — BASIC METABOLIC PANEL
Anion gap: 4 — ABNORMAL LOW (ref 5–15)
BUN: 15 mg/dL (ref 8–23)
CO2: 28 mmol/L (ref 22–32)
Calcium: 8.9 mg/dL (ref 8.9–10.3)
Chloride: 104 mmol/L (ref 98–111)
Creatinine, Ser: 0.69 mg/dL (ref 0.44–1.00)
GFR, Estimated: >60 mL/min (ref >60)
Glucose, Bld: 103 mg/dL — ABNORMAL HIGH (ref 70–99)
Potassium: 3.6 mmol/L (ref 3.5–5.1)
Sodium: 136 mmol/L (ref 135–145)

## 2021-03-21 MED ORDER — MAGNESIUM SULFATE 2 GM/50ML IV SOLN
2.0000 g | Freq: Once | INTRAVENOUS | Status: AC
  Administered 2021-03-21: 2 g via INTRAVENOUS
  Filled 2021-03-21: qty 50

## 2021-03-21 MED ORDER — IPRATROPIUM-ALBUTEROL 0.5-2.5 (3) MG/3ML IN SOLN
3.0000 mL | Freq: Once | RESPIRATORY_TRACT | Status: AC
  Administered 2021-03-21: 3 mL via RESPIRATORY_TRACT
  Filled 2021-03-21: qty 3

## 2021-03-21 MED ORDER — HYDROCODONE-ACETAMINOPHEN 7.5-325 MG PO TABS: 1.0000 | ORAL_TABLET | Freq: Four times a day (QID) | ORAL | 0 refills | Status: DC | PRN

## 2021-03-21 MED ORDER — PREDNISONE 10 MG PO TABS: ORAL_TABLET | ORAL | 0 refills | Status: DC

## 2021-03-21 MED ORDER — METHYLPREDNISOLONE SODIUM SUCC 125 MG IJ SOLR
60.0000 mg | Freq: Once | INTRAMUSCULAR | Status: AC
Start: 2021-03-21 — End: 2021-03-21
  Administered 2021-03-21: 60 mg via INTRAVENOUS
  Filled 2021-03-21: qty 2

## 2021-03-21 NOTE — Discharge Instructions (Addendum)
Your next dose of prednisone or steroids will be due tomorrow morning with breakfast.  Please take it on a taper as prescribed.  For the next 2 days, I would like you to give yourself a breathing treatment every 4 hours with the albuterol nebulizer while awake.

## 2021-03-21 NOTE — ED Triage Notes (Signed)
Patient arrives  via POV, c/o productive cough , with thick yellow sputum and shortness of breath. Pt. stated that she was in the ER 2 days ago with shortness of breath. Patient stated that she has a hx of COPD. Taking  nebulizer treatment at home not helping. Currently on O2 @ 2 L/min at home.

## 2021-03-21 NOTE — Telephone Encounter (Signed)
From My Chart message sent in by contact person: Good afternoon Dr. Dennard Schaumann.  This is Mariann Laster, I am a Scientist, research (physical sciences) at [employer].  One of my congregants is Aunica Dauphinee. She has gone to the ED at [facility] twice in the last 5 days. She calls me to ask questions about her treatment. I also have access to EPIC. Ms. Picone living conditions may attribute to the problem.  I believe she is exposed to mold on a daily.  Is there a test for mold?  If possible please do not share my name with her. Do what you can! Thank you Mariann Laster   Please advise, thank you!

## 2021-03-21 NOTE — ED Provider Notes (Signed)
[facility] EMERGENCY DEPARTMENT Provider Note   CSN: [id number] Arrival date & time: 03/21/21  1001     History  Chief Complaint  Patient presents with   Shortness of Breath    Mary Blevins is a 76 y.o. female with a history of COPD (on 2 L intermittently at home as needed, no smoking history), presenting back to the ED with shortness of breath.  The patient was evaluated 3 days ago for similar shortness of breath, which she attributed to the sudden weather changes.  She was given breathing treatments in the ER and she states she was discharged on a very short course of steroids, only 3 days, which seem to be helping but when she ran out her symptoms returned.  She does complain of a chronic cough.  She denies fevers at home.  She has a nebulizer machine and gave her self 2 back-to-back treatments this morning which helped.  Per my review of the medical records the patient had a two-view chest x-ray performed 3 days ago which was unremarkable.  She did not have a COVID or flu swab done at the time.  HPI     Home Medications Prior to Admission medications   Medication Sig Start Date End Date Taking? Authorizing Provider  predniSONE (DELTASONE) 10 MG tablet Take 4 tablets (40 mg total) by mouth daily with breakfast for 3 days, THEN 2 tablets (20 mg total) daily with breakfast for 3 days, THEN 1 tablet (10 mg total) daily with breakfast for 2 days. 03/22/21 03/30/21 Yes Briel Gallicchio, Carola Rhine, MD  albuterol (PROVENTIL) (2.5 MG/3ML) 0.083% nebulizer solution Take 3 mLs (2.5 mg total) by nebulization every 6 (six) hours as needed for wheezing or shortness of breath. Patient taking differently: Take 2.5 mg by nebulization daily. 06/03/20   [name], MD  budesonide-formoterol (SYMBICORT) 160-4.5 MCG/ACT inhaler Inhale 2 puffs into the lungs 2 (two) times daily. 03/14/21   [name], MD  DULoxetine (CYMBALTA) 60 MG capsule  11/09/20   [provider]  fluticasone  (FLONASE) 50 MCG/ACT nasal spray SPRAY 2 SPRAYS INTO EACH NOSTRIL EVERY DAY 07/11/18   [name], MD  gabapentin (NEURONTIN) 300 MG capsule Take 300 mg by mouth daily. 03/09/21   [provider]  HYDROcodone-acetaminophen (NORCO) 5-325 MG tablet Take 1 tablet by mouth every 6 (six) hours as needed for moderate pain. 12/15/20   Pete Pelt, PA-C  HYDROcodone-acetaminophen (NORCO) 7.5-325 MG tablet Take 1 tablet by mouth every 6 (six) hours as needed for moderate pain. 03/21/21   [name], MD  hydrOXYzine (ATARAX/VISTARIL) 25 MG tablet Take 1 tablet (25 mg total) by mouth 3 (three) times daily as needed for itching. 10/13/20   [name], MD  levothyroxine (SYNTHROID) 50 MCG tablet TAKE 1 TABLET BY MOUTH EVERY DAY BEFORE BREAKFAST 01/18/21   [name], MD  predniSONE (DELTASONE) 10 MG tablet Take 10 mg by mouth daily with breakfast.    [provider]  Propylene Glycol-Glycerin 0.6-0.6 % SOLN Place 1 drop into both eyes 2 (two) times daily. Artifical tears    [provider]  rosuvastatin (CRESTOR) 5 MG tablet Take 1 tablet (5 mg total) by mouth daily. 09/17/20   Belva Crome, MD  sodium chloride (OCEAN) 0.65 % SOLN nasal spray Place 1 spray into both nostrils daily.    [provider]  Tiotropium Bromide Monohydrate (SPIRIVA RESPIMAT) 2.5 MCG/ACT AERS Inhale 2 puffs into the lungs daily.  02/25/21   Tanda Rockers, MD  tiZANidine (ZANAFLEX) 4 MG tablet TAKE 1 TABLET BY MOUTH EVERY 6 HOURS AS NEEDED FOR MUSCLE SPASMS. 03/10/21   [name], MD  verapamil (CALAN-SR) 120 MG CR tablet TAKE 1 TABLET EVERY DAY 11/23/20   [name], MD      Allergies    Oxycodone and Meloxicam    Review of Systems   Review of Systems  Physical Exam Updated Vital Signs BP 130/80    Pulse 75    Temp 98.3 F (36.8 C) (Oral)    Resp 16    Ht 5\' 5"  (1.651 m)    Wt 79.8 kg    SpO2 97%    BMI 29.29 kg/m  Physical Exam Constitutional:       General: She is not in acute distress. HENT:     Head: Normocephalic and atraumatic.  Eyes:     Conjunctiva/sclera: Conjunctivae normal.     Pupils: Pupils are equal, round, and reactive to light.  Cardiovascular:     Rate and Rhythm: Normal rate and regular rhythm.  Pulmonary:     Effort: Pulmonary effort is normal. No respiratory distress.     Comments: 95% on 2 L baseline nasal cannula End expiratory wheezing bilaterally, speaking in full sentences, no retractions Abdominal:     General: There is no distension.     Tenderness: There is no abdominal tenderness.  Skin:    General: Skin is warm and dry.  Neurological:     General: No focal deficit present.     Mental Status: She is alert. Mental status is at baseline.  Psychiatric:        Mood and Affect: Mood normal.        Behavior: Behavior normal.    ED Results / Procedures / Treatments   Labs (all labs ordered are listed, but only abnormal results are displayed) Labs Reviewed  BASIC METABOLIC PANEL - Abnormal; Notable for the following components:      Result Value   Glucose, Bld 103 (*)    Anion gap 4 (*)    All other components within normal limits  CBC WITH DIFFERENTIAL/PLATELET - Abnormal; Notable for the following components:   WBC 10.7 (*)    Neutro Abs 8.0 (*)    Monocytes Absolute 1.3 (*)    All other components within normal limits  RESP PANEL BY RT-PCR (FLU A&B, COVID) [id number]    EKG EKG Interpretation  Date/Time:  Monday March 21 2021 10:21:26 EST Ventricular Rate:  86 PR Interval:  139 QRS Duration: 103 QT Interval:  394 QTC Calculation: 472 R Axis:   84 Text Interpretation: Sinus rhythm Ventricular premature complex Borderline right axis deviation Confirmed by Octaviano Glow [phone]) on 03/21/2021 11:16:19 AM  Radiology No results found.  Procedures Procedures    Medications Ordered in ED Medications  ipratropium-albuterol (DUONEB) 0.5-2.5 (3) MG/3ML nebulizer solution 3 mL (3 mLs  Nebulization Given 03/21/21 1126)  magnesium sulfate IVPB 2 g 50 mL (0 g Intravenous Stopped 03/21/21 1205)  methylPREDNISolone sodium succinate (SOLU-MEDROL) 125 mg/2 mL injection 60 mg (60 mg Intravenous Given 03/21/21 1056)    ED Course/ Medical Decision Making/ A&P Clinical Course as of 03/21/21 1227  Mon Mar 21, 2021  1211 Patient with significant symptomatic improvement of her symptoms after breathing treatment and magnesium.  I will prescribe a longer taper of prednisone as she may need this for her brittle COPD, and advised breathing treatments every 4 hours  at home.  She verbalized understanding [MT]    Clinical Course User Index [MT] Chadric Kimberley, Carola Rhine, MD                           Medical Decision Making Amount and/or Complexity of Data Reviewed Labs: ordered.  Risk Prescription drug management.   Patient is here with suspected rebound COPD exacerbation in the setting of running out of steroids at home, weather changes.  She does not have any new oxygen requirement, is not requiring BiPAP or intubation.  She is stable on 2 L.  She is wheezing.  I have ordered DuoNebs, IV magnesium, and some IV steroids at a lower dose with Solu-Medrol given that she is already on steroids.  I reviewed her labs which were basic labs and per my interpretation were notable for no significant electrolyte derangement or dehydration.  Minor leukocytosis in the setting of steroids to be expected.  I also reviewed her external records and recent ED visits, with a normal chest x-ray 3 days ago this is less likely to be acute bacterial pneumonia.  COVID and flu test are         Final Clinical Impression(s) / ED Diagnoses Final diagnoses:  COPD exacerbation ([place])    Rx / DC Orders ED Discharge Orders          Ordered    predniSONE (DELTASONE) 10 MG tablet        03/21/21 1213              Wyvonnia Dusky, MD 03/21/21 1227

## 2021-03-24 ENCOUNTER — Ambulatory Visit (INDEPENDENT_AMBULATORY_CARE_PROVIDER_SITE_OTHER): Payer: Medicare HMO | Admitting: Family Medicine

## 2021-03-24 ENCOUNTER — Other Ambulatory Visit: Payer: Self-pay

## 2021-03-24 VITALS — BP 138/82 | HR 99 | Temp 97.0°F | Resp 24 | Ht 65.0 in | Wt 164.0 lb

## 2021-03-24 DIAGNOSIS — J441 Chronic obstructive pulmonary disease with (acute) exacerbation: Secondary | ICD-10-CM | POA: Diagnosis not present

## 2021-03-24 MED ORDER — LEVOFLOXACIN 500 MG PO TABS: 500.0000 mg | ORAL_TABLET | Freq: Every day | ORAL | 0 refills | Status: AC

## 2021-03-24 MED ORDER — BENZONATATE 100 MG PO CAPS: 200.0000 mg | ORAL_CAPSULE | Freq: Three times a day (TID) | ORAL | 0 refills | Status: DC | PRN

## 2021-03-24 MED ORDER — METHYLPREDNISOLONE ACETATE 80 MG/ML IJ SUSP
80.0000 mg | Freq: Once | INTRAMUSCULAR | Status: AC
  Administered 2021-03-24: 80 mg via INTRAMUSCULAR

## 2021-03-24 MED ORDER — PREDNISONE 20 MG PO TABS: 60.0000 mg | ORAL_TABLET | Freq: Every day | ORAL | 0 refills | Status: DC

## 2021-03-24 MED ORDER — IPRATROPIUM-ALBUTEROL 0.5-2.5 (3) MG/3ML IN SOLN
3.0000 mL | Freq: Once | RESPIRATORY_TRACT | Status: AC
  Administered 2021-03-24: 3 mL via RESPIRATORY_TRACT

## 2021-03-24 NOTE — Progress Notes (Signed)
Subjective:    Patient ID: Mary Blevins, female    DOB: 13-Jul-1945, 76 y.o.   MRN: [id number]  Shortness of Breath  Cough Associated symptoms include shortness of breath.   Patient was seen in the emergency room for shortness of breath originally on February 24 and then again on February 27.  Original chest x-ray on February 24 was clear.  Lab work on February 27 showed a mild leukocytosis but was otherwise normal.  COVID test and flu test were negative.  The patient was given Depo-Medrol and was discharged home on prednisone.  She took 40 mg on February 28, 40 mg on March 1 and 20 mg this morning.  Today she presents with tachypnea.  She has expiratory wheezing.  She is short of breath and unable to speak complete sentences.  Pulse oximetry was 92% on room air.  Patient has rhonchorous breath sounds throughout.  She is coughing up thick mucus that is green.  She denies any hemoptysis.  She denies any chest pain.  She denies any fever.  She is clearly in a COPD exacerbation.  She states she has been using her albuterol nebulizer every 4 hours at home with little relief.  Her last dose was at 8:00 this morning.  Past Medical History:  Diagnosis Date   Arthritis    COPD (chronic obstructive pulmonary disease) (HCC)    Dyspnea    Elevated lipids    Generalized headaches    History of pneumonia    Hyperlipidemia    Hypertension    Hypothyroidism    MVP (mitral valve prolapse)    palpitations   Osteopenia    Seasonal allergies    Sinusitis    Tubular adenoma of colon    Past Surgical History:  Procedure Laterality Date   CATARACT EXTRACTION Left    COLONOSCOPY WITH PROPOFOL N/A 01/06/2016   Procedure: COLONOSCOPY WITH PROPOFOL;  Surgeon: Doran Stabler, MD;  Location: [facility];  Service: Gastroenterology;  Laterality: N/A;   DILATION AND CURETTAGE OF UTERUS     THYROIDECTOMY     TONSILLECTOMY     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   Procedure: RIGHT TOTAL HIP  ARTHROPLASTY ANTERIOR APPROACH;  Surgeon: Mcarthur Rossetti, MD;  Location: [facility];  Service: Orthopedics;  Laterality: Right;  Needs RNFA   Current Outpatient Medications on File Prior to Visit  Medication Sig Dispense Refill   albuterol (PROVENTIL) (2.5 MG/3ML) 0.083% nebulizer solution Take 3 mLs (2.5 mg total) by nebulization every 6 (six) hours as needed for wheezing or shortness of breath. (Patient taking differently: Take 2.5 mg by nebulization daily.) 360 mL 3   budesonide-formoterol (SYMBICORT) 160-4.5 MCG/ACT inhaler Inhale 2 puffs into the lungs 2 (two) times daily. 1 each 5   DULoxetine (CYMBALTA) 60 MG capsule      fluticasone (FLONASE) 50 MCG/ACT nasal spray SPRAY 2 SPRAYS INTO EACH NOSTRIL EVERY DAY 16 g 2   gabapentin (NEURONTIN) 300 MG capsule Take 300 mg by mouth daily.     HYDROcodone-acetaminophen (NORCO) 5-325 MG tablet Take 1 tablet by mouth every 6 (six) hours as needed for moderate pain. 30 tablet 0   HYDROcodone-acetaminophen (NORCO) 7.5-325 MG tablet Take 1 tablet by mouth every 6 (six) hours as needed for moderate pain. 30 tablet 0   hydrOXYzine (ATARAX/VISTARIL) 25 MG tablet Take 1 tablet (25 mg total) by mouth 3 (three) times daily as needed for itching. 30 tablet 6   levothyroxine (SYNTHROID) 50 MCG tablet  TAKE 1 TABLET BY MOUTH EVERY DAY BEFORE BREAKFAST 90 tablet 3   predniSONE (DELTASONE) 10 MG tablet Take 10 mg by mouth daily with breakfast.     predniSONE (DELTASONE) 10 MG tablet Take 4 tablets (40 mg total) by mouth daily with breakfast for 3 days, THEN 2 tablets (20 mg total) daily with breakfast for 3 days, THEN 1 tablet (10 mg total) daily with breakfast for 2 days. 15 tablet 0   Propylene Glycol-Glycerin 0.6-0.6 % SOLN Place 1 drop into both eyes 2 (two) times daily. Artifical tears     rosuvastatin (CRESTOR) 5 MG tablet Take 1 tablet (5 mg total) by mouth daily. 90 tablet 3   sodium chloride (OCEAN) 0.65 % SOLN nasal spray Place 1 spray into both  nostrils daily.     Tiotropium Bromide Monohydrate (SPIRIVA RESPIMAT) 2.5 MCG/ACT AERS Inhale 2 puffs into the lungs daily. 4 g 0   tiZANidine (ZANAFLEX) 4 MG tablet TAKE 1 TABLET BY MOUTH EVERY 6 HOURS AS NEEDED FOR MUSCLE SPASMS. 30 tablet 0   verapamil (CALAN-SR) 120 MG CR tablet TAKE 1 TABLET EVERY DAY 90 tablet 3   No current facility-administered medications on file prior to visit.   Allergies  Allergen Reactions   Oxycodone Other (See Comments)    Hallucinations    Meloxicam Other (See Comments)    BAD DREAMS, SAD THOUGHTS   Social History   Socioeconomic History   Marital status: Widowed    Spouse name: Not on file   Number of children: 0   Years of education: 43 B   Highest education level: Not on file  Occupational History    Employer: [employer]  Tobacco Use   Smoking status: Former    Types: Cigarettes    Quit date: 07/24/2005    Years since quitting: 15.6   Smokeless tobacco: Never  Vaping Use   Vaping Use: Never used  Substance and Sexual Activity   Alcohol use: Yes    Comment: rare   Drug use: No   Sexual activity: Not on file  Other Topics Concern   Not on file  Social History Narrative   Lives alone   Social Determinants of Health   Financial Resource Strain: Low Risk    Difficulty of Paying Living Expenses: Not hard at all  Food Insecurity: No Food Insecurity   Worried About Charity fundraiser in the Last Year: Never true   [place] in the Last Year: Never true  Transportation Needs: No Transportation Needs   Lack of Transportation (Medical): No   Lack of Transportation (Non-Medical): No  Physical Activity: Inactive   Days of Exercise per Week: 0 days   Minutes of Exercise per Session: 0 min  Stress: No Stress Concern Present   Feeling of Stress : Only a little  Social Connections: Moderately Isolated   Frequency of Communication with Friends and Family: More than three times a week   Frequency of Social Gatherings  with Friends and Family: More than three times a week   Attends Religious Services: More than 4 times per year   Active Member of [employer] or Organizations: No   Attends Archivist Meetings: Never   Marital Status: Widowed  Human resources officer Violence: Not At Risk   Fear of Current or Ex-Partner: No   Emotionally Abused: No   Physically Abused: No   Sexually Abused: No     Review of Systems  Respiratory:  Positive for cough  and shortness of breath.   All other systems reviewed and are negative.     Objective:   Physical Exam Vitals reviewed.  Constitutional:      General: She is in acute distress.     Appearance: She is well-developed and normal weight. She is not ill-appearing.  Cardiovascular:     Rate and Rhythm: Normal rate and regular rhythm.     Heart sounds: Normal heart sounds. No murmur heard.   No friction rub. No gallop.  Pulmonary:     Effort: Tachypnea and accessory muscle usage present. No respiratory distress.     Breath sounds: Decreased air movement and transmitted upper airway sounds present. Examination of the right-upper field reveals wheezing and rhonchi. Examination of the left-upper field reveals wheezing and rhonchi. Examination of the right-middle field reveals wheezing and rhonchi. Examination of the left-middle field reveals wheezing and rhonchi. Examination of the right-lower field reveals wheezing and rhonchi. Examination of the left-lower field reveals wheezing and rhonchi. Decreased breath sounds, wheezing and rhonchi present.  Musculoskeletal:     Right lower leg: No edema.     Left lower leg: No edema.  Neurological:     Mental Status: She is alert.          Assessment & Plan:   COPD with acute exacerbation ([place]) At 1055 the patient was given a DuoNeb x1. At 1100 the patient was given 80 mg of Depo Medrol. At 1115, the patient is more calm.  She still has an increased respiratory rate however the work of breathing has improved and  her lungs are more clear.  I explained to the patient that I will monitor her over the next 60 minutes to see how she is doing.  At the present time, I feel the patient may require hospitalization.  However she will be monitored in clinic to see if she continues to improve after her nebulizer treatment. At 1130- patient is calm, resp rate is 18.  Lungs are much clearer.  No rhonchi, still faint expiratory wheezing but much improved. NAD at this time.  No accessory muscle use, no tachypnea.  At 1200-patient is sitting calmly in the exam room.  Her respiratory rate is around 14 breaths/min.  Her lungs still remain clear although she has a faint expiratory wheeze bilaterally.  She states that she feels like she can go home.  She states she feels much better.  I will start the patient on Levaquin 500 mg a day for 7 days given the increased sputum production, purulent sputum, and chest congestion.  I will also start the patient on prednisone 60 mg a day for 7 days and encouraged her to continue to use DuoNebs every 4-6 hours as needed.  Go to the emergency room immediately if worsening.  Otherwise recheck next week if no better or sooner if worse.

## 2021-03-29 ENCOUNTER — Telehealth: Payer: Self-pay | Admitting: Family Medicine

## 2021-03-29 NOTE — Telephone Encounter (Signed)
We do not refill medications such as antibiotics and prednisone given for acute conditions. If patient feels she needs more treatment she will need OV to discuss. ? ?[place] ?

## 2021-03-29 NOTE — Telephone Encounter (Signed)
Patient called to request refills of  ? ?predniSONE (DELTASONE) 20 MG tablet [[id number]]  ? ?levofloxacin (LEVAQUIN) 500 MG tablet [[id number]]  ? ?benzonatate (TESSALON) 100 MG capsule [[id number]]  ? ? ?Pharmacy confirmed as ? ?[id number]- [place] [place] ?[id number] ?Phone:  [phone] Fax:  [phone] ?DEA #:  [id number]? ?Patient states the combination of meds is working really well for her and she'd like to continue using them; only has 1 pill left of the levofloxacin. ? ?Please advise at [phone]?

## 2021-03-29 NOTE — Telephone Encounter (Signed)
Spoke with patient and she states she is feeling better. She wanted to continue taking Levaquin and prednisone to try and not get sick again. Explained to patient these are considered acute short-term medications and not intended to be refilled. Advised patient if she starts feeling worse she would need to schedule OV for further evaluation. Also advised patient she needs to schedule follow up with Pulm to further evaluate any possible causes of her continued breathing issues. Patient voiced understanding and will call Pulm. Nothing further needed at this time.  ? ?

## 2021-03-30 ENCOUNTER — Other Ambulatory Visit: Payer: Self-pay | Admitting: Family Medicine

## 2021-04-01 ENCOUNTER — Ambulatory Visit (INDEPENDENT_AMBULATORY_CARE_PROVIDER_SITE_OTHER): Payer: Medicare HMO | Admitting: Family Medicine

## 2021-04-01 ENCOUNTER — Encounter: Payer: Self-pay | Admitting: Family Medicine

## 2021-04-01 ENCOUNTER — Other Ambulatory Visit: Payer: Self-pay

## 2021-04-01 VITALS — BP 138/82 | HR 82 | Temp 96.6°F | Resp 18 | Ht 65.0 in | Wt 164.0 lb

## 2021-04-01 DIAGNOSIS — J441 Chronic obstructive pulmonary disease with (acute) exacerbation: Secondary | ICD-10-CM

## 2021-04-01 NOTE — Progress Notes (Signed)
Subjective:    Patient ID: Mary Blevins, female    DOB: 04-Oct-1945, 76 y.o.   MRN: [id number]  Shortness of Breath  Cough Associated symptoms include shortness of breath.  03/24/21 Patient was seen in the emergency room for shortness of breath originally on February 24 and then again on February 27.  Original chest x-ray on February 24 was clear.  Lab work on February 27 showed a mild leukocytosis but was otherwise normal.  COVID test and flu test were negative.  The patient was given Depo-Medrol and was discharged home on prednisone.  She took 40 mg on February 28, 40 mg on March 1 and 20 mg this morning.  Today she presents with tachypnea.  She has expiratory wheezing.  She is short of breath and unable to speak complete sentences.  Pulse oximetry was 92% on room air.  Patient has rhonchorous breath sounds throughout.  She is coughing up thick mucus that is green.  She denies any hemoptysis.  She denies any chest pain.  She denies any fever.  She is clearly in a COPD exacerbation.  She states she has been using her albuterol nebulizer every 4 hours at home with little relief.  Her last dose was at 8:00 this morning.  At that time, my plan was: At 1055 the patient was given a DuoNeb x1. At 1100 the patient was given 80 mg of Depo Medrol. At 1115, the patient is more calm.  She still has an increased respiratory rate however the work of breathing has improved and her lungs are more clear.  I explained to the patient that I will monitor her over the next 60 minutes to see how she is doing.  At the present time, I feel the patient may require hospitalization.  However she will be monitored in clinic to see if she continues to improve after her nebulizer treatment. At 1130- patient is calm, resp rate is 18.  Lungs are much clearer.  No rhonchi, still faint expiratory wheezing but much improved. NAD at this time.  No accessory muscle use, no tachypnea.  At 1200-patient is sitting calmly in the exam  room.  Her respiratory rate is around 14 breaths/min.  Her lungs still remain clear although she has a faint expiratory wheeze bilaterally.  She states that she feels like she can go home.  She states she feels much better.  I will start the patient on Levaquin 500 mg a day for 7 days given the increased sputum production, purulent sputum, and chest congestion.  I will also start the patient on prednisone 60 mg a day for 7 days and encouraged her to continue to use DuoNebs every 4-6 hours as needed.  Go to the emergency room immediately if worsening.  Otherwise recheck next week if no better or sooner if worse.  04/01/21 Patient is no longer on prednisone.  She completed that a few days ago.  She is still doing her nebulizer treatments every 4-6 hours.  On examination today there is no wheezing.  Her breath sounds are back to her baseline.  She does have mucus and rattling in her upper airways that can be auscultated with forced expiration but tickly in the anterior portion of her chest around her sternum.  Otherwise she is back to normal.  She denies any chest pain.  She denies any fever.  She denies any pleurisy.  She does report that she has a difficult time bringing up mucus at times.  Past  Medical History:  Diagnosis Date   Arthritis    COPD (chronic obstructive pulmonary disease) (HCC)    Dyspnea    Elevated lipids    Generalized headaches    History of pneumonia    Hyperlipidemia    Hypertension    Hypothyroidism    MVP (mitral valve prolapse)    palpitations   Osteopenia    Seasonal allergies    Sinusitis    Tubular adenoma of colon    Past Surgical History:  Procedure Laterality Date   CATARACT EXTRACTION Left    COLONOSCOPY WITH PROPOFOL N/A 01/06/2016   Procedure: COLONOSCOPY WITH PROPOFOL;  Surgeon: Doran Stabler, MD;  Location: [facility];  Service: Gastroenterology;  Laterality: N/A;   DILATION AND CURETTAGE OF UTERUS     THYROIDECTOMY     TONSILLECTOMY     TOTAL HIP  ARTHROPLASTY Right 09/24/2020   Procedure: RIGHT TOTAL HIP ARTHROPLASTY ANTERIOR APPROACH;  Surgeon: Mcarthur Rossetti, MD;  Location: [facility];  Service: Orthopedics;  Laterality: Right;  Needs RNFA   Current Outpatient Medications on File Prior to Visit  Medication Sig Dispense Refill   albuterol (PROVENTIL) (2.5 MG/3ML) 0.083% nebulizer solution Take 3 mLs (2.5 mg total) by nebulization every 6 (six) hours as needed for wheezing or shortness of breath. (Patient taking differently: Take 2.5 mg by nebulization daily.) 360 mL 3   benzonatate (TESSALON) 100 MG capsule Take 2 capsules (200 mg total) by mouth 3 (three) times daily as needed for cough. 30 capsule 0   budesonide-formoterol (SYMBICORT) 160-4.5 MCG/ACT inhaler Inhale 2 puffs into the lungs 2 (two) times daily. 1 each 5   DULoxetine (CYMBALTA) 60 MG capsule      fluticasone (FLONASE) 50 MCG/ACT nasal spray SPRAY 2 SPRAYS INTO EACH NOSTRIL EVERY DAY 16 g 2   gabapentin (NEURONTIN) 300 MG capsule Take 300 mg by mouth daily.     HYDROcodone-acetaminophen (NORCO) 5-325 MG tablet Take 1 tablet by mouth every 6 (six) hours as needed for moderate pain. 30 tablet 0   HYDROcodone-acetaminophen (NORCO) 7.5-325 MG tablet Take 1 tablet by mouth every 6 (six) hours as needed for moderate pain. 30 tablet 0   hydrOXYzine (ATARAX/VISTARIL) 25 MG tablet Take 1 tablet (25 mg total) by mouth 3 (three) times daily as needed for itching. 30 tablet 6   levothyroxine (SYNTHROID) 50 MCG tablet TAKE 1 TABLET BY MOUTH EVERY DAY BEFORE BREAKFAST 90 tablet 3   predniSONE (DELTASONE) 20 MG tablet Take 3 tablets (60 mg total) by mouth daily with breakfast. 21 tablet 0   Propylene Glycol-Glycerin 0.6-0.6 % SOLN Place 1 drop into both eyes 2 (two) times daily. Artifical tears     rosuvastatin (CRESTOR) 5 MG tablet Take 1 tablet (5 mg total) by mouth daily. 90 tablet 3   sodium chloride (OCEAN) 0.65 % SOLN nasal spray Place 1 spray into both nostrils daily.      Tiotropium Bromide Monohydrate (SPIRIVA RESPIMAT) 2.5 MCG/ACT AERS Inhale 2 puffs into the lungs daily. 4 g 0   tiZANidine (ZANAFLEX) 4 MG tablet TAKE 1 TABLET BY MOUTH EVERY 6 HOURS AS NEEDED FOR MUSCLE SPASMS. 30 tablet 0   verapamil (CALAN-SR) 120 MG CR tablet TAKE 1 TABLET EVERY DAY 90 tablet 3   No current facility-administered medications on file prior to visit.   Allergies  Allergen Reactions   Oxycodone Other (See Comments)    Hallucinations    Meloxicam Other (See Comments)    BAD DREAMS, SAD THOUGHTS  Social History   Socioeconomic History   Marital status: Widowed    Spouse name: Not on file   Number of children: 0   Years of education: 14 B   Highest education level: Not on file  Occupational History    Employer: [employer]  Tobacco Use   Smoking status: Former    Types: Cigarettes    Quit date: 07/24/2005    Years since quitting: 15.6   Smokeless tobacco: Never  Vaping Use   Vaping Use: Never used  Substance and Sexual Activity   Alcohol use: Yes    Comment: rare   Drug use: No   Sexual activity: Not on file  Other Topics Concern   Not on file  Social History Narrative   Lives alone   Social Determinants of Health   Financial Resource Strain: Low Risk    Difficulty of Paying Living Expenses: Not hard at all  Food Insecurity: No Food Insecurity   Worried About Charity fundraiser in the Last Year: Never true   [place] in the Last Year: Never true  Transportation Needs: No Transportation Needs   Lack of Transportation (Medical): No   Lack of Transportation (Non-Medical): No  Physical Activity: Inactive   Days of Exercise per Week: 0 days   Minutes of Exercise per Session: 0 min  Stress: No Stress Concern Present   Feeling of Stress : Only a little  Social Connections: Moderately Isolated   Frequency of Communication with Friends and Family: More than three times a week   Frequency of Social Gatherings with Friends and  Family: More than three times a week   Attends Religious Services: More than 4 times per year   Active Member of [employer] or Organizations: No   Attends Archivist Meetings: Never   Marital Status: Widowed  Human resources officer Violence: Not At Risk   Fear of Current or Ex-Partner: No   Emotionally Abused: No   Physically Abused: No   Sexually Abused: No     Review of Systems  Respiratory:  Positive for cough and shortness of breath.   All other systems reviewed and are negative.     Objective:   Physical Exam Vitals reviewed.  Constitutional:      General: She is not in acute distress.    Appearance: She is well-developed and normal weight. She is not ill-appearing.  Cardiovascular:     Rate and Rhythm: Normal rate and regular rhythm.     Pulses: Normal pulses.     Heart sounds: Normal heart sounds. No murmur heard.   No friction rub. No gallop.  Pulmonary:     Effort: Pulmonary effort is normal. No tachypnea, accessory muscle usage, prolonged expiration or respiratory distress.     Breath sounds: Normal breath sounds and air entry. No stridor, decreased air movement or transmitted upper airway sounds. No decreased breath sounds, wheezing, rhonchi or rales.  Musculoskeletal:     Right lower leg: No edema.     Left lower leg: No edema.  Neurological:     Mental Status: She is alert.          Assessment & Plan:   COPD with acute exacerbation ([place]) I have asked the patient to discontinue nebulizer treatments and only use them if necessary.  I have asked her to add Mucinex 12-hour 1 pill twice a day indefinitely to try to help thin the mucus in her airway so that she can more easily  cough it up.  I believe that when she gets strangled on the mucus she panics and that only exacerbates her symptomatology.  I do believe the Mucinex will help with some of the symptoms.  Follow-up as needed.

## 2021-04-04 ENCOUNTER — Other Ambulatory Visit: Payer: Self-pay | Admitting: Family Medicine

## 2021-04-07 ENCOUNTER — Emergency Department (HOSPITAL_COMMUNITY): Payer: Medicare HMO

## 2021-04-07 ENCOUNTER — Telehealth: Payer: Self-pay

## 2021-04-07 ENCOUNTER — Other Ambulatory Visit: Payer: Self-pay | Admitting: Family Medicine

## 2021-04-07 ENCOUNTER — Encounter (HOSPITAL_COMMUNITY): Payer: Self-pay | Admitting: *Deleted

## 2021-04-07 ENCOUNTER — Emergency Department (HOSPITAL_COMMUNITY)
Admission: EM | Admit: 2021-04-07 | Discharge: 2021-04-07 | Disposition: A | Discharge status: Home / Self Care | Payer: Medicare HMO | Attending: Emergency Medicine | Admitting: Emergency Medicine

## 2021-04-07 DIAGNOSIS — J441 Chronic obstructive pulmonary disease with (acute) exacerbation: Secondary | ICD-10-CM | POA: Insufficient documentation

## 2021-04-07 DIAGNOSIS — R0602 Shortness of breath: Secondary | ICD-10-CM | POA: Diagnosis not present

## 2021-04-07 DIAGNOSIS — R059 Cough, unspecified: Secondary | ICD-10-CM | POA: Diagnosis not present

## 2021-04-07 LAB — BASIC METABOLIC PANEL
Anion gap: 10 (ref 5–15)
BUN: 12 mg/dL (ref 8–23)
CO2: 25 mmol/L (ref 22–32)
Calcium: 8.8 mg/dL — ABNORMAL LOW (ref 8.9–10.3)
Chloride: 104 mmol/L (ref 98–111)
Creatinine, Ser: 0.84 mg/dL (ref 0.44–1.00)
GFR, Estimated: >60 mL/min (ref >60)
Glucose, Bld: 103 mg/dL — ABNORMAL HIGH (ref 70–99)
Potassium: 4.3 mmol/L (ref 3.5–5.1)
Sodium: 139 mmol/L (ref 135–145)

## 2021-04-07 LAB — CBC
HCT: 44 % (ref 36.0–46.0)
Hemoglobin: 14 g/dL (ref 12.0–15.0)
MCH: 28.6 pg (ref 26.0–34.0)
MCHC: 31.8 g/dL (ref 30.0–36.0)
MCV: 89.8 fL (ref 80.0–100.0)
Platelets: 279 10*3/uL (ref 150–400)
RBC: 4.9 MIL/uL (ref 3.87–5.11)
RDW: 14.6 % (ref 11.5–15.5)
WBC: 12.6 10*3/uL — ABNORMAL HIGH (ref 4.0–10.5)
nRBC: 0 % (ref 0.0–0.2)

## 2021-04-07 MED ORDER — PREDNISONE 20 MG PO TABS
40.0000 mg | ORAL_TABLET | Freq: Once | ORAL | Status: AC
  Administered 2021-04-07: 40 mg via ORAL
  Filled 2021-04-07: qty 2

## 2021-04-07 MED ORDER — IPRATROPIUM-ALBUTEROL 0.5-2.5 (3) MG/3ML IN SOLN
6.0000 mL | Freq: Once | RESPIRATORY_TRACT | Status: AC
  Administered 2021-04-07: 6 mL via RESPIRATORY_TRACT
  Filled 2021-04-07: qty 9

## 2021-04-07 MED ORDER — DOXYCYCLINE HYCLATE 100 MG PO CAPS: 100.0000 mg | ORAL_CAPSULE | Freq: Two times a day (BID) | ORAL | 0 refills | Status: DC

## 2021-04-07 MED ORDER — HYDROCODONE BIT-HOMATROP MBR 5-1.5 MG/5ML PO SOLN: 5.0000 mL | Freq: Three times a day (TID) | ORAL | 0 refills | Status: DC | PRN

## 2021-04-07 MED ORDER — PREDNISONE 20 MG PO TABS: 40.0000 mg | ORAL_TABLET | Freq: Every day | ORAL | 0 refills | Status: DC

## 2021-04-07 NOTE — ED Provider Notes (Signed)
?[facility] ?Provider Note ? ? ?CSN: [id number] ?Arrival date & time: 04/07/21  1636 ? ?  ? ?History ? ?Chief Complaint  ?Patient presents with  ? Shortness of Breath  ? ? ?Mary Blevins is a 76 y.o. female. ? ? ?Shortness of Breath ? ?This patient is a 76 year old female, history of COPD, has been seen by family doctor twice in the last month for COPD and review of the medical record shows that she was actually seen at her doctor's office on 10 March approximately 6 days ago during which time she was told to only take her albuterol treatments as needed, has been taking cough medication which has been helping and she is no longer feeling significant coughing though she still brings up occasional mucus.  Today she was noticing that she was having significant shortness of breath with exertion and had to stop and take a break because she was so short of breath just doing routine activities.  She decided to take a breathing treatment which she stated helped but when she called the office she was told to come to the hospital because of this increasing shortness of breath.  She states that normally she takes a breathing treatment every single day so she has gone 6 days without a breathing treatment leading up to this episode of dyspnea.  She denies swelling of the legs, denies fevers, denies sore throat, she has had a bit of postnasal drip.  There is no vomiting or diarrhea. ? ?Home Medications ?Prior to Admission medications   ?Medication Sig Start Date End Date Taking? Authorizing Provider  ?doxycycline (VIBRAMYCIN) 100 MG capsule Take 1 capsule (100 mg total) by mouth 2 (two) times daily. 04/07/21  Yes Noemi Chapel, MD  ?predniSONE (DELTASONE) 20 MG tablet Take 2 tablets (40 mg total) by mouth daily. 04/07/21  Yes Noemi Chapel, MD  ?albuterol (PROVENTIL) (2.5 MG/3ML) 0.083% nebulizer solution Take 3 mLs (2.5 mg total) by nebulization every 6 (six) hours as needed for wheezing or shortness of  breath. ?Patient taking differently: Take 2.5 mg by nebulization daily. 06/03/20   [name], MD  ?budesonide-formoterol (SYMBICORT) 160-4.5 MCG/ACT inhaler Inhale 2 puffs into the lungs 2 (two) times daily. 03/14/21   [name], MD  ?DULoxetine (CYMBALTA) 60 MG capsule  11/09/20   [provider]  ?fluticasone (FLONASE) 50 MCG/ACT nasal spray SPRAY 2 SPRAYS INTO EACH NOSTRIL EVERY DAY 07/11/18   [name], MD  ?gabapentin (NEURONTIN) 300 MG capsule Take 300 mg by mouth daily. 03/09/21   [provider]  ?HYDROcodone bit-homatropine (HYCODAN) 5-1.5 MG/5ML syrup Take 5 mLs by mouth every 8 (eight) hours as needed for cough. 04/07/21   [name], MD  ?HYDROcodone-acetaminophen (NORCO) 5-325 MG tablet Take 1 tablet by mouth every 6 (six) hours as needed for moderate pain. 12/15/20   Pete Pelt, PA-C  ?HYDROcodone-acetaminophen (NORCO) 7.5-325 MG tablet Take 1 tablet by mouth every 6 (six) hours as needed for moderate pain. 03/21/21   [name], MD  ?hydrOXYzine (ATARAX/VISTARIL) 25 MG tablet Take 1 tablet (25 mg total) by mouth 3 (three) times daily as needed for itching. 10/13/20   [name], MD  ?levothyroxine (SYNTHROID) 50 MCG tablet TAKE 1 TABLET BY MOUTH EVERY DAY BEFORE BREAKFAST 01/18/21   [name], MD  ?Propylene Glycol-Glycerin 0.6-0.6 % SOLN Place 1 drop into both eyes 2 (two) times daily. Artifical tears    [provider]  ?rosuvastatin (  CRESTOR) 5 MG tablet Take 1 tablet (5 mg total) by mouth daily. 09/17/20   Belva Crome, MD  ?sodium chloride (OCEAN) 0.65 % SOLN nasal spray Place 1 spray into both nostrils daily.    [provider]  ?Tiotropium Bromide Monohydrate (SPIRIVA RESPIMAT) 2.5 MCG/ACT AERS Inhale 2 puffs into the lungs daily. 02/25/21   Tanda Rockers, MD  ?tiZANidine (ZANAFLEX) 4 MG tablet TAKE 1 TABLET BY MOUTH EVERY 6 HOURS AS NEEDED FOR MUSCLE SPASMS. 04/04/21   [name], MD  ?verapamil  (CALAN-SR) 120 MG CR tablet TAKE 1 TABLET EVERY DAY 11/23/20   [name], MD  ?   ? ?Allergies    ?Oxycodone and Meloxicam   ? ?Review of Systems   ?Review of Systems  ?Respiratory:  Positive for shortness of breath.   ?All other systems reviewed and are negative. ? ?Physical Exam ?Updated Vital Signs ?BP 123/78   Pulse 91   Temp 98.2 ?F (36.8 ?C)   Resp 20   Ht 1.664 m (5' 5.5")   Wt 75.8 kg   SpO2 100%   BMI 27.37 kg/m?  ?Physical Exam ?Vitals and nursing note reviewed.  ?Constitutional:   ?   General: She is not in acute distress. ?   Appearance: She is well-developed.  ?HENT:  ?   Head: Normocephalic and atraumatic.  ?   Mouth/Throat:  ?   Pharynx: No oropharyngeal exudate.  ?Eyes:  ?   General: No scleral icterus.    ?   Right eye: No discharge.     ?   Left eye: No discharge.  ?   Conjunctiva/sclera: Conjunctivae normal.  ?   Pupils: Pupils are equal, round, and reactive to light.  ?Neck:  ?   Thyroid: No thyromegaly.  ?   Vascular: No JVD.  ?Cardiovascular:  ?   Rate and Rhythm: Normal rate and regular rhythm.  ?   Heart sounds: Normal heart sounds. No murmur heard. ?  No friction rub. No gallop.  ?Pulmonary:  ?   Effort: No respiratory distress.  ?   Breath sounds: Wheezing present. No rales.  ?   Comments: Diminished lung sounds in all lung fields, speaks in full sentences, no tachypnea or accessory muscle use on my exam.  Oxygen is 98%, she does have diminished lung sounds in all lung fields ?Abdominal:  ?   General: Bowel sounds are normal. There is no distension.  ?   Palpations: Abdomen is soft. There is no mass.  ?   Tenderness: There is no abdominal tenderness.  ?Musculoskeletal:     ?   General: No tenderness. Normal range of motion.  ?   Cervical back: Normal range of motion and neck supple.  ?   Right lower leg: No edema.  ?   Left lower leg: No edema.  ?Lymphadenopathy:  ?   Cervical: No cervical adenopathy.  ?Skin: ?   General: Skin is warm and dry.  ?   Findings: No erythema or  rash.  ?Neurological:  ?   General: No focal deficit present.  ?   Mental Status: She is alert.  ?   Coordination: Coordination normal.  ?Psychiatric:     ?   Behavior: Behavior normal.  ? ? ?ED Results / Procedures / Treatments   ?Labs ?(all labs ordered are listed, but only abnormal results are displayed) ?Labs Reviewed  ?CBC - Abnormal; Notable for the following components:  ?    Result Value  ?  WBC 12.6 (*)   ? All other components within normal limits  ?BASIC METABOLIC PANEL - Abnormal; Notable for the following components:  ? Glucose, Bld 103 (*)   ? Calcium 8.8 (*)   ? All other components within normal limits  ? ? ?EKG ?None ? ?Radiology ?DG Chest 2 View ? ?Result Date: 04/07/2021 ?CLINICAL DATA:  Shortness of breath and cough.  History of COPD. EXAM: CHEST - 2 VIEW COMPARISON:  03/18/2021 FINDINGS: Heart size is normal. Chronic aortic atherosclerosis and tortuosity. Bronchial thickening suggesting bronchitis. No consolidation, collapse or pleural effusion. No acute bone finding. IMPRESSION: Possible bronchitis.  No consolidation or collapse. Electronically Signed   By: Nelson Chimes M.D.   On: 04/07/2021 17:21   ? ?Procedures ?Procedures  ? ? ?Medications Ordered in ED ?Medications  ?ipratropium-albuterol (DUONEB) 0.5-2.5 (3) MG/3ML nebulizer solution 6 mL (6 mLs Nebulization Given 04/07/21 1723)  ?predniSONE (DELTASONE) tablet 40 mg (40 mg Oral Given 04/07/21 1719)  ? ? ?ED Course/ Medical Decision Making/ A&P ?  ?                        ?Medical Decision Making ?Amount and/or Complexity of Data Reviewed ?Labs: ordered. ?Radiology: ordered. ?ECG/medicine tests: ordered. ? ?Risk ?Prescription drug management. ? ? ?This patient presents to the ED for concern of shortness of breath, this involves an extensive number of treatment options, and is a complaint that carries with it a high risk of complications and morbidity.  The differential diagnosis includes COPD exacerbation, pneumonia, pneumothorax, cardiac  disease although COPD exacerbation seems more likely since she has been off of her bronchodilators and they helped her after she took it today. ? ? ?Co morbidities that complicate the patient evaluation ? ?COPD on 2 L b

## 2021-04-07 NOTE — Telephone Encounter (Signed)
Patient called to ask for refill on Hycodan cough syrup. She states she is still coughing some and this does help. ? ?Please advise, thanks! ?

## 2021-04-07 NOTE — ED Triage Notes (Signed)
Shortness of breath, worse with exertion ?

## 2021-04-07 NOTE — Discharge Instructions (Addendum)
Please take the albuterol inhaler every 4 hours for the next 24 hours then every 4 hours as needed.  I would like for you to return to the emergency department for severe worsening symptoms, see your doctor in 3 days for recheck.  Prednisone daily for 5 days ?

## 2021-04-08 ENCOUNTER — Telehealth: Payer: Self-pay | Admitting: Family Medicine

## 2021-04-08 NOTE — Telephone Encounter (Signed)
Called patient to schedule ED follow up appointment. Mailbox for cell is full; no answer or voicemail for land line.  ?

## 2021-04-08 NOTE — Telephone Encounter (Signed)
Patient left voicemail message last night to follow up on ED visit. Patient states she was advised to see pcp in 3 days. Prescribed albuterol inhaler q 4 hrs for next 24 hrs, then q 4 hrs as needed. Called to make pcp aware and wants his opinion.  ?Please advise at [phone], or [phone] ?

## 2021-04-11 ENCOUNTER — Ambulatory Visit (INDEPENDENT_AMBULATORY_CARE_PROVIDER_SITE_OTHER): Payer: Medicare HMO | Admitting: Family Medicine

## 2021-04-11 ENCOUNTER — Other Ambulatory Visit: Payer: Self-pay

## 2021-04-11 ENCOUNTER — Encounter: Payer: Self-pay | Admitting: Family Medicine

## 2021-04-11 VITALS — BP 139/90 | HR 93 | Temp 97.3°F | Ht 65.0 in | Wt 159.3 lb

## 2021-04-11 DIAGNOSIS — J441 Chronic obstructive pulmonary disease with (acute) exacerbation: Secondary | ICD-10-CM

## 2021-04-11 MED ORDER — MONTELUKAST SODIUM 10 MG PO TABS: 10.0000 mg | ORAL_TABLET | Freq: Every day | ORAL | 3 refills | Status: DC

## 2021-04-11 NOTE — Progress Notes (Signed)
? ?Subjective:  ? ? Patient ID: Mary Blevins, female    DOB: 08-27-1945, 76 y.o.   MRN: [id number] ? ?Shortness of Breath ? ?Cough ?Associated symptoms include shortness of breath.  ?Patient has been to the emergency room 4 times in the last 2 months with "COPD exacerbation".  The patient does not smoke.  She is on triple therapy.  She denies any pets.  She denies any exposure to mold although she states that there is water in her crawlspace.  She does report having some allergies and rhinorrhea due to allergies this time a year.  She is compliant using her Symbicort and her Spiriva.  Today she has diminished breath sounds bilaterally but there is no expiratory wheezes.  There are no rhonchi or rails.  There is no respiratory distress.  She has normal unlabored breathing.  She is not tachypneic.  She denies any chest pain. ?Past Medical History:  ?Diagnosis Date  ? Arthritis   ? COPD (chronic obstructive pulmonary disease) ([place])   ? Dyspnea   ? Elevated lipids   ? Generalized headaches   ? History of pneumonia   ? Hyperlipidemia   ? Hypertension   ? Hypothyroidism   ? MVP (mitral valve prolapse)   ? palpitations  ? Osteopenia   ? Seasonal allergies   ? Sinusitis   ? Tubular adenoma of colon   ? ?Past Surgical History:  ?Procedure Laterality Date  ? CATARACT EXTRACTION Left   ? COLONOSCOPY WITH PROPOFOL N/A 01/06/2016  ? Procedure: COLONOSCOPY WITH PROPOFOL;  Surgeon: Doran Stabler, MD;  Location: [facility];  Service: Gastroenterology;  Laterality: N/A;  ? DILATION AND CURETTAGE OF UTERUS    ? THYROIDECTOMY    ? TONSILLECTOMY    ? 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  ? Procedure: RIGHT TOTAL HIP ARTHROPLASTY ANTERIOR APPROACH;  Surgeon: Mcarthur Rossetti, MD;  Location: [facility];  Service: Orthopedics;  Laterality: Right;  Needs RNFA  ? ?Current Outpatient Medications on File Prior to Visit  ?Medication Sig Dispense Refill  ? albuterol (PROVENTIL) (2.5 MG/3ML) 0.083% nebulizer solution Take 3 mLs  (2.5 mg total) by nebulization every 6 (six) hours as needed for wheezing or shortness of breath. (Patient taking differently: Take 2.5 mg by nebulization daily.) 360 mL 3  ? budesonide-formoterol (SYMBICORT) 160-4.5 MCG/ACT inhaler Inhale 2 puffs into the lungs 2 (two) times daily. 1 each 5  ? doxycycline (VIBRAMYCIN) 100 MG capsule Take 1 capsule (100 mg total) by mouth 2 (two) times daily. 14 capsule 0  ? DULoxetine (CYMBALTA) 60 MG capsule     ? fluticasone (FLONASE) 50 MCG/ACT nasal spray SPRAY 2 SPRAYS INTO EACH NOSTRIL EVERY DAY 16 g 2  ? gabapentin (NEURONTIN) 300 MG capsule Take 300 mg by mouth daily.    ? HYDROcodone bit-homatropine (HYCODAN) 5-1.5 MG/5ML syrup Take 5 mLs by mouth every 8 (eight) hours as needed for cough. 120 mL 0  ? HYDROcodone-acetaminophen (NORCO) 5-325 MG tablet Take 1 tablet by mouth every 6 (six) hours as needed for moderate pain. 30 tablet 0  ? HYDROcodone-acetaminophen (NORCO) 7.5-325 MG tablet Take 1 tablet by mouth every 6 (six) hours as needed for moderate pain. 30 tablet 0  ? hydrOXYzine (ATARAX/VISTARIL) 25 MG tablet Take 1 tablet (25 mg total) by mouth 3 (three) times daily as needed for itching. 30 tablet 6  ? levothyroxine (SYNTHROID) 50 MCG tablet TAKE 1 TABLET BY MOUTH EVERY DAY BEFORE BREAKFAST 90 tablet 3  ? predniSONE (DELTASONE)  20 MG tablet Take 2 tablets (40 mg total) by mouth daily. 10 tablet 0  ? Propylene Glycol-Glycerin 0.6-0.6 % SOLN Place 1 drop into both eyes 2 (two) times daily. Artifical tears    ? rosuvastatin (CRESTOR) 5 MG tablet Take 1 tablet (5 mg total) by mouth daily. 90 tablet 3  ? sodium chloride (OCEAN) 0.65 % SOLN nasal spray Place 1 spray into both nostrils daily.    ? Tiotropium Bromide Monohydrate (SPIRIVA RESPIMAT) 2.5 MCG/ACT AERS Inhale 2 puffs into the lungs daily. 4 g 0  ? tiZANidine (ZANAFLEX) 4 MG tablet TAKE 1 TABLET BY MOUTH EVERY 6 HOURS AS NEEDED FOR MUSCLE SPASMS. 30 tablet 0  ? verapamil (CALAN-SR) 120 MG CR tablet TAKE 1 TABLET  EVERY DAY 90 tablet 3  ? ?No current facility-administered medications on file prior to visit.  ? ?Allergies  ?Allergen Reactions  ? Oxycodone Other (See Comments)  ?  Hallucinations ?  ? Meloxicam Other (See Comments)  ?  BAD DREAMS, SAD THOUGHTS  ? ?Social History  ? ?Socioeconomic History  ? Marital status: Widowed  ?  Spouse name: Not on file  ? Number of children: 0  ? Years of education: 63 B  ? Highest education level: Not on file  ?Occupational History  ?  Employer: [employer]  ?Tobacco Use  ? Smoking status: Former  ?  Types: Cigarettes  ?  Quit date: 07/24/2005  ?  Years since quitting: 15.7  ? Smokeless tobacco: Never  ?Vaping Use  ? Vaping Use: Never used  ?Substance and Sexual Activity  ? Alcohol use: Yes  ?  Comment: rare  ? Drug use: No  ? Sexual activity: Not on file  ?Other Topics Concern  ? Not on file  ?Social History Narrative  ? Lives alone  ? ?Social Determinants of Health  ? ?Financial Resource Strain: Low Risk   ? Difficulty of Paying Living Expenses: Not hard at all  ?Food Insecurity: No Food Insecurity  ? Worried About Charity fundraiser in the Last Year: Never true  ? Ran Out of Food in the Last Year: Never true  ?Transportation Needs: No Transportation Needs  ? Lack of Transportation (Medical): No  ? Lack of Transportation (Non-Medical): No  ?Physical Activity: Inactive  ? Days of Exercise per Week: 0 days  ? Minutes of Exercise per Session: 0 min  ?Stress: No Stress Concern Present  ? Feeling of Stress : Only a little  ?Social Connections: Moderately Isolated  ? Frequency of Communication with Friends and Family: More than three times a week  ? Frequency of Social Gatherings with Friends and Family: More than three times a week  ? Attends Religious Services: More than 4 times per year  ? Active Member of Clubs or Organizations: No  ? Attends Archivist Meetings: Never  ? Marital Status: Widowed  ?Intimate Partner Violence: Not At Risk  ? Fear of Current or  Ex-Partner: No  ? Emotionally Abused: No  ? Physically Abused: No  ? Sexually Abused: No  ? ? ? ?Review of Systems  ?Respiratory:  Positive for cough and shortness of breath.   ?All other systems reviewed and are negative. ? ?   ?Objective:  ? Physical Exam ?Vitals reviewed.  ?Constitutional:   ?   General: She is not in acute distress. ?   Appearance: She is well-developed and normal weight. She is not ill-appearing.  ?Cardiovascular:  ?   Rate and Rhythm: Normal  rate and regular rhythm.  ?   Pulses: Normal pulses.  ?   Heart sounds: Normal heart sounds. No murmur heard. ?  No friction rub. No gallop.  ?Pulmonary:  ?   Effort: Pulmonary effort is normal. No tachypnea, accessory muscle usage, prolonged expiration or respiratory distress.  ?   Breath sounds: Normal breath sounds and air entry. No stridor, decreased air movement or transmitted upper airway sounds. No decreased breath sounds, wheezing, rhonchi or rales.  ?Musculoskeletal:  ?   Right lower leg: No edema.  ?   Left lower leg: No edema.  ?Neurological:  ?   Mental Status: She is alert.  ? ? ? ? ? ?   ?Assessment & Plan:  ? ?COPD with acute exacerbation ([place]) ?Patient is back to her baseline today.  My biggest concern is why she has so many frequent attacks.  She is already on maximum therapy with Symbicort and Spiriva.  She denies any potential triggers other than allergies.  I did recommend a home inspection to see if there may be mold in her crawlspace.  Meanwhile I will start the patient on Singulair in case there is a component of asthma in her disease that could be reacting to environmental allergies.  Hopefully the Singulair will help decrease the frequency of the exacerbations.  Also recommended following up with pulmonology to see if they have any other insights that may help.  She denies any acid reflux. ? ? ? ? ? ? ? ? ? ? ? ? ? ? ? ? ? ? ? ? ? ? ? ? ? ? ? ? ? ? ?

## 2021-04-13 ENCOUNTER — Telehealth: Payer: Self-pay | Admitting: Family Medicine

## 2021-04-13 NOTE — Telephone Encounter (Signed)
Erroneous encounter. Please disregard.

## 2021-04-13 NOTE — Telephone Encounter (Signed)
Patient left voice message to request call back from nurse; wants to know if provider will prescribe prevagen for her memory. Outbound call placed to advise patient to schedule an appointment with provider to discuss. ? ? ?

## 2021-04-15 NOTE — Telephone Encounter (Signed)
Patient advised this is an OTC supplement. She can take if she would like to try it. Please let us know how she is doing if she decides to start this, and we will need to add to her med list. Nothing further needed at this time.  ? ?

## 2021-04-18 ENCOUNTER — Other Ambulatory Visit: Payer: Self-pay | Admitting: Family Medicine

## 2021-04-21 ENCOUNTER — Telehealth: Payer: Self-pay

## 2021-04-21 NOTE — Telephone Encounter (Signed)
Pt called in wanting to let pcp know that pt started taking a woman's multivitamin. Pt just wanted to know if this is ok to take? ?Please advise. ? ?Cb#: [id number] ?

## 2021-04-22 NOTE — Telephone Encounter (Signed)
LM for pt

## 2021-04-24 NOTE — Progress Notes (Deleted)
?Cardiology Office Note:   ? ?Date:  04/24/2021  ? ?ID:  Mary Blevins, DOB 10-14-1945, MRN [id number] ? ?PCP:  [name], MD  ?Cardiologist:  Sinclair Grooms, MD  ? ?Referring MD: [name], MD  ? ?No chief complaint on file. ? ? ?History of Present Illness:   ? ?Mary Blevins is a 76 y.o. female with a hx of PSVT, COPD, elevated lipids, and remote history of mitral valve prolapse. ? ?*** ? ?Past Medical History:  ?Diagnosis Date  ? Arthritis   ? COPD (chronic obstructive pulmonary disease) ([place])   ? Dyspnea   ? Elevated lipids   ? Generalized headaches   ? History of pneumonia   ? Hyperlipidemia   ? Hypertension   ? Hypothyroidism   ? MVP (mitral valve prolapse)   ? palpitations  ? Osteopenia   ? Seasonal allergies   ? Sinusitis   ? Tubular adenoma of colon   ? ? ?Past Surgical History:  ?Procedure Laterality Date  ? CATARACT EXTRACTION Left   ? COLONOSCOPY WITH PROPOFOL N/A 01/06/2016  ? Procedure: COLONOSCOPY WITH PROPOFOL;  Surgeon: Doran Stabler, MD;  Location: [facility];  Service: Gastroenterology;  Laterality: N/A;  ? DILATION AND CURETTAGE OF UTERUS    ? THYROIDECTOMY    ? TONSILLECTOMY    ? 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  ? Procedure: RIGHT TOTAL HIP ARTHROPLASTY ANTERIOR APPROACH;  Surgeon: Mcarthur Rossetti, MD;  Location: [facility];  Service: Orthopedics;  Laterality: Right;  Needs RNFA  ? ? ?Current Medications: ?No outpatient medications have been marked as taking for the 04/25/21 encounter (Appointment) with Belva Crome, MD.  ?  ? ?Allergies:   Oxycodone and Meloxicam  ? ?Social History  ? ?Socioeconomic History  ? Marital status: Widowed  ?  Spouse name: Not on file  ? Number of children: 0  ? Years of education: 56 B  ? Highest education level: Not on file  ?Occupational History  ?  Employer: [employer]  ?Tobacco Use  ? Smoking status: Former  ?  Types: Cigarettes  ?  Quit date: 07/24/2005  ?  Years since quitting: 15.7  ? Smokeless  tobacco: Never  ?Vaping Use  ? Vaping Use: Never used  ?Substance and Sexual Activity  ? Alcohol use: Yes  ?  Comment: rare  ? Drug use: No  ? Sexual activity: Not on file  ?Other Topics Concern  ? Not on file  ?Social History Narrative  ? Lives alone  ? ?Social Determinants of Health  ? ?Financial Resource Strain: Low Risk   ? Difficulty of Paying Living Expenses: Not hard at all  ?Food Insecurity: No Food Insecurity  ? Worried About Charity fundraiser in the Last Year: Never true  ? Ran Out of Food in the Last Year: Never true  ?Transportation Needs: No Transportation Needs  ? Lack of Transportation (Medical): No  ? Lack of Transportation (Non-Medical): No  ?Physical Activity: Inactive  ? Days of Exercise per Week: 0 days  ? Minutes of Exercise per Session: 0 min  ?Stress: No Stress Concern Present  ? Feeling of Stress : Only a little  ?Social Connections: Moderately Isolated  ? Frequency of Communication with Friends and Family: More than three times a week  ? Frequency of Social Gatherings with Friends and Family: More than three times a week  ? Attends Religious Services: More than 4 times per year  ? Active  Member of Clubs or Organizations: No  ? Attends Archivist Meetings: Never  ? Marital Status: Widowed  ?  ? ?Family History: ?The patient's family history includes Breast cancer in her maternal aunt; Dementia in her mother; Diabetes in her brother; Healthy in her father and sister; Heart attack in her maternal grandmother, mother, and another family member; Heart disease in her mother; Prostate cancer in her maternal grandfather. There is no history of Colon cancer, Esophageal cancer, Rectal cancer, or Stomach cancer. ? ?ROS:   ?Please see the history of present illness.    ?*** All other systems reviewed and are negative. ? ?EKGs/Labs/Other Studies Reviewed:   ? ?The following studies were reviewed today: ?*** ? ?EKG:  EKG *** ? ?Recent Labs: ?08/04/2020: ALT 12; TSH 2.65 ?04/07/2021: BUN 12;  Creatinine, Ser 0.84; Hemoglobin 14.0; Platelets 279; Potassium 4.3; Sodium 139  ?Recent Lipid Panel ?   ?Component Value Date/Time  ? CHOL 144 08/04/2020 1230  ? CHOL 145 07/30/2017 0804  ? TRIG 136 08/04/2020 1230  ? HDL 49 (L) 08/04/2020 1230  ? HDL 59 07/30/2017 0804  ? CHOLHDL 2.9 08/04/2020 1230  ? VLDL 44 (H) 12/14/2014 1046  ? [place] 73 08/04/2020 1230  ? ? ?Physical Exam:   ? ?VS:  There were no vitals taken for this visit.   ? ?Wt Readings from Last 3 Encounters:  ?04/11/21 159 lb 4.8 oz (72.3 kg)  ?04/07/21 167 lb (75.8 kg)  ?04/01/21 164 lb (74.4 kg)  ?  ? ?GEN: ***. No acute distress ?HEENT: Normal ?NECK: No JVD. ?LYMPHATICS: No lymphadenopathy ?CARDIAC: *** murmur. RRR *** gallop, or edema. ?VASCULAR: *** Normal Pulses. No bruits. ?RESPIRATORY:  Clear to auscultation without rales, wheezing or rhonchi  ?ABDOMEN: Soft, non-tender, non-distended, No pulsatile mass, ?MUSCULOSKELETAL: No deformity  ?SKIN: Warm and dry ?NEUROLOGIC:  Alert and oriented x 3 ?PSYCHIATRIC:  Normal affect  ? ?ASSESSMENT:   ? ?1. PSVT (paroxysmal supraventricular tachycardia) ([place])   ?2. Aortic atherosclerosis ([place])   ?3. Chronic bronchitis, unspecified chronic bronchitis type ([place])   ? ?PLAN:   ? ?In order of problems listed above: ? ?*** ? ? ?Medication Adjustments/Labs and Tests Ordered: ?Current medicines are reviewed at length with the patient today.  Concerns regarding medicines are outlined above.  ?No orders of the defined types were placed in this encounter. ? ?No orders of the defined types were placed in this encounter. ? ? ?There are no Patient Instructions on file for this visit.  ? ?Signed, ?Sinclair Grooms, MD  ?04/24/2021 4:07 PM    ?[place] ?

## 2021-04-25 ENCOUNTER — Ambulatory Visit: Payer: Medicare HMO | Admitting: Interventional Cardiology

## 2021-04-25 DIAGNOSIS — J42 Unspecified chronic bronchitis: Secondary | ICD-10-CM

## 2021-04-25 DIAGNOSIS — I700 Atherosclerosis of aorta: Secondary | ICD-10-CM

## 2021-04-25 DIAGNOSIS — I471 Supraventricular tachycardia: Secondary | ICD-10-CM

## 2021-04-25 NOTE — Progress Notes (Deleted)
? ?Chronic Care Management ?Pharmacy Note ? ?04/25/2021 ?Name:  Mary Blevins MRN:  [id number] DOB:  28-Jul-1945 ? ?Summary: ?Initial visit with PharmD.  Patient mainly needs assistance with copay help.  All meds reviewed and list updated. ? ?Recommendations/Changes made from today's visit: ?Patient assistance started for Symbicort and Spiriva. ? ?Plan: ?Fu 6 months ? ? ?Subjective: ?Mary Blevins is an 76 y.o. year old female who is a primary patient of Pickard, Cammie Mcgee, MD.  The CCM team was consulted for assistance with disease management and care coordination needs.   ? ?Engaged with patient face to face for follow up visit in response to provider referral for pharmacy case management and/or care coordination services.  ? ?Consent to Services:  ?The patient was given the following information about Chronic Care Management services today, agreed to services, and gave verbal consent: 1. CCM service includes personalized support from designated clinical staff supervised by the primary care provider, including individualized plan of care and coordination with other care providers 2. 24/7 contact phone numbers for assistance for urgent and routine care needs. 3. Service will only be billed when office clinical staff spend 20 minutes or more in a month to coordinate care. 4. Only one practitioner may furnish and bill the service in a calendar month. 5.The patient may stop CCM services at any time (effective at the end of the month) by phone call to the office staff. 6. The patient will be responsible for cost sharing (co-pay) of up to 20% of the service fee (after annual deductible is met). Patient agreed to services and consent obtained. ? ?Patient Care Team: ?[name], MD as PCP - General (Family Medicine) ?Belva Crome, MD as PCP - Cardiology (Cardiology) ?Edythe Clarity, [facility] as Pharmacist (Pharmacist) ? ?Recent office visits:  ?09/02/20 Jannette Spanner, P - Family Medicine - Bronchitis -  I do not feel she is in exacerbation today and I do not think she needs steroids or antibiotics.  I encouraged her to use daily maintenance inhaler Judithann Sauger) to prevent exacerbations.  Continue collaboration with Pulmonary.  Return to clinic if symptoms worsen. ?  ?08/04/20 Jannette Spanner, Mamie Nick - Family Medicine - Hypertension - Labs were ordered. predniSONE (DELTASONE) 10 MG tablet Take 6 tablets (60 mg total) by mouth daily with breakfast for 1 day, THEN 5 tablets (50 mg total) daily with breakfast for 1 day, THEN 4 tablets (40 mg total) daily with breakfast for 1 day, THEN 3 tablets (30 mg total) daily with breakfast for 1 day, THEN 2 tablets (20 mg total) daily with breakfast for 1 day, THEN 1 tablet (10 mg total) daily with breakfast for 1 day prescribed. Return for labs. ?  ?  ?Recent consult visits:  ?12/15/20 [place] Total replacement of right hip - Hydrocodone 5/325 mg prescribed. Work on range of motion strength exercises.  Follow-up with [place] in 6 months we will obtain an AP pelvis and lateral view of the right hip at that time ?  ?11/18/20 [place] Total replacement of right hip - We will see her back in 4 weeks from now to see how she is doing overall.  At that visit we will end up probably ordering repeat Dopplers of her lower extremities on both sides to assess if there is resolution of her DVT ?  ?10/21/20 - [place] Total replacement of right hip -  stop her baby aspirin  at this point since she was normal before surgery. We will see her back in 4 weeks to see how she is doing overall but no x-rays are needed. ?  ?10/18/20 - Frankey Shown, MD - Orthopedics - sulfamethoxazole-trimethoprim (BACTRIM DS) 800-160 MG tablet Take 1 tablet by mouth 2 (two) times daily prescribed. 10 days of Bactrim since the incision has 1 little area that is macerated prescribed,. Follow up in 2 weeks. ?  ?10/04/20 [place] Total replacement of right hip - The right hip incision looks good.  There is no evidence of infection at all.  I was able to aspirate another 60 cc of seroma from the soft tissue. Follow up in 4 days. ?  ?10/01/20 Gloriann Loan, MD - Orthopedics - Post Total replacement of right hip - XR of right hip ordered,. [place] joint injection performed. Follow up as scheduled on Monday with  ?Dr Ninfa [place]. ?  ?08/05/20 Erskine Emery, MD - Orthopedics - Osteoarthritis - Hip replacement options discussed and surgery was scheduled. Follow up post surgery.  ?  ?07/07/20 Erskine Emery, PA-C - Pain of right hip - MRI ordered of right hip.  Refill on hydrocodone was placed. Follow up in 5 days to discuss results.  ?  ?06/30/20 Vineet Stood, MD - Pulmonology - Centrilobular emphysema - Budeson-Glycopyrrol-Formoterol (BREZTRI AEROSPHERE) 160-9-4.8 MCG/ACT AERO Inhale 2 puffs into the lungs in the morning and at bedtime prescribed. Follow up in 1 year.  ?  ?  ?Hospital visits: 10/03/20 ?  ?Medication Reconciliation was completed by comparing discharge summary, patient?s EMR and Pharmacy list, and upon discussion with patient. ?  ?Admitted to the hospital on 10/03/20 due to Leg pain post total right hip replacement. Discharge date was 10/03/20. Discharged from [facility].   ?  ?New?Medications Started at [facility] Discharge:?? ?started Eliquis due to DVT  ?  ?Medication Changes at Hospital Discharge: ?No other changes noted ?  ?Medications Discontinued at Hospital Discharge: ?No medications were discontinued at discharge. ?  ?Medications that remain the same after Hospital Discharge:??  ?All other medications will remain the same.   ?  ?Hospital visits: 09/24/20 ?  ?Medication Reconciliation was completed by comparing discharge summary, patient?s EMR and Pharmacy list, and upon discussion with patient. ?  ?Admitted to the hospital on 09/24/20 due to Leg pain post total right hip replacement. Discharge date was  09/27/20. Discharged from [facility].   ?  ?New?Medications Started at [facility] Discharge:?? ?started Oxycodone for pain Started daily Aspirin. ?  ?Medication Changes at Hospital Discharge: ?No other changes noted ?  ?Medications Discontinued at Hospital Discharge: ?Hydrocodone and Diclofenac were discontinue. ?  ?Medications that remain the same after Hospital Discharge:??  ?All other medications will remain the same ?  ?  ?Hospital visits: 08/31/20 ?  ?Medication Reconciliation was completed by comparing discharge summary, patient?s EMR and Pharmacy list, and upon discussion with patient. ?  ?Admitted to the hospital on 08/31/20 due to Leg pain post total right hip replacement. Discharge date was 09/01/20. Discharged from [facility].   ?  ?New?Medications Started at [facility] Discharge:?? ?None noted. ?  ?Medication Changes at Hospital Discharge: ?No other changes noted ?  ?Medications Discontinued at Hospital Discharge: ?No medication discharged noted. ?  ?Medications that remain the same after Hospital Discharge:??  ?All other medications will remain the same ? ? ?Objective: ? ?Lab Results  ?Component Value Date  ? CREATININE 0.84 04/07/2021  ? BUN 12 04/07/2021  ?  GFRNONAA >60 04/07/2021  ? GFRAA 92 03/12/2019  ? NA 139 04/07/2021  ? K 4.3 04/07/2021  ? CALCIUM 8.8 (L) 04/07/2021  ? CO2 25 04/07/2021  ? GLUCOSE 103 (H) 04/07/2021  ? ? ?Lab Results  ?Component Value Date/Time  ? HGBA1C 5.6 08/04/2020 12:30 PM  ?  ?Last diabetic Eye exam: No results found for: HMDIABEYEEXA  ?Last diabetic Foot exam: No results found for: HMDIABFOOTEX  ? ?Lab Results  ?Component Value Date  ? CHOL 144 08/04/2020  ? HDL 49 (L) 08/04/2020  ? [place] 73 08/04/2020  ? TRIG 136 08/04/2020  ? CHOLHDL 2.9 08/04/2020  ? ? ? ?  Latest Ref Rng & Units 08/04/2020  ? 12:30 PM 01/07/2020  ? 10:45 AM 03/12/2019  ?  8:37 AM  ?Hepatic Function  ?Total Protein 6.1 - 8.1 g/dL 6.6   6.3   6.5    ?AST 10 - 35 U/L _0 ?ALT 6 - 29  U/L _1 ?Total Bilirubin 0.2 - 1.2 mg/dL 0.6   0.5   0.4    ? ? ?Lab Results  ?Component Value Date/Time  ? TSH 2.65 08/04/2020 12:30 PM  ? TSH 3.53 03/12/2019 08:37 AM  ? ? ? ?  Latest Ref Rng & Uni

## 2021-04-28 ENCOUNTER — Telehealth: Payer: Medicare HMO

## 2021-04-28 NOTE — Telephone Encounter (Signed)
LM 2nd 04/28/21 ?

## 2021-05-02 ENCOUNTER — Telehealth: Payer: Self-pay

## 2021-05-02 ENCOUNTER — Other Ambulatory Visit: Payer: Self-pay | Admitting: Family Medicine

## 2021-05-02 MED ORDER — HYDROCODONE-ACETAMINOPHEN 5-325 MG PO TABS: 1.0000 | ORAL_TABLET | Freq: Four times a day (QID) | ORAL | 0 refills | Status: DC | PRN

## 2021-05-02 NOTE — Telephone Encounter (Signed)
LOV 04/11/21 ?Last refill 03/21/21, #30, 0 refills ? ?Please review, thanks! ? ? ?

## 2021-05-03 NOTE — Telephone Encounter (Signed)
Rx refill per Dr. Dennard Schaumann ?

## 2021-05-16 ENCOUNTER — Telehealth: Payer: Self-pay

## 2021-05-16 NOTE — Telephone Encounter (Signed)
Pt called in stating that she would like to know if dr would prescribe a rescue inhaler  for her. Please advise. ? ?Cb#: [phone] ?

## 2021-05-17 ENCOUNTER — Telehealth: Payer: Self-pay | Admitting: Pulmonary Disease

## 2021-05-17 NOTE — Telephone Encounter (Signed)
Patient will need to contact Dr. Gustavus Bryant office for management of COPD medications. ? ?[place] to advise.  ?

## 2021-05-17 NOTE — Telephone Encounter (Signed)
Pt returned call and I advised her to call Dr. Gustavus Bryant office in regards to COPD medication. Patient verbalized understanding.  ?

## 2021-05-18 MED ORDER — ALBUTEROL SULFATE HFA 108 (90 BASE) MCG/ACT IN AERS: 2.0000 | INHALATION_SPRAY | Freq: Four times a day (QID) | RESPIRATORY_TRACT | 6 refills | Status: DC | PRN

## 2021-05-18 NOTE — Telephone Encounter (Signed)
Order placed. Patient notified. Nothing further needed.  ?

## 2021-05-18 NOTE — Telephone Encounter (Signed)
Patient returned  call and states that she was using her cousins rescue inhaler as needed and now can not get any more inhalers from her cousin. Would like to know if she can have a prescription for a rescue inhaler to use as needed. Dr. Halford Chessman please advise  ?

## 2021-05-18 NOTE — Telephone Encounter (Signed)
ATC patient.  LMTCB. 

## 2021-05-18 NOTE — Telephone Encounter (Signed)
Okay to send script for albuterol hfa 2 puffs q6h prn. ?

## 2021-05-24 ENCOUNTER — Other Ambulatory Visit: Payer: Self-pay

## 2021-05-24 NOTE — Telephone Encounter (Signed)
Pt called in requesting a refill of HYDROcodone-acetaminophen (NORCO) 5-325 MG sent to [facility] [address] in [place] please. Please advise. ? ?Cb#: [phone] ? ? ?

## 2021-05-25 NOTE — Telephone Encounter (Signed)
Requested medication (s) are due for refill today - yes ? ?Requested medication (s) are on the active medication list -yes ? ?Future visit scheduled -no ? ?Last refill: 05/02/21 #30 ? ?Notes to clinic: Request RF: non delegated Rx ? ?Requested Prescriptions  ?Pending Prescriptions Disp Refills  ? HYDROcodone-acetaminophen (NORCO) 5-325 MG tablet 30 tablet 0  ?  Sig: Take 1 tablet by mouth every 6 (six) hours as needed for moderate pain.  ?  ? Not Delegated - Analgesics:  Opioid Agonist Combinations Failed - 05/24/2021  2:55 PM  ?  ?  Failed - This refill cannot be delegated  ?  ?  Failed - Urine Drug Screen completed in last 360 days  ?  ?  Passed - Valid encounter within last 3 months  ?  Recent Outpatient Visits   ? ?      ? 1 month ago COPD with acute exacerbation ([place])  ? [facility] Pickard, Cammie Mcgee, MD  ? 1 month ago COPD with acute exacerbation [facility])  ? [facility] Pickard, Cammie Mcgee, MD  ? 2 months ago COPD with acute exacerbation [facility])  ? [facility] Pickard, Cammie Mcgee, MD  ? 2 months ago Acute pain of left knee  ? [facility] Pickard, Cammie Mcgee, MD  ? 8 months ago Mixed simple and mucopurulent chronic bronchitis ([place])  ? [place] Eulogio Bear, NP  ? ?  ?  ?Future Appointments   ? ?        ? In 2 weeks Mcarthur Rossetti, MD [facility]  ? In 1 month Chesley Mires, MD [place] [facility]  ? ?  ? ? ?  ?  ?  ? ? ? ?Requested Prescriptions  ?Pending Prescriptions Disp Refills  ? HYDROcodone-acetaminophen (NORCO) 5-325 MG tablet 30 tablet 0  ?  Sig: Take 1 tablet by mouth every 6 (six) hours as needed for moderate pain.  ?  ? Not Delegated - Analgesics:  Opioid Agonist Combinations Failed - 05/24/2021  2:55 PM  ?  ?  Failed - This refill cannot be delegated  ?  ?  Failed - Urine Drug Screen completed in last 360 days  ?  ?  Passed - Valid encounter within last 3 months  ?  Recent Outpatient Visits   ? ?       ? 1 month ago COPD with acute exacerbation ([place])  ? [facility] Pickard, Cammie Mcgee, MD  ? 1 month ago COPD with acute exacerbation [facility])  ? [facility] Pickard, Cammie Mcgee, MD  ? 2 months ago COPD with acute exacerbation [facility])  ? [facility] Pickard, Cammie Mcgee, MD  ? 2 months ago Acute pain of left knee  ? [facility] Pickard, Cammie Mcgee, MD  ? 8 months ago Mixed simple and mucopurulent chronic bronchitis ([place])  ? [place] Eulogio Bear, NP  ? ?  ?  ?Future Appointments   ? ?        ? In 2 weeks Mcarthur Rossetti, MD [facility]  ? In 1 month Chesley Mires, MD [place] [facility]  ? ?  ? ? ?  ?  ?  ? ? ? ?

## 2021-05-26 MED ORDER — HYDROCODONE-ACETAMINOPHEN 5-325 MG PO TABS: 1.0000 | ORAL_TABLET | Freq: Four times a day (QID) | ORAL | 0 refills | Status: DC | PRN

## 2021-05-26 NOTE — Telephone Encounter (Signed)
LOV 04/11/21 ?Last refill 05/02/21, #30, 0 refills ? ?Please review, thanks! ? ?

## 2021-05-28 ENCOUNTER — Other Ambulatory Visit: Payer: Self-pay | Admitting: Family Medicine

## 2021-05-30 NOTE — Telephone Encounter (Signed)
Rx 04/11/21 #30 3RF- too soon ?Requested Prescriptions  ?Pending Prescriptions Disp Refills  ?? montelukast (SINGULAIR) 10 MG tablet [Pharmacy Med Name: MONTELUKAST SOD 10 MG TABLET] 90 tablet 1  ?  Sig: TAKE 1 TABLET BY MOUTH EVERYDAY AT BEDTIME  ?  ? Pulmonology:  Leukotriene Inhibitors Passed - 05/28/2021  3:41 PM  ?  ?  Passed - Valid encounter within last 12 months  ?  Recent Outpatient Visits   ?      ? 1 month ago COPD with acute exacerbation ([place])  ? [facility] Pickard, Cammie Mcgee, MD  ? 1 month ago COPD with acute exacerbation [facility])  ? [facility] Pickard, Cammie Mcgee, MD  ? 2 months ago COPD with acute exacerbation [facility])  ? [facility] Pickard, Cammie Mcgee, MD  ? 2 months ago Acute pain of left knee  ? [facility] Pickard, Cammie Mcgee, MD  ? 9 months ago Mixed simple and mucopurulent chronic bronchitis ([place])  ? [place] Eulogio Bear, NP  ?  ?  ?Future Appointments   ?        ? In 2 weeks Mcarthur Rossetti, MD [facility]  ? In 1 month Chesley Mires, MD [place] [facility]  ?  ? ?  ?  ?  ? ?

## 2021-06-02 ENCOUNTER — Telehealth: Payer: Self-pay | Admitting: Family Medicine

## 2021-06-02 NOTE — Telephone Encounter (Signed)
Patient requesting a portable oxygen tank. She states she goes out of town and would like something smaller than what she has at home. ? ?CB# [phone] ?

## 2021-06-03 NOTE — Telephone Encounter (Signed)
Left a message for a return call for details ?

## 2021-06-06 ENCOUNTER — Other Ambulatory Visit: Payer: Self-pay | Admitting: Family Medicine

## 2021-06-06 DIAGNOSIS — J441 Chronic obstructive pulmonary disease with (acute) exacerbation: Secondary | ICD-10-CM

## 2021-06-06 DIAGNOSIS — R0602 Shortness of breath: Secondary | ICD-10-CM

## 2021-06-06 NOTE — Telephone Encounter (Signed)
Patient has current O2 through [place]. Advised patient to contact [employer] to see if her insurance would cover a POC, if so we can certainly send an order. Patient will call them and either call us back or have them contact us. Nothing further needed at this time.  ? ?

## 2021-06-07 NOTE — Telephone Encounter (Signed)
Requested Prescriptions  ?Pending Prescriptions Disp Refills  ?? albuterol (PROVENTIL) (2.5 MG/3ML) 0.083% nebulizer solution [Pharmacy Med Name: ALBUTEROL SUL 2.5 MG/3 ML SOLN] 300 mL 3  ?  Sig: INHALE 3 ML BY NEBULIZATION EVERY 6 HOURS AS NEEDED FOR WHEEZING OR SHORTNESS OF BREATH  ?  ? Pulmonology:  Beta Agonists 2 Failed - 06/06/2021  1:19 PM  ?  ?  Failed - Last BP in normal range  ?  BP Readings from Last 1 Encounters:  ?04/11/21 139/90  ?   ?  ?  Passed - Last Heart Rate in normal range  ?  Pulse Readings from Last 1 Encounters:  ?04/11/21 93  ?   ?  ?  Passed - Valid encounter within last 12 months  ?  Recent Outpatient Visits   ?      ? 1 month ago COPD with acute exacerbation ([place])  ? [facility] Pickard, Cammie Mcgee, MD  ? 2 months ago COPD with acute exacerbation [facility])  ? [facility] Pickard, Cammie Mcgee, MD  ? 2 months ago COPD with acute exacerbation [facility])  ? [facility] Pickard, Cammie Mcgee, MD  ? 2 months ago Acute pain of left knee  ? [facility] Pickard, Cammie Mcgee, MD  ? 9 months ago Mixed simple and mucopurulent chronic bronchitis ([place])  ? [place] Eulogio Bear, NP  ?  ?  ?Future Appointments   ?        ? In 1 week Mcarthur Rossetti, MD [facility]  ? In 3 weeks Chesley Mires, MD [place] [facility]  ?  ? ?  ?  ?  ? ?

## 2021-06-14 ENCOUNTER — Ambulatory Visit: Payer: Medicare HMO | Admitting: Orthopaedic Surgery

## 2021-06-14 ENCOUNTER — Ambulatory Visit (INDEPENDENT_AMBULATORY_CARE_PROVIDER_SITE_OTHER): Payer: Medicare HMO

## 2021-06-14 ENCOUNTER — Telehealth: Payer: Self-pay | Admitting: Pulmonary Disease

## 2021-06-14 ENCOUNTER — Encounter: Payer: Self-pay | Admitting: Orthopaedic Surgery

## 2021-06-14 DIAGNOSIS — Z96641 Presence of right artificial hip joint: Secondary | ICD-10-CM | POA: Diagnosis not present

## 2021-06-14 NOTE — Progress Notes (Signed)
HPI: [name] returns today status post right total hip arthroplasty 09/24/2020.  She states she is overall doing well she still is a little sore using a cane but states the hip is doing well overall.  She has had no new injuries.  She has been placed on gabapentin due to some neuropathy-like sensation in her feet by another physician and she states this is not helping much she is also asking about swelling she is having in both lower legs and feet.  Review of systems: See HPI otherwise negative  Physical exam: General well-developed well-nourished female who ambulates with a cane with a nonantalgic gait. Right hip full range of motion without pain.  Surgical incisions well-healed.  Calf supple nontender.  Bilateral lower extremities slight nonpitting edema bilaterally.  Radiographs: AP pelvis lateral view of the right hip: Status post right total hip arthroplasty well-seated components.  No hardware failure.  No acute fractures or acute findings.  Impression: Status post right total hip arthroplasty 09/24/2020  Plan: Discussed with her the use of compression stockings to help with her lower leg swelling.  We will see her back at 1 year postop for an AP pelvis lateral view of the right hip.  Follow-up with [place] sooner if there is any questions concerns.  Questions were encouraged and answered.

## 2021-06-14 NOTE — Telephone Encounter (Signed)
Called and notified patient that a sample of Spiriva would be at front for her to pick up. She states she will come get it tomorrow 06/15/21. Sample placed up front. Nothing further needed

## 2021-06-16 ENCOUNTER — Telehealth: Payer: Self-pay | Admitting: Pharmacist

## 2021-06-16 NOTE — Progress Notes (Signed)
Chronic Care Management Pharmacy Assistant   Name: Mary Blevins  MRN: [id number] DOB: [birth year]   Reason for Encounter: Disease State - General Adherence Call     Recent office visits:  04/11/21 Jenna Luo MD - Family Medicine - COPD - montelukast (SINGULAIR) 10 MG tablet prescribed. Follow up as scheduled.   04/01/21 Jenna Luo MD - Family Medicine - COPD - add Mucinex 12-hour 1 pill twice a day indefinitely to try to help thin the mucus in her airway. Follow up as needed.   03/24/21 Jenna Luo MD - Family Medicine - COPD - benzonatate (TESSALON) 100 MG capsule, levofloxacin (LEVAQUIN) 500 MG tablet, and predniSONE (DELTASONE) 20 MG tablet prescribed. Follow up as scheduled.  03/15/21 Jenna Luo MD - Family Medicine - Left knee pain - Left knee joint injection administered in the office without complication. Follow up I 1 week or sooner if needed. Follow up in 1 week if no improvement.    Recent consult visits:  06/14/21 Jean Rosenthal MD - Total knee replacement - XR ordered of hip and pelvis. Follow up as scheduled.   02/25/21 Christinia Gully, MD - Pulmonology - COPD - HYDROcodone-acetaminophen (NORCO) 5-325 MG tablet, predniSONE (DELTASONE) 10 MG tablet, Tiotropium Bromide Monohydrate (SPIRIVA RESPIMAT) 2.5 MCG/ACT AERS prescribed.   01/11/21 Andrey Spearman MD - Neurology - Numbness and tingling in right hand - NCV ordered. Follow up for NCV.   Hospital visits: 04/07/21 - 04/08/21 Medication Reconciliation was completed by comparing discharge summary, patient's EMR and Pharmacy list, and upon discussion with patient.  Admitted to the hospital on 04/07/21 due to COPD. Discharge date was 04/08/21. Discharged from [place]?Medications Started at [facility] Discharge:?? doxycycline (VIBRAMYCIN) 100 MG capsule predniSONE (DELTASONE) 20 MG tablet  Medication Changes at Hospital Discharge: None noted  Medications Discontinued at Hospital  Discharge: None noted  Medications that remain the same after Hospital Discharge:??  All other medications will remain the same.    Medications: Outpatient Encounter Medications as of 06/16/2021  Medication Sig   albuterol (PROVENTIL) (2.5 MG/3ML) 0.083% nebulizer solution INHALE 3 ML BY NEBULIZATION EVERY 6 HOURS AS NEEDED FOR WHEEZING OR SHORTNESS OF BREATH   albuterol (VENTOLIN HFA) 108 (90 Base) MCG/ACT inhaler Inhale 2 puffs into the lungs every 6 (six) hours as needed for wheezing or shortness of breath.   budesonide-formoterol (SYMBICORT) 160-4.5 MCG/ACT inhaler Inhale 2 puffs into the lungs 2 (two) times daily.   doxycycline (VIBRAMYCIN) 100 MG capsule Take 1 capsule (100 mg total) by mouth 2 (two) times daily.   DULoxetine (CYMBALTA) 60 MG capsule    fluticasone (FLONASE) 50 MCG/ACT nasal spray SPRAY 2 SPRAYS INTO EACH NOSTRIL EVERY DAY   gabapentin (NEURONTIN) 300 MG capsule Take 300 mg by mouth daily.   HYDROcodone bit-homatropine (HYCODAN) 5-1.5 MG/5ML syrup Take 5 mLs by mouth every 8 (eight) hours as needed for cough.   HYDROcodone-acetaminophen (NORCO) 5-325 MG tablet Take 1 tablet by mouth every 6 (six) hours as needed for moderate pain.   HYDROcodone-acetaminophen (NORCO) 7.5-325 MG tablet Take 1 tablet by mouth every 6 (six) hours as needed for moderate pain.   hydrOXYzine (ATARAX/VISTARIL) 25 MG tablet Take 1 tablet (25 mg total) by mouth 3 (three) times daily as needed for itching.   levothyroxine (SYNTHROID) 50 MCG tablet TAKE 1 TABLET BY MOUTH EVERY DAY BEFORE BREAKFAST   montelukast (SINGULAIR) 10 MG tablet Take 1 tablet (10 mg total) by mouth at bedtime.   predniSONE (  DELTASONE) 20 MG tablet Take 2 tablets (40 mg total) by mouth daily.   Propylene Glycol-Glycerin 0.6-0.6 % SOLN Place 1 drop into both eyes 2 (two) times daily. Artifical tears   rosuvastatin (CRESTOR) 5 MG tablet Take 1 tablet (5 mg total) by mouth daily.   sodium chloride (OCEAN) 0.65 % SOLN nasal spray  Place 1 spray into both nostrils daily.   Tiotropium Bromide Monohydrate (SPIRIVA RESPIMAT) 2.5 MCG/ACT AERS Inhale 2 puffs into the lungs daily.   tiZANidine (ZANAFLEX) 4 MG tablet TAKE 1 TABLET BY MOUTH EVERY 6 HOURS AS NEEDED FOR MUSCLE SPASMS.   verapamil (CALAN-SR) 120 MG CR tablet TAKE 1 TABLET EVERY DAY   No facility-administered encounter medications on file as of 06/16/2021.   Contacted [name] for General Review Call   Chart Review:  Have there been any documented new, changed, or discontinued medications since last visit? Yes (If yes, include name, dose, frequency, date) Has there been any documented recent hospitalizations or ED visits since last visit with Clinical Pharmacist? Yes Brief Summary (including medication and/or Diagnosis changes): see above   Adherence Review:  Does the Clinical Pharmacist Assistant have access to adherence rates? Yes Adherence rates for STAR metric medications (List medication(s)/day supply/ last 2 fill dates). Adherence rates for medications indicated for disease state being reviewed (List medication(s)/day supply/ last 2 fill dates). Does the patient have >5 day gap between last estimated fill dates for any of the above medications or other medication gaps? No Reason for medication gaps.   Disease State Questions:  Able to connect with Patient?  Did patient have any problems with their health recently?  Note problems and Concerns: Have you had any admissions or emergency room visits or worsening of your condition(s) since last visit?  Details of ED visit, hospital visit and/or worsening condition(s): Have you had any visits with new specialists or providers since your last visit?  Explain: Have you had any new health care problem(s) since your last visit?  New problem(s) reported: Have you run out of any of your medications since you last spoke with clinical pharmacist?  What caused you to run out of your medications? Are  there any medications you are not taking as prescribed?  What kept you from taking your medications as prescribed? Are you having any issues or side effects with your medications?  Note of issues or side effects: Do you have any other health concerns or questions you want to discuss with your Clinical Pharmacist before your next visit?  Note additional concerns and questions from Patient. Are there any health concerns that you feel we can do a better job addressing?  Note Patient's response. Are you having any problems with any of the following since the last visit: (select all that apply)    Details: 12. Any falls since last visit?   Details: 13. Any increased or uncontrolled pain since last visit?   Details: 14. Next visit Type: office       Visit with: Vineet Soot          Date: 06/30/21        Time: 12:00 pm  15. Additional Details?     Care Gaps  AWV: overdue Colonoscopy: done 01/06/16 DM Eye Exam: N/A DM Foot Exam: N/A Microalbumin: N/A HbgAIC:  N/A DEXA: done 07/21/16 Mammogram: done 01/09/20   Star Rating Drugs: No Star Rating Drugs noted   Future Appointments  Date Time Provider [place]  06/30/2021 12:00 PM Sood, [place],  MD [facility] None   Multiple attempts were made to contact patient. Attempts were unsuccessful. / ls,CMA   Jobe Gibbon, [place] Pharmacist Assistant  [phone]

## 2021-06-23 ENCOUNTER — Other Ambulatory Visit: Payer: Self-pay

## 2021-06-23 ENCOUNTER — Telehealth: Payer: Self-pay | Admitting: Pulmonary Disease

## 2021-06-23 MED ORDER — HYDROCODONE-ACETAMINOPHEN 7.5-325 MG PO TABS: 1.0000 | ORAL_TABLET | Freq: Four times a day (QID) | ORAL | 0 refills | Status: DC | PRN

## 2021-06-23 MED ORDER — SPIRIVA RESPIMAT 2.5 MCG/ACT IN AERS: 2.0000 | INHALATION_SPRAY | Freq: Every day | RESPIRATORY_TRACT | 11 refills | Status: DC

## 2021-06-23 NOTE — Telephone Encounter (Signed)
Last filled 05/28/21 Last ov 04/11/21

## 2021-06-23 NOTE — Telephone Encounter (Signed)
Called and notified patient that Dr. Halford Chessman is out of office until Monday but I will have him sign Rx and will sent to patient assistance program. She voiced understanding.

## 2021-06-27 NOTE — Telephone Encounter (Signed)
New RX faxed to [facility] cares and pharmacord.  ATC patient to notify but line was disconnected.  Nothing further needed.

## 2021-06-28 ENCOUNTER — Other Ambulatory Visit: Payer: Self-pay | Admitting: Family Medicine

## 2021-06-28 NOTE — Telephone Encounter (Signed)
Requested medication (s) are due for refill today: no  Requested medication (s) are on the active medication list: yes  Last refill:  06/23/21 #4g/11  Future visit scheduled: no  Notes to clinic:  rx was given by print by another provider. Please assess     Requested Prescriptions  Pending Prescriptions Disp Refills   SPIRIVA RESPIMAT 2.5 MCG/ACT AERS [Pharmacy Med Name: SPIRIVA RESPIMAT 2.5 MCG/ACT Aerosol Solution] 12 g     Sig: INHALE 2 PUFFS INTO THE LUNGS DAILY.     Pulmonology:  Anticholinergic Agents Passed - 06/28/2021 10:47 AM      Passed - Valid encounter within last 12 months    Recent Outpatient Visits           2 months ago COPD with acute exacerbation ([place])   [place] [name], MD   2 months ago COPD with acute exacerbation ([place])   [place] [name], MD   3 months ago COPD with acute exacerbation [facility])   [facility] [name], MD   3 months ago Acute pain of left knee   [place] [name], MD   9 months ago Mixed simple and mucopurulent chronic bronchitis ([place])   [facility] Eulogio Bear, NP       Future Appointments             In 2 days Chesley Mires, MD [facility]

## 2021-06-30 ENCOUNTER — Ambulatory Visit: Payer: Medicare HMO | Admitting: Pulmonary Disease

## 2021-07-01 ENCOUNTER — Encounter: Payer: Self-pay | Admitting: Pulmonary Disease

## 2021-07-01 ENCOUNTER — Ambulatory Visit: Payer: Medicare HMO | Admitting: Pulmonary Disease

## 2021-07-01 VITALS — BP 128/74 | HR 64 | Temp 98.7°F | Ht 65.0 in | Wt 160.4 lb

## 2021-07-01 DIAGNOSIS — J432 Centrilobular emphysema: Secondary | ICD-10-CM

## 2021-07-01 NOTE — Patient Instructions (Signed)
Follow up in 1 year.

## 2021-07-01 NOTE — Progress Notes (Signed)
[place] [facility]  Chief Complaint  Patient presents with   Follow-up    Feels like inhalers are not making a difference.      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  Brief Summary:  Mary Blevins is a 76 y.o. female former smoker with COPD/emphysema.      Subjective:   She tried breztri, but didn't seem to help.  She is back to using spiriva and symbicort.  She has more sinus congestion, post nasal drip, and cough over past couple days since we had more [place] smoke.  Not having wheeze, chest pain, fever, or hemoptysis.  She has cold feet.  She was told by cardiology that her circulation was fine.  Chest xray from 04/07/21 showed mild bronchitic changes.  Physical Exam:   Appearance - well kempt   ENMT - no sinus tenderness, no oral exudate, no LAN, Mallampati 3 airway, no stridor  Respiratory - equal breath sounds bilaterally, no wheezing or rales  CV - s1s2 regular rate and rhythm, no murmurs  Ext - no clubbing, no edema  Skin - no rashes  Psych - normal mood and affect    Pulmonary testing:  PFT 03/01/17 >> FEV1 0.99 (51%), FEV1% 49, TLC 6.41 (121%), DLCO 51%, no BD A1AT 06/02/19 >> 102, MM  Social History:  She  reports that she quit smoking about 15 years ago. Her smoking use included cigarettes. She has never used smokeless tobacco. She reports current  alcohol use. She reports that she does not use drugs.  Family History:  Her family history includes Breast cancer in her maternal aunt; Dementia in her mother; Diabetes in her brother; Healthy in her father and sister; Heart attack in her maternal grandmother, mother, and another family member; Heart disease in her mother; Prostate cancer in her maternal grandfather.     Assessment/Plan:   COPD with emphysema. - trelegy and breztri weren't effective - continue spiriva and symbicort - prn albuterol and mucinex   Time Spent Involved in Patient Care on Day of Examination:  26 minutes  Follow up:   Patient Instructions  Follow up in 1 year  Medication List:   Allergies as of 07/01/2021       Reactions   Oxycodone Other (See Comments)   Hallucinations   Meloxicam Other (See Comments)   BAD DREAMS, SAD THOUGHTS        Medication List        Accurate as of July 01, 2021 11:57 AM. If you have any questions, ask your nurse or doctor.          STOP taking these medications    doxycycline 100 MG capsule Commonly known as: VIBRAMYCIN Stopped by: Chesley Mires, MD   predniSONE 20 MG tablet Commonly known as: DELTASONE Stopped by: Chesley Mires, MD       TAKE these medications    albuterol 108 (90  Base) MCG/ACT inhaler Commonly known as: VENTOLIN HFA Inhale 2 puffs into the lungs every 6 (six) hours as needed for wheezing or shortness of breath.   albuterol (2.5 MG/3ML) 0.083% nebulizer solution Commonly known as: PROVENTIL INHALE 3 ML BY NEBULIZATION EVERY 6 HOURS AS NEEDED FOR WHEEZING OR SHORTNESS OF BREATH   budesonide-formoterol 160-4.5 MCG/ACT inhaler Commonly known as: SYMBICORT Inhale 2 puffs into the lungs 2 (two) times daily.   DULoxetine 60 MG capsule Commonly known as: CYMBALTA   fluticasone 50 MCG/ACT nasal spray Commonly known as: FLONASE SPRAY 2 SPRAYS INTO EACH NOSTRIL EVERY DAY   gabapentin 300 MG capsule Commonly known as: NEURONTIN Take  300 mg by mouth daily.   HYDROcodone bit-homatropine 5-1.5 MG/5ML syrup Commonly known as: HYCODAN Take 5 mLs by mouth every 8 (eight) hours as needed for cough.   HYDROcodone-acetaminophen 5-325 MG tablet Commonly known as: Norco Take 1 tablet by mouth every 6 (six) hours as needed for moderate pain.   HYDROcodone-acetaminophen 7.5-325 MG tablet Commonly known as: Norco Take 1 tablet by mouth every 6 (six) hours as needed for moderate pain.   hydrOXYzine 25 MG tablet Commonly known as: ATARAX Take 1 tablet (25 mg total) by mouth 3 (three) times daily as needed for itching.   levothyroxine 50 MCG tablet Commonly known as: SYNTHROID TAKE 1 TABLET BY MOUTH EVERY DAY BEFORE BREAKFAST   montelukast 10 MG tablet Commonly known as: SINGULAIR Take 1 tablet (10 mg total) by mouth at bedtime.   Propylene Glycol-Glycerin 0.6-0.6 % Soln Place 1 drop into both eyes 2 (two) times daily. Artifical tears   rosuvastatin 10 MG tablet Commonly known as: CRESTOR Take 10 mg by mouth daily. What changed: Another medication with the same name was removed. Continue taking this medication, and follow the directions you see here. Changed by: Chesley Mires, MD   sodium chloride 0.65 % Soln nasal spray Commonly known as: OCEAN Place 1 spray into both nostrils daily.   Spiriva Respimat 2.5 MCG/ACT Aers Generic drug: Tiotropium Bromide Monohydrate Inhale 2 puffs into the lungs daily. What changed: Another medication with the same name was removed. Continue taking this medication, and follow the directions you see here. Changed by: Chesley Mires, MD   tiZANidine 4 MG tablet Commonly known as: ZANAFLEX TAKE 1 TABLET BY MOUTH EVERY 6 HOURS AS NEEDED FOR MUSCLE SPASMS.   verapamil 120 MG CR tablet Commonly known as: CALAN-SR TAKE 1 TABLET EVERY DAY        Signature:  Chesley Mires, MD [place] Pager - [phone] 07/01/2021, 11:57 AM

## 2021-07-07 ENCOUNTER — Other Ambulatory Visit: Payer: Self-pay | Admitting: Family Medicine

## 2021-07-07 ENCOUNTER — Telehealth: Payer: Medicare HMO

## 2021-07-07 NOTE — Telephone Encounter (Signed)
Requested Prescriptions  Pending Prescriptions Disp Refills  . montelukast (SINGULAIR) 10 MG tablet [Pharmacy Med Name: MONTELUKAST SOD 10 MG TABLET] 90 tablet 1    Sig: TAKE 1 TABLET BY MOUTH EVERYDAY AT BEDTIME     Pulmonology:  Leukotriene Inhibitors Passed - 07/07/2021 10:17 AM      Passed - Valid encounter within last 12 months    Recent Outpatient Visits          2 months ago COPD with acute exacerbation ([place])   [place] [name], MD   3 months ago COPD with acute exacerbation ([place])   [facility] [name], MD   3 months ago COPD with acute exacerbation [facility])   [facility] [name], MD   3 months ago Acute pain of left knee   [place] [name], MD   10 months ago Mixed simple and mucopurulent chronic bronchitis ([place])   [place] Eulogio Bear, NP

## 2021-07-11 ENCOUNTER — Telehealth: Payer: Self-pay | Admitting: Pharmacist

## 2021-07-11 NOTE — Progress Notes (Cosign Needed Addendum)
Chronic Care Management Pharmacy Assistant   Name: Mary Blevins  MRN: [id number] DOB: May 26, 1945   Reason for Encounter: Disease State - General Adherence Call    Recent office visits:  None noted .  Recent consult visits:  None noted  Hospital visits: 04/07/21 - 04/08/21 Medication Reconciliation was completed by comparing discharge summary, patient's EMR and Pharmacy list, and upon discussion with patient.   Admitted to the hospital on 04/07/21 due to COPD. Discharge date was 04/08/21. Discharged from [place]?Medications Started at [facility] Discharge:?? doxycycline (VIBRAMYCIN) 100 MG capsule predniSONE (DELTASONE) 20 MG tablet   Medication Changes at Hospital Discharge: None noted   Medications Discontinued at Hospital Discharge: None noted   Medications that remain the same after Hospital Discharge:??  All other medications will remain the same.    Medications: Outpatient Encounter Medications as of 07/11/2021  Medication Sig   albuterol (PROVENTIL) (2.5 MG/3ML) 0.083% nebulizer solution INHALE 3 ML BY NEBULIZATION EVERY 6 HOURS AS NEEDED FOR WHEEZING OR SHORTNESS OF BREATH   albuterol (VENTOLIN HFA) 108 (90 Base) MCG/ACT inhaler Inhale 2 puffs into the lungs every 6 (six) hours as needed for wheezing or shortness of breath.   budesonide-formoterol (SYMBICORT) 160-4.5 MCG/ACT inhaler Inhale 2 puffs into the lungs 2 (two) times daily.   DULoxetine (CYMBALTA) 60 MG capsule    fluticasone (FLONASE) 50 MCG/ACT nasal spray SPRAY 2 SPRAYS INTO EACH NOSTRIL EVERY DAY   gabapentin (NEURONTIN) 300 MG capsule Take 300 mg by mouth daily.   HYDROcodone bit-homatropine (HYCODAN) 5-1.5 MG/5ML syrup Take 5 mLs by mouth every 8 (eight) hours as needed for cough.   HYDROcodone-acetaminophen (NORCO) 5-325 MG tablet Take 1 tablet by mouth every 6 (six) hours as needed for moderate pain.   HYDROcodone-acetaminophen (NORCO) 7.5-325 MG tablet Take 1 tablet by  mouth every 6 (six) hours as needed for moderate pain.   hydrOXYzine (ATARAX/VISTARIL) 25 MG tablet Take 1 tablet (25 mg total) by mouth 3 (three) times daily as needed for itching.   levothyroxine (SYNTHROID) 50 MCG tablet TAKE 1 TABLET BY MOUTH EVERY DAY BEFORE BREAKFAST   montelukast (SINGULAIR) 10 MG tablet TAKE 1 TABLET BY MOUTH EVERYDAY AT BEDTIME   Propylene Glycol-Glycerin 0.6-0.6 % SOLN Place 1 drop into both eyes 2 (two) times daily. Artifical tears   rosuvastatin (CRESTOR) 10 MG tablet Take 10 mg by mouth daily.   sodium chloride (OCEAN) 0.65 % SOLN nasal spray Place 1 spray into both nostrils daily.   Tiotropium Bromide Monohydrate (SPIRIVA RESPIMAT) 2.5 MCG/ACT AERS Inhale 2 puffs into the lungs daily.   tiZANidine (ZANAFLEX) 4 MG tablet TAKE 1 TABLET BY MOUTH EVERY 6 HOURS AS NEEDED FOR MUSCLE SPASMS.   verapamil (CALAN-SR) 120 MG CR tablet TAKE 1 TABLET EVERY DAY   No facility-administered encounter medications on file as of 07/11/2021.    Contacted Mary Blevins for General Review Call   Chart Review:   Have there been any documented new, changed, or discontinued medications since last visit? Yes (If yes, include name, dose, frequency, date) Has there been any documented recent hospitalizations or ED visits since last visit with Clinical Pharmacist? Yes Brief Summary (including medication and/or Diagnosis changes): see above     Adherence Review:   Does the Clinical Pharmacist Assistant have access to adherence rates? Yes Adherence rates for STAR metric medications (List medication(s)/day supply/ last 2 fill dates). Adherence rates for medications indicated for disease state being reviewed (List  medication(s)/day supply/ last 2 fill dates). Does the patient have >5 day gap between last estimated fill dates for any of the above medications or other medication gaps? No Reason for medication gaps. N/A     Disease State Questions:   Able to connect with Patient?  Yes Did patient have any problems with their health recently? No Note problems and Concerns: Have you had any admissions or emergency room visits or worsening of your condition(s) since last visit? No Details of ED visit, hospital visit and/or worsening condition(s): Have you had any visits with new specialists or providers since your last visit? No Explain: Have you had any new health care problem(s) since your last visit? No New problem(s) reported: Have you run out of any of your medications since you last spoke with clinical pharmacist? No What caused you to run out of your medications? Are there any medications you are not taking as prescribed? No What kept you from taking your medications as prescribed? No Are you having any issues or side effects with your medications? No Note of issues or side effects: Do you have any other health concerns or questions you want to discuss with your Clinical Pharmacist before your next visit? No Note additional concerns and questions from Patient. Are there any health concerns that you feel we can do a better job addressing? No Note Patient's response. Are you having any problems with any of the following since the last visit: (select all that apply) None  Details: 12. Any falls since last visit? No             Details: 13. Any increased or uncontrolled pain since last visit? No             Details:  14. Next visit Type:  None scheduled       Visit with:         Date:         Time:    38. Additional Details? No             Care Gaps   AWV: overdue Colonoscopy: done 01/06/16 DM Eye Exam: N/A DM Foot Exam: N/A Microalbumin: N/A HbgAIC:  N/A DEXA: done 07/21/16 Mammogram: done 01/09/20     Star Rating Drugs: No Star Rating Drugs noted    No future appointments.   Jobe Gibbon, [facility] Clinical Pharmacist Assistant  [phone]

## 2021-08-01 ENCOUNTER — Other Ambulatory Visit: Payer: Self-pay | Admitting: Family Medicine

## 2021-08-01 NOTE — Telephone Encounter (Signed)
LOV 04/11/21 Last refill 04/04/21, #30, 0 refills  Please review, thanks!

## 2021-08-08 ENCOUNTER — Other Ambulatory Visit: Payer: Self-pay

## 2021-08-08 MED ORDER — HYDROCODONE-ACETAMINOPHEN 7.5-325 MG PO TABS: 1.0000 | ORAL_TABLET | Freq: Four times a day (QID) | ORAL | 0 refills | Status: DC | PRN

## 2021-08-08 NOTE — Telephone Encounter (Signed)
LOV 04/11/21 Last refill 06/23/21, #30, 0 refills  Please review, thanks!

## 2021-09-05 ENCOUNTER — Other Ambulatory Visit: Payer: Self-pay

## 2021-09-05 MED ORDER — HYDROCODONE-ACETAMINOPHEN 7.5-325 MG PO TABS: 1.0000 | ORAL_TABLET | Freq: Four times a day (QID) | ORAL | 0 refills | Status: DC | PRN

## 2021-09-05 NOTE — Telephone Encounter (Signed)
LOV 04/11/21 Last refill 08/08/21, #30, 0 refills  Please review, thanks!

## 2021-09-09 ENCOUNTER — Other Ambulatory Visit: Payer: Self-pay | Admitting: Family Medicine

## 2021-09-09 NOTE — Telephone Encounter (Signed)
Requested medication (s) are due for refill today: yes  Requested medication (s) are on the active medication list: yes  Last refill:  08/01/21 #30/0  Future visit scheduled: no  Notes to clinic:  Unable to refill per protocol, cannot delegate.    Requested Prescriptions  Pending Prescriptions Disp Refills   tiZANidine (ZANAFLEX) 4 MG tablet [Pharmacy Med Name: TIZANIDINE HCL 4 MG TABLET] 30 tablet 0    Sig: TAKE 1 TABLET BY MOUTH EVERY 6 HOURS AS NEEDED FOR MUSCLE SPASMS.     Not Delegated - Cardiovascular:  Alpha-2 Agonists - tizanidine Failed - 09/09/2021  2:49 PM      Failed - This refill cannot be delegated      Passed - Valid encounter within last 6 months    Recent Outpatient Visits           5 months ago COPD with acute exacerbation ([place])   [place] [name], MD   5 months ago COPD with acute exacerbation ([place])   [place] [name], MD   5 months ago COPD with acute exacerbation [facility])   [place] [facility] [name], MD   5 months ago Acute pain of left knee   [place] [name], MD   1 year ago Mixed simple and mucopurulent chronic bronchitis ([place])   [place] Eulogio Bear, NP

## 2021-09-11 ENCOUNTER — Other Ambulatory Visit: Payer: Self-pay | Admitting: Interventional Cardiology

## 2021-09-13 DIAGNOSIS — H524 Presbyopia: Secondary | ICD-10-CM | POA: Diagnosis not present

## 2021-09-13 DIAGNOSIS — H2513 Age-related nuclear cataract, bilateral: Secondary | ICD-10-CM | POA: Diagnosis not present

## 2021-09-16 ENCOUNTER — Other Ambulatory Visit: Payer: Self-pay | Admitting: Interventional Cardiology

## 2021-09-21 ENCOUNTER — Telehealth: Payer: Self-pay

## 2021-09-21 DIAGNOSIS — H2511 Age-related nuclear cataract, right eye: Secondary | ICD-10-CM | POA: Diagnosis not present

## 2021-09-21 DIAGNOSIS — H269 Unspecified cataract: Secondary | ICD-10-CM | POA: Diagnosis not present

## 2021-09-21 NOTE — Telephone Encounter (Signed)
09/20/21 Pt LVM asking for clarification on one of her medication, Rosuvastatin?  Return call to pt to let pt know that the medication, Rosuvastatin is her cholesterol medication. Agreed per pt. Pt stated that she looked at one of her old bottle and realize it was for her cholesterol.   Nothing further.

## 2021-09-27 ENCOUNTER — Telehealth: Payer: Self-pay

## 2021-09-27 ENCOUNTER — Telehealth: Payer: Self-pay | Admitting: Pulmonary Disease

## 2021-09-27 ENCOUNTER — Other Ambulatory Visit: Payer: Self-pay | Admitting: Family Medicine

## 2021-09-27 MED ORDER — BUDESONIDE-FORMOTEROL FUMARATE 160-4.5 MCG/ACT IN AERO: 2.0000 | INHALATION_SPRAY | Freq: Two times a day (BID) | RESPIRATORY_TRACT | 0 refills | Status: DC

## 2021-09-27 MED ORDER — HYDROCODONE BIT-HOMATROP MBR 5-1.5 MG/5ML PO SOLN: 5.0000 mL | Freq: Three times a day (TID) | ORAL | 0 refills | Status: DC | PRN

## 2021-09-27 NOTE — Telephone Encounter (Signed)
Called and spoke to patient.  She states [employer] told her to contact our office for information on Symbicort coverage.  She has been receiving the inhaler for free.  [employer] is no longer covering symbicort.  Patient would like to change inhalers and would like a sample.  Please advise.

## 2021-09-27 NOTE — Telephone Encounter (Signed)
Pt called requesting refill on Hycodan. Pt states that it helps her rest at night due to coughing up phlegm. Pt states she had eye surgery on 8/30 and was advised not to strain. Advised pt that she may need to be seen. Okay to refill? Thanks.

## 2021-09-27 NOTE — Telephone Encounter (Signed)
Patient returned call and asked if [facility] could [facility] and wait until 9/22 to send in order. Advised patient to call our office when she is ready to have order called in. [facility] [address] and was unable to speak to a rep or leave a vm. Faxed over a cancellation request for order to [phone]

## 2021-09-27 NOTE — Telephone Encounter (Signed)
Can send script for advair hfa [id number] two puffs bid.

## 2021-09-27 NOTE — Telephone Encounter (Signed)
Called and spoke to patient. She asked that instead of changing to advair, that we try sending [facility] to see what her cost will be. [facility] Nothing further needed for now. Patient will call back if it is too expensive for her to pick up.

## 2021-10-04 ENCOUNTER — Telehealth: Payer: Self-pay

## 2021-10-04 ENCOUNTER — Other Ambulatory Visit: Payer: Self-pay | Admitting: Family Medicine

## 2021-10-04 MED ORDER — TIZANIDINE HCL 4 MG PO TABS: 4.0000 mg | ORAL_TABLET | Freq: Four times a day (QID) | ORAL | 0 refills | Status: DC | PRN

## 2021-10-04 NOTE — Telephone Encounter (Signed)
Pt called, stating she has misplaced her Tinzanidine prescription. Pt states, "I have looked everywhere for it." Can a new rx be sent in for her? Last RF was 09/09/2021. Thank you!

## 2021-10-10 ENCOUNTER — Telehealth: Payer: Self-pay | Admitting: Pulmonary Disease

## 2021-10-10 MED ORDER — BUDESONIDE-FORMOTEROL FUMARATE 160-4.5 MCG/ACT IN AERO: 2.0000 | INHALATION_SPRAY | Freq: Two times a day (BID) | RESPIRATORY_TRACT | 0 refills | Status: AC

## 2021-10-10 NOTE — Telephone Encounter (Signed)
Called and spoke to patient. No spiriva samples available at this time but patient was agreeable to sending in symbicort rx [facility] supply [facility]. Nothing further needed at this time

## 2021-10-13 ENCOUNTER — Telehealth: Payer: Self-pay

## 2021-10-13 NOTE — Telephone Encounter (Signed)
Pt called requesting refill on Hydrocodone/Norco. Last RF was 09/05/2021. Last OV was 04/01/2021. [facility] [place].

## 2021-10-14 ENCOUNTER — Other Ambulatory Visit: Payer: Self-pay | Admitting: Family Medicine

## 2021-10-14 MED ORDER — HYDROCODONE-ACETAMINOPHEN 7.5-325 MG PO TABS: 1.0000 | ORAL_TABLET | Freq: Four times a day (QID) | ORAL | 0 refills | Status: DC | PRN

## 2021-10-19 ENCOUNTER — Telehealth: Payer: Self-pay

## 2021-10-19 NOTE — Telephone Encounter (Signed)
Pt called in concerned about taking a cough med that has hydrocodone in it. Pt just wanted to know if she should take this med with any of her other meds tonight. Please advise.  Cb#: [phone]

## 2021-10-20 NOTE — Telephone Encounter (Signed)
[facility] [place] to inform her they're out of stock for HYDROcodone-acetaminophen ([place]) 7.5-325 MG tablet and have it on back order. Pharmacy advised patient to have Rx called into a different pharmacy.  Patient requesting [facility] on [address] in [place], or [place].  Please advise at [phone].

## 2021-10-21 ENCOUNTER — Other Ambulatory Visit: Payer: Self-pay | Admitting: Family Medicine

## 2021-10-21 MED ORDER — HYDROCODONE-ACETAMINOPHEN 7.5-325 MG PO TABS: 1.0000 | ORAL_TABLET | Freq: Four times a day (QID) | ORAL | 0 refills | Status: DC | PRN

## 2021-10-29 ENCOUNTER — Emergency Department (HOSPITAL_COMMUNITY): Payer: Medicare HMO

## 2021-10-29 ENCOUNTER — Other Ambulatory Visit: Payer: Self-pay

## 2021-10-29 ENCOUNTER — Encounter (HOSPITAL_COMMUNITY): Payer: Self-pay

## 2021-10-29 ENCOUNTER — Emergency Department (HOSPITAL_COMMUNITY)
Admission: EM | Admit: 2021-10-29 | Discharge: 2021-10-29 | Disposition: A | Discharge status: Home / Self Care | Payer: Medicare HMO | Attending: Emergency Medicine | Admitting: Emergency Medicine

## 2021-10-29 DIAGNOSIS — R062 Wheezing: Secondary | ICD-10-CM | POA: Diagnosis not present

## 2021-10-29 DIAGNOSIS — J441 Chronic obstructive pulmonary disease with (acute) exacerbation: Secondary | ICD-10-CM | POA: Insufficient documentation

## 2021-10-29 DIAGNOSIS — R059 Cough, unspecified: Secondary | ICD-10-CM | POA: Diagnosis not present

## 2021-10-29 DIAGNOSIS — R0602 Shortness of breath: Secondary | ICD-10-CM | POA: Diagnosis not present

## 2021-10-29 MED ORDER — DOXYCYCLINE HYCLATE 100 MG PO TABS
100.0000 mg | ORAL_TABLET | Freq: Once | ORAL | Status: AC
  Administered 2021-10-29: 100 mg via ORAL
  Filled 2021-10-29: qty 1

## 2021-10-29 MED ORDER — PREDNISONE 20 MG PO TABS: 40.0000 mg | ORAL_TABLET | Freq: Every day | ORAL | 0 refills | Status: DC

## 2021-10-29 MED ORDER — DOXYCYCLINE HYCLATE 100 MG PO CAPS: 100.0000 mg | ORAL_CAPSULE | Freq: Two times a day (BID) | ORAL | 0 refills | Status: DC

## 2021-10-29 MED ORDER — PREDNISONE 50 MG PO TABS
60.0000 mg | ORAL_TABLET | Freq: Once | ORAL | Status: AC
  Administered 2021-10-29: 60 mg via ORAL
  Filled 2021-10-29: qty 1

## 2021-10-29 MED ORDER — GUAIFENESIN ER 600 MG PO TB12: 600.0000 mg | ORAL_TABLET | Freq: Two times a day (BID) | ORAL | 0 refills | Status: DC

## 2021-10-29 NOTE — ED Provider Notes (Signed)
[facility] EMERGENCY DEPARTMENT Provider Note   CSN: [id number] Arrival date & time: 10/29/21  1548     History  Chief Complaint  Patient presents with   Shortness of Breath    Mary Blevins is a 76 y.o. female, history of COPD, who presents to the ED secondary to increased shortness of breath, cough, and sputum for the last 3 to 4 days.  She states that with the weather change she has increased shortness of breath, has been staying with her aunt and been without her oxygen machine.  She notes she does not need an oxygen machine just needs to go get hers.  She states that she has been using her albuterol inhaler about once a day, and gave herself a DuoNeb treatment at 3 PM with some relief today.  She endorses some additional wheezing.  No chest pain, or abdominal pain.  No fevers or chills or sore throat or runny nose.  Denies any sick contacts.  Home Medications Prior to Admission medications   Medication Sig Start Date End Date Taking? Authorizing Provider  doxycycline (VIBRAMYCIN) 100 MG capsule Take 1 capsule (100 mg total) by mouth 2 (two) times daily. 10/29/21  Yes Dawnell Bryant L, PA  guaiFENesin (MUCINEX) 600 MG 12 hr tablet Take 1 tablet (600 mg total) by mouth 2 (two) times daily. 10/29/21  Yes Rhanda Lemire L, PA  predniSONE (DELTASONE) 20 MG tablet Take 2 tablets (40 mg total) by mouth daily. 10/29/21  Yes Faatima Tench L, PA  albuterol (PROVENTIL) (2.5 MG/3ML) 0.083% nebulizer solution INHALE 3 ML BY NEBULIZATION EVERY 6 HOURS AS NEEDED FOR WHEEZING OR SHORTNESS OF BREATH 06/07/21   [name], MD  albuterol (VENTOLIN HFA) 108 (90 Base) MCG/ACT inhaler Inhale 2 puffs into the lungs every 6 (six) hours as needed for wheezing or shortness of breath. 05/18/21   Chesley Mires, MD  budesonide-formoterol (SYMBICORT) 160-4.5 MCG/ACT inhaler Inhale 2 puffs into the lungs 2 (two) times daily. 10/10/21   Chesley Mires, MD  DULoxetine (CYMBALTA) 60 MG capsule  11/09/20   [provider]  fluticasone (FLONASE) 50 MCG/ACT nasal spray SPRAY 2 SPRAYS INTO EACH NOSTRIL EVERY DAY 07/11/18   [name], MD  gabapentin (NEURONTIN) 300 MG capsule Take 300 mg by mouth daily. 03/09/21   [provider]  HYDROcodone bit-homatropine (HYCODAN) 5-1.5 MG/5ML syrup Take 5 mLs by mouth every 8 (eight) hours as needed for cough. 09/27/21   [name], MD  HYDROcodone-acetaminophen (NORCO) 7.5-325 MG tablet Take 1 tablet by mouth every 6 (six) hours as needed for moderate pain. 10/21/21   [name], MD  hydrOXYzine (ATARAX/VISTARIL) 25 MG tablet Take 1 tablet (25 mg total) by mouth 3 (three) times daily as needed for itching. 10/13/20   [name], MD  levothyroxine (SYNTHROID) 50 MCG tablet TAKE 1 TABLET BY MOUTH EVERY DAY BEFORE BREAKFAST 01/18/21   [name], MD  montelukast (SINGULAIR) 10 MG tablet TAKE 1 TABLET BY MOUTH EVERYDAY AT BEDTIME 07/07/21   [name], MD  Propylene Glycol-Glycerin 0.6-0.6 % SOLN Place 1 drop into both eyes 2 (two) times daily. Artifical tears    [provider]  rosuvastatin (CRESTOR) 5 MG tablet TAKE 1 TABLET (5 MG TOTAL) BY MOUTH DAILY. 09/16/21   Belva Crome, MD  sodium chloride (OCEAN) 0.65 % SOLN nasal spray Place 1 spray into both nostrils daily.    [provider]  Tiotropium Bromide Monohydrate (SPIRIVA RESPIMAT)  2.5 MCG/ACT AERS Inhale 2 puffs into the lungs daily. 06/23/21   Chesley Mires, MD  tiZANidine (ZANAFLEX) 4 MG tablet Take 1 tablet (4 mg total) by mouth every 6 (six) hours as needed for muscle spasms. 10/04/21   [name], MD  verapamil (CALAN-SR) 120 MG CR tablet TAKE 1 TABLET EVERY DAY 11/23/20   [name], MD      Allergies    Oxycodone and Meloxicam    Review of Systems   Review of Systems  Respiratory:  Positive for cough, shortness of breath and wheezing. Negative for chest tightness.   Cardiovascular:  Negative for chest pain and leg swelling.   Gastrointestinal:  Negative for abdominal pain.    Physical Exam Updated Vital Signs BP (!) 145/94 (BP Location: Right Arm)   Pulse 80   Temp 98.5 F (36.9 C) (Oral)   Resp 18   Ht '5\' 5"'$  (1.651 m)   Wt 74.4 kg   SpO2 98%   BMI 27.29 kg/m  Physical Exam Vitals and nursing note reviewed.  Constitutional:      General: She is not in acute distress.    Appearance: She is well-developed.  HENT:     Head: Normocephalic and atraumatic.  Eyes:     Conjunctiva/sclera: Conjunctivae normal.  Cardiovascular:     Rate and Rhythm: Normal rate and regular rhythm.     Heart sounds: No murmur heard. Pulmonary:     Effort: Pulmonary effort is normal. No tachypnea or respiratory distress.     Breath sounds: No wheezing, rhonchi or rales.  Abdominal:     Palpations: Abdomen is soft.     Tenderness: There is no abdominal tenderness.  Musculoskeletal:        General: No swelling.     Cervical back: Neck supple.  Skin:    General: Skin is warm and dry.     Capillary Refill: Capillary refill takes less than 2 seconds.  Neurological:     Mental Status: She is alert.  Psychiatric:        Mood and Affect: Mood normal.     ED Results / Procedures / Treatments   Labs (all labs ordered are listed, but only abnormal results are displayed) Labs Reviewed - No data to display  EKG EKG Interpretation  Date/Time:  Saturday October 29 2021 15:56:28 EDT Ventricular Rate:  86 PR Interval:  130 QRS Duration: 82 QT Interval:  388 QTC Calculation: 464 R Axis:   90 Text Interpretation: Normal sinus rhythm Rightward axis Septal infarct , age undetermined Abnormal ECG Confirmed by Carmin Muskrat [phone]) on 10/29/2021 4:17:05 PM  Radiology DG Chest 2 View  Result Date: 10/29/2021 CLINICAL DATA:  Shortness of breath, cough, wheezing EXAM: CHEST - 2 VIEW COMPARISON:  04/07/2021 FINDINGS: Transverse diameter of heart is slightly increased. Thoracic aorta is tortuous. There are no signs of  pulmonary edema or focal pulmonary consolidation. There is no pleural effusion or pneumothorax. IMPRESSION: No active cardiopulmonary disease. Electronically Signed   By: Elmer Picker M.D.   On: 10/29/2021 16:23    Procedures Procedures  Medications Ordered in ED Medications  predniSONE (DELTASONE) tablet 60 mg (60 mg Oral Given 10/29/21 1621)  doxycycline (VIBRA-TABS) tablet 100 mg (100 mg Oral Given 10/29/21 1622)    ED Course/ Medical Decision Making/ A&P                           Medical Decision Making Amount  and/or Complexity of Data Reviewed Radiology: ordered.  Risk OTC drugs. Prescription drug management.  This patient presents to the ED for concern of shortness of breath, increased productive cough  Co morbidities that complicate the patient evaluation  COPD   Imaging Studies ordered:  I ordered imaging studies including CXR, which showed no acute cardiopulmonary processs    Cardiac Monitoring: / EKG:  The patient was maintained on a cardiac monitor.  I personally viewed and interpreted the cardiac monitored which showed an underlying rhythm of: normal sinus rhythm  Test / Admission - Considered:  Patient is a 76 year old female, here for shortness of breath, productive cough, wheezing, no infectious symptoms such as sore throat, fever.  Symptoms are compatible with a COPD exacerbation.  She did have a DuoNeb prior to coming to the ER about 1 hour prior to seeing provider, she states her wheezing improved, she is not wheezing on exam, she is well-appearing.  Compatible with a COPD exacerbation which was treated with a DuoNeb prior to the ER.  Instructed on return precautions.  Does not require admission.  Advised patient to go to her home and get her oxygen--she states she does not need it anymore, but that she just has at her house and needs to go get it.  Final Clinical Impression(s) / ED Diagnoses Final diagnoses:  COPD exacerbation ([place])    Rx / DC  Orders ED Discharge Orders          Ordered    predniSONE (DELTASONE) 20 MG tablet  Daily        10/29/21 1719    doxycycline (VIBRAMYCIN) 100 MG capsule  2 times daily        10/29/21 1719    guaiFENesin (MUCINEX) 600 MG 12 hr tablet  2 times daily        10/29/21 1719              Thurmond Hildebran, [place], PA 10/29/21 1833    Carmin Muskrat, MD 10/30/21 1825

## 2021-10-29 NOTE — ED Triage Notes (Addendum)
SHOB with wheezing x 3-4 days, denies CP.  Patient has 2L O2 she uses at home prn, pt has been without her O2 for about 1 week

## 2021-10-29 NOTE — Discharge Instructions (Addendum)
Please follow-up with your primary care doctor.  Use your nebulizer as needed up to 4 times a day for shortness of breath.  Please obtain your oxygen tank from your home.  Additionally take your prednisone in the morning to prevent sleeplessness.  If you develop severe shortness of breath chest pain please return to the ER.

## 2021-10-31 ENCOUNTER — Ambulatory Visit (INDEPENDENT_AMBULATORY_CARE_PROVIDER_SITE_OTHER): Payer: Medicare HMO | Admitting: Family Medicine

## 2021-10-31 VITALS — BP 150/90 | HR 88 | Temp 98.7°F | Ht 65.0 in | Wt 159.0 lb

## 2021-10-31 DIAGNOSIS — F5104 Psychophysiologic insomnia: Secondary | ICD-10-CM

## 2021-10-31 DIAGNOSIS — J441 Chronic obstructive pulmonary disease with (acute) exacerbation: Secondary | ICD-10-CM

## 2021-10-31 MED ORDER — ZOLPIDEM TARTRATE 10 MG PO TABS: 5.0000 mg | ORAL_TABLET | Freq: Every evening | ORAL | 1 refills | Status: DC | PRN

## 2021-10-31 NOTE — Progress Notes (Signed)
Subjective:    Patient ID: Mary Blevins, female    DOB: January 14, 1946, 76 y.o.   MRN: [id number]  Was seen in the ER 10/7 and was given bronchodilators, prednisone and doxycycline for copd exacerbation. CXR at that time was clear.  EDP mentions lungs were clear on exam after bronchodilators.  I do not see any lab work drawn in the emergency room.  Patient states she is feeling much better.  She denies any fever or chills.  She denies any purulent sputum.  She denies any hemoptysis or pleurisy.  She denies any chest pain or angina or orthopnea.  There is no pitting edema in her extremities.  Today her lungs are clear to auscultation bilaterally.  She does request a sleeping pill.  She is helping to care for her aunt which is causing her tremendous stress.  She has tried Ambien in the past and had no side effects. Past Medical History:  Diagnosis Date   Arthritis    COPD (chronic obstructive pulmonary disease) (HCC)    Dyspnea    Elevated lipids    Generalized headaches    History of pneumonia    Hyperlipidemia    Hypertension    Hypothyroidism    MVP (mitral valve prolapse)    palpitations   Osteopenia    Seasonal allergies    Sinusitis    Tubular adenoma of colon    Past Surgical History:  Procedure Laterality Date   CATARACT EXTRACTION Left    COLONOSCOPY WITH PROPOFOL N/A 01/06/2016   Procedure: COLONOSCOPY WITH PROPOFOL;  Surgeon: Doran Stabler, MD;  Location: [facility];  Service: Gastroenterology;  Laterality: N/A;   DILATION AND CURETTAGE OF UTERUS     THYROIDECTOMY     TONSILLECTOMY     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   Procedure: RIGHT TOTAL HIP ARTHROPLASTY ANTERIOR APPROACH;  Surgeon: Mcarthur Rossetti, MD;  Location: [facility];  Service: Orthopedics;  Laterality: Right;  Needs RNFA   Current Outpatient Medications on File Prior to Visit  Medication Sig Dispense Refill   albuterol (PROVENTIL) (2.5 MG/3ML) 0.083% nebulizer solution INHALE 3 ML BY  NEBULIZATION EVERY 6 HOURS AS NEEDED FOR WHEEZING OR SHORTNESS OF BREATH 300 mL 3   albuterol (VENTOLIN HFA) 108 (90 Base) MCG/ACT inhaler Inhale 2 puffs into the lungs every 6 (six) hours as needed for wheezing or shortness of breath. 8 g 6   budesonide-formoterol (SYMBICORT) 160-4.5 MCG/ACT inhaler Inhale 2 puffs into the lungs 2 (two) times daily. 6 g 0   doxycycline (VIBRAMYCIN) 100 MG capsule Take 1 capsule (100 mg total) by mouth 2 (two) times daily. 9 capsule 0   DULoxetine (CYMBALTA) 60 MG capsule      fluticasone (FLONASE) 50 MCG/ACT nasal spray SPRAY 2 SPRAYS INTO EACH NOSTRIL EVERY DAY 16 g 2   gabapentin (NEURONTIN) 300 MG capsule Take 300 mg by mouth daily.     guaiFENesin (MUCINEX) 600 MG 12 hr tablet Take 1 tablet (600 mg total) by mouth 2 (two) times daily. 14 tablet 0   HYDROcodone bit-homatropine (HYCODAN) 5-1.5 MG/5ML syrup Take 5 mLs by mouth every 8 (eight) hours as needed for cough. 120 mL 0   HYDROcodone-acetaminophen (NORCO) 7.5-325 MG tablet Take 1 tablet by mouth every 6 (six) hours as needed for moderate pain. 30 tablet 0   hydrOXYzine (ATARAX/VISTARIL) 25 MG tablet Take 1 tablet (25 mg total) by mouth 3 (three) times daily as needed for itching. 30 tablet 6  levothyroxine (SYNTHROID) 50 MCG tablet TAKE 1 TABLET BY MOUTH EVERY DAY BEFORE BREAKFAST 90 tablet 3   montelukast (SINGULAIR) 10 MG tablet TAKE 1 TABLET BY MOUTH EVERYDAY AT BEDTIME 90 tablet 1   predniSONE (DELTASONE) 20 MG tablet Take 2 tablets (40 mg total) by mouth daily. 10 tablet 0   Propylene Glycol-Glycerin 0.6-0.6 % SOLN Place 1 drop into both eyes 2 (two) times daily. Artifical tears     rosuvastatin (CRESTOR) 5 MG tablet TAKE 1 TABLET (5 MG TOTAL) BY MOUTH DAILY. 90 tablet 3   sodium chloride (OCEAN) 0.65 % SOLN nasal spray Place 1 spray into both nostrils daily.     Tiotropium Bromide Monohydrate (SPIRIVA RESPIMAT) 2.5 MCG/ACT AERS Inhale 2 puffs into the lungs daily. 4 g 11   tiZANidine (ZANAFLEX) 4  MG tablet Take 1 tablet (4 mg total) by mouth every 6 (six) hours as needed for muscle spasms. 30 tablet 0   verapamil (CALAN-SR) 120 MG CR tablet TAKE 1 TABLET EVERY DAY 90 tablet 3   No current facility-administered medications on file prior to visit.   Allergies  Allergen Reactions   Oxycodone Other (See Comments)    Hallucinations    Meloxicam Other (See Comments)    BAD DREAMS, SAD THOUGHTS   Social History   Socioeconomic History   Marital status: Widowed    Spouse name: Not on file   Number of children: 0   Years of education: 58 B   Highest education level: Not on file  Occupational History    Employer: [employer]  Tobacco Use   Smoking status: Former    Types: Cigarettes    Quit date: 07/24/2005    Years since quitting: 16.2   Smokeless tobacco: Never  Vaping Use   Vaping Use: Never used  Substance and Sexual Activity   Alcohol use: Yes    Comment: rare   Drug use: No   Sexual activity: Not on file  Other Topics Concern   Not on file  Social History Narrative   Lives alone   Social Determinants of Health   Financial Resource Strain: Low Risk  (07/15/2020)   Overall Financial Resource Strain (CARDIA)    Difficulty of Paying Living Expenses: Not hard at all  Food Insecurity: No Food Insecurity (07/15/2020)   Hunger Vital Sign    Worried About Running Out of Food in the Last Year: Never true    Ran Out of Food in the Last Year: Never true  Transportation Needs: No Transportation Needs (07/15/2020)   PRAPARE - Hydrologist (Medical): No    Lack of Transportation (Non-Medical): No  Physical Activity: Inactive (07/15/2020)   Exercise Vital Sign    Days of Exercise per Week: 0 days    Minutes of Exercise per Session: 0 min  Stress: No Stress Concern Present (07/15/2020)   [place]    Feeling of Stress : Only a little  Social Connections: Moderately  Isolated (07/15/2020)   Social Connection and Isolation Panel [NHANES]    Frequency of Communication with Friends and Family: More than three times a week    Frequency of Social Gatherings with Friends and Family: More than three times a week    Attends Religious Services: More than 4 times per year    Active Member of [employer] or Organizations: No    Attends Archivist Meetings: Never    Marital Status: Widowed  Intimate Partner Violence: Not At Risk (07/15/2020)   Humiliation, Afraid, Rape, and Kick questionnaire    Fear of Current or Ex-Partner: No    Emotionally Abused: No    Physically Abused: No    Sexually Abused: No     Review of Systems  Respiratory:  Positive for cough and shortness of breath.   All other systems reviewed and are negative.      Objective:   Physical Exam Vitals reviewed.  Constitutional:      General: She is not in acute distress.    Appearance: She is well-developed and normal weight. She is not ill-appearing.  Cardiovascular:     Rate and Rhythm: Normal rate and regular rhythm.     Pulses: Normal pulses.     Heart sounds: Normal heart sounds. No murmur heard.    No friction rub. No gallop.  Pulmonary:     Effort: Pulmonary effort is normal. No tachypnea, accessory muscle usage, prolonged expiration or respiratory distress.     Breath sounds: Normal breath sounds and air entry. No stridor, decreased air movement or transmitted upper airway sounds. No decreased breath sounds, wheezing, rhonchi or rales.  Musculoskeletal:     Right lower leg: No edema.     Left lower leg: No edema.  Neurological:     Mental Status: She is alert.           Assessment & Plan:   COPD with acute exacerbation ([place])  Chronic insomnia I feel the patient is back to her baseline.  Her lungs sound excellent.  She is already on triple therapy.  I recommended that she complete the doxycycline and the prednisone but I do not feel further medication is necessary.   I will give the patient Ambien 5 to 10 mg p.o. nightly as needed insomnia

## 2021-11-03 ENCOUNTER — Telehealth: Payer: Self-pay

## 2021-11-03 NOTE — Telephone Encounter (Signed)
     Patient  visit on 10/7  at [place] you been able to follow up with your primary care physician? yes  The patient was or was not able to obtain any needed medicine or equipment. yes  Are there diet recommendations that you are having difficulty following? na  Patient expresses understanding of discharge instructions and education provided has no other needs at this time.  yes     [place], Care Management  [phone] [address], [place], [place] [address] Phone: [phone] Email: [email or web address]

## 2021-11-06 ENCOUNTER — Other Ambulatory Visit: Payer: Self-pay | Admitting: Family Medicine

## 2021-11-07 NOTE — Telephone Encounter (Signed)
Requested medication (s) are due for refill today - yes  Requested medication (s) are on the active medication list -yes  Future visit scheduled -no  Last refill: Hydroxyzine 10/13/20 #30 6RF- expired Rx                  Tizanidine 10/04/21 #30- non delegated Rx  Notes to clinic: see above  Requested Prescriptions  Pending Prescriptions Disp Refills   tiZANidine (ZANAFLEX) 4 MG tablet [Pharmacy Med Name: TIZANIDINE HCL 4 MG TABLET] 30 tablet 0    Sig: TAKE 1 TABLET BY MOUTH EVERY 6 HOURS AS NEEDED FOR MUSCLE SPASMS.     Not Delegated - Cardiovascular:  Alpha-2 Agonists - tizanidine Failed - 11/06/2021  5:08 PM      Failed - This refill cannot be delegated      Failed - Valid encounter within last 6 months    Recent Outpatient Visits           7 months ago COPD with acute exacerbation ([place])   [place] [name], MD   7 months ago COPD with acute exacerbation ([place])   [place] [facility] [name], MD   7 months ago COPD with acute exacerbation [facility])   [place] [name], MD   7 months ago Acute pain of left knee   [place] [name], MD   1 year ago Mixed simple and mucopurulent chronic bronchitis ([place])   [place], Jessica A, NP               hydrOXYzine (ATARAX) 25 MG tablet [Pharmacy Med Name: HYDROXYZINE HCL 25 MG TABLET] 30 tablet 6    Sig: TAKE 1 TABLET BY MOUTH 3 TIMES DAILY AS NEEDED FOR ITCHING.     Ear, Nose, and Throat:  Antihistamines 2 Passed - 11/06/2021  5:08 PM      Passed - Cr in normal range and within 360 days    Creat  Date Value Ref Range Status  08/04/2020 0.78 0.60 - 1.00 mg/dL Final   Creatinine, Ser  Date Value Ref Range Status  04/07/2021 0.84 0.44 - 1.00 mg/dL Final         Passed - Valid encounter within last 12 months    Recent Outpatient Visits           7 months ago COPD with acute exacerbation ([place])    [place] [name], MD   7 months ago COPD with acute exacerbation ([place])   [place] [name], MD   7 months ago COPD with acute exacerbation ([place])   [place] [name], MD   7 months ago Acute pain of left knee   [place] [name], MD   1 year ago Mixed simple and mucopurulent chronic bronchitis ([place])   [facility] Eulogio Bear, NP                 Requested Prescriptions  Pending Prescriptions Disp Refills   tiZANidine (ZANAFLEX) 4 MG tablet [Pharmacy Med Name: TIZANIDINE HCL 4 MG TABLET] 30 tablet 0    Sig: TAKE 1 TABLET BY MOUTH EVERY 6 HOURS AS NEEDED FOR MUSCLE SPASMS.     Not Delegated - Cardiovascular:  Alpha-2 Agonists - tizanidine Failed - 11/06/2021  5:08 PM      Failed - This refill  cannot be delegated      Failed - Valid encounter within last 6 months    Recent Outpatient Visits           7 months ago COPD with acute exacerbation ([place])   [place] [name], MD   7 months ago COPD with acute exacerbation [facility])   [place] [name], MD   7 months ago COPD with acute exacerbation [facility])   [place] [name], MD   7 months ago Acute pain of left knee   [place] [name], MD   1 year ago Mixed simple and mucopurulent chronic bronchitis ([place])   [facility] Eulogio Bear, NP               hydrOXYzine (ATARAX) 25 MG tablet [Pharmacy Med Name: HYDROXYZINE HCL 25 MG TABLET] 30 tablet 6    Sig: TAKE 1 TABLET BY MOUTH 3 TIMES DAILY AS NEEDED FOR ITCHING.     Ear, Nose, and Throat:  Antihistamines 2 Passed - 11/06/2021  5:08 PM      Passed - Cr in normal range and within 360 days    Creat  Date Value Ref Range Status  08/04/2020 0.78 0.60 - 1.00 mg/dL Final   Creatinine, Ser  Date Value Ref  Range Status  04/07/2021 0.84 0.44 - 1.00 mg/dL Final         Passed - Valid encounter within last 12 months    Recent Outpatient Visits           7 months ago COPD with acute exacerbation ([place])   [place] [name], MD   7 months ago COPD with acute exacerbation ([place])   [place] [name], MD   7 months ago COPD with acute exacerbation [facility])   [place] [name], MD   7 months ago Acute pain of left knee   [place] [name], MD   1 year ago Mixed simple and mucopurulent chronic bronchitis ([place])   [place] Eulogio Bear, NP

## 2021-11-10 ENCOUNTER — Telehealth: Payer: Self-pay | Admitting: Pulmonary Disease

## 2021-11-10 ENCOUNTER — Other Ambulatory Visit: Payer: Self-pay

## 2021-11-10 ENCOUNTER — Telehealth: Payer: Self-pay | Admitting: Family Medicine

## 2021-11-10 MED ORDER — ALBUTEROL SULFATE HFA 108 (90 BASE) MCG/ACT IN AERS: 2.0000 | INHALATION_SPRAY | Freq: Four times a day (QID) | RESPIRATORY_TRACT | 6 refills | Status: DC | PRN

## 2021-11-10 NOTE — Telephone Encounter (Signed)
[facility] for [facility] Nothing further needed

## 2021-11-10 NOTE — Telephone Encounter (Signed)
Patient called to request refill of rescue inhaler; stated she just went to use it and is completely out of medication. Patient requesting refill be sent in today (under a lot of stress)  [facility]   [id number] [place] [address] [id number] Phone:  [phone] Fax:  [phone] DEA #:  [id number] Please advise at [phone]

## 2021-11-28 ENCOUNTER — Other Ambulatory Visit: Payer: Self-pay | Admitting: Family Medicine

## 2021-11-28 NOTE — Telephone Encounter (Signed)
Requested medication (s) are due for refill today - provider review   Requested medication (s) are on the active medication list -yes  Future visit scheduled -no  Last refill: 11/07/21 #30   Notes to clinic: non delegated Rx  Requested Prescriptions  Pending Prescriptions Disp Refills   tiZANidine (ZANAFLEX) 4 MG tablet [Pharmacy Med Name: TIZANIDINE HCL 4 MG TABLET] 30 tablet 0    Sig: TAKE 1 TABLET BY MOUTH EVERY 6 HOURS AS NEEDED FOR MUSCLE SPASMS.     Not Delegated - Cardiovascular:  Alpha-2 Agonists - tizanidine Failed - 11/28/2021 12:07 PM      Failed - This refill cannot be delegated      Failed - Valid encounter within last 6 months    Recent Outpatient Visits           7 months ago COPD with acute exacerbation ([place])   [place] [name], MD   8 months ago COPD with acute exacerbation ([place])   [facility] [name], MD   8 months ago COPD with acute exacerbation [facility])   [place] [name], MD   8 months ago Acute pain of left knee   [place] [name], MD   1 year ago Mixed simple and mucopurulent chronic bronchitis ([place])   [facility] Eulogio Bear, NP                 Requested Prescriptions  Pending Prescriptions Disp Refills   tiZANidine (ZANAFLEX) 4 MG tablet [Pharmacy Med Name: TIZANIDINE HCL 4 MG TABLET] 30 tablet 0    Sig: TAKE 1 TABLET BY MOUTH EVERY 6 HOURS AS NEEDED FOR MUSCLE SPASMS.     Not Delegated - Cardiovascular:  Alpha-2 Agonists - tizanidine Failed - 11/28/2021 12:07 PM      Failed - This refill cannot be delegated      Failed - Valid encounter within last 6 months    Recent Outpatient Visits           7 months ago COPD with acute exacerbation ([place])   [place] [name], MD   8 months ago COPD with acute exacerbation [facility])   [facility] [name], MD    8 months ago COPD with acute exacerbation [facility])   [facility] [name], MD   8 months ago Acute pain of left knee   [place] [name], MD   1 year ago Mixed simple and mucopurulent chronic bronchitis ([place])   [place] Eulogio Bear, NP

## 2021-11-29 ENCOUNTER — Telehealth: Payer: Self-pay | Admitting: Pulmonary Disease

## 2021-11-29 NOTE — Telephone Encounter (Signed)
Spiriva samples placed up front. ATC patient to notify and line was answered but no response on other end. Nothing further needed

## 2021-11-30 ENCOUNTER — Telehealth: Payer: Self-pay | Admitting: Pharmacist

## 2021-11-30 IMAGING — DX DG CHEST 2V
2 series · 2 of 2 positions shown · non-contrast
Comparison: Chest radiograph 01/06/2018

CLINICAL DATA: Shortness of breath

EXAM:
CHEST - 2 VIEW

[chest pa]
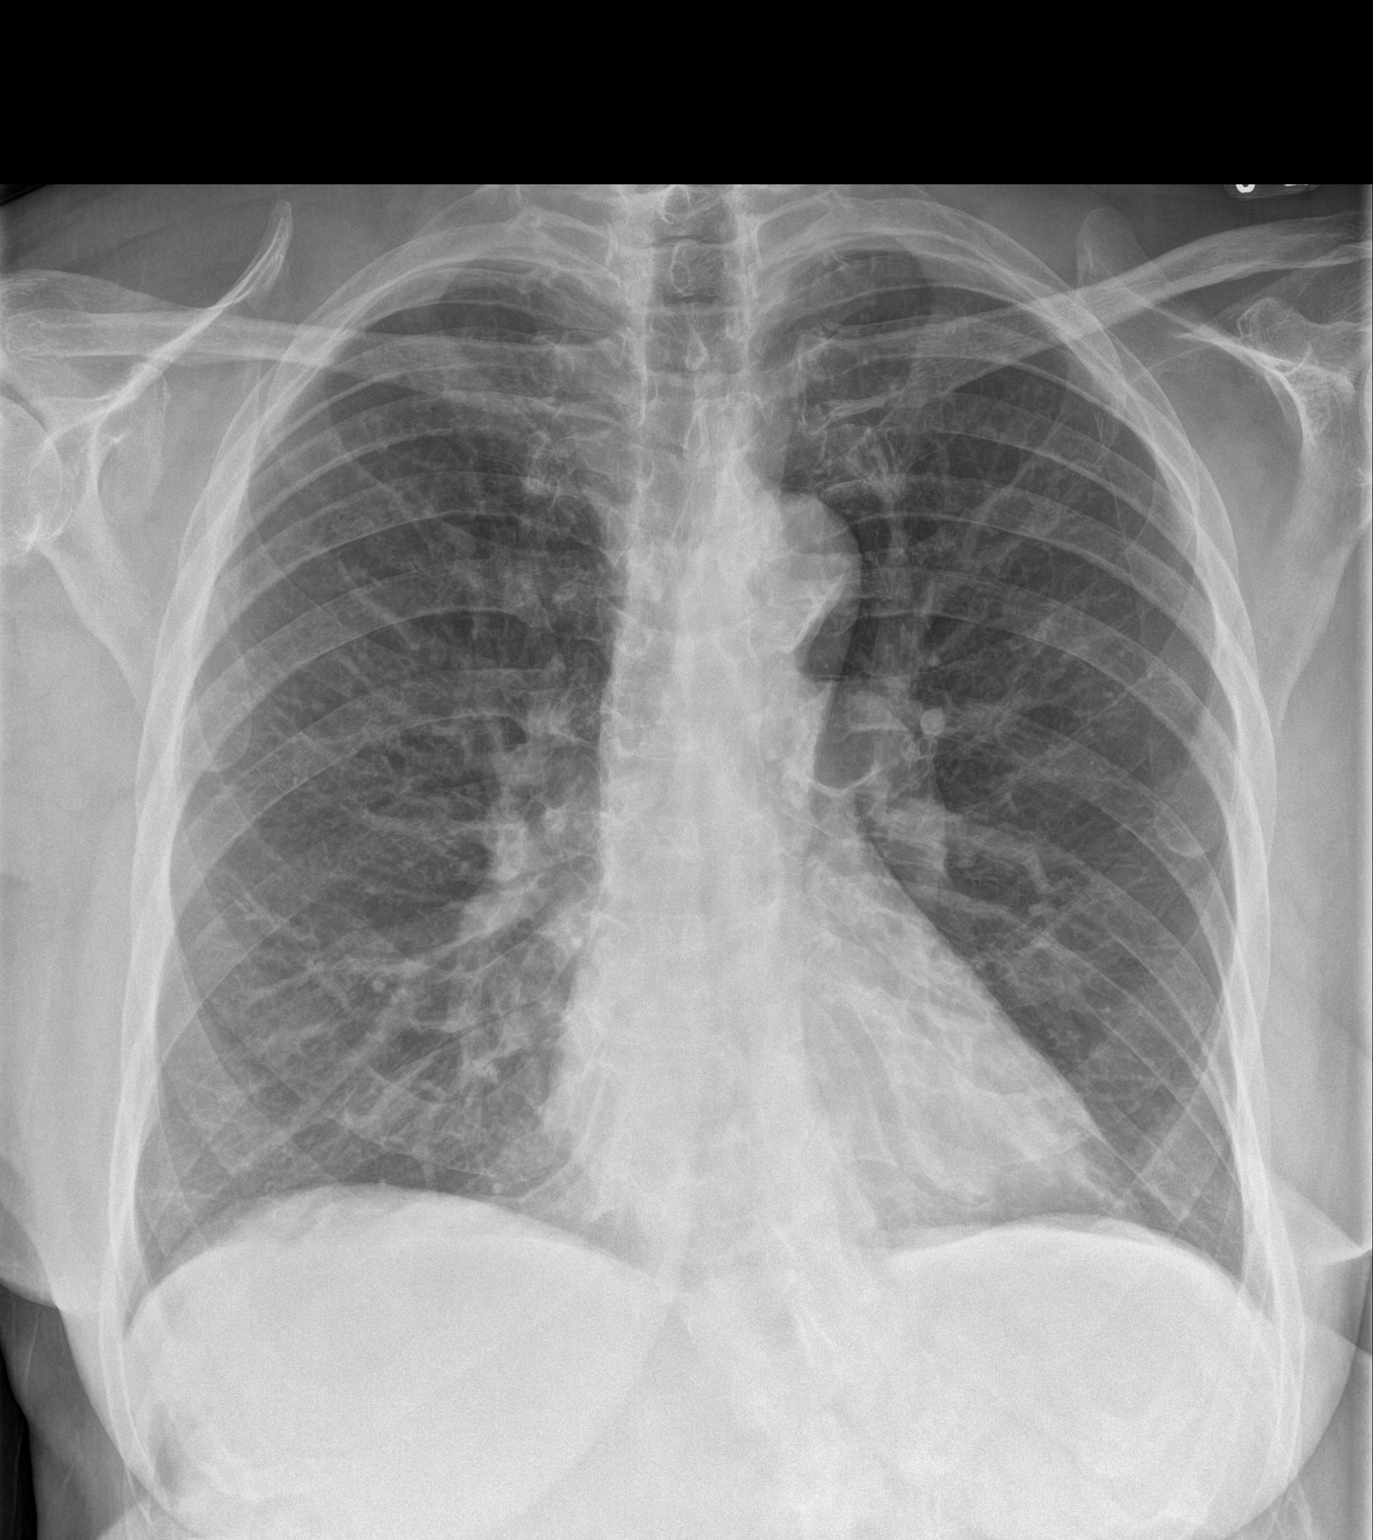

[chest lat]
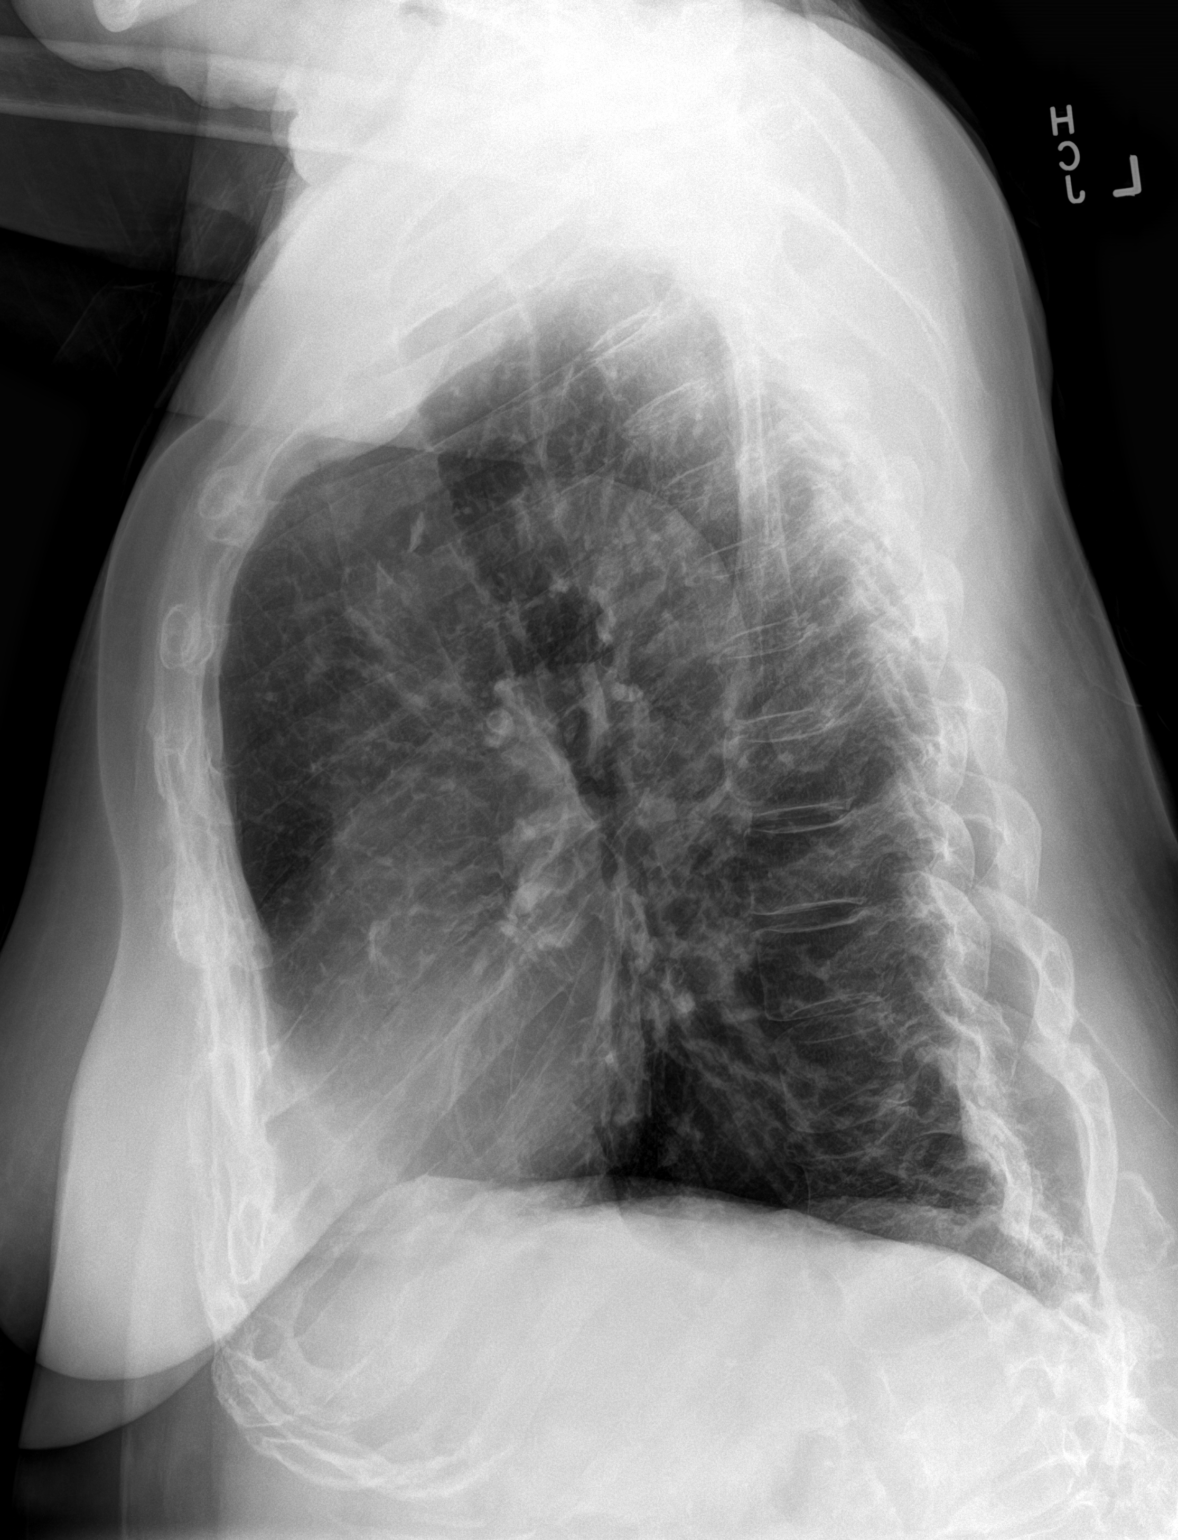

[2 of 2 positions shown; findings below may reference images not displayed]

FINDINGS: The cardiomediastinal silhouette is within normal limits. There is
calcified atherosclerotic plaque of the aortic arch.

The lungs are hyperinflated with flattening of the diaphragms
consistent with the history of COPD. Streaky opacities in the lung
bases likely reflect subsegmental atelectasis. There is no focal
consolidation or pulmonary edema. There is no pleural effusion or
pneumothorax.

There is mild compression deformity of a lower thoracic vertebral
body, likely T12 as seen on the prior lumbar spine MRI. There is no
acute osseous abnormality.
IMPRESSION: 1. No radiographic evidence of acute cardiopulmonary process.
2. Hyperinflation consistent with COPD.

## 2021-11-30 NOTE — Progress Notes (Signed)
Chronic Care Management Pharmacy Assistant   Name: Mary Blevins  MRN: [id number] DOB: 05-20-1945   Reason for Encounter: Disease State - General Adherence Call     Recent office visits:  10/31/21 Mary Luo MD - Family Medicine - COPD - zolpidem (AMBIEN) 10 MG tablet  prescribed. Follow up as scheduled.   Recent consult visits:  None noted.      Hospital visits: 10/29/21 - 10/31/21 Medication Reconciliation was completed by comparing discharge summary, patient's EMR and Pharmacy list, and upon discussion with patient.   Admitted to the hospital on 10/29/21 due to COPD. Discharge date was 10/31/21. Discharged from [facility]?Medications Started at [facility] Discharge:?? doxycycline 100 MG capsule guaifenesin 600 MG 12 hr tablet prednisone 20 MG tablet  Medication Changes at Hospital Discharge: None noted   Medications Discontinued at Hospital Discharge: None noted   Medications that remain the same after Hospital Discharge:??  All other medications will remain the same.    Medications: Outpatient Encounter Medications as of 11/30/2021  Medication Sig   albuterol (PROVENTIL) (2.5 MG/3ML) 0.083% nebulizer solution INHALE 3 ML BY NEBULIZATION EVERY 6 HOURS AS NEEDED FOR WHEEZING OR SHORTNESS OF BREATH   albuterol (VENTOLIN HFA) 108 (90 Base) MCG/ACT inhaler Inhale 2 puffs into the lungs every 6 (six) hours as needed for wheezing or shortness of breath.   budesonide-formoterol (SYMBICORT) 160-4.5 MCG/ACT inhaler Inhale 2 puffs into the lungs 2 (two) times daily.   doxycycline (VIBRAMYCIN) 100 MG capsule Take 1 capsule (100 mg total) by mouth 2 (two) times daily.   DULoxetine (CYMBALTA) 60 MG capsule    fluticasone (FLONASE) 50 MCG/ACT nasal spray SPRAY 2 SPRAYS INTO EACH NOSTRIL EVERY DAY   gabapentin (NEURONTIN) 300 MG capsule Take 300 mg by mouth daily.   guaiFENesin (MUCINEX) 600 MG 12 hr tablet Take 1 tablet (600 mg total) by mouth 2 (two) times  daily.   HYDROcodone bit-homatropine (HYCODAN) 5-1.5 MG/5ML syrup Take 5 mLs by mouth every 8 (eight) hours as needed for cough.   HYDROcodone-acetaminophen (NORCO) 7.5-325 MG tablet Take 1 tablet by mouth every 6 (six) hours as needed for moderate pain.   hydrOXYzine (ATARAX) 25 MG tablet TAKE 1 TABLET BY MOUTH 3 TIMES DAILY AS NEEDED FOR ITCHING.   levothyroxine (SYNTHROID) 50 MCG tablet TAKE 1 TABLET BY MOUTH EVERY DAY BEFORE BREAKFAST   montelukast (SINGULAIR) 10 MG tablet TAKE 1 TABLET BY MOUTH EVERYDAY AT BEDTIME   predniSONE (DELTASONE) 20 MG tablet Take 2 tablets (40 mg total) by mouth daily.   Propylene Glycol-Glycerin 0.6-0.6 % SOLN Place 1 drop into both eyes 2 (two) times daily. Artifical tears   rosuvastatin (CRESTOR) 5 MG tablet TAKE 1 TABLET (5 MG TOTAL) BY MOUTH DAILY.   sodium chloride (OCEAN) 0.65 % SOLN nasal spray Place 1 spray into both nostrils daily.   Tiotropium Bromide Monohydrate (SPIRIVA RESPIMAT) 2.5 MCG/ACT AERS Inhale 2 puffs into the lungs daily.   tiZANidine (ZANAFLEX) 4 MG tablet TAKE 1 TABLET BY MOUTH EVERY 6 HOURS AS NEEDED FOR MUSCLE SPASMS.   verapamil (CALAN-SR) 120 MG CR tablet TAKE 1 TABLET EVERY DAY   zolpidem (AMBIEN) 10 MG tablet Take 0.5 tablets (5 mg total) by mouth at bedtime as needed for sleep.   No facility-administered encounter medications on file as of 11/30/2021.    Reviewed chart for medication changes ahead of medication coordination call.  No OVs, Consults, or hospital visits since last care coordination call/Pharmacist  visit. (If appropriate, list visit date, provider name)  No medication changes indicated OR if recent visit, treatment plan here.  BP Readings from Last 3 Encounters:  10/31/21 (!) 150/90  10/29/21 (!) 145/94  07/01/21 128/74    Lab Results  Component Value Date   HGBA1C 5.6 08/04/2020     Contacted Mary Blevins for General Review Call   Chart Review:  Have there been any documented new, changed, or  discontinued medications since last visit? No (If yes, include name, dose, frequency, date) Has there been any documented recent hospitalizations or ED visits since last visit with Clinical Pharmacist? Yes Brief Summary (including medication and/or Diagnosis changes):   Adherence Review:  Does the Clinical Pharmacist Assistant have access to adherence rates? Yes Adherence rates for STAR metric medications (List medication(s)/day supply/ last 2 fill dates). Adherence rates for medications indicated for disease state being reviewed (List medication(s)/day supply/ last 2 fill dates). Does the patient have >5 day gap between last estimated fill dates for any of the above medications or other medication gaps? No Reason for medication gaps.   Disease State Questions:  Able to connect with Patient? Yes Did patient have any problems with their health recently? No Note problems and Concerns: Have you had any admissions or emergency room visits or worsening of your condition(s) since last visit? Yes Details of ED visit, hospital visit and/or worsening condition(s): Have you had any visits with new specialists or providers since your last visit? No Explain: Have you had any new health care problem(s) since your last visit? No New problem(s) reported: Have you run out of any of your medications since you last spoke with clinical pharmacist? No What caused you to run out of your medications? Are there any medications you are not taking as prescribed? No What kept you from taking your medications as prescribed? Are you having any issues or side effects with your medications? No Note of issues or side effects: Do you have any other health concerns or questions you want to discuss with your Clinical Pharmacist before your next visit? No Note additional concerns and questions from Patient. Are there any health concerns that you feel we can do a better job addressing? No Note Patient's response. Are  you having any problems with any of the following since the last visit: (select all that apply)  None  Details: 12. Any falls since last visit? No  Details: 13. Any increased or uncontrolled pain since last visit? No  Details: 14. Next visit Type: None scheduled       Visit with:        Date:        Time:  69. Additional Details? No    Care Gaps   AWV: overdue Colonoscopy: done 01/06/16 DM Eye Exam: N/A DM Foot Exam: N/A Microalbumin: N/A HbgAIC:  N/A DEXA: done 07/21/16 Mammogram: done 01/09/20     Star Rating Drugs: No Star Rating Drugs noted    No future appointments.   Jobe Gibbon, [facility] Clinical Pharmacist Assistant  [phone]

## 2021-12-11 ENCOUNTER — Other Ambulatory Visit: Payer: Self-pay | Admitting: Family Medicine

## 2021-12-13 ENCOUNTER — Other Ambulatory Visit: Payer: Self-pay

## 2021-12-13 MED ORDER — MONTELUKAST SODIUM 10 MG PO TABS: ORAL_TABLET | ORAL | 0 refills | Status: DC

## 2021-12-13 NOTE — Telephone Encounter (Signed)
  Prescription Request  12/13/2021  Is this a "Controlled Substance" medicine? No  LOV: 11/30/2021   What is the name of the medication or equipment? montelukast (SINGULAIR) 10 MG tablet [[id number]]   Have you contacted your pharmacy to request a refill? No   Which pharmacy would you like this sent to?  [id number] [place] [place] [address]Phone: [phone]: [phone]  Patient notified that their request is being sent to the clinical staff for review and that they should receive a response within 2 business days.   Please advise at [phone]

## 2021-12-13 NOTE — Telephone Encounter (Signed)
Requested Prescriptions  Pending Prescriptions Disp Refills   montelukast (SINGULAIR) 10 MG tablet 90 tablet 0    Sig: TAKE 1 TABLET BY MOUTH EVERYDAY AT BEDTIME     Pulmonology:  Leukotriene Inhibitors Passed - 12/13/2021 10:51 AM      Passed - Valid encounter within last 12 months    Recent Outpatient Visits           8 months ago COPD with acute exacerbation ([place])   [place] [name], MD   8 months ago COPD with acute exacerbation ([place])   [place] [name], MD   8 months ago COPD with acute exacerbation [facility])   [name], MD   9 months ago Acute pain of left knee   [place] [name], MD   1 year ago Mixed simple and mucopurulent chronic bronchitis ([place])   [place] Eulogio Bear, NP

## 2021-12-29 ENCOUNTER — Ambulatory Visit (INDEPENDENT_AMBULATORY_CARE_PROVIDER_SITE_OTHER): Payer: Medicare HMO

## 2021-12-29 VITALS — Ht 65.0 in | Wt 159.0 lb

## 2021-12-29 DIAGNOSIS — Z0000 Encounter for general adult medical examination without abnormal findings: Secondary | ICD-10-CM | POA: Diagnosis not present

## 2021-12-29 NOTE — Patient Instructions (Signed)
Mary Blevins , Thank you for taking time to come for your Medicare Wellness Visit. I appreciate your ongoing commitment to your health goals. Please review the following plan we discussed and let me know if I can assist you in the future.   These are the goals we discussed:  Goals      Exercise 3x per week (30 min per time)     Awaiting hip pain to resolve so that she may [facility] the [facility]     Prevent falls     Track and Manage My Symptoms-COPD     Timeframe:  Long-Range Goal Priority:  High Start Date: 12/23/20                            Expected End Date: 06/23/21                      Follow Up Date 03/23/21    - develop a rescue plan - eliminate symptom triggers at home - follow rescue plan if symptoms flare-up    Why is this important?   Tracking your symptoms and other information about your health helps your doctor plan your care.  Write down the symptoms, the time of day, what you were doing and what medicine you are taking.  You will soon learn how to manage your symptoms.     Notes:         This is a list of the screening recommended for you and due dates:  Health Maintenance  Topic Date Due   DTaP/Tdap/Td vaccine (3 - Td or Tdap) 08/11/2020   COVID-19 Vaccine (5 - 2023-24 season) 09/23/2021   Zoster (Shingles) Vaccine (2 of 2) 01/31/2022*   Hepatitis C Screening: USPSTF Recommendation to screen - Ages 18-79 yo.  11/01/2022*   Medicare Annual Wellness Visit  12/30/2022   Pneumonia Vaccine  Completed   Flu Shot  Completed   DEXA scan (bone density measurement)  Completed   HPV Vaccine  Aged Out   Mammogram  Discontinued   Colon Cancer Screening  Discontinued  *Topic was postponed. The date shown is not the original due date.    Advanced directives: Advance directive discussed with you today. I have provided a copy for you to complete at home and have notarized. Once this is complete please bring a copy in to our office so we can scan it into  your chart.   Conditions/risks identified: Aim for 30 minutes of exercise or brisk walking, 6-8 glasses of water, and 5 servings of fruits and vegetables each day.   Next appointment: Follow up in one year for your annual wellness visit    Preventive Care 65 Years and Older, Female Preventive care refers to lifestyle choices and visits with your health care provider that can promote health and wellness. What does preventive care include? A yearly physical exam. This is also called an annual well check. Dental exams once or twice a year. Routine eye exams. Ask your health care provider how often you should have your eyes checked. Personal lifestyle choices, including: Daily care of your teeth and gums. Regular physical activity. Eating a healthy diet. Avoiding tobacco and drug use. Limiting alcohol use. Practicing safe sex. Taking low-dose aspirin every day. Taking vitamin and mineral supplements as recommended by your health care provider. What happens during an annual well check? The services and screenings done by your health care provider during  your annual well check will depend on your age, overall health, lifestyle risk factors, and family history of disease. Counseling  Your health care provider may ask you questions about your: Alcohol use. Tobacco use. Drug use. Emotional well-being. Home and relationship well-being. Sexual activity. Eating habits. History of falls. Memory and ability to understand (cognition). Work and work Statistician. Reproductive health. Screening  You may have the following tests or measurements: Height, weight, and BMI. Blood pressure. Lipid and cholesterol levels. These may be checked every 5 years, or more frequently if you are over 24 years old. Skin check. Lung cancer screening. You may have this screening every year starting at age 10 if you have a 30-pack-year history of smoking and currently smoke or have quit within the past 15  years. Fecal occult blood test (FOBT) of the stool. You may have this test every year starting at age 32. Flexible sigmoidoscopy or colonoscopy. You may have a sigmoidoscopy every 5 years or a colonoscopy every 10 years starting at age 56. Hepatitis C blood test. Hepatitis B blood test. Sexually transmitted disease (STD) testing. Diabetes screening. This is done by checking your blood sugar (glucose) after you have not eaten for a while (fasting). You may have this done every 1-3 years. Bone density scan. This is done to screen for osteoporosis. You may have this done starting at age 42. Mammogram. This may be done every 1-2 years. Talk to your health care provider about how often you should have regular mammograms. Talk with your health care provider about your test results, treatment options, and if necessary, the need for more tests. Vaccines  Your health care provider may recommend certain vaccines, such as: Influenza vaccine. This is recommended every year. Tetanus, diphtheria, and acellular pertussis (Tdap, Td) vaccine. You may need a Td booster every 10 years. Zoster vaccine. You may need this after age 21. Pneumococcal 13-valent conjugate (PCV13) vaccine. One dose is recommended after age 36. Pneumococcal polysaccharide (PPSV23) vaccine. One dose is recommended after age 86. Talk to your health care provider about which screenings and vaccines you need and how often you need them. This information is not intended to replace advice given to you by your health care provider. Make sure you discuss any questions you have with your health care provider. Document Released: 02/05/2015 Document Revised: 09/29/2015 Document Reviewed: 11/10/2014 Elsevier Interactive Patient Education  2017 [place] Prevention in the Home Falls can cause injuries. They can happen to people of all ages. There are many things you can do to make your home safe and to help prevent falls. What can I do on  the outside of my home? Regularly fix the edges of walkways and driveways and fix any cracks. Remove anything that might make you trip as you walk through a door, such as a raised step or threshold. Trim any bushes or trees on the path to your home. Use bright outdoor lighting. Clear any walking paths of anything that might make someone trip, such as rocks or tools. Regularly check to see if handrails are loose or broken. Make sure that both sides of any steps have handrails. Any raised decks and porches should have guardrails on the edges. Have any leaves, snow, or ice cleared regularly. Use sand or salt on walking paths during winter. Clean up any spills in your garage right away. This includes oil or grease spills. What can I do in the bathroom? Use night lights. Install grab bars by the toilet and  in the tub and shower. Do not use towel bars as grab bars. Use non-skid mats or decals in the tub or shower. If you need to sit down in the shower, use a plastic, non-slip stool. Keep the floor dry. Clean up any water that spills on the floor as soon as it happens. Remove soap buildup in the tub or shower regularly. Attach bath mats securely with double-sided non-slip rug tape. Do not have throw rugs and other things on the floor that can make you trip. What can I do in the bedroom? Use night lights. Make sure that you have a light by your bed that is easy to reach. Do not use any sheets or blankets that are too big for your bed. They should not hang down onto the floor. Have a firm chair that has side arms. You can use this for support while you get dressed. Do not have throw rugs and other things on the floor that can make you trip. What can I do in the kitchen? Clean up any spills right away. Avoid walking on wet floors. Keep items that you use a lot in easy-to-reach places. If you need to reach something above you, use a strong step stool that has a grab bar. Keep electrical cords out  of the way. Do not use floor polish or wax that makes floors slippery. If you must use wax, use non-skid floor wax. Do not have throw rugs and other things on the floor that can make you trip. What can I do with my stairs? Do not leave any items on the stairs. Make sure that there are handrails on both sides of the stairs and use them. Fix handrails that are broken or loose. Make sure that handrails are as long as the stairways. Check any carpeting to make sure that it is firmly attached to the stairs. Fix any carpet that is loose or worn. Avoid having throw rugs at the top or bottom of the stairs. If you do have throw rugs, attach them to the floor with carpet tape. Make sure that you have a light switch at the top of the stairs and the bottom of the stairs. If you do not have them, ask someone to add them for you. What else can I do to help prevent falls? Wear shoes that: Do not have high heels. Have rubber bottoms. Are comfortable and fit you well. Are closed at the toe. Do not wear sandals. If you use a stepladder: Make sure that it is fully opened. Do not climb a closed stepladder. Make sure that both sides of the stepladder are locked into place. Ask someone to hold it for you, if possible. Clearly mark and make sure that you can see: Any grab bars or handrails. First and last steps. Where the edge of each step is. Use tools that help you move around (mobility aids) if they are needed. These include: Canes. Walkers. Scooters. Crutches. Turn on the lights when you go into a dark area. Replace any light bulbs as soon as they burn out. Set up your furniture so you have a clear path. Avoid moving your furniture around. If any of your floors are uneven, fix them. If there are any pets around you, be aware of where they are. Review your medicines with your doctor. Some medicines can make you feel dizzy. This can increase your chance of falling. Ask your doctor what other things that  you can do to help prevent falls. This  information is not intended to replace advice given to you by your health care provider. Make sure you discuss any questions you have with your health care provider. Document Released: 11/05/2008 Document Revised: 06/17/2015 Document Reviewed: 02/13/2014 Elsevier Interactive Patient Education  2017 [employer].

## 2021-12-29 NOTE — Progress Notes (Signed)
Subjective:   Mary Blevins is a 76 y.o. female who presents for Medicare Annual (Subsequent) preventive examination.  I connected with  Mary Blevins on 12/29/21 by a audio enabled telemedicine application and verified that I am speaking with the correct person using two identifiers.  Patient Location: Home  Provider Location: Office/Clinic  I discussed the limitations of evaluation and management by telemedicine. The patient expressed understanding and agreed to proceed.   Review of Systems     Cardiac Risk Factors include: advanced age (>37mn, >>19women);hypertension;sedentary lifestyle     Objective:    Today's Vitals   12/29/21 1004  Weight: 159 lb (72.1 kg)  Height: '5\' 5"'$  (1.651 m)   Body mass index is 26.46 kg/m.     12/29/2021   10:19 AM 10/29/2021    3:58 PM 04/07/2021    4:50 PM 03/21/2021   10:26 AM 03/18/2021    8:03 AM 01/18/2021   11:51 AM 09/24/2020    5:00 PM  Advanced Directives  Does Patient Have a Medical Advance Directive? No No No No No No No  Would patient like information on creating a medical advance directive? Yes (MAU/Ambulatory/Procedural Areas - Information given) No - Patient declined No - Patient declined No - Patient declined No - Patient declined  No - Patient declined    Current Medications (verified) Outpatient Encounter Medications as of 12/29/2021  Medication Sig   albuterol (PROVENTIL) (2.5 MG/3ML) 0.083% nebulizer solution INHALE 3 ML BY NEBULIZATION EVERY 6 HOURS AS NEEDED FOR WHEEZING OR SHORTNESS OF BREATH   albuterol (VENTOLIN HFA) 108 (90 Base) MCG/ACT inhaler Inhale 2 puffs into the lungs every 6 (six) hours as needed for wheezing or shortness of breath.   budesonide-formoterol (SYMBICORT) 160-4.5 MCG/ACT inhaler Inhale 2 puffs into the lungs 2 (two) times daily.   DULoxetine (CYMBALTA) 60 MG capsule    fluticasone (FLONASE) 50 MCG/ACT nasal spray SPRAY 2 SPRAYS INTO EACH NOSTRIL EVERY DAY   gabapentin (NEURONTIN)  300 MG capsule Take 300 mg by mouth daily.   guaiFENesin (MUCINEX) 600 MG 12 hr tablet Take 1 tablet (600 mg total) by mouth 2 (two) times daily.   HYDROcodone bit-homatropine (HYCODAN) 5-1.5 MG/5ML syrup Take 5 mLs by mouth every 8 (eight) hours as needed for cough.   HYDROcodone-acetaminophen (NORCO) 7.5-325 MG tablet Take 1 tablet by mouth every 6 (six) hours as needed for moderate pain.   hydrOXYzine (ATARAX) 25 MG tablet TAKE 1 TABLET BY MOUTH 3 TIMES DAILY AS NEEDED FOR ITCHING.   levothyroxine (SYNTHROID) 50 MCG tablet TAKE 1 TABLET BY MOUTH EVERY DAY BEFORE BREAKFAST   montelukast (SINGULAIR) 10 MG tablet TAKE 1 TABLET BY MOUTH EVERYDAY AT BEDTIME   Propylene Glycol-Glycerin 0.6-0.6 % SOLN Place 1 drop into both eyes 2 (two) times daily. Artifical tears   rosuvastatin (CRESTOR) 5 MG tablet TAKE 1 TABLET (5 MG TOTAL) BY MOUTH DAILY.   sodium chloride (OCEAN) 0.65 % SOLN nasal spray Place 1 spray into both nostrils daily.   Tiotropium Bromide Monohydrate (SPIRIVA RESPIMAT) 2.5 MCG/ACT AERS Inhale 2 puffs into the lungs daily.   tiZANidine (ZANAFLEX) 4 MG tablet TAKE 1 TABLET BY MOUTH EVERY 6 HOURS AS NEEDED FOR MUSCLE SPASMS.   verapamil (CALAN-SR) 120 MG CR tablet TAKE 1 TABLET EVERY DAY   zolpidem (AMBIEN) 10 MG tablet Take 0.5 tablets (5 mg total) by mouth at bedtime as needed for sleep.   doxycycline (VIBRAMYCIN) 100 MG capsule Take 1 capsule (100 mg total)  by mouth 2 (two) times daily. (Patient not taking: Reported on 12/29/2021)   predniSONE (DELTASONE) 20 MG tablet Take 2 tablets (40 mg total) by mouth daily. (Patient not taking: Reported on 12/29/2021)   No facility-administered encounter medications on file as of 12/29/2021.    Allergies (verified) Oxycodone and Meloxicam   History: Past Medical History:  Diagnosis Date   Arthritis    COPD (chronic obstructive pulmonary disease) (HCC)    Dyspnea    Elevated lipids    Generalized headaches    History of pneumonia     Hyperlipidemia    Hypertension    Hypothyroidism    MVP (mitral valve prolapse)    palpitations   Osteopenia    Seasonal allergies    Sinusitis    Tubular adenoma of colon    Past Surgical History:  Procedure Laterality Date   CATARACT EXTRACTION Left    COLONOSCOPY WITH PROPOFOL N/A 01/06/2016   Procedure: COLONOSCOPY WITH PROPOFOL;  Surgeon: Doran Stabler, MD;  Location: [facility];  Service: Gastroenterology;  Laterality: N/A;   DILATION AND CURETTAGE OF UTERUS     THYROIDECTOMY     TONSILLECTOMY     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   Procedure: RIGHT TOTAL HIP ARTHROPLASTY ANTERIOR APPROACH;  Surgeon: Mcarthur Rossetti, MD;  Location: [facility];  Service: Orthopedics;  Laterality: Right;  Needs RNFA   Family History  Problem Relation Age of Onset   Heart attack Mother    Heart disease Mother    Dementia Mother    Healthy Father    Healthy Sister    Diabetes Brother    Heart attack Maternal Grandmother    Prostate cancer Maternal Grandfather    Breast cancer Maternal Aunt    Heart attack Other        NIECE   Colon cancer Neg Hx    Esophageal cancer Neg Hx    Rectal cancer Neg Hx    Stomach cancer Neg Hx    Social History   Socioeconomic History   Marital status: Widowed    Spouse name: Not on file   Number of children: 0   Years of education: 55 B   Highest education level: Not on file  Occupational History    Employer: [employer]  Tobacco Use   Smoking status: Former    Types: Cigarettes    Quit date: 07/24/2005    Years since quitting: 16.4   Smokeless tobacco: Never  Vaping Use   Vaping Use: Never used  Substance and Sexual Activity   Alcohol use: Yes    Comment: rare   Drug use: No   Sexual activity: Not on file  Other Topics Concern   Not on file  Social History Narrative   Lives alone   Social Determinants of Health   Financial Resource Strain: Low Risk  (12/29/2021)   Overall Financial Resource Strain (CARDIA)     Difficulty of Paying Living Expenses: Not hard at all  Food Insecurity: No Food Insecurity (12/29/2021)   Hunger Vital Sign    Worried About Running Out of Food in the Last Year: Never true    [place] in the Last Year: Never true  Transportation Needs: No Transportation Needs (12/29/2021)   PRAPARE - Hydrologist (Medical): No    Lack of Transportation (Non-Medical): No  Physical Activity: Unknown (12/29/2021)   Exercise Vital Sign    Days of Exercise per Week: 0 days  Minutes of Exercise per Session: Not on file  Stress: No Stress Concern Present (12/29/2021)   [place]    Feeling of Stress : Not at all  Social Connections: Moderately Integrated (12/29/2021)   Social Connection and Isolation Panel [NHANES]    Frequency of Communication with Friends and Family: More than three times a week    Frequency of Social Gatherings with Friends and Family: Three times a week    Attends Religious Services: More than 4 times per year    Active Member of Clubs or Organizations: Yes    Attends Archivist Meetings: More than 4 times per year    Marital Status: Widowed    Tobacco Counseling Counseling given: Not Answered   Clinical Intake:  Pre-visit preparation completed: Yes  Pain : No/denies pain     Diabetes: No  How often do you need to have someone help you when you read instructions, pamphlets, or other written materials from your doctor or pharmacy?: 1 - Never  Diabetic?No   Interpreter Needed?: No  Information entered by :: Denman George LPN   Activities of Daily Living    12/29/2021   10:19 AM  In your present state of health, do you have any difficulty performing the following activities:  Hearing? 0  Vision? 0  Difficulty concentrating or making decisions? 0  Walking or climbing stairs? 0  Dressing or bathing? 0  Doing errands, shopping? 0   Preparing Food and eating ? N  Using the Toilet? N  In the past six months, have you accidently leaked urine? N  Do you have problems with loss of bowel control? N  Managing your Medications? N  Managing your Finances? N  Housekeeping or managing your Housekeeping? N    Patient Care Team: [name], MD as PCP - General (Family Medicine) Belva Crome, MD as PCP - Cardiology (Cardiology) Edythe Clarity, [facility] as Pharmacist (Pharmacist)  Indicate any recent Medical Services you may have received from other than [facility] providers in the past year (date may be approximate).     Assessment:   This is a routine wellness examination for [facility].  Hearing/Vision screen Hearing Screening - Comments:: No concerns  Vision Screening - Comments:: Wears rx glasses - up to date with routine eye exams with Dr. Valetta Close    Dietary issues and exercise activities discussed: Current Exercise Habits: The patient does not participate in regular exercise at present   Goals Addressed             This Visit's Progress    Prevent falls   On track     Depression Screen    12/29/2021   10:18 AM 10/31/2021    3:40 PM 07/15/2020   10:02 AM 05/03/2020   10:38 AM 03/10/2019    2:07 PM 01/31/2018   12:24 PM 01/24/2017    9:55 AM  PHQ 2/9 Scores  PHQ - 2 Score 0 1 2 0 0 0 0  PHQ- 9 Score '1 3 5    '$ 0    Fall Risk    12/29/2021   10:20 AM 10/31/2021    3:32 PM 07/15/2020   10:01 AM 05/03/2020   10:38 AM 03/10/2019    2:07 PM  Fall Risk   Falls in the past year? 0 0 0 0 0  Number falls in past yr: 0 0 0    Injury with Fall? 0 0 0  Risk for fall due to : Impaired balance/gait  Orthopedic patient Impaired balance/gait;Impaired mobility   Follow up Falls evaluation completed;Education provided;Falls prevention discussed  Falls evaluation completed;Falls prevention discussed Falls evaluation completed Falls evaluation completed    FALL RISK PREVENTION PERTAINING TO THE HOME:  Any stairs in or  around the home? No  If so, are there any without handrails? No  Home free of loose throw rugs in walkways, pet beds, electrical cords, etc? Yes  Adequate lighting in your home to reduce risk of falls? Yes   ASSISTIVE DEVICES UTILIZED TO PREVENT FALLS:  Life alert? No  Use of a cane, walker or w/c? Yes  Grab bars in the bathroom? Yes  Shower chair or bench in shower? Yes  Elevated toilet seat or a handicapped toilet? Yes   TIMED UP AND GO:  Was the test performed? No .  Length of time to ambulate 10 feet: telephonic visit    Cognitive Function:        12/29/2021   10:19 AM  6CIT Screen  What Year? 0 points  What month? 0 points  What time? 0 points  Count back from 20 0 points  Months in reverse 0 points  Repeat phrase 2 points  Total Score 2 points    Immunizations Immunization History  Administered Date(s) Administered   Fluad Quad(high Dose 65+) 10/08/2018, 10/16/2019, 11/18/2020, 10/07/2021   Hepatitis B 12/06/1990, 01/03/1991, 07/07/1991   Influenza Split 10/17/2013   Influenza Whole 10/23/2008   Influenza, High Dose Seasonal PF 09/26/2016, 10/12/2017   Influenza,inj,Quad PF,6+ Mos 10/29/2012, 10/28/2015   Influenza,inj,quad, With Preservative 10/30/2018   Influenza-Unspecified 10/05/2014   PFIZER(Purple Top)SARS-COV-2 Vaccination 06/09/2019, 06/30/2019, 02/06/2020   Pfizer Covid-19 Vaccine Bivalent Booster 53yr & up 12/04/2020   Pneumococcal Conjugate-13 03/23/2013   Pneumococcal Polysaccharide-23 12/21/2005, 07/25/2010   Td 03/13/1996   Tdap 08/12/2010   Zoster Recombinat (Shingrix) 03/30/2021   Zoster, Live 07/12/2010    TDAP status: Due, Education has been provided regarding the importance of this vaccine. Advised may receive this vaccine at local pharmacy or Health Dept. Aware to provide a copy of the vaccination record if obtained from local pharmacy or Health Dept. Verbalized acceptance and understanding.  Flu Vaccine status: Up to  date  Pneumococcal vaccine status: Up to date  Covid-19 vaccine status: Information provided on how to obtain vaccines.   Qualifies for Shingles Vaccine? Yes   Zostavax completed No   Shingrix Completed?: No.    Education has been provided regarding the importance of this vaccine. Patient has been advised to call insurance company to determine out of pocket expense if they have not yet received this vaccine. Advised may also receive vaccine at local pharmacy or Health Dept. Verbalized acceptance and understanding.  Screening Tests Health Maintenance  Topic Date Due   DTaP/Tdap/Td (3 - Td or Tdap) 08/11/2020   COVID-19 Vaccine (5 - 2023-24 season) 09/23/2021   Zoster Vaccines- Shingrix (2 of 2) 01/31/2022 (Originally 05/25/2021)   Hepatitis C Screening  11/01/2022 (Originally 01/30/1963)   Medicare Annual Wellness (AWV)  12/30/2022   Pneumonia Vaccine 76 Years old  Completed   INFLUENZA VACCINE  Completed   DEXA SCAN  Completed   HPV VACCINES  Aged Out   MAMMOGRAM  Discontinued   COLONOSCOPY (Pts 45-470yrInsurance coverage will need to be confirmed)  Discontinued    Health Maintenance  Health Maintenance Due  Topic Date Due   DTaP/Tdap/Td (3 - Td or Tdap) 08/11/2020   COVID-19 Vaccine (5 -  2023-24 season) 09/23/2021    Colorectal cancer screening: No longer required.   Mammogram status: No longer required due to age .  Bone Density status: Completed 07/21/16. Results reflect: Bone density results: OSTEOPENIA. Repeat every 2 years.  Lung Cancer Screening: (Low Dose CT Chest recommended if Age 26-80 years, 30 pack-year currently smoking OR have quit w/in 15years.) does not qualify.   Lung Cancer Screening Referral: n/a  Additional Screening:  Hepatitis C Screening: does qualify; Completed at next office visit   Vision Screening: Recommended annual ophthalmology exams for early detection of glaucoma and other disorders of the eye. Is the patient up to date with their annual  eye exam?  Yes  Who is the provider or what is the name of the office in which the patient attends annual eye exams? Dr. Valetta Close  If pt is not established with a provider, would they like to be referred to a provider to establish care? No .   Dental Screening: Recommended annual dental exams for proper oral hygiene  Community Resource Referral / Chronic Care Management: CRR required this visit?  No   CCM required this visit?  No      Plan:     I have personally reviewed and noted the following in the patient's chart:   Medical and social history Use of alcohol, tobacco or illicit drugs  Current medications and supplements including opioid prescriptions. Patient is currently taking opioid prescriptions. Information provided to patient regarding non-opioid alternatives. Patient advised to discuss non-opioid treatment plan with their provider. Functional ability and status Nutritional status Physical activity Advanced directives List of other physicians Hospitalizations, surgeries, and ER visits in previous 12 months Vitals Screenings to include cognitive, depression, and falls Referrals and appointments  In addition, I have reviewed and discussed with patient certain preventive protocols, quality metrics, and best practice recommendations. A written personalized care plan for preventive services as well as general preventive health recommendations were provided to patient.     Vanetta Mulders, LPN   17/04/[id number]   Due to this being a virtual visit, the after visit summary with patients personalized plan was offered to patient via mail or my-chart. Per request, patient was mailed a copy of AVS  Nurse Notes: No concerns

## 2022-01-03 ENCOUNTER — Other Ambulatory Visit: Payer: Self-pay | Admitting: Family Medicine

## 2022-01-03 NOTE — Telephone Encounter (Signed)
PT upset a whole box of SYMBICORT showed up ate her door and she does not want it. Please call to advise why she got it and how she can return it. TY.

## 2022-01-04 ENCOUNTER — Telehealth: Payer: Self-pay | Admitting: Pulmonary Disease

## 2022-01-04 ENCOUNTER — Other Ambulatory Visit: Payer: Self-pay

## 2022-01-04 MED ORDER — SPIRIVA RESPIMAT 2.5 MCG/ACT IN AERS: 2.0000 | INHALATION_SPRAY | Freq: Every day | RESPIRATORY_TRACT | 11 refills | Status: AC

## 2022-01-04 NOTE — Telephone Encounter (Signed)
Called and spoke with patient, advised refills of spiriva have ben sent to pharmacy. Patient verbalized understanding. Nothing further needed.

## 2022-01-06 ENCOUNTER — Telehealth: Payer: Self-pay

## 2022-01-06 NOTE — Telephone Encounter (Signed)
Pt called in to ask nurse about a med that was delivered to her and not sure what they are for. Pt would like a cb from nurse please. Please advise.  Cb#: [phone]

## 2022-01-06 NOTE — Telephone Encounter (Signed)
Patient states received wrong medication. Patient phone number is [phone].

## 2022-01-06 NOTE — Telephone Encounter (Signed)
Spoke with the pt  She states she received a box from unknown pharmacy that contained "thermosafe polar packs"  I advised we did not send in anything of that nature, and she needs to call the place the package was delivered from  She states that she was able to pick up her spiriva refills we sent to [facility] for her recently  Nothing further needed

## 2022-01-07 ENCOUNTER — Emergency Department (HOSPITAL_COMMUNITY)
Admission: EM | Admit: 2022-01-07 | Discharge: 2022-01-07 | Disposition: A | Discharge status: Home / Self Care | Payer: Medicare HMO | Attending: Emergency Medicine | Admitting: Emergency Medicine

## 2022-01-07 ENCOUNTER — Other Ambulatory Visit: Payer: Self-pay

## 2022-01-07 ENCOUNTER — Emergency Department (HOSPITAL_COMMUNITY): Payer: Medicare HMO

## 2022-01-07 ENCOUNTER — Encounter (HOSPITAL_COMMUNITY): Payer: Self-pay

## 2022-01-07 DIAGNOSIS — Z79899 Other long term (current) drug therapy: Secondary | ICD-10-CM | POA: Insufficient documentation

## 2022-01-07 DIAGNOSIS — R42 Dizziness and giddiness: Secondary | ICD-10-CM | POA: Diagnosis not present

## 2022-01-07 DIAGNOSIS — R1111 Vomiting without nausea: Secondary | ICD-10-CM | POA: Diagnosis not present

## 2022-01-07 DIAGNOSIS — R110 Nausea: Secondary | ICD-10-CM | POA: Diagnosis not present

## 2022-01-07 DIAGNOSIS — Z7951 Long term (current) use of inhaled steroids: Secondary | ICD-10-CM | POA: Diagnosis not present

## 2022-01-07 DIAGNOSIS — R4182 Altered mental status, unspecified: Secondary | ICD-10-CM | POA: Diagnosis not present

## 2022-01-07 DIAGNOSIS — Z20822 Contact with and (suspected) exposure to covid-19: Secondary | ICD-10-CM | POA: Diagnosis not present

## 2022-01-07 DIAGNOSIS — J449 Chronic obstructive pulmonary disease, unspecified: Secondary | ICD-10-CM | POA: Insufficient documentation

## 2022-01-07 LAB — CBC WITH DIFFERENTIAL/PLATELET
Abs Immature Granulocytes: 0.06 10*3/uL (ref 0.00–0.07)
Basophils Absolute: 0 10*3/uL (ref 0.0–0.1)
Basophils Relative: 0 %
Eosinophils Absolute: 0.1 10*3/uL (ref 0.0–0.5)
Eosinophils Relative: 1 %
HCT: 42.8 % (ref 36.0–46.0)
Hemoglobin: 14 g/dL (ref 12.0–15.0)
Immature Granulocytes: 1 %
Lymphocytes Relative: 17 %
Lymphs Abs: 2.2 10*3/uL (ref 0.7–4.0)
MCH: 28.6 pg (ref 26.0–34.0)
MCHC: 32.7 g/dL (ref 30.0–36.0)
MCV: 87.3 fL (ref 80.0–100.0)
Monocytes Absolute: 0.7 10*3/uL (ref 0.1–1.0)
Monocytes Relative: 6 %
Neutro Abs: 9.9 10*3/uL — ABNORMAL HIGH (ref 1.7–7.7)
Neutrophils Relative %: 75 %
Platelets: 316 10*3/uL (ref 150–400)
RBC: 4.9 MIL/uL (ref 3.87–5.11)
RDW: 13.3 % (ref 11.5–15.5)
WBC: 13.1 10*3/uL — ABNORMAL HIGH (ref 4.0–10.5)
nRBC: 0 % (ref 0.0–0.2)

## 2022-01-07 LAB — COMPREHENSIVE METABOLIC PANEL
ALT: 19 U/L (ref 0–44)
AST: 25 U/L (ref 15–41)
Albumin: 3.6 g/dL (ref 3.5–5.0)
Alkaline Phosphatase: 55 U/L (ref 38–126)
Anion gap: 8 (ref 5–15)
BUN: 15 mg/dL (ref 8–23)
CO2: 26 mmol/L (ref 22–32)
Calcium: 8.6 mg/dL — ABNORMAL LOW (ref 8.9–10.3)
Chloride: 102 mmol/L (ref 98–111)
Creatinine, Ser: 0.8 mg/dL (ref 0.44–1.00)
GFR, Estimated: >60 mL/min (ref >60)
Glucose, Bld: 182 mg/dL — ABNORMAL HIGH (ref 70–99)
Potassium: 3.6 mmol/L (ref 3.5–5.1)
Sodium: 136 mmol/L (ref 135–145)
Total Bilirubin: 0.7 mg/dL (ref 0.3–1.2)
Total Protein: 6.8 g/dL (ref 6.5–8.1)

## 2022-01-07 LAB — RESP PANEL BY RT-PCR (RSV, FLU A&B, COVID)  RVPGX2
Influenza A by PCR: NEGATIVE
Influenza B by PCR: NEGATIVE
Resp Syncytial Virus by PCR: NEGATIVE
SARS Coronavirus 2 by RT PCR: NEGATIVE

## 2022-01-07 LAB — ETHANOL: Alcohol, Ethyl (B): <10 mg/dL (ref <10)

## 2022-01-07 MED ORDER — MECLIZINE HCL 12.5 MG PO TABS
25.0000 mg | ORAL_TABLET | Freq: Once | ORAL | Status: AC
  Administered 2022-01-07: 25 mg via ORAL
  Filled 2022-01-07: qty 2

## 2022-01-07 MED ORDER — SODIUM CHLORIDE 0.9 % IV BOLUS
500.0000 mL | Freq: Once | INTRAVENOUS | Status: AC
  Administered 2022-01-07: 500 mL via INTRAVENOUS

## 2022-01-07 MED ORDER — ACETAMINOPHEN 500 MG PO TABS: 500.0000 mg | ORAL_TABLET | Freq: Once | ORAL | Status: DC

## 2022-01-07 NOTE — Discharge Instructions (Signed)
As discussed, your evaluation today has been largely reassuring.  But, it is important that you monitor your condition carefully, and do not hesitate to return to the ED if you develop new, or concerning changes in your condition. ? ?Otherwise, please follow-up with your physician for appropriate ongoing care. ? ?

## 2022-01-07 NOTE — ED Notes (Signed)
Patient states that she thinks she took a double dose of her medications, verapamil.

## 2022-01-07 NOTE — ED Provider Notes (Signed)
[facility] Provider Note   CSN: [id number] Arrival date & time: 01/07/22  1138     History  Chief Complaint  Patient presents with   Dizziness    Mary Blevins is a 76 y.o. female.  HPI Patient presents with new dizziness.  She woke this morning, essentially normal but soon thereafter noticed dizziness with any attempted motion.  No vomiting, no weakness anywhere, no vision loss, no confusion.  No history of prior similar symptoms she notes that she was well prior to the onset.    Home Medications Prior to Admission medications   Medication Sig Start Date End Date Taking? Authorizing Provider  albuterol (PROVENTIL) (2.5 MG/3ML) 0.083% nebulizer solution INHALE 3 ML BY NEBULIZATION EVERY 6 HOURS AS NEEDED FOR WHEEZING OR SHORTNESS OF BREATH 06/07/21   [name], MD  albuterol (VENTOLIN HFA) 108 (90 Base) MCG/ACT inhaler Inhale 2 puffs into the lungs every 6 (six) hours as needed for wheezing or shortness of breath. 11/10/21   Chesley Mires, MD  budesonide-formoterol (SYMBICORT) 160-4.5 MCG/ACT inhaler Inhale 2 puffs into the lungs 2 (two) times daily. 10/10/21   Chesley Mires, MD  doxycycline (VIBRAMYCIN) 100 MG capsule Take 1 capsule (100 mg total) by mouth 2 (two) times daily. Patient not taking: Reported on 12/29/2021 10/29/21   Small, Brooke L, PA  DULoxetine (CYMBALTA) 60 MG capsule  11/09/20   [provider]  fluticasone (FLONASE) 50 MCG/ACT nasal spray SPRAY 2 SPRAYS INTO EACH NOSTRIL EVERY DAY 07/11/18   [name], MD  gabapentin (NEURONTIN) 300 MG capsule Take 300 mg by mouth daily. 03/09/21   [provider]  guaiFENesin (MUCINEX) 600 MG 12 hr tablet Take 1 tablet (600 mg total) by mouth 2 (two) times daily. 10/29/21   Small, Brooke L, PA  HYDROcodone bit-homatropine (HYCODAN) 5-1.5 MG/5ML syrup Take 5 mLs by mouth every 8 (eight) hours as needed for cough. 09/27/21   [name], MD  HYDROcodone-acetaminophen  (NORCO) 7.5-325 MG tablet Take 1 tablet by mouth every 6 (six) hours as needed for moderate pain. 10/21/21   [name], MD  hydrOXYzine (ATARAX) 25 MG tablet TAKE 1 TABLET BY MOUTH 3 TIMES DAILY AS NEEDED FOR ITCHING. 11/07/21   [name], MD  levothyroxine (SYNTHROID) 50 MCG tablet TAKE 1 TABLET BY MOUTH EVERY DAY BEFORE BREAKFAST 12/12/21   [name], MD  montelukast (SINGULAIR) 10 MG tablet TAKE 1 TABLET BY MOUTH EVERYDAY AT BEDTIME 12/13/21   [name], MD  predniSONE (DELTASONE) 20 MG tablet Take 2 tablets (40 mg total) by mouth daily. Patient not taking: Reported on 12/29/2021 10/29/21   Small, Brooke L, PA  Propylene Glycol-Glycerin 0.6-0.6 % SOLN Place 1 drop into both eyes 2 (two) times daily. Artifical tears    [provider]  rosuvastatin (CRESTOR) 5 MG tablet TAKE 1 TABLET (5 MG TOTAL) BY MOUTH DAILY. 09/16/21   Belva Crome, MD  sodium chloride (OCEAN) 0.65 % SOLN nasal spray Place 1 spray into both nostrils daily.    [provider]  Tiotropium Bromide Monohydrate (SPIRIVA RESPIMAT) 2.5 MCG/ACT AERS Inhale 2 puffs into the lungs daily. 01/04/22   Chesley Mires, MD  tiZANidine (ZANAFLEX) 4 MG tablet TAKE 1 TABLET BY MOUTH EVERY 6 HOURS AS NEEDED FOR MUSCLE SPASMS. 01/03/22   [name], MD  verapamil (CALAN-SR) 120 MG CR tablet TAKE 1 TABLET EVERY DAY 11/23/20   [name], MD  zolpidem Lorrin Mais)  10 MG tablet Take 0.5 tablets (5 mg total) by mouth at bedtime as needed for sleep. 10/31/21 12/30/21  [name], MD      Allergies    Oxycodone and Meloxicam    Review of Systems   Review of Systems  All other systems reviewed and are negative.   Physical Exam Updated Vital Signs BP 134/80 (BP Location: Right Arm)   Pulse (!) 55   Resp 16   Ht '5\' 5"'$  (1.651 m)   Wt 74.4 kg   SpO2 99%   BMI 27.29 kg/m  Physical Exam Vitals and nursing note reviewed.  Constitutional:      General: She is not in acute distress.     Appearance: She is well-developed.  HENT:     Head: Normocephalic and atraumatic.  Eyes:     Conjunctiva/sclera: Conjunctivae normal.  Cardiovascular:     Rate and Rhythm: Normal rate and regular rhythm.  Pulmonary:     Effort: Pulmonary effort is normal. No respiratory distress.     Breath sounds: Normal breath sounds. No stridor.  Abdominal:     General: There is no distension.  Skin:    General: Skin is warm and dry.  Neurological:     Mental Status: She is alert and oriented to person, place, and time.     Cranial Nerves: No cranial nerve deficit.     Motor: Atrophy present. No weakness, tremor or abnormal muscle tone.  Psychiatric:        Mood and Affect: Mood normal.     ED Results / Procedures / Treatments   Labs (all labs ordered are listed, but only abnormal results are displayed) Labs Reviewed  COMPREHENSIVE METABOLIC PANEL - Abnormal; Notable for the following components:      Result Value   Glucose, Bld 182 (*)    Calcium 8.6 (*)    All other components within normal limits  CBC WITH DIFFERENTIAL/PLATELET - Abnormal; Notable for the following components:   WBC 13.1 (*)    Neutro Abs 9.9 (*)    All other components within normal limits  RESP PANEL BY RT-PCR (RSV, FLU A&B, COVID)  [id number]  ETHANOL    EKG EKG Interpretation  Date/Time:  Saturday January 07 2022 12:25:28 EST Ventricular Rate:  51 PR Interval:  145 QRS Duration: 96 QT Interval:  491 QTC Calculation: 453 R Axis:   79 Text Interpretation: Sinus rhythm Probable anteroseptal infarct, old Borderline repolarization abnormality T wave abnormality Abnormal ECG Confirmed by Carmin Muskrat [phone]) on 01/07/2022 12:33:14 PM  Radiology CT Head Wo Contrast  Result Date: 01/07/2022 CLINICAL DATA:  Mental status change.  Dizziness.  Nausea. EXAM: CT HEAD WITHOUT CONTRAST TECHNIQUE: Contiguous axial images were obtained from the base of the skull through the vertex without intravenous contrast.  RADIATION DOSE REDUCTION: This exam was performed according to the departmental dose-optimization program which includes automated exposure control, adjustment of the mA and/or kV according to patient size and/or use of iterative reconstruction technique. COMPARISON:  None Available. FINDINGS: Brain: Mildly prominent sulci consistent volume loss. Ventricles are unremarkable. No subdural, epidural, or subarachnoid hemorrhage. Cerebellum, brainstem, and basal cisterns are normal no mass effect or midline shift. Mild white matter changes identified. No acute cortical ischemia or infarct. Vascular: No hyperdense vessel or unexpected calcification. Skull: Normal. Negative for fracture or focal lesion. Sinuses/Orbits: No acute finding. Other: No other abnormalities. IMPRESSION: Mild white matter changes. No acute intracranial abnormalities. Electronically Signed   By: Dorise Bullion  III M.D.   On: 01/07/2022 13:48    Procedures Procedures    Medications Ordered in ED Medications  meclizine (ANTIVERT) tablet 25 mg (has no administration in time range)  sodium chloride 0.9 % bolus 500 mL (0 mLs Intravenous Stopped 01/07/22 1318)  meclizine (ANTIVERT) tablet 25 mg (25 mg Oral Given 01/07/22 1234)    ED Course/ Medical Decision Making/ A&P This patient with a Hx of COPD, otherwise generally well presents to the ED for concern of gait onset dizziness, this involves an extensive number of treatment options, and is a complaint that carries with it a high risk of complications and morbidity.    The differential diagnosis includes vertigo, CVA, electrolyte abnormalities, dehydration, infection   Social Determinants of Health:  Advanced age  Additional history obtained:  Additional history and/or information obtained from chart review, notable for ongoing efforts for COPD control   After the initial evaluation, orders, including: CT labs meclizine fluids were initiated.   Patient placed on Cardiac  and Pulse-Oximetry Monitors. The patient was maintained on a cardiac monitor.  The cardiac monitored showed an rhythm of 60 sinus normal The patient was also maintained on pulse oximetry. The readings were typically 100% room air normal   On repeat evaluation of the patient improved 3:11 PM Patient awake, alert, has been ambulatory to the bathroom without apparent difficulty.  She remains in no distress, hemodynamically and on physical exam Lab Tests:  I personally interpreted labs.  The pertinent results include: Unremarkable labs for mild hyperglycemia, no DKA  Imaging Studies ordered:  I independently visualized and interpreted imaging which showed head CT unremarkable I agree with the radiologist interpretation Dispostion / Final MDM:  After consideration of the diagnostic results and the patient's response to treatment, this adult female presents with dizziness, improved here with fluids, appropriate meds.  Evaluation generally reassuring, physical exam without focal neurologic deficiency, CT head without obvious abnormality, labs reassuring, no evidence for infection.  Posterior circulation deficit considered, though the patient has been ambulatory without any difficulty or consistent with vertigo presentation.  Hospitalization consideration, but given her improvement here she was discharged in stable condition to follow-up with primary care in 2 days.  Final Clinical Impression(s) / ED Diagnoses Final diagnoses:  Dizziness    Rx / DC Orders ED Discharge Orders     None         Carmin Muskrat, MD 01/07/22 1511

## 2022-01-07 NOTE — Congregational Nurse Program (Unsigned)
Pt. Stated went to ED c/o weakness and dizzy, stated did not want to stay at present location. Referred relative for the night.

## 2022-01-07 NOTE — ED Triage Notes (Signed)
Patient brought in by [facility] for possible vertigo.  Patient got out of the bed this morning and almost fell.  Patient states that she is very dizzy and nauseated.  Patient received '4mg'$  of zofran with EMS with some relief.  Still states that she feels very dizzy.

## 2022-01-07 NOTE — ED Notes (Signed)
EMS states that the patients house was very cold.  Patient states that she has propane heat and can't afford bill.  States that she does have a space heater.

## 2022-01-11 ENCOUNTER — Telehealth: Payer: Self-pay

## 2022-01-11 NOTE — Telephone Encounter (Signed)
     Patient  visit on 12/16  at [facility] you been able to follow up with your primary care physician? Not yet but will make an appointment  The patient was or was not able to obtain any needed medicine or equipment. Yes     Are there diet recommendations that you are having difficulty following? NA  Patient expresses understanding of discharge instructions and education provided has no other needs at this time. Yes     [facility], [facility], Care Management  [phone] [address], [place], [place] [address] Phone: [phone] Email: [email or web address]

## 2022-01-19 ENCOUNTER — Telehealth: Payer: Self-pay | Admitting: Family Medicine

## 2022-01-19 ENCOUNTER — Other Ambulatory Visit: Payer: Self-pay

## 2022-01-19 ENCOUNTER — Other Ambulatory Visit: Payer: Self-pay | Admitting: Family Medicine

## 2022-01-19 DIAGNOSIS — R200 Anesthesia of skin: Secondary | ICD-10-CM

## 2022-01-19 DIAGNOSIS — M25552 Pain in left hip: Secondary | ICD-10-CM

## 2022-01-19 MED ORDER — GABAPENTIN 300 MG PO CAPS: 300.0000 mg | ORAL_CAPSULE | Freq: Every day | ORAL | 3 refills | Status: DC

## 2022-01-19 NOTE — Telephone Encounter (Signed)
As advised by her pharmacist, patient left a voicemail message to request the blue and white gabapentin capsules;for her refill.  Please advise at [phone].

## 2022-01-20 ENCOUNTER — Other Ambulatory Visit: Payer: Self-pay | Admitting: Family Medicine

## 2022-01-20 ENCOUNTER — Telehealth: Payer: Self-pay | Admitting: Family Medicine

## 2022-01-20 MED ORDER — HYDROCODONE-ACETAMINOPHEN 7.5-325 MG PO TABS: 1.0000 | ORAL_TABLET | Freq: Four times a day (QID) | ORAL | 0 refills | Status: DC | PRN

## 2022-01-20 NOTE — Telephone Encounter (Signed)
Prescription Request  01/20/2022  Is this a "Controlled Substance" medicine? Yes  LOV: 10/31/2021  What is the name of the medication or equipment?   HYDROcodone-acetaminophen (NORCO) 7.5-325 MG tablet [[id number]]   Have you contacted your pharmacy to request a refill? Yes   Which pharmacy would you like this sent to?  [id number] [place] [address] [id number]: [phone]: [phone]   Patient notified that their request is being sent to the clinical staff for review and that they should receive a response within 2 business days.   Please advise at [phone]

## 2022-01-26 ENCOUNTER — Ambulatory Visit (INDEPENDENT_AMBULATORY_CARE_PROVIDER_SITE_OTHER): Payer: Medicare HMO | Admitting: Family Medicine

## 2022-01-26 ENCOUNTER — Encounter: Payer: Self-pay | Admitting: Family Medicine

## 2022-01-26 VITALS — BP 112/68 | HR 76 | Ht 65.0 in | Wt 158.8 lb

## 2022-01-26 DIAGNOSIS — R739 Hyperglycemia, unspecified: Secondary | ICD-10-CM

## 2022-01-26 NOTE — Progress Notes (Signed)
Subjective:    Patient ID: Mary Blevins, female    DOB: [birth year], 77 y.o.   MRN: [id number] Patient was recently seen in the emergency room for dizziness.  She was laying down.  She states that her head was slightly lower than the rest of her body.  When she sat up, the room began to spin, she became violently nauseated and had to vomit.  She felt extremely dizzy and had a difficult time standing.  She became extremely scared and called 911.  In the emergency room they did an EKG that was unremarkable, CT scan of her brain that was unremarkable they did check some labs that showed a mild elevation in her white count of 13 and an elevated blood sugar of 180.  She was diagnosed with vertigo.  She is here today for follow-up.  She has had no further episodes of vertigo.  She has not had no further dizziness.  She does report some occasional wheezing and a dry nonproductive cough but she has COPD.  Today on examination her lungs are completely clear with no wheezes crackles or rales.  Otherwise she is doing well Past Medical History:  Diagnosis Date   Arthritis    COPD (chronic obstructive pulmonary disease) (HCC)    Dyspnea    Elevated lipids    Generalized headaches    History of pneumonia    Hyperlipidemia    Hypertension    Hypothyroidism    MVP (mitral valve prolapse)    palpitations   Osteopenia    Seasonal allergies    Sinusitis    Tubular adenoma of colon    Past Surgical History:  Procedure Laterality Date   CATARACT EXTRACTION Left    COLONOSCOPY WITH PROPOFOL N/A 01/06/2016   Procedure: COLONOSCOPY WITH PROPOFOL;  Surgeon: Doran Stabler, MD;  Location: [facility];  Service: Gastroenterology;  Laterality: N/A;   DILATION AND CURETTAGE OF UTERUS     THYROIDECTOMY     TONSILLECTOMY     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   Procedure: RIGHT TOTAL HIP ARTHROPLASTY ANTERIOR APPROACH;  Surgeon: Mcarthur Rossetti, MD;  Location: [facility];  Service: Orthopedics;   Laterality: Right;  Needs RNFA   Current Outpatient Medications on File Prior to Visit  Medication Sig Dispense Refill   albuterol (PROVENTIL) (2.5 MG/3ML) 0.083% nebulizer solution INHALE 3 ML BY NEBULIZATION EVERY 6 HOURS AS NEEDED FOR WHEEZING OR SHORTNESS OF BREATH 300 mL 3   albuterol (VENTOLIN HFA) 108 (90 Base) MCG/ACT inhaler Inhale 2 puffs into the lungs every 6 (six) hours as needed for wheezing or shortness of breath. 8 g 6   budesonide-formoterol (SYMBICORT) 160-4.5 MCG/ACT inhaler Inhale 2 puffs into the lungs 2 (two) times daily. 6 g 0   DULoxetine (CYMBALTA) 60 MG capsule      fluticasone (FLONASE) 50 MCG/ACT nasal spray SPRAY 2 SPRAYS INTO EACH NOSTRIL EVERY DAY 16 g 2   gabapentin (NEURONTIN) 300 MG capsule Take 1 capsule (300 mg total) by mouth daily. 30 capsule 3   guaiFENesin (MUCINEX) 600 MG 12 hr tablet Take 1 tablet (600 mg total) by mouth 2 (two) times daily. 14 tablet 0   HYDROcodone bit-homatropine (HYCODAN) 5-1.5 MG/5ML syrup Take 5 mLs by mouth every 8 (eight) hours as needed for cough. 120 mL 0   HYDROcodone-acetaminophen (NORCO) 7.5-325 MG tablet Take 1 tablet by mouth every 6 (six) hours as needed for moderate pain. 30 tablet 0   hydrOXYzine (ATARAX) 25 MG  tablet TAKE 1 TABLET BY MOUTH 3 TIMES DAILY AS NEEDED FOR ITCHING. 30 tablet 6   levothyroxine (SYNTHROID) 50 MCG tablet TAKE 1 TABLET BY MOUTH EVERY DAY BEFORE BREAKFAST 90 tablet 3   montelukast (SINGULAIR) 10 MG tablet TAKE 1 TABLET BY MOUTH EVERYDAY AT BEDTIME 90 tablet 0   predniSONE (DELTASONE) 20 MG tablet Take 2 tablets (40 mg total) by mouth daily. 10 tablet 0   rosuvastatin (CRESTOR) 5 MG tablet TAKE 1 TABLET (5 MG TOTAL) BY MOUTH DAILY. 90 tablet 3   sodium chloride (OCEAN) 0.65 % SOLN nasal spray Place 1 spray into both nostrils daily.     Tiotropium Bromide Monohydrate (SPIRIVA RESPIMAT) 2.5 MCG/ACT AERS Inhale 2 puffs into the lungs daily. 4 g 11   tiZANidine (ZANAFLEX) 4 MG tablet TAKE 1 TABLET BY  MOUTH EVERY 6 HOURS AS NEEDED FOR MUSCLE SPASMS. 30 tablet 0   verapamil (CALAN-SR) 120 MG CR tablet TAKE 1 TABLET EVERY DAY 90 tablet 3   zolpidem (AMBIEN) 10 MG tablet Take 0.5 tablets (5 mg total) by mouth at bedtime as needed for sleep. 15 tablet 1   No current facility-administered medications on file prior to visit.   Allergies  Allergen Reactions   Oxycodone Other (See Comments)    Hallucinations    Meloxicam Other (See Comments)    BAD DREAMS, SAD THOUGHTS   Social History   Socioeconomic History   Marital status: Widowed    Spouse name: Not on file   Number of children: 0   Years of education: 89 B   Highest education level: Not on file  Occupational History    Employer: [employer]  Tobacco Use   Smoking status: Former    Types: Cigarettes    Quit date: 07/24/2005    Years since quitting: 16.5   Smokeless tobacco: Never  Vaping Use   Vaping Use: Never used  Substance and Sexual Activity   Alcohol use: Yes    Comment: rare   Drug use: No   Sexual activity: Not on file  Other Topics Concern   Not on file  Social History Narrative   Lives alone   Social Determinants of Health   Financial Resource Strain: Low Risk  (12/29/2021)   Overall Financial Resource Strain (CARDIA)    Difficulty of Paying Living Expenses: Not hard at all  Food Insecurity: No Food Insecurity (12/29/2021)   Hunger Vital Sign    Worried About Running Out of Food in the Last Year: Never true    Stites in the Last Year: Never true  Transportation Needs: No Transportation Needs (12/29/2021)   PRAPARE - Hydrologist (Medical): No    Lack of Transportation (Non-Medical): No  Physical Activity: Unknown (12/29/2021)   Exercise Vital Sign    Days of Exercise per Week: 0 days    Minutes of Exercise per Session: Not on file  Stress: No Stress Concern Present (12/29/2021)   [place]    Feeling of Stress : Not at all  Social Connections: Moderately Integrated (12/29/2021)   Social Connection and Isolation Panel [NHANES]    Frequency of Communication with Friends and Family: More than three times a week    Frequency of Social Gatherings with Friends and Family: Three times a week    Attends Religious Services: More than 4 times per year    Active Member of Clubs or Organizations: Yes  Attends Archivist Meetings: More than 4 times per year    Marital Status: Widowed  Intimate Partner Violence: Not At Risk (12/29/2021)   Humiliation, Afraid, Rape, and Kick questionnaire    Fear of Current or Ex-Partner: No    Emotionally Abused: No    Physically Abused: No    Sexually Abused: No     Review of Systems  Respiratory:  Positive for cough and shortness of breath.   All other systems reviewed and are negative.      Objective:   Physical Exam Vitals reviewed.  Constitutional:      General: She is not in acute distress.    Appearance: She is well-developed and normal weight. She is not ill-appearing.  Cardiovascular:     Rate and Rhythm: Normal rate and regular rhythm.     Pulses: Normal pulses.     Heart sounds: Normal heart sounds. No murmur heard.    No friction rub. No gallop.  Pulmonary:     Effort: Pulmonary effort is normal. No tachypnea, accessory muscle usage, prolonged expiration or respiratory distress.     Breath sounds: Normal breath sounds and air entry. No stridor, decreased air movement or transmitted upper airway sounds. No decreased breath sounds, wheezing, rhonchi or rales.  Musculoskeletal:     Right lower leg: No edema.     Left lower leg: No edema.  Neurological:     Mental Status: She is alert.           Assessment & Plan:   Hyperglycemia - Plan: CBC with Differential/Platelet, BASIC METABOLIC PANEL WITH GFR Patient's symptoms sound consistent with vertigo.  She is asymptomatic now so I feel no further  workup or treatment is necessary.  I will repeat a BMP and a CBC to monitor her leukocytosis and her elevated blood sugar.  If her sugar is elevated again I will add a hemoglobin A1c to evaluate for possible type 2 diabetes.

## 2022-01-27 ENCOUNTER — Telehealth: Payer: Self-pay | Admitting: *Deleted

## 2022-01-27 LAB — BASIC METABOLIC PANEL WITH GFR
BUN: 16 mg/dL (ref 7–25)
CO2: 28 mmol/L (ref 20–32)
Calcium: 9.5 mg/dL (ref 8.6–10.4)
Chloride: 105 mmol/L (ref 98–110)
Creat: 0.83 mg/dL (ref 0.60–1.00)
Glucose, Bld: 93 mg/dL (ref 65–99)
Potassium: 4.5 mmol/L (ref 3.5–5.3)
Sodium: 141 mmol/L (ref 135–146)
eGFR: 73 mL/min/{1.73_m2} (ref >=60)

## 2022-01-27 LAB — CBC WITH DIFFERENTIAL/PLATELET
Absolute Monocytes: 816 cells/uL (ref 200–950)
Basophils Absolute: 19 cells/uL (ref 0–200)
Basophils Relative: 0.2 %
Eosinophils Absolute: 221 cells/uL (ref 15–500)
Eosinophils Relative: 2.3 %
HCT: 42.3 % (ref 35.0–45.0)
Hemoglobin: 14.4 g/dL (ref 11.7–15.5)
Lymphs Abs: 2621 cells/uL (ref 850–3900)
MCH: 28.6 pg (ref 27.0–33.0)
MCHC: 34 g/dL (ref 32.0–36.0)
MCV: 83.9 fL (ref 80.0–100.0)
MPV: 9.5 fL (ref 7.5–12.5)
Monocytes Relative: 8.5 %
Neutro Abs: 5923 cells/uL (ref 1500–7800)
Neutrophils Relative %: 61.7 %
Platelets: 438 10*3/uL — ABNORMAL HIGH (ref 140–400)
RBC: 5.04 10*6/uL (ref 3.80–5.10)
RDW: 12.6 % (ref 11.0–15.0)
Total Lymphocyte: 27.3 %
WBC: 9.6 10*3/uL (ref 3.8–10.8)

## 2022-01-27 MED ORDER — ALBUTEROL SULFATE HFA 108 (90 BASE) MCG/ACT IN AERS: 2.0000 | INHALATION_SPRAY | Freq: Four times a day (QID) | RESPIRATORY_TRACT | 6 refills | Status: AC | PRN

## 2022-01-27 NOTE — Telephone Encounter (Signed)
Rescue inhaler refilled

## 2022-02-06 ENCOUNTER — Other Ambulatory Visit: Payer: Self-pay | Admitting: Family Medicine

## 2022-02-20 ENCOUNTER — Encounter (HOSPITAL_COMMUNITY): Payer: Self-pay | Admitting: *Deleted

## 2022-02-20 ENCOUNTER — Emergency Department (HOSPITAL_COMMUNITY)
Admission: EM | Admit: 2022-02-20 | Discharge: 2022-02-20 | Disposition: A | Discharge status: Home / Self Care | Payer: Medicare HMO | Attending: Emergency Medicine | Admitting: Emergency Medicine

## 2022-02-20 ENCOUNTER — Emergency Department (HOSPITAL_COMMUNITY): Payer: Medicare HMO

## 2022-02-20 ENCOUNTER — Other Ambulatory Visit: Payer: Self-pay

## 2022-02-20 DIAGNOSIS — G5621 Lesion of ulnar nerve, right upper limb: Secondary | ICD-10-CM | POA: Diagnosis not present

## 2022-02-20 DIAGNOSIS — I10 Essential (primary) hypertension: Secondary | ICD-10-CM | POA: Diagnosis not present

## 2022-02-20 DIAGNOSIS — G629 Polyneuropathy, unspecified: Secondary | ICD-10-CM | POA: Diagnosis not present

## 2022-02-20 DIAGNOSIS — E039 Hypothyroidism, unspecified: Secondary | ICD-10-CM | POA: Insufficient documentation

## 2022-02-20 DIAGNOSIS — R200 Anesthesia of skin: Secondary | ICD-10-CM | POA: Diagnosis present

## 2022-02-20 DIAGNOSIS — R079 Chest pain, unspecified: Secondary | ICD-10-CM | POA: Diagnosis not present

## 2022-02-20 DIAGNOSIS — J449 Chronic obstructive pulmonary disease, unspecified: Secondary | ICD-10-CM | POA: Insufficient documentation

## 2022-02-20 NOTE — ED Provider Notes (Signed)
[facility] Provider Note   CSN: [id number] Arrival date & time: 02/20/22  1338     History  Chief Complaint  Patient presents with   Numbness    Mary Blevins is a 77 y.o. female.  HPI Patient presents with numbness in her right little finger and ring finger.  Has coming on over the last months to years.  States little bit worse today.  Sometimes it will cramp up.  Does have pain in the elbow and shoulder at times.  Also at times right upper chest pain.  Not exertional pain.  Sometimes feels as if she fell asleep on the hand.  States she has seen her PCP in the past.  Also at times will have numbness in her right foot but does not associate with the hand.   Past Medical History:  Diagnosis Date   Arthritis    COPD (chronic obstructive pulmonary disease) (HCC)    Dyspnea    Elevated lipids    Generalized headaches    History of pneumonia    Hyperlipidemia    Hypertension    Hypothyroidism    MVP (mitral valve prolapse)    palpitations   Osteopenia    Seasonal allergies    Sinusitis    Tubular adenoma of colon     Home Medications Prior to Admission medications   Medication Sig Start Date End Date Taking? Authorizing Provider  albuterol (PROVENTIL) (2.5 MG/3ML) 0.083% nebulizer solution INHALE 3 ML BY NEBULIZATION EVERY 6 HOURS AS NEEDED FOR WHEEZING OR SHORTNESS OF BREATH 06/07/21   [name], MD  albuterol (VENTOLIN HFA) 108 (90 Base) MCG/ACT inhaler Inhale 2 puffs into the lungs every 6 (six) hours as needed for wheezing or shortness of breath. 01/27/22   Chesley Mires, MD  budesonide-formoterol (SYMBICORT) 160-4.5 MCG/ACT inhaler Inhale 2 puffs into the lungs 2 (two) times daily. 10/10/21   Chesley Mires, MD  DULoxetine (CYMBALTA) 60 MG capsule  11/09/20   [provider]  fluticasone (FLONASE) 50 MCG/ACT nasal spray SPRAY 2 SPRAYS INTO EACH NOSTRIL EVERY DAY 07/11/18   [name], MD  gabapentin  (NEURONTIN) 300 MG capsule Take 1 capsule (300 mg total) by mouth daily. 01/19/22   [name], MD  guaiFENesin (MUCINEX) 600 MG 12 hr tablet Take 1 tablet (600 mg total) by mouth 2 (two) times daily. 10/29/21   Small, Brooke L, PA  HYDROcodone bit-homatropine (HYCODAN) 5-1.5 MG/5ML syrup Take 5 mLs by mouth every 8 (eight) hours as needed for cough. 09/27/21   [name], MD  HYDROcodone-acetaminophen (NORCO) 7.5-325 MG tablet Take 1 tablet by mouth every 6 (six) hours as needed for moderate pain. 01/20/22   [name], MD  hydrOXYzine (ATARAX) 25 MG tablet TAKE 1 TABLET BY MOUTH 3 TIMES DAILY AS NEEDED FOR ITCHING. 11/07/21   [name], MD  levothyroxine (SYNTHROID) 50 MCG tablet TAKE 1 TABLET BY MOUTH EVERY DAY BEFORE BREAKFAST 12/12/21   [name], MD  montelukast (SINGULAIR) 10 MG tablet TAKE 1 TABLET BY MOUTH EVERYDAY AT BEDTIME 12/13/21   [name], MD  predniSONE (DELTASONE) 20 MG tablet Take 2 tablets (40 mg total) by mouth daily. 10/29/21   Small, Brooke L, PA  rosuvastatin (CRESTOR) 5 MG tablet TAKE 1 TABLET (5 MG TOTAL) BY MOUTH DAILY. 09/16/21   Belva Crome, MD  sodium chloride (OCEAN) 0.65 % SOLN nasal spray Place 1 spray into both nostrils  daily.    [provider]  Tiotropium Bromide Monohydrate (SPIRIVA RESPIMAT) 2.5 MCG/ACT AERS Inhale 2 puffs into the lungs daily. 01/04/22   Chesley Mires, MD  tiZANidine (ZANAFLEX) 4 MG tablet TAKE 1 TABLET BY MOUTH EVERY 6 HOURS AS NEEDED FOR MUSCLE SPASMS. 02/07/22   [name], MD  verapamil (CALAN-SR) 120 MG CR tablet TAKE 1 TABLET EVERY DAY 01/19/22   [name], MD  zolpidem (AMBIEN) 10 MG tablet Take 0.5 tablets (5 mg total) by mouth at bedtime as needed for sleep. 10/31/21 12/30/21  [name], MD      Allergies    Oxycodone and Meloxicam    Review of Systems   Review of Systems  Physical Exam Updated Vital Signs BP (!) 156/91 (BP Location: Left Arm)   Pulse 72    Temp 98.6 F (37 C) (Oral)   Resp 16   Ht 5' 5.5" (1.664 m)   Wt 73.5 kg   SpO2 96%   BMI 26.55 kg/m  Physical Exam Vitals and nursing note reviewed.  Eyes:     Pupils: Pupils are equal, round, and reactive to light.  Neck:     Comments: Some tenderness on trapezius on right side.  No midline tenderness. Pulmonary:     Comments: Mild tenderness right anterior chest wall. Chest:     Chest wall: Tenderness present.  Musculoskeletal:     Cervical back: Neck supple.     Comments: Some tenderness over right elbow medially.  No deformity.  Good range of motion.  Neurological:     Mental Status: She is alert and oriented to person, place, and time.     Comments: Sensation and strength intact over right hand.  Strong pulse.  Good range of motion.  Good movement over radial median and ulnar distributions.     ED Results / Procedures / Treatments   Labs (all labs ordered are listed, but only abnormal results are displayed) Labs Reviewed - No data to display  EKG None  Radiology DG Chest 2 View  Result Date: 02/20/2022 CLINICAL DATA:  Right chest pain. EXAM: CHEST - 2 VIEW COMPARISON:  October 29, 2021 FINDINGS: The heart size and mediastinal contours are within normal limits. Both lungs are clear. The visualized skeletal structures are stable. IMPRESSION: No active cardiopulmonary disease. Electronically Signed   By: Abelardo Diesel M.D.   On: 02/20/2022 17:40    Procedures Procedures    Medications Ordered in ED Medications - No data to display  ED Course/ Medical Decision Making/ A&P                             Medical Decision Making Amount and/or Complexity of Data Reviewed Radiology: ordered.   Patient with numbness to right fifth finger and fourth finger.  Comes and goes.  Some tenderness to L.  Differential diagnosis includes mostly ulnar neuropathy.  Or potentially a cervical radiculopathy from further up.  Do not think imaging would really help at this time.  No  trauma.  Did have some right chest tenderness.  Doubt cardiac ischemia.  X-ray done and reassuring.  Potentially musculoskeletal.  Follow-up with PCP as needed and potential follow-up with neurology for other workup such as potentially nerve conduction studies.        Final Clinical Impression(s) / ED Diagnoses Final diagnoses:  Ulnar neuropathy of right upper extremity    Rx / DC Orders ED Discharge Orders  None         Davonna Belling, MD 02/20/22 [phone]

## 2022-02-20 NOTE — ED Triage Notes (Signed)
Pt with right ring and pinky finger are numb, pt with hx of same but worse today.  States numbness comes and goes "for awhile".

## 2022-02-20 NOTE — Discharge Instructions (Signed)
Your ulnar nerve seems irritated.  Follow-up with neurology as needed for the neuropathy.  Your chest x-ray was reassuring and do not appear to have pneumonia or other severe problem in your chest.

## 2022-02-20 NOTE — ED Notes (Signed)
ED Provider at bedside. 

## 2022-02-27 ENCOUNTER — Encounter: Payer: Self-pay | Admitting: Family Medicine

## 2022-02-27 ENCOUNTER — Ambulatory Visit (INDEPENDENT_AMBULATORY_CARE_PROVIDER_SITE_OTHER): Payer: Medicare HMO | Admitting: Family Medicine

## 2022-02-27 VITALS — BP 148/86 | HR 71 | Temp 98.0°F | Ht 65.5 in | Wt 157.0 lb

## 2022-02-27 DIAGNOSIS — G8929 Other chronic pain: Secondary | ICD-10-CM | POA: Diagnosis not present

## 2022-02-27 DIAGNOSIS — M25561 Pain in right knee: Secondary | ICD-10-CM | POA: Diagnosis not present

## 2022-02-27 DIAGNOSIS — G5621 Lesion of ulnar nerve, right upper limb: Secondary | ICD-10-CM | POA: Diagnosis not present

## 2022-02-27 DIAGNOSIS — M25562 Pain in left knee: Secondary | ICD-10-CM

## 2022-02-27 NOTE — Progress Notes (Signed)
Subjective:    Patient ID: Mary Blevins, female    DOB: [birth year], 77 y.o.   MRN: [id number] Patient presents today with 2 concerns.  First she reports numbness in her right fourth and fifth digits every evening.  The numbness and tingling radiates up the ulnar aspect of her forearm.  She denies any neck pain.  She denies any weakness in her grip strength.  She denies any numbness in her first second and third digits.  Today Tinel and Phalen sign are negative. She also reports pain in both of her knees.  She states that her knees buckled due to the pain.  She reports pain with ambulation. Past Medical History:  Diagnosis Date   Arthritis    COPD (chronic obstructive pulmonary disease) (HCC)    Dyspnea    Elevated lipids    Generalized headaches    History of pneumonia    Hyperlipidemia    Hypertension    Hypothyroidism    MVP (mitral valve prolapse)    palpitations   Osteopenia    Seasonal allergies    Sinusitis    Tubular adenoma of colon    Past Surgical History:  Procedure Laterality Date   CATARACT EXTRACTION Left    COLONOSCOPY WITH PROPOFOL N/A 01/06/2016   Procedure: COLONOSCOPY WITH PROPOFOL;  Surgeon: Doran Stabler, MD;  Location: [facility];  Service: Gastroenterology;  Laterality: N/A;   DILATION AND CURETTAGE OF UTERUS     THYROIDECTOMY     TONSILLECTOMY     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   Procedure: RIGHT TOTAL HIP ARTHROPLASTY ANTERIOR APPROACH;  Surgeon: Mcarthur Rossetti, MD;  Location: [facility];  Service: Orthopedics;  Laterality: Right;  Needs RNFA   Current Outpatient Medications on File Prior to Visit  Medication Sig Dispense Refill   albuterol (PROVENTIL) (2.5 MG/3ML) 0.083% nebulizer solution INHALE 3 ML BY NEBULIZATION EVERY 6 HOURS AS NEEDED FOR WHEEZING OR SHORTNESS OF BREATH 300 mL 3   albuterol (VENTOLIN HFA) 108 (90 Base) MCG/ACT inhaler Inhale 2 puffs into the lungs every 6 (six) hours as needed for wheezing or shortness  of breath. 8 g 6   budesonide-formoterol (SYMBICORT) 160-4.5 MCG/ACT inhaler Inhale 2 puffs into the lungs 2 (two) times daily. 6 g 0   DULoxetine (CYMBALTA) 60 MG capsule      fluticasone (FLONASE) 50 MCG/ACT nasal spray SPRAY 2 SPRAYS INTO EACH NOSTRIL EVERY DAY 16 g 2   gabapentin (NEURONTIN) 300 MG capsule Take 1 capsule (300 mg total) by mouth daily. 30 capsule 3   guaiFENesin (MUCINEX) 600 MG 12 hr tablet Take 1 tablet (600 mg total) by mouth 2 (two) times daily. 14 tablet 0   HYDROcodone bit-homatropine (HYCODAN) 5-1.5 MG/5ML syrup Take 5 mLs by mouth every 8 (eight) hours as needed for cough. 120 mL 0   HYDROcodone-acetaminophen (NORCO) 7.5-325 MG tablet Take 1 tablet by mouth every 6 (six) hours as needed for moderate pain. 30 tablet 0   hydrOXYzine (ATARAX) 25 MG tablet TAKE 1 TABLET BY MOUTH 3 TIMES DAILY AS NEEDED FOR ITCHING. 30 tablet 6   levothyroxine (SYNTHROID) 50 MCG tablet TAKE 1 TABLET BY MOUTH EVERY DAY BEFORE BREAKFAST 90 tablet 3   montelukast (SINGULAIR) 10 MG tablet TAKE 1 TABLET BY MOUTH EVERYDAY AT BEDTIME 90 tablet 0   predniSONE (DELTASONE) 20 MG tablet Take 2 tablets (40 mg total) by mouth daily. 10 tablet 0   rosuvastatin (CRESTOR) 5 MG tablet TAKE 1 TABLET (  5 MG TOTAL) BY MOUTH DAILY. 90 tablet 3   sodium chloride (OCEAN) 0.65 % SOLN nasal spray Place 1 spray into both nostrils daily.     Tiotropium Bromide Monohydrate (SPIRIVA RESPIMAT) 2.5 MCG/ACT AERS Inhale 2 puffs into the lungs daily. 4 g 11   tiZANidine (ZANAFLEX) 4 MG tablet TAKE 1 TABLET BY MOUTH EVERY 6 HOURS AS NEEDED FOR MUSCLE SPASMS. 30 tablet 0   verapamil (CALAN-SR) 120 MG CR tablet TAKE 1 TABLET EVERY DAY 90 tablet 3   zolpidem (AMBIEN) 10 MG tablet Take 0.5 tablets (5 mg total) by mouth at bedtime as needed for sleep. 15 tablet 1   No current facility-administered medications on file prior to visit.   Allergies  Allergen Reactions   Oxycodone Other (See Comments)    Hallucinations     Meloxicam Other (See Comments)    BAD DREAMS, SAD THOUGHTS   Social History   Socioeconomic History   Marital status: Widowed    Spouse name: Not on file   Number of children: 0   Years of education: 65 B   Highest education level: Not on file  Occupational History    Employer: [employer]  Tobacco Use   Smoking status: Former    Types: Cigarettes    Quit date: 07/24/2005    Years since quitting: 16.6   Smokeless tobacco: Never  Vaping Use   Vaping Use: Never used  Substance and Sexual Activity   Alcohol use: Yes    Comment: rare   Drug use: No   Sexual activity: Not on file  Other Topics Concern   Not on file  Social History Narrative   Lives alone   Social Determinants of Health   Financial Resource Strain: Low Risk  (12/29/2021)   Overall Financial Resource Strain (CARDIA)    Difficulty of Paying Living Expenses: Not hard at all  Food Insecurity: No Food Insecurity (12/29/2021)   Hunger Vital Sign    Worried About Running Out of Food in the Last Year: Never true    [place] in the Last Year: Never true  Transportation Needs: No Transportation Needs (12/29/2021)   PRAPARE - Hydrologist (Medical): No    Lack of Transportation (Non-Medical): No  Physical Activity: Unknown (12/29/2021)   Exercise Vital Sign    Days of Exercise per Week: 0 days    Minutes of Exercise per Session: Not on file  Stress: No Stress Concern Present (12/29/2021)   [place]    Feeling of Stress : Not at all  Social Connections: Moderately Integrated (12/29/2021)   Social Connection and Isolation Panel [NHANES]    Frequency of Communication with Friends and Family: More than three times a week    Frequency of Social Gatherings with Friends and Family: Three times a week    Attends Religious Services: More than 4 times per year    Active Member of Clubs or Organizations: Yes     Attends Archivist Meetings: More than 4 times per year    Marital Status: Widowed  Intimate Partner Violence: Not At Risk (12/29/2021)   Humiliation, Afraid, Rape, and Kick questionnaire    Fear of Current or Ex-Partner: No    Emotionally Abused: No    Physically Abused: No    Sexually Abused: No     Review of Systems  All other systems reviewed and are negative.  Objective:   Physical Exam Vitals reviewed.  Constitutional:      General: She is not in acute distress.    Appearance: She is well-developed and normal weight. She is not ill-appearing.  Cardiovascular:     Rate and Rhythm: Normal rate and regular rhythm.     Pulses: Normal pulses.     Heart sounds: Normal heart sounds. No murmur heard.    No friction rub. No gallop.  Pulmonary:     Effort: Pulmonary effort is normal. No tachypnea, accessory muscle usage, prolonged expiration or respiratory distress.     Breath sounds: Normal breath sounds and air entry. No stridor, decreased air movement or transmitted upper airway sounds. No decreased breath sounds, wheezing, rhonchi or rales.  Musculoskeletal:     Right hand: Normal. No swelling, deformity or lacerations. Normal range of motion.     Left hand: Normal. No swelling, deformity or lacerations. Normal range of motion.     Right knee: Decreased range of motion. Tenderness present.     Left knee: Decreased range of motion. Tenderness present.     Right lower leg: No edema.     Left lower leg: No edema.  Neurological:     Mental Status: She is alert.           Assessment & Plan:   Ulnar neuropathy of right upper extremity  Chronic pain of both knees I will send the patient for nerve conduction studies to evaluate the cause but I suspect ulnar neuropathy.  Patient would like to see an orthopedist to discuss surgical options if this is proven to be correct due to this pain and numbness and tingling that she can think.  She also requests a cortisone  injection in both knees do not consider.  Explained injected the right knee with 2 cc lidocaine, 2 cc Marcaine, and 2 cc of 40 mg/mL Kenalog.  Patient tolerated this injection without complication.  I then repeated the same injection in her left knee without complication

## 2022-02-28 ENCOUNTER — Telehealth: Payer: Self-pay

## 2022-02-28 NOTE — Telephone Encounter (Signed)
     Patient  visit on 1/29  at [facility] you been able to follow up with your primary care physician? Yes   The patient was or was not able to obtain any needed medicine or equipment. Yes   Are there diet recommendations that you are having difficulty following? Na   Patient expresses understanding of discharge instructions and education provided has no other needs at this time.  Yes      [facility] [phone] [address], [place], [place] [address] Phone: [phone] Email: [email or web address]

## 2022-03-03 ENCOUNTER — Other Ambulatory Visit: Payer: Self-pay

## 2022-03-03 ENCOUNTER — Telehealth: Payer: Self-pay

## 2022-03-03 DIAGNOSIS — I10 Essential (primary) hypertension: Secondary | ICD-10-CM

## 2022-03-03 MED ORDER — HYDROCHLOROTHIAZIDE 25 MG PO TABS: 25.0000 mg | ORAL_TABLET | Freq: Every day | ORAL | 3 refills | Status: AC

## 2022-03-03 NOTE — Progress Notes (Signed)
Call spoke w/pt re: pt's b/p and pcp's recommendations,"BP too high, add hctz 25 mg poqday and recheck in 2 weeks."  Rx sent to pharmacy per pcp.  Pt voiced understanding and nothing further.

## 2022-03-03 NOTE — Telephone Encounter (Signed)
FYI: here is a list of pt's bp for this per your request  Mon: 148/81 Tue.: 160/90 Wed.: 156/89 Thurs.: 164/91 Friday: 160/93

## 2022-03-08 ENCOUNTER — Telehealth: Payer: Self-pay | Admitting: Family Medicine

## 2022-03-08 NOTE — Telephone Encounter (Signed)
Patient left a voicemail message to request a portable O2 tank; stated the one she currently has is going to be picked up.   Please advise patient at [phone].

## 2022-03-10 ENCOUNTER — Other Ambulatory Visit: Payer: Self-pay | Admitting: Family Medicine

## 2022-03-16 ENCOUNTER — Other Ambulatory Visit: Payer: Self-pay | Admitting: Family Medicine

## 2022-03-16 NOTE — Telephone Encounter (Signed)
Requested medications are due for refill today.  Provider to determine  Requested medications are on the active medications list.  yes  Last refill. 02/07/2022 #30  Future visit scheduled.   no  Notes to clinic.  Refill not delegated.    Requested Prescriptions  Pending Prescriptions Disp Refills   tiZANidine (ZANAFLEX) 4 MG tablet [Pharmacy Med Name: TIZANIDINE HCL 4 MG TABLET] 30 tablet 0    Sig: TAKE 1 TABLET BY MOUTH EVERY 6 HOURS AS NEEDED FOR MUSCLE SPASMS.     Not Delegated - Cardiovascular:  Alpha-2 [facility]on[facility] - tizanidine Failed - 03/16/2022  8:10 AM      Failed - This refill cannot be delegated      Failed - Valid encounter within last 6 months    Recent Outpatient Visits           11 months ago COPD with acute exacerbation ([place])   [place] [name], MD   11 months ago COPD with acute exacerbation [facility])   [facility] [name], MD   11 months ago COPD with acute exacerbation [facility])   [facility] [name], MD   1 year ago Acute pain of left knee   [place] [name], MD   1 year ago Mixed simple and mucopurulent chronic bronchitis ([place])   [place] Eulogio Bear, NP

## 2022-04-03 ENCOUNTER — Other Ambulatory Visit: Payer: Self-pay

## 2022-04-03 MED ORDER — HYDROCODONE-ACETAMINOPHEN 7.5-325 MG PO TABS: 1.0000 | ORAL_TABLET | Freq: Four times a day (QID) | ORAL | 0 refills | Status: DC | PRN

## 2022-04-03 NOTE — Telephone Encounter (Signed)
Requesting: HYDROcodone-acetaminophen ([place]) 7.5-325 MG tablet Q6H Contract: n/a UDS: n/a   Last Visit: 02/27/21 Next Visit: pending  Last Refill: 01/20/22 (30)  Please Advise

## 2022-04-07 ENCOUNTER — Other Ambulatory Visit: Payer: Self-pay

## 2022-04-07 ENCOUNTER — Encounter: Payer: Self-pay | Admitting: Family Medicine

## 2022-04-07 DIAGNOSIS — M8580 Other specified disorders of bone density and structure, unspecified site: Secondary | ICD-10-CM

## 2022-04-25 ENCOUNTER — Ambulatory Visit (INDEPENDENT_AMBULATORY_CARE_PROVIDER_SITE_OTHER): Payer: Medicare HMO | Admitting: Family Medicine

## 2022-04-25 ENCOUNTER — Encounter: Payer: Self-pay | Admitting: Family Medicine

## 2022-04-25 VITALS — BP 132/82 | HR 78 | Temp 98.8°F | Ht 65.5 in | Wt 157.0 lb

## 2022-04-25 DIAGNOSIS — K648 Other hemorrhoids: Secondary | ICD-10-CM

## 2022-04-25 DIAGNOSIS — G5621 Lesion of ulnar nerve, right upper limb: Secondary | ICD-10-CM

## 2022-04-25 MED ORDER — HYDROCORTISONE 2.5 % EX CREA: TOPICAL_CREAM | Freq: Two times a day (BID) | CUTANEOUS | 0 refills | Status: DC

## 2022-04-25 NOTE — Progress Notes (Signed)
Subjective:    Patient ID: Mary Blevins, female    DOB: 30-Jun-1945, 77 y.o.   MRN: [id number]  Patient presents today with a bump on her rectum for 3 or 4 days.  She states that it is painless.  It is not bleeding.  It does not hurt.  I performed an exam today with a chaperone present.  The patient has a prolapsed internal hemorrhoid.  It is roughly 2 cm in diameter.  It is nontender.  There is no thrombosis.  It is not bleeding. Past Medical History:  Diagnosis Date   Arthritis    COPD (chronic obstructive pulmonary disease)    Dyspnea    Elevated lipids    Generalized headaches    History of pneumonia    Hyperlipidemia    Hypertension    Hypothyroidism    MVP (mitral valve prolapse)    palpitations   Osteopenia    Seasonal allergies    Sinusitis    Tubular adenoma of colon    Past Surgical History:  Procedure Laterality Date   CATARACT EXTRACTION Left    COLONOSCOPY WITH PROPOFOL N/A 01/06/2016   Procedure: COLONOSCOPY WITH PROPOFOL;  Surgeon: Doran Stabler, MD;  Location: [facility];  Service: Gastroenterology;  Laterality: N/A;   DILATION AND CURETTAGE OF UTERUS     THYROIDECTOMY     TONSILLECTOMY     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   Procedure: RIGHT TOTAL HIP ARTHROPLASTY ANTERIOR APPROACH;  Surgeon: Mcarthur Rossetti, MD;  Location: [facility];  Service: Orthopedics;  Laterality: Right;  Needs RNFA   Current Outpatient Medications on File Prior to Visit  Medication Sig Dispense Refill   albuterol (PROVENTIL) (2.5 MG/3ML) 0.083% nebulizer solution INHALE 3 ML BY NEBULIZATION EVERY 6 HOURS AS NEEDED FOR WHEEZING OR SHORTNESS OF BREATH 300 mL 3   albuterol (VENTOLIN HFA) 108 (90 Base) MCG/ACT inhaler Inhale 2 puffs into the lungs every 6 (six) hours as needed for wheezing or shortness of breath. 8 g 6   budesonide-formoterol (SYMBICORT) 160-4.5 MCG/ACT inhaler Inhale 2 puffs into the lungs 2 (two) times daily. 6 g 0   DULoxetine (CYMBALTA) 60 MG  capsule      fluticasone (FLONASE) 50 MCG/ACT nasal spray SPRAY 2 SPRAYS INTO EACH NOSTRIL EVERY DAY 16 g 2   gabapentin (NEURONTIN) 300 MG capsule Take 1 capsule (300 mg total) by mouth daily. 30 capsule 3   guaiFENesin (MUCINEX) 600 MG 12 hr tablet Take 1 tablet (600 mg total) by mouth 2 (two) times daily. 14 tablet 0   hydrochlorothiazide (HYDRODIURIL) 25 MG tablet Take 1 tablet (25 mg total) by mouth daily. 90 tablet 3   HYDROcodone bit-homatropine (HYCODAN) 5-1.5 MG/5ML syrup Take 5 mLs by mouth every 8 (eight) hours as needed for cough. 120 mL 0   HYDROcodone-acetaminophen (NORCO) 7.5-325 MG tablet Take 1 tablet by mouth every 6 (six) hours as needed for moderate pain. 30 tablet 0   hydrOXYzine (ATARAX) 25 MG tablet TAKE 1 TABLET BY MOUTH 3 TIMES DAILY AS NEEDED FOR ITCHING. 30 tablet 6   levothyroxine (SYNTHROID) 50 MCG tablet TAKE 1 TABLET BY MOUTH EVERY DAY BEFORE BREAKFAST 90 tablet 3   montelukast (SINGULAIR) 10 MG tablet TAKE 1 TABLET BY MOUTH EVERYDAY AT BEDTIME 90 tablet 0   rosuvastatin (CRESTOR) 5 MG tablet TAKE 1 TABLET (5 MG TOTAL) BY MOUTH DAILY. 90 tablet 3   sodium chloride (OCEAN) 0.65 % SOLN nasal spray Place 1 spray into  both nostrils daily.     Tiotropium Bromide Monohydrate (SPIRIVA RESPIMAT) 2.5 MCG/ACT AERS Inhale 2 puffs into the lungs daily. 4 g 11   tiZANidine (ZANAFLEX) 4 MG tablet TAKE 1 TABLET BY MOUTH EVERY 6 HOURS AS NEEDED FOR MUSCLE SPASMS. 30 tablet 0   verapamil (CALAN-SR) 120 MG CR tablet TAKE 1 TABLET EVERY DAY 90 tablet 3   zolpidem (AMBIEN) 10 MG tablet Take 0.5 tablets (5 mg total) by mouth at bedtime as needed for sleep. 15 tablet 1   No current facility-administered medications on file prior to visit.   Allergies  Allergen Reactions   Oxycodone Other (See Comments)    Hallucinations    Meloxicam Other (See Comments)    BAD DREAMS, SAD THOUGHTS   Social History   Socioeconomic History   Marital status: Widowed    Spouse name: Not on file    Number of children: 0   Years of education: 35 B   Highest education level: Not on file  Occupational History    Employer: [employer]  Tobacco Use   Smoking status: Former    Types: Cigarettes    Quit date: 07/24/2005    Years since quitting: 16.7   Smokeless tobacco: Never  Vaping Use   Vaping Use: Never used  Substance and Sexual Activity   Alcohol use: Yes    Comment: rare   Drug use: No   Sexual activity: Not on file  Other Topics Concern   Not on file  Social History Narrative   Lives alone   Social Determinants of Health   Financial Resource Strain: Low Risk  (12/29/2021)   Overall Financial Resource Strain (CARDIA)    Difficulty of Paying Living Expenses: Not hard at all  Food Insecurity: No Food Insecurity (12/29/2021)   Hunger Vital Sign    Worried About Running Out of Food in the Last Year: Never true    [place] in the Last Year: Never true  Transportation Needs: No Transportation Needs (12/29/2021)   PRAPARE - Hydrologist (Medical): No    Lack of Transportation (Non-Medical): No  Physical Activity: Unknown (12/29/2021)   Exercise Vital Sign    Days of Exercise per Week: 0 days    Minutes of Exercise per Session: Not on file  Stress: No Stress Concern Present (12/29/2021)   [place]    Feeling of Stress : Not at all  Social Connections: Moderately Integrated (12/29/2021)   Social Connection and Isolation Panel [NHANES]    Frequency of Communication with Friends and Family: More than three times a week    Frequency of Social Gatherings with Friends and Family: Three times a week    Attends Religious Services: More than 4 times per year    Active Member of Clubs or Organizations: Yes    Attends Archivist Meetings: More than 4 times per year    Marital Status: Widowed  Intimate Partner Violence: Not At Risk (12/29/2021)   Humiliation,  Afraid, Rape, and Kick questionnaire    Fear of Current or Ex-Partner: No    Emotionally Abused: No    Physically Abused: No    Sexually Abused: No     Review of Systems  All other systems reviewed and are negative.      Objective:   Physical Exam Vitals reviewed. Exam conducted with a chaperone present.  Constitutional:      General: She  is not in acute distress.    Appearance: She is well-developed and normal weight. She is not ill-appearing.  Cardiovascular:     Rate and Rhythm: Normal rate and regular rhythm.     Pulses: Normal pulses.     Heart sounds: Normal heart sounds. No murmur heard.    No friction rub. No gallop.  Pulmonary:     Effort: Pulmonary effort is normal. No tachypnea, accessory muscle usage, prolonged expiration or respiratory distress.     Breath sounds: Normal breath sounds and air entry. No stridor, decreased air movement or transmitted upper airway sounds. No decreased breath sounds, wheezing, rhonchi or rales.  Genitourinary:    Rectum: Internal hemorrhoid present.    Musculoskeletal:     Right lower leg: No edema.     Left lower leg: No edema.  Neurological:     Mental Status: She is alert.           Assessment & Plan:   Ulnar neuropathy of right upper extremity - Plan: Nerve conduction test  Prolapsed internal hemorrhoids At the patient's last office visit I ordered nerve conduction studies to evaluate for right-sided ulnar neuropathy.  She states that she never heard from them.  I replaced the orders today and asked her to call me if she has not heard in 1 week.  She has a prolapsed, painless internal hemorrhoid.  I recommended using 2.5% hydrocortisone cream 2-3 times daily to reduce swelling and inflammation.  Also recommended using stool softener to avoid constipation.

## 2022-04-30 ENCOUNTER — Other Ambulatory Visit: Payer: Self-pay | Admitting: Family Medicine

## 2022-05-02 ENCOUNTER — Telehealth: Payer: Self-pay | Admitting: Pulmonary Disease

## 2022-05-02 ENCOUNTER — Other Ambulatory Visit: Payer: Self-pay | Admitting: Family Medicine

## 2022-05-02 ENCOUNTER — Telehealth: Payer: Self-pay

## 2022-05-02 MED ORDER — SPIRIVA RESPIMAT 2.5 MCG/ACT IN AERS: 2.0000 | INHALATION_SPRAY | Freq: Every day | RESPIRATORY_TRACT | 0 refills | Status: DC

## 2022-05-02 MED ORDER — HYDROCODONE-ACETAMINOPHEN 7.5-325 MG PO TABS: 1.0000 | ORAL_TABLET | Freq: Four times a day (QID) | ORAL | 0 refills | Status: DC | PRN

## 2022-05-02 NOTE — Telephone Encounter (Signed)
Pt called in to request a refill of this med HYDROcodone-acetaminophen (NORCO) 7.5-325 MG tablet [[id number]].  LOV: 04/25/22  PHARMACY: [id number] [place], [place] - [address] AT [facility] [address],  [place] [address] Phone: [phone]  Fax: [phone] DEA #: [id number]    CB#: [phone]

## 2022-05-02 NOTE — Telephone Encounter (Signed)
Pt called stating that she is running low on her Spiriva inhaler and states that she is needing a sample to hold her over until she gets her check.  Routing to [place] office to check to see if there are any samples of Spiriva 2.5 for pt.   Pt can be reached at [phone].

## 2022-05-02 NOTE — Telephone Encounter (Signed)
Spoke with the pt 2 samples of spiriva 2.5 at front desk for pick up at [place] office Nothing further needed

## 2022-05-05 DIAGNOSIS — H40023 Open angle with borderline findings, high risk, bilateral: Secondary | ICD-10-CM | POA: Diagnosis not present

## 2022-05-18 ENCOUNTER — Telehealth: Payer: Self-pay

## 2022-05-18 NOTE — Telephone Encounter (Signed)
Pt called in to ask if pcp would send in a med for arthritis. Please advise.  Cb#: [phone]

## 2022-05-19 ENCOUNTER — Other Ambulatory Visit: Payer: Self-pay | Admitting: Family Medicine

## 2022-05-19 MED ORDER — DICLOFENAC SODIUM 75 MG PO TBEC: 75.0000 mg | DELAYED_RELEASE_TABLET | Freq: Two times a day (BID) | ORAL | 1 refills | Status: DC

## 2022-05-23 IMAGING — US US EXTREM LOW VENOUS
1 series · 13 of 24 positions shown · non-contrast
Comparison: None.

CLINICAL DATA: 76-year-old female with a history possible DVT



[Series 1: us venous img lower bilat (dvt) · portal-venous · 13 of 72 slices shown]
[im 1/72]
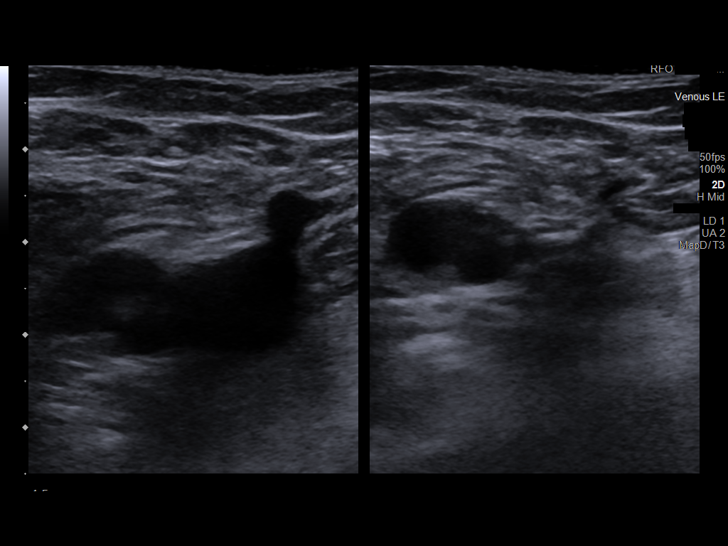
[im 7/72]
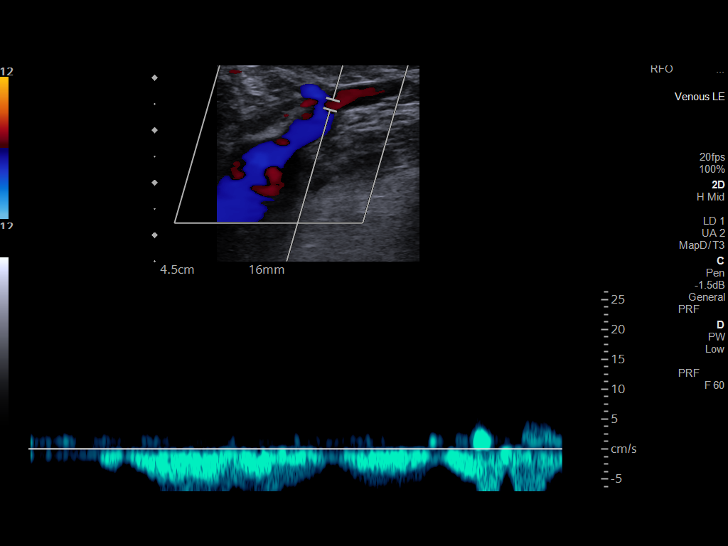
[im 13/72]
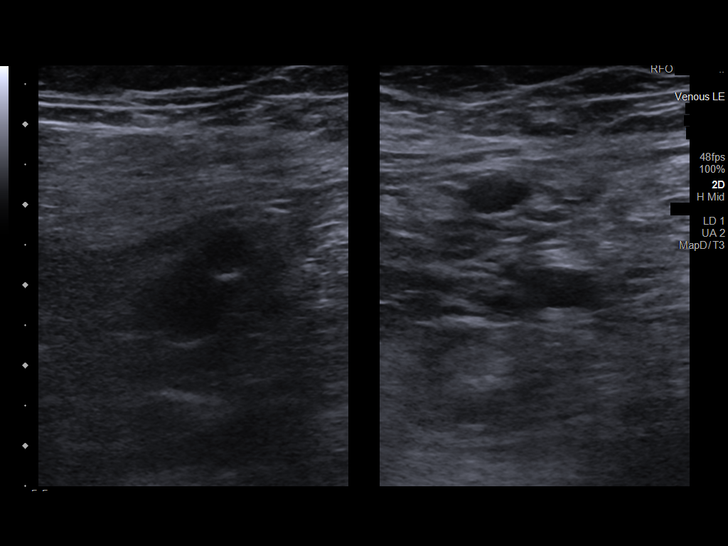
[im 19/72]
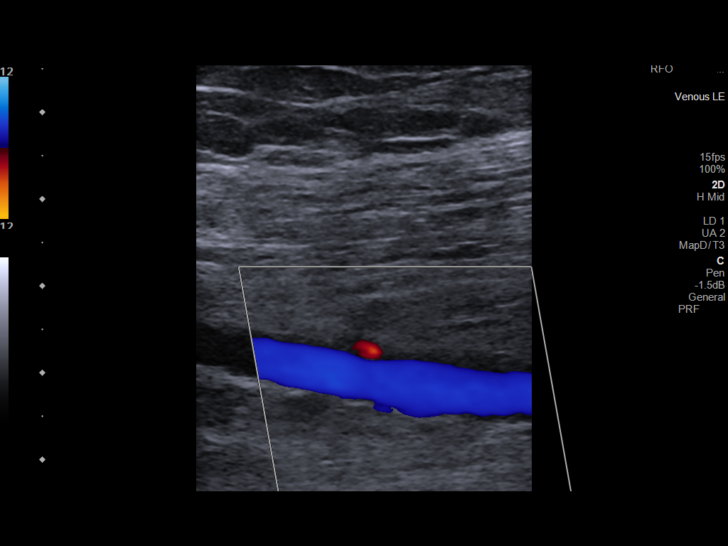
[im 25/72]
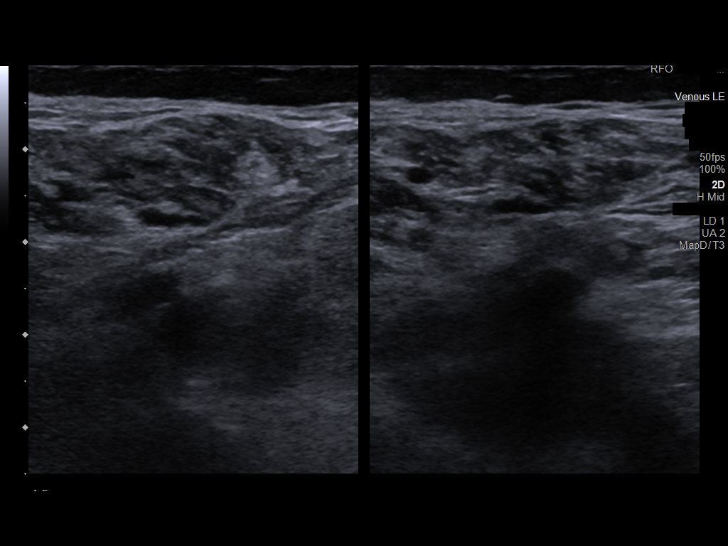
[im 31/72]
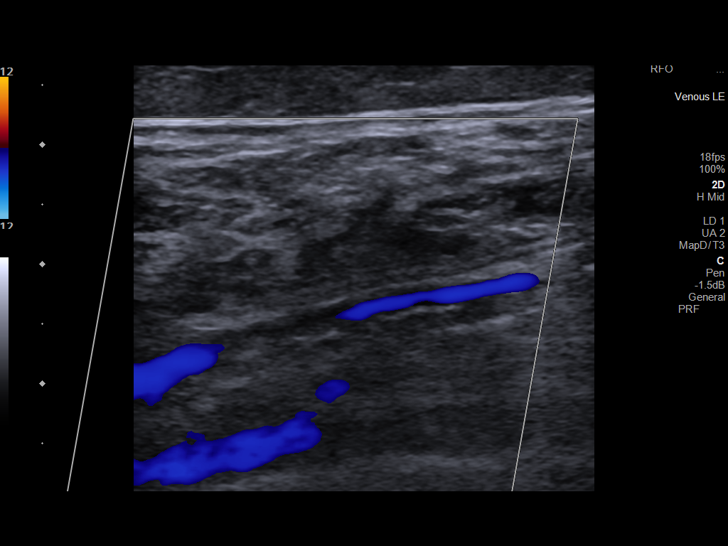
[im 38/72]
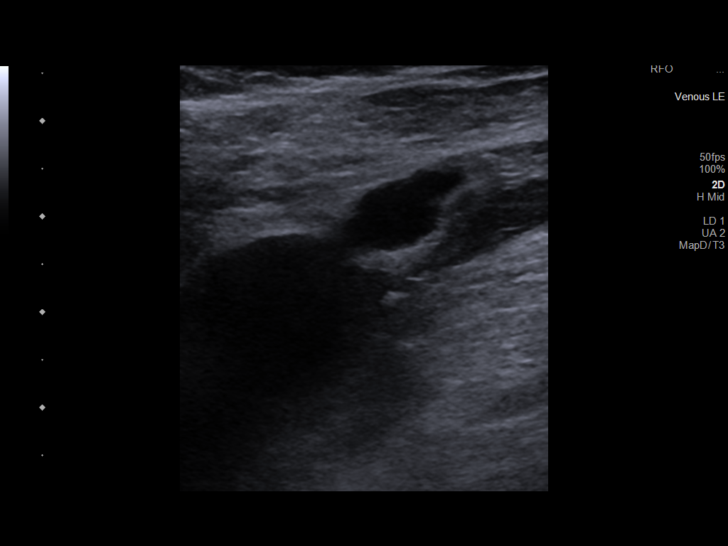
[im 41/72]
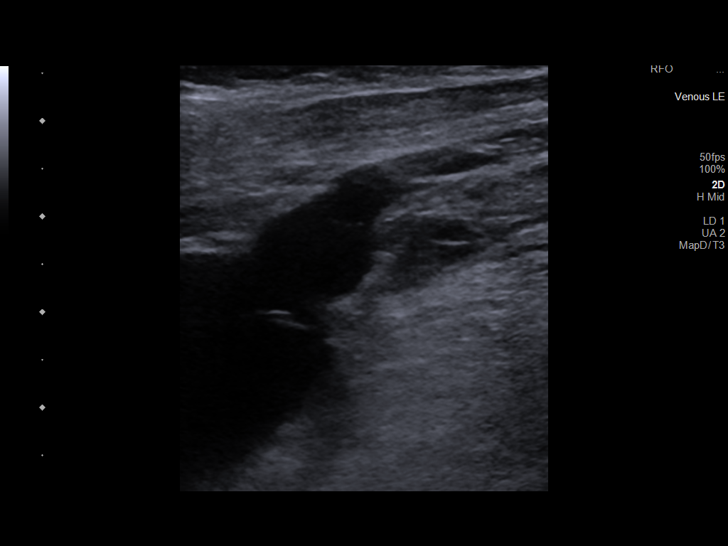
[im 47/72]
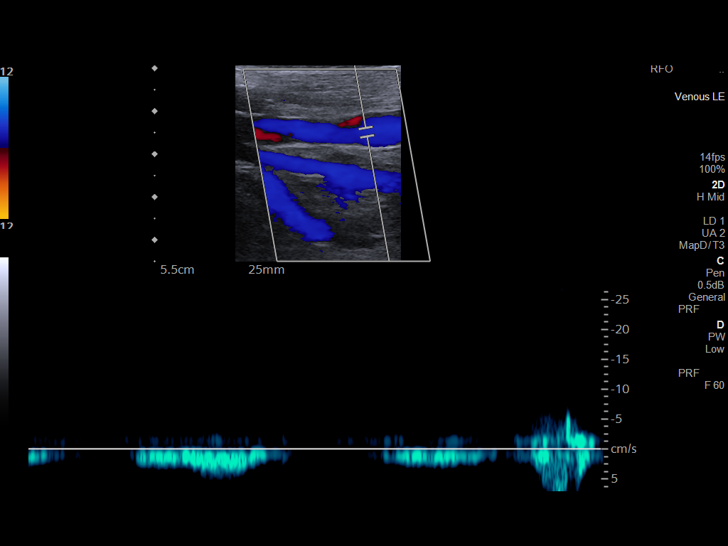
[im 53/72]
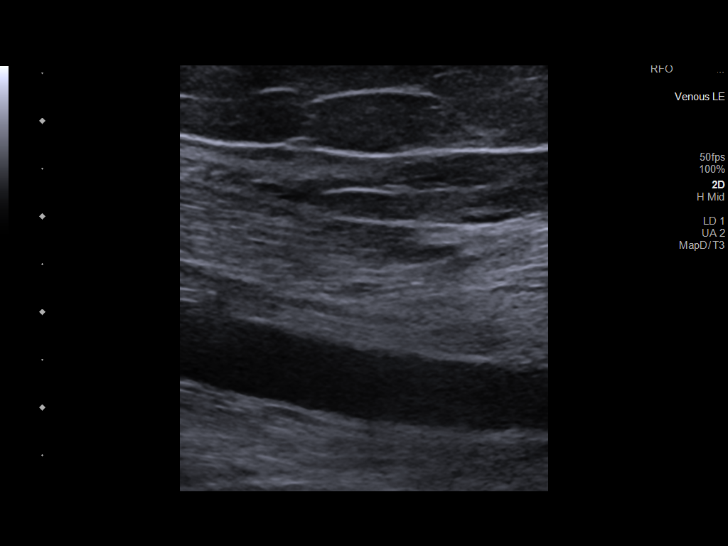
[im 59/72]
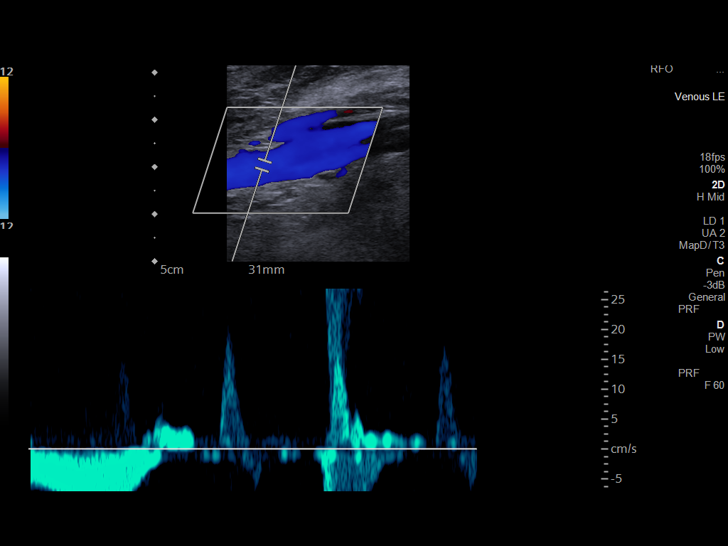
[im 65/72]
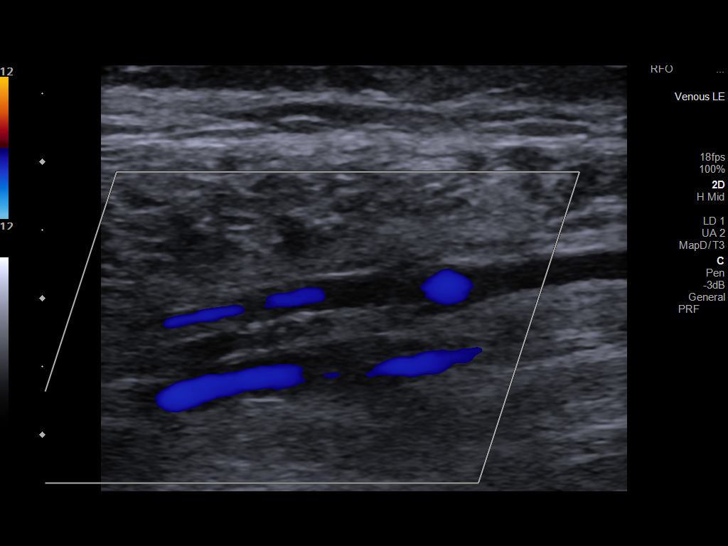
[im 72/72]
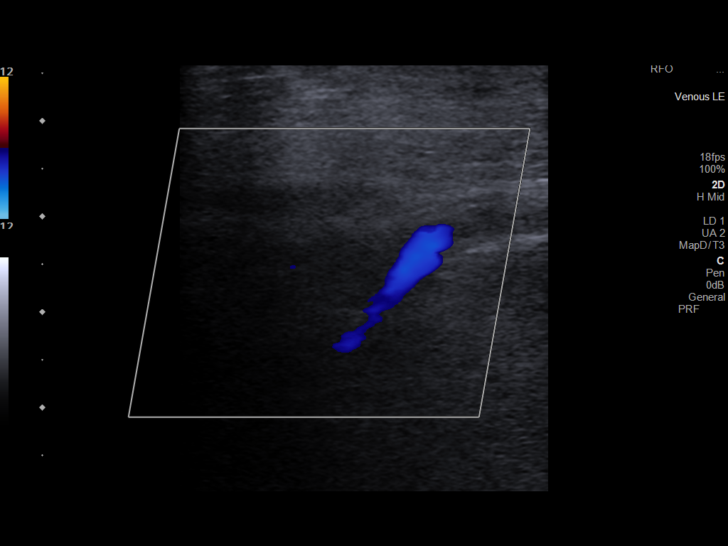

[13 of 24 positions shown; findings below may reference images not displayed]

FINDINGS: RIGHT LOWER EXTREMITY

Common Femoral Vein: No evidence of thrombus. Normal
compressibility, respiratory phasicity and response to augmentation.

Saphenofemoral Junction: No evidence of thrombus. Normal
compressibility and flow on color Doppler imaging.

Profunda Femoral Vein: No evidence of thrombus. Normal
compressibility and flow on color Doppler imaging.

Femoral Vein: No evidence of thrombus. Normal compressibility,
respiratory phasicity and response to augmentation.

Popliteal Vein: No evidence of thrombus. Normal compressibility,
respiratory phasicity and response to augmentation.

Calf Veins: No evidence of thrombus. Normal compressibility and flow
on color Doppler imaging.

Superficial Great Saphenous Vein: No evidence of thrombus. Normal
compressibility and flow on color Doppler imaging.

Other Findings:  None.

LEFT LOWER EXTREMITY

Common Femoral Vein: No evidence of thrombus. Normal
compressibility, respiratory phasicity and response to augmentation.

Saphenofemoral Junction: No evidence of thrombus. Normal
compressibility and flow on color Doppler imaging.

Profunda Femoral Vein: No evidence of thrombus. Normal
compressibility and flow on color Doppler imaging.

Femoral Vein: No evidence of thrombus. Normal compressibility,
respiratory phasicity and response to augmentation.

Popliteal Vein: No evidence of thrombus. Normal compressibility,
respiratory phasicity and response to augmentation.

Calf Veins: No evidence of thrombus. Normal compressibility and flow
on color Doppler imaging.

Superficial Great Saphenous Vein: No evidence of thrombus. Normal
compressibility and flow on color Doppler imaging.

Other Findings:  None.
IMPRESSION: Directed duplex of the bilateral lower extremities negative for DVT

## 2022-05-27 ENCOUNTER — Other Ambulatory Visit: Payer: Self-pay | Admitting: Family Medicine

## 2022-06-01 ENCOUNTER — Other Ambulatory Visit: Payer: Self-pay | Admitting: Family Medicine

## 2022-06-08 ENCOUNTER — Telehealth: Payer: Self-pay

## 2022-06-08 NOTE — Telephone Encounter (Signed)
Prescription Request  06/08/2022  LOV: 02/27/22  What is the name of the medication or equipment? HYDROcodone-acetaminophen (NORCO) 7.5-325 MG tablet [[id number]  Have you contacted your pharmacy to request a refill? No   Which pharmacy would you like this sent to?  [id number] [place], [place] - [address] AT [facility] [address] [place] [address] Phone: [phone] Fax: [phone]    Patient notified that their request is being sent to the clinical staff for review and that they should receive a response within 2 business days.   Please advise at [facility] [phone]

## 2022-06-09 ENCOUNTER — Other Ambulatory Visit: Payer: Self-pay | Admitting: Family Medicine

## 2022-06-09 MED ORDER — HYDROCODONE-ACETAMINOPHEN 7.5-325 MG PO TABS: 1.0000 | ORAL_TABLET | Freq: Four times a day (QID) | ORAL | 0 refills | Status: DC | PRN

## 2022-06-10 ENCOUNTER — Other Ambulatory Visit: Payer: Self-pay | Admitting: Family Medicine

## 2022-06-12 ENCOUNTER — Other Ambulatory Visit: Payer: Self-pay | Admitting: Family Medicine

## 2022-06-12 NOTE — Telephone Encounter (Signed)
OV 04/25/22 Requested Prescriptions  Pending Prescriptions Disp Refills   montelukast (SINGULAIR) 10 MG tablet [Pharmacy Med Name: MONTELUKAST SOD 10 MG TABLET] 90 tablet 0    Sig: TAKE 1 TABLET BY MOUTH EVERYDAY AT BEDTIME     Pulmonology:  Leukotriene Inhibitors Failed - 06/10/2022  8:51 AM      Failed - Valid encounter within last 12 months    Recent Outpatient Visits           1 year ago COPD with acute exacerbation (HCC)   [facility] Pickard, Priscille Heidelberg, MD   1 year ago COPD with acute exacerbation (HCC)   [facility] Donita Brooks, MD   1 year ago COPD with acute exacerbation [facility])   [facility] Donita Brooks, MD   1 year ago Acute pain of left knee   [facility] Donita Brooks, MD   1 year ago Mixed simple and mucopurulent chronic bronchitis (HCC)   [facility] Valentino Nose, NP       Future Appointments             In 1 month Coralyn Helling, MD [facility] Pulmonary Care

## 2022-06-13 NOTE — Telephone Encounter (Signed)
Requested Prescriptions  Pending Prescriptions Disp Refills   hydrOXYzine (ATARAX) 25 MG tablet [Pharmacy Med Name: HYDROXYZINE HCL 25 MG TABLET] 90 tablet 2    Sig: TAKE 1 TABLET BY MOUTH 3 TIMES DAILY AS NEEDED FOR ITCHING.     Ear, Nose, and Throat:  Antihistamines 2 Failed - 06/12/2022  5:48 AM      Failed - Valid encounter within last 12 months    Recent Outpatient Visits           1 year ago COPD with acute exacerbation (HCC)   [facility] Pickard, Priscille Heidelberg, MD   1 year ago COPD with acute exacerbation (HCC)   [facility] Pickard, Priscille Heidelberg, MD   1 year ago COPD with acute exacerbation [facility])   [facility] Donita Brooks, MD   1 year ago Acute pain of left knee   [facility] Donita Brooks, MD   1 year ago Mixed simple and mucopurulent chronic bronchitis (HCC)   [facility] Valentino Nose, NP       Future Appointments             In 1 month Coralyn Helling, MD  Pulmonary Care            Passed - Cr in normal range and within 360 days    Creat  Date Value Ref Range Status  01/26/2022 0.83 0.60 - 1.00 mg/dL Final

## 2022-06-17 IMAGING — DX DG CHEST 2V
2 series · 2 of 2 positions shown · non-contrast
Comparison: Chest radiograph dated January 18, 2021

CLINICAL DATA: Cough

EXAM:
CHEST - 2 VIEW

[chest pa]
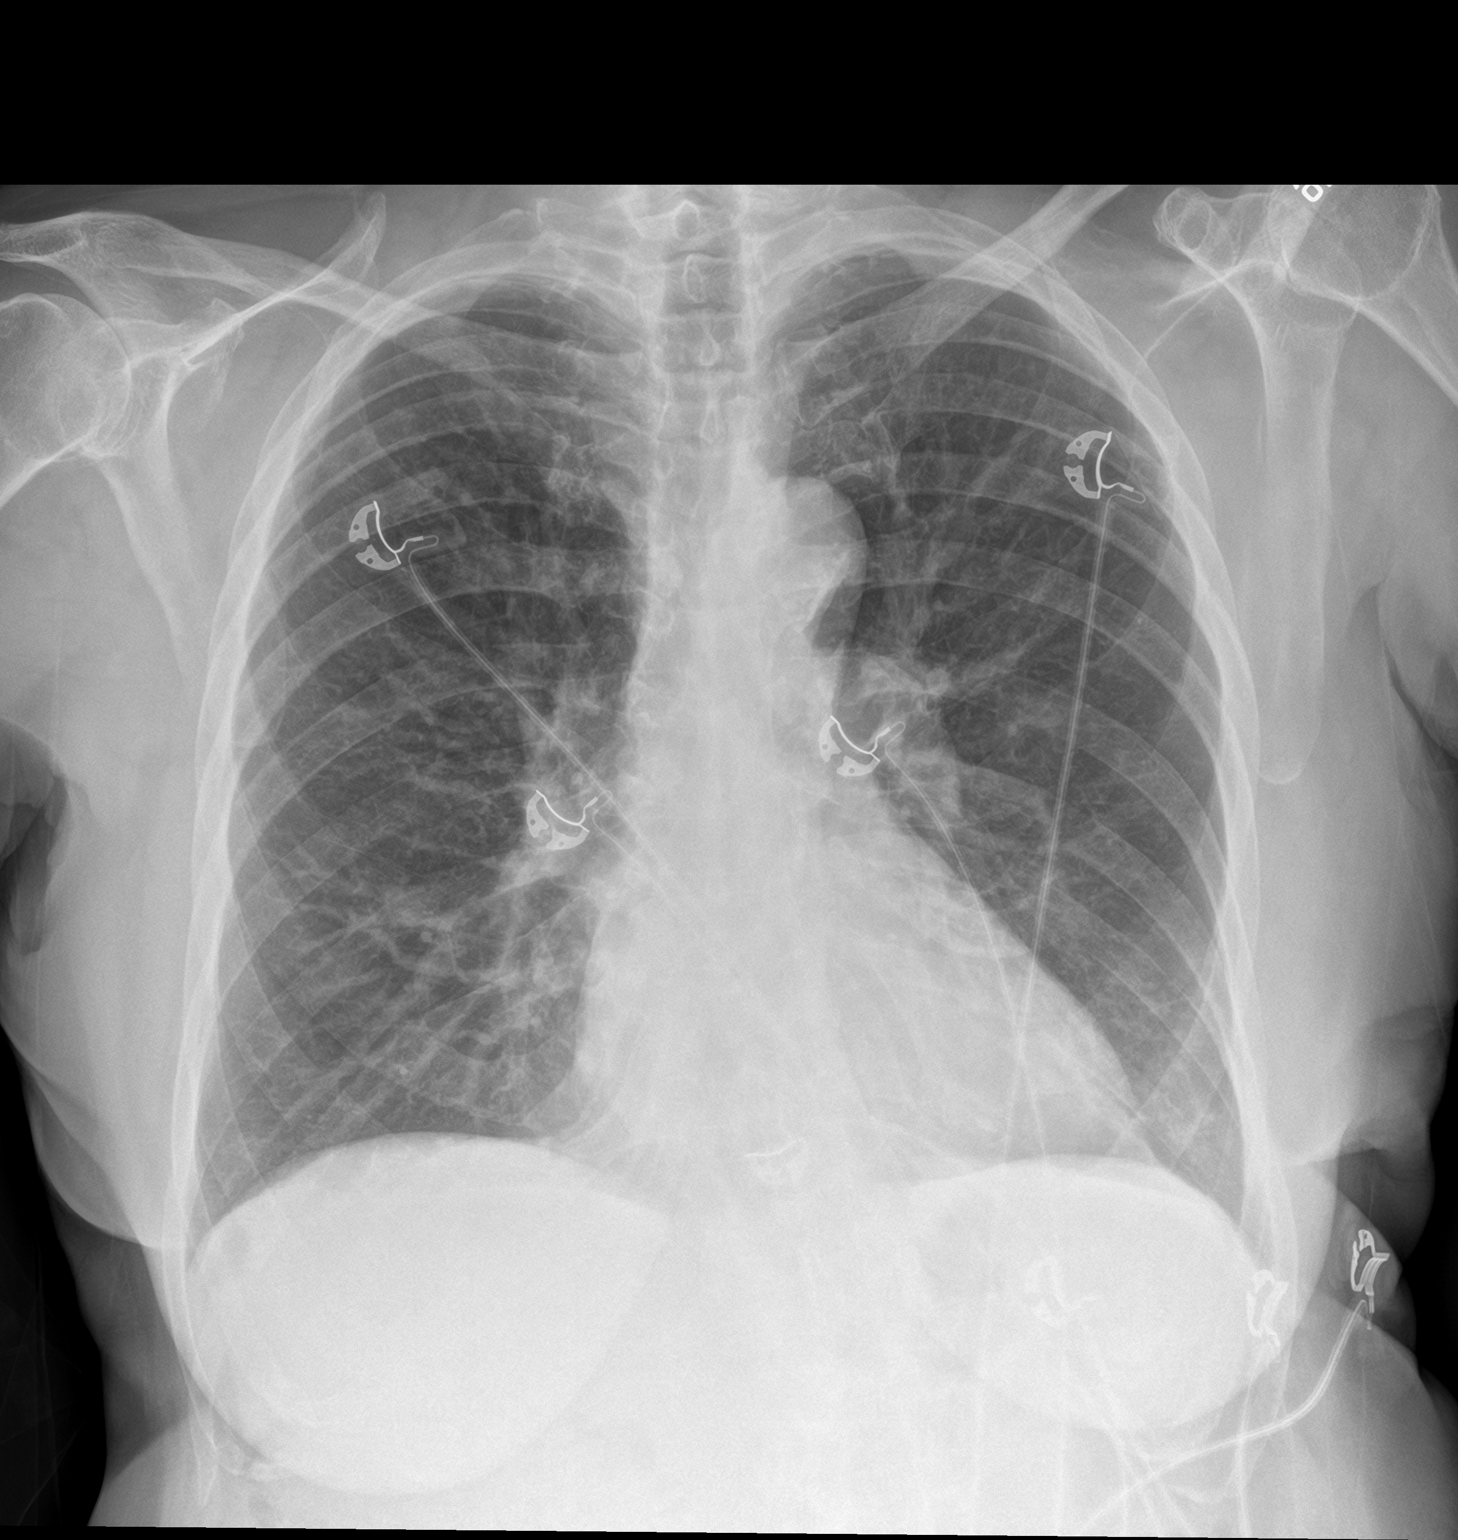

[chest lat]
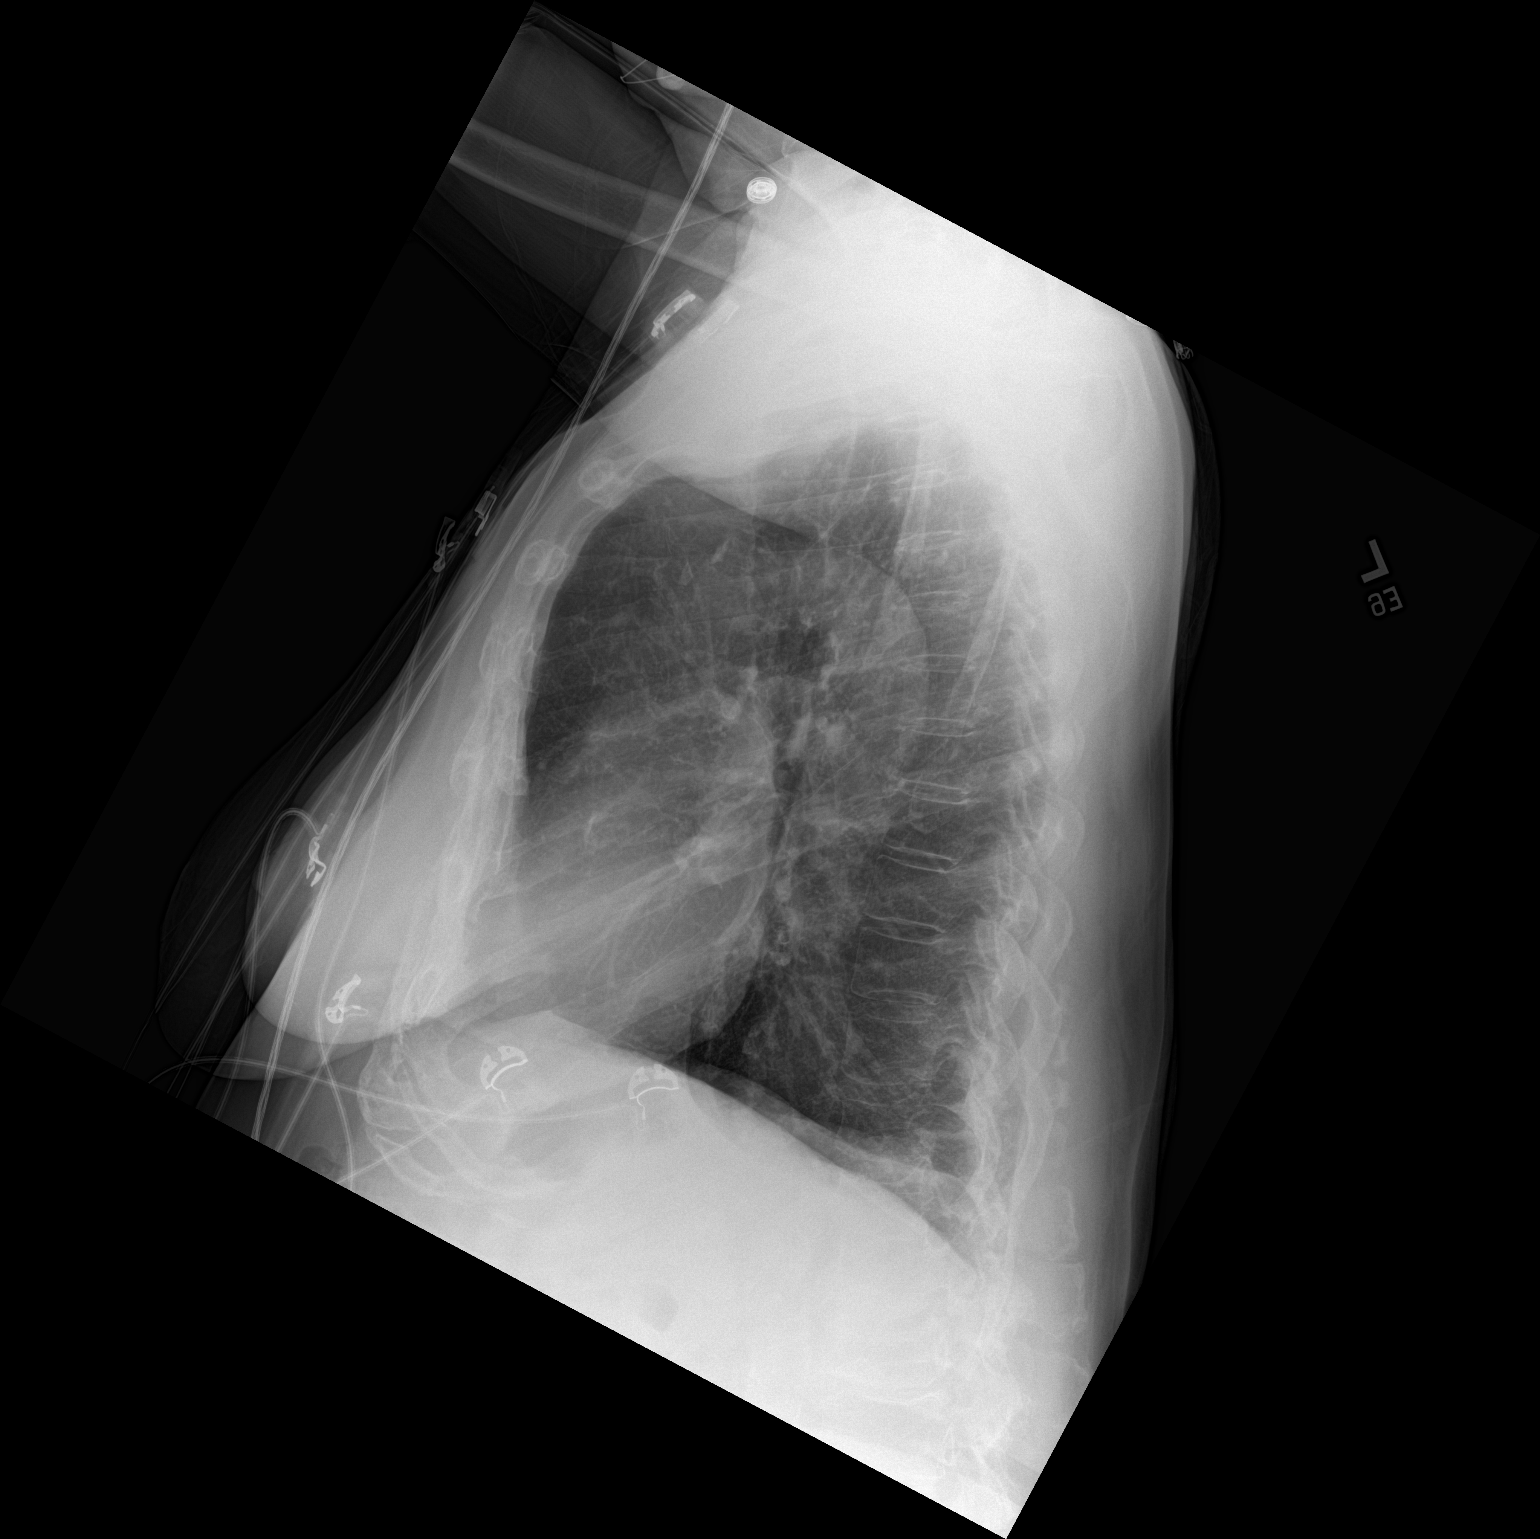

[2 of 2 positions shown; findings below may reference images not displayed]

FINDINGS: The heart size and mediastinal contours are within normal limits.
Hyperinflated lungs without focal consolidation or pleural effusion.
The visualized skeletal structures are unremarkable.
IMPRESSION: No active cardiopulmonary disease.

## 2022-06-20 ENCOUNTER — Telehealth: Payer: Self-pay | Admitting: Pulmonary Disease

## 2022-06-20 MED ORDER — SPIRIVA RESPIMAT 2.5 MCG/ACT IN AERS: 2.0000 | INHALATION_SPRAY | Freq: Every day | RESPIRATORY_TRACT | 0 refills | Status: DC

## 2022-06-20 NOTE — Telephone Encounter (Signed)
Called and spoke w/ pt letting her know that I have placed 2 samples up at the desk for her to p/u at her earliest convenience. NFN at time of call.   Samples have been documented in pt chart & log.

## 2022-06-20 NOTE — Telephone Encounter (Signed)
Patient would like to know if we have any samples of Spiriva. Please call back at [phone]

## 2022-06-23 ENCOUNTER — Other Ambulatory Visit: Payer: Self-pay | Admitting: Family Medicine

## 2022-06-23 DIAGNOSIS — R200 Anesthesia of skin: Secondary | ICD-10-CM

## 2022-06-23 DIAGNOSIS — M25552 Pain in left hip: Secondary | ICD-10-CM

## 2022-06-23 NOTE — Telephone Encounter (Signed)
Requested Prescriptions  Pending Prescriptions Disp Refills   gabapentin (NEURONTIN) 300 MG capsule [Pharmacy Med Name: GABAPENTIN 300 MG CAPSULE] 90 capsule 3    Sig: TAKE 1 CAPSULE BY MOUTH EVERY DAY     Neurology: Anticonvulsants - gabapentin Failed - 06/23/2022  9:02 AM      Failed - Valid encounter within last 12 months    Recent Outpatient Visits           1 year ago COPD with acute exacerbation (HCC)   [facility] Pickard, Priscille Heidelberg, MD   1 year ago COPD with acute exacerbation (HCC)   [facility] Pickard, Priscille Heidelberg, MD   1 year ago COPD with acute exacerbation (HCC)   [facility] Pickard, Priscille Heidelberg, MD   1 year ago Acute pain of left knee   [facility] Donita Brooks, MD   1 year ago Mixed simple and mucopurulent chronic bronchitis (HCC)   [facility] Valentino Nose, NP       Future Appointments             In 1 month Coralyn Helling, MD [place] [facility] Care            Passed - Cr in normal range and within 360 days    Creat  Date Value Ref Range Status  01/26/2022 0.83 0.60 - 1.00 mg/dL Final         Passed - Completed PHQ-2 or PHQ-9 in the last 360 days

## 2022-06-23 NOTE — Telephone Encounter (Signed)
Prescription Request  06/23/2022  LOV: 04/25/2022  What is the name of the medication or equipment? HYDROcodone-acetaminophen (NORCO) 7.5-325 MG tablet [[id number]]  Have you contacted your pharmacy to request a refill? No   Which pharmacy would you like this sent to?  [id number] [place], [place] - [address] AT [facility] [address] [place] [address] Phone: [phone] Fax: [phone]    Patient notified that their request is being sent to the clinical staff for review and that they should receive a response within 2 business days.   Please advise at [facility] [phone]

## 2022-06-27 ENCOUNTER — Other Ambulatory Visit: Payer: Self-pay | Admitting: Family Medicine

## 2022-06-27 ENCOUNTER — Telehealth: Payer: Self-pay

## 2022-06-27 NOTE — Telephone Encounter (Signed)
Requested medications are due for refill today.  yes  Requested medications are on the active medications list.  yes  Last refill. 06/01/2022 #30 0 rf  Future visit scheduled.   no  Notes to clinic.  Refill not delegated.    Requested Prescriptions  Pending Prescriptions Disp Refills   tiZANidine (ZANAFLEX) 4 MG tablet [Pharmacy Med Name: TIZANIDINE HCL 4 MG TABLET] 30 tablet 0    Sig: TAKE 1 TABLET BY MOUTH EVERY 6 HOURS AS NEEDED FOR MUSCLE SPASMS.     Not Delegated - Cardiovascular:  Alpha-2 Agonists - tizanidine Failed - 06/27/2022  9:17 AM      Failed - This refill cannot be delegated      Failed - Valid encounter within last 6 months    Recent Outpatient Visits           1 year ago COPD with acute exacerbation (HCC)   [facility] Pickard, Priscille Heidelberg, MD   1 year ago COPD with acute exacerbation (HCC)   [facility] Donita Brooks, MD   1 year ago COPD with acute exacerbation [facility])   [facility] Donita Brooks, MD   1 year ago Acute pain of left knee   [facility] Donita Brooks, MD   1 year ago Mixed simple and mucopurulent chronic bronchitis (HCC)   [facility] Valentino Nose, NP       Future Appointments             In 1 month Coralyn Helling, MD [facility] Pulmonary Care

## 2022-06-27 NOTE — Telephone Encounter (Signed)
Prescription Request  06/27/2022  LOV: 04/25/22  What is the name of the medication or equipment? HYDROcodone bit-homatropine (HYCODAN) 5-1.5 MG/5ML syrup [[id number]]  Have you contacted your pharmacy to request a refill? No   Which pharmacy would you like this sent to?  [id number] [place], [place] - [address] AT [facility] [address] [place] [address] Phone: [phone] Fax: [phone]    Patient notified that their request is being sent to the clinical staff for review and that they should receive a response within 2 business days.   Please advise at [facility] [phone]

## 2022-07-07 ENCOUNTER — Telehealth: Payer: Self-pay

## 2022-07-07 ENCOUNTER — Other Ambulatory Visit: Payer: Self-pay | Admitting: Family Medicine

## 2022-07-07 DIAGNOSIS — R0602 Shortness of breath: Secondary | ICD-10-CM

## 2022-07-07 DIAGNOSIS — J441 Chronic obstructive pulmonary disease with (acute) exacerbation: Secondary | ICD-10-CM

## 2022-07-07 MED ORDER — HYDROCODONE-ACETAMINOPHEN 7.5-325 MG PO TABS: 1.0000 | ORAL_TABLET | Freq: Four times a day (QID) | ORAL | 0 refills | Status: DC | PRN

## 2022-07-07 NOTE — Telephone Encounter (Signed)
Prescription Request  07/07/2022  LOV: 04/25/2022  What is the name of the medication or equipment? HYDROcodone bit-homatropine (HYCODAN) 5-1.5 MG/5ML syrup  Have you contacted your pharmacy to request a refill? Yes   Which pharmacy would you like this sent to?  [id number] [place], [place] - [address] AT [facility] [address] [place] [address] Phone: [phone] Fax: [phone]    Patient notified that their request is being sent to the clinical staff for review and that they should receive a response within 2 business days.   Please advise at [facility] [phone]

## 2022-07-07 NOTE — Telephone Encounter (Signed)
Requested medication (s) are due for refill today - no  Requested medication (s) are on the active medication list -yes  Future visit scheduled -no  Last refill: 05/29/22 28g 1RF  Notes to clinic: off protocol- provider review   Requested Prescriptions  Pending Prescriptions Disp Refills   hydrocortisone 2.5 % cream [Pharmacy Med Name: HYDROCORTISONE 2.5% CREAM] 28 g 1    Sig: APPLY TO AFFECTED AREA TWICE A DAY     Off-Protocol Failed - 07/07/2022  8:36 AM      Failed - Medication not assigned to a protocol, review manually.      Failed - Valid encounter within last 12 months    Recent Outpatient Visits           1 year ago COPD with acute exacerbation (HCC)   [facility] Pickard, Priscille Heidelberg, MD   1 year ago COPD with acute exacerbation (HCC)   [facility] Pickard, Priscille Heidelberg, MD   1 year ago COPD with acute exacerbation (HCC)   [facility] Pickard, Priscille Heidelberg, MD   1 year ago Acute pain of left knee   [facility] Donita Brooks, MD   1 year ago Mixed simple and mucopurulent chronic bronchitis (HCC)   [facility] Valentino Nose, NP       Future Appointments             In 4 weeks Coralyn Helling, MD [facility] Pulmonary Care               Requested Prescriptions  Pending Prescriptions Disp Refills   hydrocortisone 2.5 % cream [Pharmacy Med Name: HYDROCORTISONE 2.5% CREAM] 28 g 1    Sig: APPLY TO AFFECTED AREA TWICE A DAY     Off-Protocol Failed - 07/07/2022  8:36 AM      Failed - Medication not assigned to a protocol, review manually.      Failed - Valid encounter within last 12 months    Recent Outpatient Visits           1 year ago COPD with acute exacerbation (HCC)   [facility] Pickard, Priscille Heidelberg, MD   1 year ago COPD with acute exacerbation (HCC)   [facility] Pickard, Priscille Heidelberg, MD   1 year ago COPD with acute exacerbation [facility])   [facility] Donita Brooks, MD   1 year ago Acute pain of left knee   [facility] Donita Brooks, MD   1 year ago Mixed simple and mucopurulent chronic bronchitis (HCC)   [facility] Valentino Nose, NP       Future Appointments             In 4 weeks Coralyn Helling, MD [facility] Pulmonary Care

## 2022-07-07 NOTE — Telephone Encounter (Signed)
Requested medication (s) are due for refill today:yes  Requested medication (s) are on the active medication list: yes  Last refill:  06/07/21 300 ml 3 RF  Future visit scheduled: no  Notes to clinic:  last ordered 06/07/21- needs to be reordered due to over 77 year old order   Requested Prescriptions  Pending Prescriptions Disp Refills   albuterol (PROVENTIL) (2.5 MG/3ML) 0.083% nebulizer solution [Pharmacy Med Name: ALBUTEROL SUL 2.5 MG/3 ML SOLN] 300 mL 3    Sig: INHALE 3 ML BY NEBULIZATION EVERY 6 HOURS AS NEEDED FOR WHEEZING OR SHORTNESS OF BREATH     Pulmonology:  Beta Agonists 2 Failed - 07/07/2022 11:18 AM      Failed - Valid encounter within last 12 months    Recent Outpatient Visits           1 year ago COPD with acute exacerbation (HCC)   [facility] Pickard, Priscille Heidelberg, MD   1 year ago COPD with acute exacerbation (HCC)   [facility] Pickard, Priscille Heidelberg, MD   1 year ago COPD with acute exacerbation (HCC)   [facility] Pickard, Priscille Heidelberg, MD   1 year ago Acute pain of left knee   [facility] Donita Brooks, MD   1 year ago Mixed simple and mucopurulent chronic bronchitis (HCC)   [facility] Valentino Nose, NP       Future Appointments             In 4 weeks Coralyn Helling, MD [facility] Pulmonary Care            Passed - Last BP in normal range    BP Readings from Last 1 Encounters:  04/25/22 132/82         Passed - Last Heart Rate in normal range    Pulse Readings from Last 1 Encounters:  04/25/22 78

## 2022-07-07 NOTE — Telephone Encounter (Signed)
Pt called and states she is in need of a refill on her Hydrocodone.  Last RF 06/08/2022. Last OV 04/25/22. Pt states the pharmacy is telling her that she can't pick it up until 7/1, which is unusual. Pt states she will pay out of pocket. Thank you.

## 2022-07-12 ENCOUNTER — Other Ambulatory Visit: Payer: Self-pay | Admitting: Family Medicine

## 2022-07-16 ENCOUNTER — Other Ambulatory Visit: Payer: Self-pay | Admitting: Family Medicine

## 2022-07-28 ENCOUNTER — Telehealth: Payer: Self-pay

## 2022-07-28 NOTE — Telephone Encounter (Signed)
Pt called in to make pcp aware that she had a fall at home. Pt states that she is not hurt but her right knee just went out and she fell. Please advise.  Cb#: [phone]

## 2022-07-31 ENCOUNTER — Emergency Department (HOSPITAL_COMMUNITY): Payer: Medicare HMO

## 2022-07-31 ENCOUNTER — Ambulatory Visit (INDEPENDENT_AMBULATORY_CARE_PROVIDER_SITE_OTHER): Payer: Medicare HMO | Admitting: Family Medicine

## 2022-07-31 ENCOUNTER — Emergency Department (HOSPITAL_COMMUNITY)
Admission: EM | Admit: 2022-07-31 | Discharge: 2022-07-31 | Disposition: A | Discharge status: Home / Self Care | Payer: Medicare HMO | Attending: Emergency Medicine | Admitting: Emergency Medicine

## 2022-07-31 ENCOUNTER — Encounter: Payer: Self-pay | Admitting: Family Medicine

## 2022-07-31 ENCOUNTER — Encounter (HOSPITAL_COMMUNITY): Payer: Self-pay

## 2022-07-31 VITALS — BP 120/70 | HR 73 | Temp 98.7°F | Ht 65.0 in | Wt 151.6 lb

## 2022-07-31 DIAGNOSIS — R202 Paresthesia of skin: Secondary | ICD-10-CM | POA: Diagnosis not present

## 2022-07-31 DIAGNOSIS — J441 Chronic obstructive pulmonary disease with (acute) exacerbation: Secondary | ICD-10-CM | POA: Diagnosis not present

## 2022-07-31 DIAGNOSIS — R0602 Shortness of breath: Secondary | ICD-10-CM | POA: Insufficient documentation

## 2022-07-31 DIAGNOSIS — M25561 Pain in right knee: Secondary | ICD-10-CM | POA: Diagnosis not present

## 2022-07-31 DIAGNOSIS — R457 State of emotional shock and stress, unspecified: Secondary | ICD-10-CM | POA: Diagnosis not present

## 2022-07-31 DIAGNOSIS — M1711 Unilateral primary osteoarthritis, right knee: Secondary | ICD-10-CM | POA: Diagnosis not present

## 2022-07-31 LAB — BASIC METABOLIC PANEL
Anion gap: 8 (ref 5–15)
BUN: 14 mg/dL (ref 8–23)
CO2: 26 mmol/L (ref 22–32)
Calcium: 8.7 mg/dL — ABNORMAL LOW (ref 8.9–10.3)
Chloride: 98 mmol/L (ref 98–111)
Creatinine, Ser: 0.86 mg/dL (ref 0.44–1.00)
GFR, Estimated: >60 mL/min (ref >60)
Glucose, Bld: 109 mg/dL — ABNORMAL HIGH (ref 70–99)
Potassium: 3.3 mmol/L — ABNORMAL LOW (ref 3.5–5.1)
Sodium: 132 mmol/L — ABNORMAL LOW (ref 135–145)

## 2022-07-31 LAB — CBC WITH DIFFERENTIAL/PLATELET
Abs Immature Granulocytes: 0.05 10*3/uL (ref 0.00–0.07)
Basophils Absolute: 0 10*3/uL (ref 0.0–0.1)
Basophils Relative: 0 %
Eosinophils Absolute: 0.1 10*3/uL (ref 0.0–0.5)
Eosinophils Relative: 1 %
HCT: 38 % (ref 36.0–46.0)
Hemoglobin: 12.8 g/dL (ref 12.0–15.0)
Immature Granulocytes: 1 %
Lymphocytes Relative: 21 %
Lymphs Abs: 2 10*3/uL (ref 0.7–4.0)
MCH: 29.7 pg (ref 26.0–34.0)
MCHC: 33.7 g/dL (ref 30.0–36.0)
MCV: 88.2 fL (ref 80.0–100.0)
Monocytes Absolute: 0.8 10*3/uL (ref 0.1–1.0)
Monocytes Relative: 8 %
Neutro Abs: 6.6 10*3/uL (ref 1.7–7.7)
Neutrophils Relative %: 69 %
Platelets: 311 10*3/uL (ref 150–400)
RBC: 4.31 MIL/uL (ref 3.87–5.11)
RDW: 13.5 % (ref 11.5–15.5)
WBC: 9.5 10*3/uL (ref 4.0–10.5)
nRBC: 0 % (ref 0.0–0.2)

## 2022-07-31 MED ORDER — PREDNISONE 20 MG PO TABS
ORAL_TABLET | ORAL | 0 refills | Status: DC
Start: 2022-07-31 — End: 2022-09-01

## 2022-07-31 MED ORDER — POTASSIUM CHLORIDE CRYS ER 20 MEQ PO TBCR
20.0000 meq | EXTENDED_RELEASE_TABLET | Freq: Once | ORAL | Status: AC
  Administered 2022-07-31: 20 meq via ORAL
  Filled 2022-07-31: qty 1

## 2022-07-31 MED ORDER — PREDNISONE 20 MG PO TABS: ORAL_TABLET | ORAL | 0 refills | Status: DC

## 2022-07-31 NOTE — Addendum Note (Signed)
Addended by: Venia Carbon K on: 07/31/2022 03:40 PM   Modules accepted: Orders

## 2022-07-31 NOTE — Progress Notes (Signed)
Subjective:    Patient ID: Mary Blevins, female    DOB: August 04, 1945, 77 y.o.   MRN: [id number] Patient went to the emergency room this morning.  She states that her right knee simply gave out on her causing her to fall.  She states that after that she became extremely anxious and short of breath and needed to go to the hospital.  In the hospital her chest x-ray was normal.  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  Other lab work was significant for mild hyponatremia and a mildly suppressed potassium.  Patient continues to report feeling short of breath today.  She has not used her albuterol inhaler.  She denies any fever or chills or purulent sputum or chest pain Past Medical History:  Diagnosis Date   Arthritis    COPD (chronic obstructive pulmonary disease) (HCC)    Dyspnea    Elevated lipids    Generalized headaches    History of pneumonia    Hyperlipidemia    Hypertension    Hypothyroidism    MVP (mitral valve prolapse)    palpitations   Osteopenia    Seasonal allergies    Sinusitis    Tubular adenoma of colon    Past Surgical History:  Procedure Laterality Date   CATARACT EXTRACTION Left    COLONOSCOPY WITH PROPOFOL N/A 01/06/2016   Procedure: COLONOSCOPY WITH PROPOFOL;  Surgeon: Sherrilyn Rist, MD;  Location: [facility];  Service: Gastroenterology;  Laterality: N/A;   DILATION AND CURETTAGE OF UTERUS     THYROIDECTOMY     TONSILLECTOMY     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   Procedure: RIGHT TOTAL HIP ARTHROPLASTY ANTERIOR APPROACH;  Surgeon: Kathryne Hitch, MD;  Location: [facility];  Service: Orthopedics;  Laterality: Right;  Needs RNFA   Current Outpatient Medications on File Prior to Visit  Medication Sig Dispense Refill   albuterol (PROVENTIL) (2.5 MG/3ML) 0.083% nebulizer solution INHALE 3 ML BY NEBULIZATION EVERY 6 HOURS AS NEEDED FOR WHEEZING OR SHORTNESS OF BREATH 300 mL 3   albuterol (VENTOLIN HFA)  108 (90 Base) MCG/ACT inhaler Inhale 2 puffs into the lungs every 6 (six) hours as needed for wheezing or shortness of breath. 8 g 6   budesonide-formoterol (SYMBICORT) 160-4.5 MCG/ACT inhaler Inhale 2 puffs into the lungs 2 (two) times daily. 6 g 0   diclofenac (VOLTAREN) 75 MG EC tablet TAKE 1 TABLET BY MOUTH TWICE A DAY 60 tablet 1   DULoxetine (CYMBALTA) 60 MG capsule      fluticasone (FLONASE) 50 MCG/ACT nasal spray SPRAY 2 SPRAYS INTO EACH NOSTRIL EVERY DAY 16 g 2   gabapentin (NEURONTIN) 300 MG capsule TAKE 1 CAPSULE BY MOUTH EVERY DAY 90 capsule 3   guaiFENesin (MUCINEX) 600 MG 12 hr tablet Take 1 tablet (600 mg total) by mouth 2 (two) times daily. 14 tablet 0   hydrochlorothiazide (HYDRODIURIL) 25 MG tablet Take 1 tablet (25 mg total) by mouth daily. 90 tablet 3   HYDROcodone bit-homatropine (HYCODAN) 5-1.5 MG/5ML syrup Take 5 mLs by mouth every 8 (eight) hours as needed for cough. 120 mL 0   HYDROcodone-acetaminophen (NORCO) 7.5-325 MG tablet Take 1 tablet by mouth every 6 (six) hours as needed for moderate pain. 30 tablet 0   hydrocortisone 2.5 % cream APPLY TO AFFECTED AREA TWICE A DAY 28 g 1   hydrOXYzine (ATARAX) 25 MG tablet TAKE 1 TABLET BY MOUTH 3 TIMES DAILY AS NEEDED FOR  ITCHING. 270 tablet 1   levothyroxine (SYNTHROID) 50 MCG tablet TAKE 1 TABLET BY MOUTH EVERY DAY BEFORE BREAKFAST 90 tablet 3   montelukast (SINGULAIR) 10 MG tablet TAKE 1 TABLET BY MOUTH EVERYDAY AT BEDTIME 90 tablet 0   rosuvastatin (CRESTOR) 5 MG tablet TAKE 1 TABLET (5 MG TOTAL) BY MOUTH DAILY. 90 tablet 3   sodium chloride (OCEAN) 0.65 % SOLN nasal spray Place 1 spray into both nostrils daily.     Tiotropium Bromide Monohydrate (SPIRIVA RESPIMAT) 2.5 MCG/ACT AERS Inhale 2 puffs into the lungs daily. 4 g 11   Tiotropium Bromide Monohydrate (SPIRIVA RESPIMAT) 2.5 MCG/ACT AERS Inhale 2 puffs into the lungs daily. 4 g 0   tiZANidine (ZANAFLEX) 4 MG tablet TAKE 1 TABLET BY MOUTH EVERY 6 HOURS AS NEEDED FOR MUSCLE  SPASMS. 30 tablet 0   verapamil (CALAN-SR) 120 MG CR tablet TAKE 1 TABLET EVERY DAY 90 tablet 3   zolpidem (AMBIEN) 10 MG tablet Take 0.5 tablets (5 mg total) by mouth at bedtime as needed for sleep. 15 tablet 1   No current facility-administered medications on file prior to visit.   Allergies  Allergen Reactions   Oxycodone Other (See Comments)    Hallucinations    Meloxicam Other (See Comments)    BAD DREAMS, SAD THOUGHTS   Social History   Socioeconomic History   Marital status: Widowed    Spouse name: Not on file   Number of children: 0   Years of education: 51 B   Highest education level: Not on file  Occupational History    Employer: [employer]  Tobacco Use   Smoking status: Former    Types: Cigarettes    Quit date: 07/24/2005    Years since quitting: 17.0   Smokeless tobacco: Never  Vaping Use   Vaping Use: Never used  Substance and Sexual Activity   Alcohol use: Yes    Comment: rare   Drug use: No   Sexual activity: Not on file  Other Topics Concern   Not on file  Social History Narrative   Lives alone   Social Determinants of Health   Financial Resource Strain: Low Risk  (12/29/2021)   Overall Financial Resource Strain (CARDIA)    Difficulty of Paying Living Expenses: Not hard at all  Food Insecurity: No Food Insecurity (12/29/2021)   Hunger Vital Sign    Worried About Running Out of Food in the Last Year: Never true    Ran Out of Food in the Last Year: Never true  Transportation Needs: No Transportation Needs (12/29/2021)   PRAPARE - Administrator, Civil Service (Medical): No    Lack of Transportation (Non-Medical): No  Physical Activity: Unknown (12/29/2021)   Exercise Vital Sign    Days of Exercise per Week: 0 days    Minutes of Exercise per Session: Not on file  Stress: No Stress Concern Present (12/29/2021)   [employer] of Occupational Health - Occupational Stress Questionnaire    Feeling of Stress : Not at all   Social Connections: Moderately Integrated (12/29/2021)   Social Connection and Isolation Panel [NHANES]    Frequency of Communication with Friends and Family: More than three times a week    Frequency of Social Gatherings with Friends and Family: Three times a week    Attends Religious Services: More than 4 times per year    Active Member of Clubs or Organizations: Yes    Attends Banker Meetings: More than 4  times per year    Marital Status: Widowed  Intimate Partner Violence: Not At Risk (12/29/2021)   Humiliation, Afraid, Rape, and Kick questionnaire    Fear of Current or Ex-Partner: No    Emotionally Abused: No    Physically Abused: No    Sexually Abused: No     Review of Systems  Respiratory:  Positive for cough and shortness of breath.   All other systems reviewed and are negative.      Objective:   Physical Exam Vitals reviewed.  Constitutional:      General: She is not in acute distress.    Appearance: She is well-developed and normal weight. She is not ill-appearing.  Cardiovascular:     Rate and Rhythm: Normal rate and regular rhythm.     Pulses: Normal pulses.     Heart sounds: Normal heart sounds. No murmur heard.    No friction rub. No gallop.  Pulmonary:     Effort: Pulmonary effort is normal. No tachypnea, accessory muscle usage, prolonged expiration or respiratory distress.     Breath sounds: Decreased air movement present. No stridor or transmitted upper airway sounds. Examination of the right-middle field reveals rales. Examination of the right-lower field reveals rales. Decreased breath sounds and rales present. No wheezing or rhonchi.  Musculoskeletal:     Right lower leg: No edema.     Left lower leg: No edema.  Neurological:     Mental Status: She is alert.           Assessment & Plan:   COPD with acute exacerbation [facility]) Patient has significant pain in her knee.  She is requesting a cortisone injection in the knee.  However I am  more concerned about her abnormal breath sounds particular in the right lung.  I recommended that she start albuterol 2 puffs every 8 hours.  I want her to start using Mucinex and incentive spirometry.  Also begin a prednisone taper pack.  Recheck here on Thursday.  Consider cortisone injection in the knee at that time but I am concerned that she may be having an early exacerbation of her COPD based on her abnormal breath sounds

## 2022-07-31 NOTE — Discharge Instructions (Signed)
You were seen in the emergency department for shortness of breath and right knee pain.  You had x-rays of your chest and knee along with EKG and blood work.  Your symptoms had improved.  Please follow-up with your primary care doctor today as scheduled.  Continue your inhalers as directed by your doctor.  Return if any worsening or concerning symptoms

## 2022-07-31 NOTE — Congregational Nurse Program (Unsigned)
I spoke with patient after speaking with niece. She was alert and somewhat oriented some confusion about the day. The family is concerned about the health of Mary Blevins because of the poor living conditions. They stated the house is " has mole and rodents. The water is not working properly.They also state Mary Blevins has moments of confusion. They are trying to find her a place to move.

## 2022-07-31 NOTE — ED Provider Notes (Signed)
[place] EMERGENCY DEPARTMENT AT [facility] Provider Note   CSN: [id number] Arrival date & time: 07/31/22  0631     History  Chief Complaint  Patient presents with   Anxiety    Pt ambulated from EMS stretcher to bed. Ems stated that pt called due to feeling anxious at home which started around 0100. Pt is also stating that she was also feeling anxious when all of this began, but did not take her anxiety medications. Pt has home O2 PRN and decided to try wearing it to see if it made her anxiety any better. Pt wants to be checked out for anxiety. All vitals stable with EMS.     Mary Blevins is a 77 y.o. female.  She is presenting by ambulance after stating she woke up around 1 AM feeling short of breath.  It made her very anxious and she could not calm herself down, tried her oxygen.  Still felt bad so called the ambulance.  She said she is coming down now and feeling a little better.  No cough or fever no chest pain abdominal pain vomiting diarrhea.  She also tells me her right knee gave out on her 4 days ago and her knees been hurting her.  She supposed to see her primary care doctor to get a shot but has not seen them yet.  The history is provided by the patient.  Shortness of Breath Severity:  Severe Onset quality:  Sudden Duration:  6 hours Timing:  Constant Progression:  Improving Chronicity:  Recurrent Relieved by:  Nothing Worsened by:  Nothing Ineffective treatments:  Oxygen Associated symptoms: no abdominal pain, no chest pain, no cough, no fever, no hemoptysis, no sputum production and no vomiting   Risk factors: no tobacco use        Home Medications Prior to Admission medications   Medication Sig Start Date End Date Taking? Authorizing Provider  albuterol (PROVENTIL) (2.5 MG/3ML) 0.083% nebulizer solution INHALE 3 ML BY NEBULIZATION EVERY 6 HOURS AS NEEDED FOR WHEEZING OR SHORTNESS OF BREATH 07/07/22   Donita Brooks, MD  albuterol (VENTOLIN  HFA) 108 (90 Base) MCG/ACT inhaler Inhale 2 puffs into the lungs every 6 (six) hours as needed for wheezing or shortness of breath. 01/27/22   Coralyn Helling, MD  budesonide-formoterol (SYMBICORT) 160-4.5 MCG/ACT inhaler Inhale 2 puffs into the lungs 2 (two) times daily. 10/10/21   Coralyn Helling, MD  diclofenac (VOLTAREN) 75 MG EC tablet TAKE 1 TABLET BY MOUTH TWICE A DAY 07/17/22   Donita Brooks, MD  DULoxetine (CYMBALTA) 60 MG capsule  11/09/20   [provider]  fluticasone (FLONASE) 50 MCG/ACT nasal spray SPRAY 2 SPRAYS INTO EACH NOSTRIL EVERY DAY 07/11/18   Donita Brooks, MD  gabapentin (NEURONTIN) 300 MG capsule TAKE 1 CAPSULE BY MOUTH EVERY DAY 06/23/22   Donita Brooks, MD  guaiFENesin (MUCINEX) 600 MG 12 hr tablet Take 1 tablet (600 mg total) by mouth 2 (two) times daily. 10/29/21   Small, Brooke L, PA  hydrochlorothiazide (HYDRODIURIL) 25 MG tablet Take 1 tablet (25 mg total) by mouth daily. 03/03/22   Donita Brooks, MD  HYDROcodone bit-homatropine (HYCODAN) 5-1.5 MG/5ML syrup Take 5 mLs by mouth every 8 (eight) hours as needed for cough. 09/27/21   Donita Brooks, MD  HYDROcodone-acetaminophen (NORCO) 7.5-325 MG tablet Take 1 tablet by mouth every 6 (six) hours as needed for moderate pain. 07/07/22   Donita Brooks, MD  hydrocortisone 2.5 %  cream APPLY TO AFFECTED AREA TWICE A DAY 07/07/22   Donita Brooks, MD  hydrOXYzine (ATARAX) 25 MG tablet TAKE 1 TABLET BY MOUTH 3 TIMES DAILY AS NEEDED FOR ITCHING. 07/12/22   Donita Brooks, MD  levothyroxine (SYNTHROID) 50 MCG tablet TAKE 1 TABLET BY MOUTH EVERY DAY BEFORE BREAKFAST 12/12/21   Donita Brooks, MD  montelukast (SINGULAIR) 10 MG tablet TAKE 1 TABLET BY MOUTH EVERYDAY AT BEDTIME 06/12/22   Donita Brooks, MD  rosuvastatin (CRESTOR) 5 MG tablet TAKE 1 TABLET (5 MG TOTAL) BY MOUTH DAILY. 09/16/21   Lyn Records, MD  sodium chloride (OCEAN) 0.65 % SOLN nasal spray Place 1 spray into both nostrils daily.    [provider]  Tiotropium Bromide Monohydrate (SPIRIVA RESPIMAT) 2.5 MCG/ACT AERS Inhale 2 puffs into the lungs daily. 01/04/22   Coralyn Helling, MD  Tiotropium Bromide Monohydrate (SPIRIVA RESPIMAT) 2.5 MCG/ACT AERS Inhale 2 puffs into the lungs daily. 06/20/22   Coralyn Helling, MD  tiZANidine (ZANAFLEX) 4 MG tablet TAKE 1 TABLET BY MOUTH EVERY 6 HOURS AS NEEDED FOR MUSCLE SPASMS. 06/28/22   Donita Brooks, MD  verapamil (CALAN-SR) 120 MG CR tablet TAKE 1 TABLET EVERY DAY 01/19/22   Donita Brooks, MD  zolpidem (AMBIEN) 10 MG tablet Take 0.5 tablets (5 mg total) by mouth at bedtime as needed for sleep. 10/31/21 12/30/21  Donita Brooks, MD      Allergies    Oxycodone and Meloxicam    Review of Systems   Review of Systems  Constitutional:  Negative for fever.  Respiratory:  Positive for shortness of breath. Negative for cough, hemoptysis and sputum production.   Cardiovascular:  Negative for chest pain.  Gastrointestinal:  Negative for abdominal pain and vomiting.  Musculoskeletal:  Positive for gait problem.    Physical Exam Updated Vital Signs BP (!) 149/82 (BP Location: Right Arm)   Pulse 68   Temp 98.2 F (36.8 C) (Oral)   Resp 16   Ht 5\' 5"  (1.651 m)   Wt 72.6 kg   SpO2 96%   BMI 26.63 kg/m  Physical Exam Vitals and nursing note reviewed.  Constitutional:      General: She is not in acute distress.    Appearance: Normal appearance. She is well-developed.  HENT:     Head: Normocephalic and atraumatic.  Eyes:     Conjunctiva/sclera: Conjunctivae normal.  Cardiovascular:     Rate and Rhythm: Normal rate and regular rhythm.     Heart sounds: No murmur heard. Pulmonary:     Effort: Pulmonary effort is normal. No respiratory distress.     Breath sounds: Normal breath sounds.  Abdominal:     Palpations: Abdomen is soft.     Tenderness: There is no abdominal tenderness. There is no guarding or rebound.  Musculoskeletal:        General: Tenderness present. No  deformity. Normal range of motion.     Cervical back: Neck supple.     Comments: She has some diffuse tenderness around her right knee.  There is no significant swelling.  No ligamentous laxity.  Distal pulses intact.  Skin:    General: Skin is warm and dry.     Capillary Refill: Capillary refill takes less than 2 seconds.  Neurological:     General: No focal deficit present.     Mental Status: She is alert.     Motor: No weakness.     ED Results / Procedures /  Treatments   Labs (all labs ordered are listed, but only abnormal results are displayed) Labs Reviewed  BASIC METABOLIC PANEL - Abnormal; Notable for the following components:      Result Value   Sodium 132 (*)    Potassium 3.3 (*)    Glucose, Bld 109 (*)    Calcium 8.7 (*)    All other components within normal limits  CBC WITH DIFFERENTIAL/PLATELET    EKG EKG Interpretation Date/Time:  Monday July 31 2022 06:37:52 EDT Ventricular Rate:  72 PR Interval:  134 QRS Duration:  102 QT Interval:  397 QTC Calculation: 435 R Axis:   75  Text Interpretation: Sinus rhythm Ventricular premature complex Probable anteroseptal infarct, old Confirmed by Gilda Crease [phone]) on 07/31/2022 6:40:20 AM  Radiology DG Knee Complete 4 Views Right  Result Date: 07/31/2022 CLINICAL DATA:  Shortness of breath.  Fall with knee pain. EXAM: RIGHT KNEE - COMPLETE 4 VIEW COMPARISON:  None Available. FINDINGS: No evidence of fracture, dislocation, or joint effusion. Tricompartmental degenerative spurring with lateral compartment collapse. Subjective osteopenia. IMPRESSION: No acute finding. 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 Electronically Signed   By: Tiburcio Pea M.D.   On: 07/31/2022 07:47   DG Chest 2 View  Result Date: 07/31/2022 CLINICAL DATA:  Shortness of breath. EXAM: CHEST - 2 VIEW COMPARISON:  02/20/2022 FINDINGS: Normal heart size and mediastinal contours. No acute infiltrate or edema. No  effusion or pneumothorax. No acute osseous findings. IMPRESSION: No active cardiopulmonary disease. Electronically Signed   By: Tiburcio Pea M.D.   On: 07/31/2022 07:46    Procedures Procedures    Medications Ordered in ED Medications  potassium chloride SA (KLOR-CON M) CR tablet 20 mEq (20 mEq Oral Given 07/31/22 0941)    ED Course/ Medical Decision Making/ A&P Clinical Course as of 07/31/22 1748  Mon Jul 31, 2022  0752 X-rays of right knee and chest do not show any acute infiltrate no acute fracture.  Does have significant degenerative disease. [MB]    Clinical Course User Index [MB] Terrilee Files, MD                             Medical Decision Making Amount and/or Complexity of Data Reviewed Labs: ordered. Radiology: ordered.  Risk Prescription drug management.   This patient complains of shortness of breath right knee pain; this involves an extensive number of treatment Options and is a complaint that carries with it a high risk of complications and morbidity. The differential includes pneumonia, COPD, pneumothorax, anxiety, reflux, musculoskeletal pain, PE DVT  I ordered, reviewed and interpreted labs, which included CBC normal, chemistries with mildly low sodium low potassium elevated glucose I ordered medication oral potassium and reviewed PMP when indicated. I ordered imaging studies which included chest x-ray and right knee x-ray and I independently    visualized and interpreted imaging which showed degenerative changes and need no definite infiltrate Additional history obtained from EMS Previous records obtained and reviewed in epic including recent PCP notes Cardiac monitoring reviewed, normal sinus rhythm Social determinants considered, no significant barriers Critical Interventions: None  After the interventions stated above, I reevaluated the patient and found patient to be oxygenating well on room air.  She is ambulatory in the department with her  cane. Admission and further testing considered, no indications for admission or further workup at this time.  She has an appointment with her PCP this afternoon.  Recommended  she continues appointment and return instructions discussed.         Final Clinical Impression(s) / ED Diagnoses Final diagnoses:  Shortness of breath  Acute pain of right knee    Rx / DC Orders ED Discharge Orders     None         Terrilee Files, MD 07/31/22 1750

## 2022-08-03 ENCOUNTER — Ambulatory Visit (INDEPENDENT_AMBULATORY_CARE_PROVIDER_SITE_OTHER): Payer: Medicare HMO | Admitting: Family Medicine

## 2022-08-03 ENCOUNTER — Encounter: Payer: Self-pay | Admitting: Family Medicine

## 2022-08-03 VITALS — BP 136/82 | HR 85 | Temp 98.5°F | Ht 65.0 in | Wt 150.0 lb

## 2022-08-03 DIAGNOSIS — M25561 Pain in right knee: Secondary | ICD-10-CM

## 2022-08-03 MED ORDER — LIDOCAINE HCL (PF) 1 % IJ SOLN
2.0000 mL | Freq: Once | INTRAMUSCULAR | Status: AC
Start: 2022-08-03 — End: 2022-08-03
  Administered 2022-08-03: 2 mL

## 2022-08-03 MED ORDER — TRIAMCINOLONE ACETONIDE 40 MG/ML IJ SUSP
80.0000 mg | Freq: Once | INTRAMUSCULAR | Status: AC
Start: 2022-08-03 — End: 2022-08-03
  Administered 2022-08-03: 80 mg via INTRA_ARTICULAR

## 2022-08-03 MED ORDER — BUPIVACAINE HCL 0.25 % IJ SOLN
2.0000 mL | Freq: Once | INTRAMUSCULAR | Status: AC
Start: 2022-08-03 — End: 2022-08-03
  Administered 2022-08-03: 2 mL via INTRA_ARTICULAR

## 2022-08-03 MED ORDER — HYDROCODONE-ACETAMINOPHEN 7.5-325 MG PO TABS: 1.0000 | ORAL_TABLET | Freq: Four times a day (QID) | ORAL | 0 refills | Status: DC | PRN

## 2022-08-03 NOTE — Progress Notes (Signed)
Subjective:    Patient ID: Mary Blevins, female    DOB: September 12, 1945, 77 y.o.   MRN: [id number]  07/31/22 Patient went to the emergency room this morning.  She states that her right knee simply gave out on her causing her to fall.  She states that after that she became extremely anxious and short of breath and needed to go to the hospital.  In the hospital her chest x-ray was normal.  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  Other lab work was significant for mild hyponatremia and a mildly suppressed potassium.  Patient continues to report feeling short of breath today.  She has not used her albuterol inhaler.  She denies any fever or chills or purulent sputum or chest pain.  At that time, my plan was: Patient has significant pain in her knee.  She is requesting a cortisone injection in the knee.  However I am more concerned about her abnormal breath sounds particular in the right lung.  I recommended that she start albuterol 2 puffs every 8 hours.  I want her to start using Mucinex and incentive spirometry.  Also begin a prednisone taper pack.  Recheck here on Thursday.  Consider cortisone injection in the knee at that time but I am concerned that she may be having an early exacerbation of her COPD based on her abnormal breath sounds  08/03/22 Patient states that her breathing has improved.  She denies any fevers or chills.  Her shortness of breath is at her baseline.  However she continues to have pain in her right knee. Past Medical History:  Diagnosis Date   Arthritis    COPD (chronic obstructive pulmonary disease) (HCC)    Dyspnea    Elevated lipids    Generalized headaches    History of pneumonia    Hyperlipidemia    Hypertension    Hypothyroidism    MVP (mitral valve prolapse)    palpitations   Osteopenia    Seasonal allergies    Sinusitis    Tubular adenoma of colon    Past Surgical History:  Procedure Laterality Date   CATARACT  EXTRACTION Left    COLONOSCOPY WITH PROPOFOL N/A 01/06/2016   Procedure: COLONOSCOPY WITH PROPOFOL;  Surgeon: Sherrilyn Rist, MD;  Location: [facility];  Service: Gastroenterology;  Laterality: N/A;   DILATION AND CURETTAGE OF UTERUS     THYROIDECTOMY     TONSILLECTOMY     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   Procedure: RIGHT TOTAL HIP ARTHROPLASTY ANTERIOR APPROACH;  Surgeon: Kathryne Hitch, MD;  Location: [facility];  Service: Orthopedics;  Laterality: Right;  Needs RNFA   Current Outpatient Medications on File Prior to Visit  Medication Sig Dispense Refill   albuterol (PROVENTIL) (2.5 MG/3ML) 0.083% nebulizer solution INHALE 3 ML BY NEBULIZATION EVERY 6 HOURS AS NEEDED FOR WHEEZING OR SHORTNESS OF BREATH 300 mL 3   albuterol (VENTOLIN HFA) 108 (90 Base) MCG/ACT inhaler Inhale 2 puffs into the lungs every 6 (six) hours as needed for wheezing or shortness of breath. 8 g 6   budesonide-formoterol (SYMBICORT) 160-4.5 MCG/ACT inhaler Inhale 2 puffs into the lungs 2 (two) times daily. 6 g 0   diclofenac (VOLTAREN) 75 MG EC tablet TAKE 1 TABLET BY MOUTH TWICE A DAY 60 tablet 1   DULoxetine (CYMBALTA) 60 MG capsule      fluticasone (FLONASE) 50 MCG/ACT nasal spray SPRAY 2 SPRAYS INTO EACH NOSTRIL EVERY DAY 16  g 2   gabapentin (NEURONTIN) 300 MG capsule TAKE 1 CAPSULE BY MOUTH EVERY DAY 90 capsule 3   guaiFENesin (MUCINEX) 600 MG 12 hr tablet Take 1 tablet (600 mg total) by mouth 2 (two) times daily. 14 tablet 0   hydrochlorothiazide (HYDRODIURIL) 25 MG tablet Take 1 tablet (25 mg total) by mouth daily. 90 tablet 3   HYDROcodone bit-homatropine (HYCODAN) 5-1.5 MG/5ML syrup Take 5 mLs by mouth every 8 (eight) hours as needed for cough. 120 mL 0   HYDROcodone-acetaminophen (NORCO) 7.5-325 MG tablet Take 1 tablet by mouth every 6 (six) hours as needed for moderate pain. 30 tablet 0   hydrocortisone 2.5 % cream APPLY TO AFFECTED AREA TWICE A DAY 28 g 1   hydrOXYzine (ATARAX) 25 MG tablet  TAKE 1 TABLET BY MOUTH 3 TIMES DAILY AS NEEDED FOR ITCHING. 270 tablet 1   levothyroxine (SYNTHROID) 50 MCG tablet TAKE 1 TABLET BY MOUTH EVERY DAY BEFORE BREAKFAST 90 tablet 3   montelukast (SINGULAIR) 10 MG tablet TAKE 1 TABLET BY MOUTH EVERYDAY AT BEDTIME 90 tablet 0   predniSONE (DELTASONE) 20 MG tablet 3 tabs poqday 1-2, 2 tabs poqday 3-4, 1 tab poqday 5-6 12 tablet 0   rosuvastatin (CRESTOR) 5 MG tablet TAKE 1 TABLET (5 MG TOTAL) BY MOUTH DAILY. 90 tablet 3   sodium chloride (OCEAN) 0.65 % SOLN nasal spray Place 1 spray into both nostrils daily.     Tiotropium Bromide Monohydrate (SPIRIVA RESPIMAT) 2.5 MCG/ACT AERS Inhale 2 puffs into the lungs daily. 4 g 11   Tiotropium Bromide Monohydrate (SPIRIVA RESPIMAT) 2.5 MCG/ACT AERS Inhale 2 puffs into the lungs daily. 4 g 0   tiZANidine (ZANAFLEX) 4 MG tablet TAKE 1 TABLET BY MOUTH EVERY 6 HOURS AS NEEDED FOR MUSCLE SPASMS. 30 tablet 0   verapamil (CALAN-SR) 120 MG CR tablet TAKE 1 TABLET EVERY DAY 90 tablet 3   zolpidem (AMBIEN) 10 MG tablet Take 0.5 tablets (5 mg total) by mouth at bedtime as needed for sleep. 15 tablet 1   No current facility-administered medications on file prior to visit.   Allergies  Allergen Reactions   Oxycodone Other (See Comments)    Hallucinations    Meloxicam Other (See Comments)    BAD DREAMS, SAD THOUGHTS   Social History   Socioeconomic History   Marital status: Widowed    Spouse name: Not on file   Number of children: 0   Years of education: 59 B   Highest education level: Not on file  Occupational History    Employer: [employer]  Tobacco Use   Smoking status: Former    Current packs/day: 0.00    Types: Cigarettes    Quit date: 07/24/2005    Years since quitting: 17.0   Smokeless tobacco: Never  Vaping Use   Vaping status: Never Used  Substance and Sexual Activity   Alcohol use: Yes    Comment: rare   Drug use: No   Sexual activity: Not on file  Other Topics Concern   Not  on file  Social History Narrative   Lives alone   Social Determinants of Health   Financial Resource Strain: Low Risk  (12/29/2021)   Overall Financial Resource Strain (CARDIA)    Difficulty of Paying Living Expenses: Not hard at all  Food Insecurity: No Food Insecurity (12/29/2021)   Hunger Vital Sign    Worried About Running Out of Food in the Last Year: Never true    Ran Out of  Food in the Last Year: Never true  Transportation Needs: No Transportation Needs (12/29/2021)   PRAPARE - Administrator, Civil Service (Medical): No    Lack of Transportation (Non-Medical): No  Physical Activity: Unknown (12/29/2021)   Exercise Vital Sign    Days of Exercise per Week: 0 days    Minutes of Exercise per Session: Not on file  Stress: No Stress Concern Present (12/29/2021)   [employer] of Occupational Health - Occupational Stress Questionnaire    Feeling of Stress : Not at all  Social Connections: Moderately Integrated (12/29/2021)   Social Connection and Isolation Panel [NHANES]    Frequency of Communication with Friends and Family: More than three times a week    Frequency of Social Gatherings with Friends and Family: Three times a week    Attends Religious Services: More than 4 times per year    Active Member of Clubs or Organizations: Yes    Attends Banker Meetings: More than 4 times per year    Marital Status: Widowed  Intimate Partner Violence: Not At Risk (12/29/2021)   Humiliation, Afraid, Rape, and Kick questionnaire    Fear of Current or Ex-Partner: No    Emotionally Abused: No    Physically Abused: No    Sexually Abused: No     Review of Systems  Respiratory:  Positive for cough and shortness of breath.   All other systems reviewed and are negative.      Objective:   Physical Exam Vitals reviewed.  Constitutional:      General: She is not in acute distress.    Appearance: She is well-developed and normal weight. She is not ill-appearing.   Cardiovascular:     Rate and Rhythm: Normal rate and regular rhythm.     Pulses: Normal pulses.     Heart sounds: Normal heart sounds. No murmur heard.    No friction rub. No gallop.  Pulmonary:     Effort: Pulmonary effort is normal. No tachypnea, accessory muscle usage, prolonged expiration or respiratory distress.     Breath sounds: Decreased air movement present. No stridor or transmitted upper airway sounds. Examination of the right-middle field reveals rales. Examination of the right-lower field reveals rales. Decreased breath sounds and rales present. No wheezing or rhonchi.  Musculoskeletal:     Right knee: Decreased range of motion. Tenderness present over the medial joint line and lateral joint line.     Right lower leg: No edema.     Left lower leg: No edema.  Neurological:     Mental Status: She is alert.           Assessment & Plan:    Acute pain of right knee Patient's breathing is better.  She continues to have severe pain in her right knee.  She is requesting a refill on her pain medication.  Using sterile technique, I injected the right knee with 2 cc of lidocaine, 2 cc of Marcaine, and 2 cc of 40 mg/mm Kenalog.  The patient tolerated procedure well without complication.

## 2022-08-03 NOTE — Addendum Note (Signed)
Addended by: Venia Carbon K on: 08/03/2022 11:19 AM   Modules accepted: Orders

## 2022-08-04 ENCOUNTER — Encounter: Payer: Self-pay | Admitting: Pulmonary Disease

## 2022-08-04 ENCOUNTER — Ambulatory Visit: Payer: Medicare HMO | Admitting: Pulmonary Disease

## 2022-08-04 ENCOUNTER — Other Ambulatory Visit: Payer: Self-pay | Admitting: Family Medicine

## 2022-08-04 ENCOUNTER — Telehealth: Payer: Self-pay | Admitting: Family Medicine

## 2022-08-04 VITALS — BP 132/78 | HR 74 | Ht 65.0 in | Wt 149.0 lb

## 2022-08-04 DIAGNOSIS — J432 Centrilobular emphysema: Secondary | ICD-10-CM

## 2022-08-04 DIAGNOSIS — G4722 Circadian rhythm sleep disorder, advanced sleep phase type: Secondary | ICD-10-CM | POA: Diagnosis not present

## 2022-08-04 MED ORDER — HYDROCODONE-ACETAMINOPHEN 7.5-325 MG PO TABS: 1.0000 | ORAL_TABLET | Freq: Four times a day (QID) | ORAL | 0 refills | Status: DC | PRN

## 2022-08-04 NOTE — Progress Notes (Signed)
[facility]  Chief Complaint  Patient presents with   Follow-up    Breathing is overall doing well. She has occ wheezing- uses her albuterol inhaler 3 x daily on average. Her cough is prod with clear sputum.     Constitutional:  BP 132/78 (BP Location: Left Arm, Cuff Size: Normal)   Pulse 74   Ht 5\' 5"  (1.651 m)   Wt 149 lb (67.6 kg)   SpO2 97% Comment: on RA  BMI 24.79 kg/m   Past Medical History:  HLD, HA, PNA, Hypothyroidism, Osteopenia, Allergies, Colon polyp, COVID 19 infection January 2021, Anxiety, Cataracts  Past Surgical History:  She  has a past surgical history that includes Thyroidectomy; Dilation and curettage of uterus; Tonsillectomy; Colonoscopy with propofol (N/A, 01/06/2016); Cataract extraction (Left); and Total hip arthroplasty (Right, 09/24/2020).  Brief Summary:  Mary Blevins is a 77 y.o. female former smoker with COPD/emphysema.      Subjective:   She fell at home earlier this month and went to the ER.  Chest xray from 07/31/22 was normal.  She was started on prednisone for right knee pain and swelling.  Has intermittent cough and wheeze with clear sputum.  Albuterol helps.  Not having sinus congestion.  Keeps up with activity.  Sleeping okay.  She goes to bed at 8 pm and wakes up between 1 and 2 am.  Feels rested and doesn't needed to nap.  Her aunt was recently enrolled in hospice.  It has been difficult watching her go through this process.  Physical Exam:   Appearance - well kempt   ENMT - no sinus tenderness, no oral exudate, no LAN, Mallampati 3 airway, no stridor, wears dentures  Respiratory - equal breath sounds bilaterally, no wheezing or rales  CV - s1s2 regular rate and rhythm, no murmurs  Ext - no clubbing, no edema  Skin - no rashes  Psych - normal mood and affect     Pulmonary testing:  PFT 03/01/17 >> FEV1 0.99 (51%), FEV1% 49, TLC 6.41 (121%), DLCO 51%, no BD A1AT 06/02/19 >>  102, MM  Social History:  She  reports that she quit smoking about 17 years ago. Her smoking use included cigarettes. She has never used smokeless tobacco. She reports current alcohol use. She reports that she does not use drugs.  Family History:  Her family history includes Breast cancer in her maternal aunt; Dementia in her mother; Diabetes in her brother; Healthy in her father and sister; Heart attack in her maternal grandmother, mother, and another family member; Heart disease in her mother; Prostate cancer in her maternal grandfather.     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 - trelegy and breztri weren't effective - continue spiriva respimat 2.5 mcg two puffs daily, symbicort 160 two puffs bid, and singulair 10 mg qhs - prn albuterol and mucinex - discussed different roles for her inhalers and when to use them  Advanced sleep phase. - discussed proper sleep hygiene and setting a regular sleep-wake schedule - she isn't troubled by her current sleep pattern - discussed techniques to help phase shift her sleep cycle if she so desires   Time Spent Involved in Patient Care on Day of Examination:  28 minutes  Follow up:   Patient Instructions  Follow up in 1 year  Medication List:   Allergies as of 08/04/2022       Reactions   Oxycodone Other (See Comments)  Hallucinations   Meloxicam Other (See Comments)   BAD DREAMS, SAD THOUGHTS        Medication List        Accurate as of August 04, 2022 11:12 AM. If you have any questions, ask your nurse or doctor.          STOP taking these medications    rosuvastatin 5 MG tablet Commonly known as: CRESTOR Stopped by: Coralyn Helling       TAKE these medications    albuterol 108 (90 Base) MCG/ACT inhaler Commonly known as: VENTOLIN HFA Inhale 2 puffs into the lungs every 6 (six) hours as needed for wheezing or shortness of breath.   albuterol (2.5 MG/3ML) 0.083% nebulizer  solution Commonly known as: PROVENTIL INHALE 3 ML BY NEBULIZATION EVERY 6 HOURS AS NEEDED FOR WHEEZING OR SHORTNESS OF BREATH   budesonide-formoterol 160-4.5 MCG/ACT inhaler Commonly known as: SYMBICORT Inhale 2 puffs into the lungs 2 (two) times daily.   diclofenac 75 MG EC tablet Commonly known as: VOLTAREN TAKE 1 TABLET BY MOUTH TWICE A DAY   DULoxetine 60 MG capsule Commonly known as: CYMBALTA   fluticasone 50 MCG/ACT nasal spray Commonly known as: FLONASE SPRAY 2 SPRAYS INTO EACH NOSTRIL EVERY DAY   gabapentin 300 MG capsule Commonly known as: NEURONTIN TAKE 1 CAPSULE BY MOUTH EVERY DAY   guaiFENesin 600 MG 12 hr tablet Commonly known as: Mucinex Take 1 tablet (600 mg total) by mouth 2 (two) times daily.   hydrochlorothiazide 25 MG tablet Commonly known as: HYDRODIURIL Take 1 tablet (25 mg total) by mouth daily.   HYDROcodone bit-homatropine 5-1.5 MG/5ML syrup Commonly known as: HYCODAN Take 5 mLs by mouth every 8 (eight) hours as needed for cough.   HYDROcodone-acetaminophen 7.5-325 MG tablet Commonly known as: Norco Take 1 tablet by mouth every 6 (six) hours as needed for moderate pain.   hydrocortisone 2.5 % cream APPLY TO AFFECTED AREA TWICE A DAY   hydrOXYzine 25 MG tablet Commonly known as: ATARAX TAKE 1 TABLET BY MOUTH 3 TIMES DAILY AS NEEDED FOR ITCHING.   levothyroxine 50 MCG tablet Commonly known as: SYNTHROID TAKE 1 TABLET BY MOUTH EVERY DAY BEFORE BREAKFAST   montelukast 10 MG tablet Commonly known as: SINGULAIR TAKE 1 TABLET BY MOUTH EVERYDAY AT BEDTIME   predniSONE 20 MG tablet Commonly known as: DELTASONE 3 tabs poqday 1-2, 2 tabs poqday 3-4, 1 tab poqday 5-6   sodium chloride 0.65 % Soln nasal spray Commonly known as: OCEAN Place 1 spray into both nostrils daily.   Spiriva Respimat 2.5 MCG/ACT Aers Generic drug: Tiotropium Bromide Monohydrate Inhale 2 puffs into the lungs daily. What changed: Another medication with the same name  was removed. Continue taking this medication, and follow the directions you see here. Changed by: Coralyn Helling   tiZANidine 4 MG tablet Commonly known as: ZANAFLEX TAKE 1 TABLET BY MOUTH EVERY 6 HOURS AS NEEDED FOR MUSCLE SPASMS.   verapamil 120 MG CR tablet Commonly known as: CALAN-SR TAKE 1 TABLET EVERY DAY   zolpidem 10 MG tablet Commonly known as: AMBIEN Take 0.5 tablets (5 mg total) by mouth at bedtime as needed for sleep.        Signature:  Coralyn Helling, MD [facility] Pager - [phone] 08/04/2022, 11:12 AM

## 2022-08-04 NOTE — Patient Instructions (Signed)
Follow up in 1 year.

## 2022-08-04 NOTE — Telephone Encounter (Signed)
Patient left voicemail to follow up on refill request for HYDROcodone-acetaminophen (NORCO) 7.5-325 MG tablet   Patient stated refill request denied by pharmacy. Requesting for provider to follow up with pharmacy.   Pharmacy:   [id number] - [place], [place] - [address] AT [facility] [address], [place] [address] Phone: [phone]  Fax: [phone] DEA #: [id number]   Please advise pharmacist.

## 2022-08-08 NOTE — Patient Outreach (Signed)
Received a red flag Emmi notification . I have assigned Danford Bad, LCSW to call for follow up and determine if there are any needs.    Iverson Alamin, Donivan Scull [facility] Care Management Assistant [facility] Care Management [phone]

## 2022-08-09 ENCOUNTER — Ambulatory Visit: Payer: Self-pay | Admitting: *Deleted

## 2022-08-09 ENCOUNTER — Encounter: Payer: Self-pay | Admitting: *Deleted

## 2022-08-10 ENCOUNTER — Other Ambulatory Visit: Payer: Self-pay

## 2022-08-10 ENCOUNTER — Telehealth: Payer: Self-pay

## 2022-08-10 NOTE — Telephone Encounter (Signed)
Pt was called by Salem Senate, RN a Congregational [facility] Nurse, for a routine check, who wanted to let you know that the patient was asking about taking her Prednisone. Burna Mortimer states that she had discussed with the patient last week about how to take the Prednisone, before her last f/u visit with [place]. Burna Mortimer called the pt's niece to have her go check in on the patient as patient seemed confused. Thanks.

## 2022-08-10 NOTE — Progress Notes (Signed)
Mary Blevins called asking how to take prednisone 08/10/2022 She called on last week with the same question. The medication was prescribed on 07/08.

## 2022-08-10 NOTE — Patient Outreach (Signed)
Care Coordination   Initial Visit Note   08/10/2022 - Late Entry  Name: Mary Blevins MRN: [id number] DOB: [birth year]  Mary Blevins is a 77 y.o. year old female who sees Pickard, Priscille Heidelberg, MD for primary care. I spoke with Mary Blevins by phone today.  What matters to the patients health and wellness today?  Receive Counseling & Supportive Services.    Goals Addressed               This Visit's Progress     Receive Counseling & Supportive Services. (pt-stated)   On track     Care Coordination Interventions:  Interventions Today    Flowsheet Row Most Recent Value  Chronic Disease   Chronic disease during today's visit Chronic Obstructive Pulmonary Disease (COPD), Other  [Generalized Anxiety Disorder, Chronic Back Pain, Grief, & Recent Loss.]  General Interventions   General Interventions Discussed/Reviewed General Interventions Discussed, Labs, Vaccines, Doctor Visits, Communication with, Level of Care, [facility], Health Screening, Annual Foot Exam, Lipid Profile, General Interventions Reviewed, Annual Eye Exam, Durable Medical Equipment (DME)  [Encouraged]  Labs Hgb A1c every 3 months, Kidney Function  [Encouraged]  Vaccines COVID-19, Flu, Pneumonia, RSV, Shingles, Tetanus/Pertussis/Diphtheria  [Encouraged]  Doctor Visits Discussed/Reviewed Doctor Visits Discussed, Specialist, Doctor Visits Reviewed, Annual Wellness Visits, PCP  [Encouraged]  Health Screening Bone Density, Colonoscopy, Mammogram  [Encouraged]  Durable Medical Equipment (DME) BP Cuff, Other  [Cane & Eyeglasses]  PCP/Specialist Visits Compliance with follow-up visit  [Encouraged]  Communication with PCP/Specialists  [Encouraged]  Level of Care Applications, Assisted Living, Personal Care Services  [Encouraged]  Applications Medicaid, Personal Care Services  [Encouraged]  Exercise Interventions   Exercise Discussed/Reviewed Exercise Discussed, Assistive device use and maintanence,  Exercise Reviewed, Physical Activity, Weight Managment  [Encouraged]  Physical Activity Discussed/Reviewed Physical Activity Discussed, Physical Activity Reviewed, Types of exercise, Home Exercise Program (HEP)  [Encouraged]  Weight Management Weight loss  [Encouraged]  Education Interventions   Education Provided Provided Therapist, sports, Provided Web-based Education, Provided Education  [Encouraged]  Provided Verbal Education On Nutrition, Mental Health/Coping with Illness, When to see the doctor, Foot Care, Eye Care, Labs, Applications, Exercise, Medication, Development worker, community, [employer] Resources  [Encouraged]  Applications Medicaid, Personal Care Services  [Encouraged]  Mental Health Interventions   Mental Health Discussed/Reviewed Mental Health Discussed, Anxiety, Mental Health Reviewed, Coping Strategies, Crisis, Other, Suicide, Substance Abuse, Grief and Loss, Depression  [Domestic Violence]  Nutrition Interventions   Nutrition Discussed/Reviewed Nutrition Discussed, Adding fruits and vegetables, Increasing proteins, Decreasing fats, Decreasing salt, Decreasing sugar intake, Portion sizes, Carbohydrate meal planning, Fluid intake, Nutrition Reviewed  [Encouraged]  Pharmacy Interventions   Pharmacy Dicussed/Reviewed Medications and their functions, Pharmacy Topics Discussed, Medication Adherence, Pharmacy Topics Reviewed, Affording Medications  [Encouraged]  Safety Interventions   Safety Discussed/Reviewed Safety Discussed, Safety Reviewed, Fall Risk, Home Safety  [Encouraged]  Home Safety Assistive Devices, Need for home safety assessment, Refer for community resources  [Encouraged]  Advanced Directive Interventions   Advanced Directives Discussed/Reviewed Advanced Directives Discussed  [Encouraged]      Assessed Social Determinant of Health Barriers. Discussed Plans for Ongoing Care Management Follow Up. Provided Careers information officer Information for Care Management Team Members. Screened  for Signs & Symptoms of Depression, Related to Chronic Disease State.  PHQ2 & PHQ9 Depression Screen Completed & Results Reviewed.  Suicidal Ideation & Homicidal Ideation Assessed - None Present.   Domestic Violence Assessed - None Present. Access to Weapons Assessed - None Present.   Active Listening &  Reflection Utilized.  Verbalization of Feelings Encouraged.  Emotional Support Provided. Feelings of Sadness & Hopelessness Validated. Symptoms of Anxiety & Depression Acknowledged. Grief & Loss Support Groups Reviewed. Self-Enrollment in Grief & Loss Support Group of Interest Emphasized, from List Provided. Crisis Support Information, Agencies, Services, & Resources Discussed. Problem Solving Interventions Identified. Task-Centered Solutions Implemented.   Solution-Focused Strategies Developed. Acceptance & Commitment Therapy Introduced. Brief Cognitive Behavioral Therapy Initiated. Client-Centered Therapy Enacted. Reviewed Prescription Medications & Discussed Importance of Compliance. Quality of Sleep Assessed & Sleep Hygiene Techniques Promoted. Deep Breathing Exercises, Relaxation Techniques, & Mindfulness Meditation Strategies Taught & Implementation Encouraged Daily. Confirmed Reliable Means of Transportation, Environmental manager to [employer] Prescription Medications, Adequate Food & Nutrition, Significant Family Involvement & Support, & Ability to Pay Medical Expenses, Rent, & Utilities. Encouraged Contact with Psychiatrist of Interest in [facility], from List Provided, to Establish Psychotropic Medication Administration & Management, in An Effort to Reduce & Manage Symptoms of Anxiety & Depression. Encouraged Contact with Therapist of Interest in [facility], from List Provided, to Establish Psychotherapeutic Counseling & Supportive Services, in An Effort to Reduce & Manage Symptoms of Anxiety & Depression. Encouraged Contact with CSW (# [phone]) if You Have Questions,  Need Assistance, or If Additional Social Work Needs Are Identified Between Now & & Our Next Scheduled Follow-Up Outreach Call.        SDOH assessments and interventions completed:  Yes.  SDOH Interventions Today    Flowsheet Row Most Recent Value  SDOH Interventions   Food Insecurity Interventions Intervention Not Indicated  Housing Interventions Intervention Not Indicated  Transportation Interventions Intervention Not Indicated, Patient Resources (Friends/Family)  Utilities Interventions Intervention Not Indicated  Alcohol Usage Interventions Intervention Not Indicated (Score <7)  Financial Strain Interventions Intervention Not Indicated  Physical Activity Interventions Patient Declined  Stress Interventions Intervention Not Indicated  Social Connections Interventions Intervention Not Indicated  Health Literacy Interventions Intervention Not Indicated     Care Coordination Interventions:  Yes, provided.   Follow up plan: Follow up call scheduled for 08/23/2022 at 12:45 pm.   Encounter Outcome:  Pt. Visit Completed.   Danford Bad, BSW, MSW, LCSW  Licensed Restaurant manager, [employer] [facility]  Mailing [place] [address] [place], [place] [address] Physical Address-[address] [place], [place] [address] Toll Free Main # [phone] Fax # [phone] Cell # [phone] [email or web address]
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
## 2022-08-21 ENCOUNTER — Other Ambulatory Visit: Payer: Self-pay | Admitting: Family Medicine

## 2022-08-21 DIAGNOSIS — J441 Chronic obstructive pulmonary disease with (acute) exacerbation: Secondary | ICD-10-CM

## 2022-08-21 NOTE — Telephone Encounter (Signed)
Prescription Request  08/21/2022  LOV: 08/03/2022  What is the name of the medication or equipment?   predniSONE (DELTASONE) 20 MG tablet   Have you contacted your pharmacy to request a refill? Yes   Which pharmacy would you like this sent to?  [id number] - [place], [place] - [address] AT [facility] [address] [place] [address] Phone: [phone] Fax: [phone]    Patient notified that their request is being sent to the clinical staff for review and that they should receive a response within 2 business days.   Please advise pharmacist.

## 2022-08-21 NOTE — Telephone Encounter (Signed)
Requested medication (s) are due for refill today - no  Requested medication (s) are on the active medication list -yes  Future visit scheduled -no  Last refill: 07/31/22 #12  Notes to clinic: non delegated Rx  Requested Prescriptions  Pending Prescriptions Disp Refills   predniSONE (DELTASONE) 20 MG tablet 12 tablet 0    Sig: 3 tabs poqday 1-2, 2 tabs poqday 3-4, 1 tab poqday 5-6     Not Delegated - Endocrinology:  Oral Corticosteroids Failed - 08/21/2022 12:18 PM      Failed - This refill cannot be delegated      Failed - Manual Review: Eye exam for IOP if prolonged treatment      Failed - Glucose (serum) in normal range and within 180 days    Glucose, Bld  Date Value Ref Range Status  07/31/2022 109 (H) 70 - 99 mg/dL Final    Comment:    Glucose reference range applies only to samples taken after fasting for at least 8 hours.         Failed - K in normal range and within 180 days    Potassium  Date Value Ref Range Status  07/31/2022 3.3 (L) 3.5 - 5.1 mmol/L Final         Failed - Na in normal range and within 180 days    Sodium  Date Value Ref Range Status  07/31/2022 132 (L) 135 - 145 mmol/L Final         Failed - Valid encounter within last 6 months    Recent Outpatient Visits           1 year ago COPD with acute exacerbation (HCC)   [facility] Pickard, Priscille Heidelberg, MD   1 year ago COPD with acute exacerbation (HCC)   [facility] Donita Brooks, MD   1 year ago COPD with acute exacerbation (HCC)   [facility] Donita Brooks, MD   1 year ago Acute pain of left knee   [facility] Donita Brooks, MD   1 year ago Mixed simple and mucopurulent chronic bronchitis (HCC)   [facility] Valentino Nose, NP              Failed - Bone Mineral Density or Dexa Scan completed in the last 2 years      Passed - Last BP in normal range    BP Readings from Last 1 Encounters:   08/04/22 132/78            Requested Prescriptions  Pending Prescriptions Disp Refills   predniSONE (DELTASONE) 20 MG tablet 12 tablet 0    Sig: 3 tabs poqday 1-2, 2 tabs poqday 3-4, 1 tab poqday 5-6     Not Delegated - Endocrinology:  Oral Corticosteroids Failed - 08/21/2022 12:18 PM      Failed - This refill cannot be delegated      Failed - Manual Review: Eye exam for IOP if prolonged treatment      Failed - Glucose (serum) in normal range and within 180 days    Glucose, Bld  Date Value Ref Range Status  07/31/2022 109 (H) 70 - 99 mg/dL Final    Comment:    Glucose reference range applies only to samples taken after fasting for at least 8 hours.         Failed - K in normal range and within 180 days    Potassium  Date Value Ref Range Status  07/31/2022 3.3 (L) 3.5 - 5.1 mmol/L Final         Failed - Na in normal range and within 180 days    Sodium  Date Value Ref Range Status  07/31/2022 132 (L) 135 - 145 mmol/L Final         Failed - Valid encounter within last 6 months    Recent Outpatient Visits           1 year ago COPD with acute exacerbation (HCC)   [facility] Pickard, Priscille Heidelberg, MD   1 year ago COPD with acute exacerbation (HCC)   [facility] Pickard, Priscille Heidelberg, MD   1 year ago COPD with acute exacerbation [facility])   [facility] Pickard, Priscille Heidelberg, MD   1 year ago Acute pain of left knee   [facility] Donita Brooks, MD   1 year ago Mixed simple and mucopurulent chronic bronchitis (HCC)   [facility] Valentino Nose, NP              Failed - Bone Mineral Density or Dexa Scan completed in the last 2 years      Passed - Last BP in normal range    BP Readings from Last 1 Encounters:  08/04/22 132/78

## 2022-08-23 ENCOUNTER — Ambulatory Visit: Payer: Self-pay | Admitting: *Deleted

## 2022-08-23 ENCOUNTER — Encounter: Payer: Self-pay | Admitting: *Deleted
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
## 2022-08-31 NOTE — Congregational Nurse Program (Unsigned)
Today, I had a conversation with Mary Blevins's granddaughter. In addition to living in unsafe conditions and refusing to leave, she expressed Mary concerns about Mary Blevins's declining health, referring to Mary as "[name]." She mentioned that Mary Blevins's behavior has become increasingly unusual. For instance, she recalled an incident where Mary Blevins came out of the house wearing just one shoe, a coat, a bra, and no blouse or pants. Additionally, she mentioned that Mary Blevins was resistant when asked to take a shower. The family is keen on ensuring that Mary Blevins receives the best possible care. However, they are unsure what to do at this point.   Salem Senate RN MSN DNP Assistant Coordinator Congregational Nursing Program [place] Cell [phone]

## 2022-08-31 NOTE — Congregational Nurse Program (Unsigned)
  Dept: [phone]   Congregational Nurse Program Note  Date of Encounter: 08/31/2022  Past Medical History: Past Medical History:  Diagnosis Date   Arthritis    COPD (chronic obstructive pulmonary disease) (HCC)    Dyspnea    Elevated lipids    Generalized headaches    History of pneumonia    Hyperlipidemia    Hypertension    Hypothyroidism    MVP (mitral valve prolapse)    palpitations   Osteopenia    Seasonal allergies    Sinusitis    Tubular adenoma of colon     Encounter Details:  CNP Questionnaire - 08/31/22 1412       Questionnaire   Ask client: Do you give verbal consent for me to treat you today? Yes    Location Patient Served  Mary Blevins    Visit Setting with Client Phone/Text/Email    Patient Status Unknown   Pt is living in poor housing condition   Insurance Medicare    Insurance/Financial Assistance Referral N/A    Medication N/A    Medical Provider Yes    Screening Referrals Made Made Appointment for Client    Medical Referrals Made N/A    Medical Appointment Made N/A    Recently w/o PCP, now 1st time PCP visit completed due to CNs referral or appointment made N/A    Food Have Food Insecurities    Transportation N/A    Housing/Utilities No permanent housing    Interpersonal Safety Do not feel safe at current residence    Interventions Advocate/Support;Educate    Abnormal to Normal Screening Since Last CN Visit N/A    Screenings CN Performed N/A    Did client attend any of the following based off CNs referral or appointments made? N/A    ED Visit Averted N/A    Life-Saving Intervention Made N/A

## 2022-08-31 NOTE — Congregational Nurse Program (Unsigned)
In early Spring of this year, Mary Blevins family reached out to me about her situation. She was living in a "condemned home," experiencing memory issues, and neglecting to take her daily medication. Additionally, she was not maintaining her personal hygiene. On September 2nd, she was admitted to a short-term rehabilitation center, and her family has reported that she is adapting well to the new environment. They are also exploring long-term care options for her.

## 2022-09-01 ENCOUNTER — Ambulatory Visit (INDEPENDENT_AMBULATORY_CARE_PROVIDER_SITE_OTHER): Payer: Medicare HMO | Admitting: Family Medicine

## 2022-09-01 VITALS — BP 126/72 | HR 80 | Temp 98.1°F | Resp 16

## 2022-09-01 DIAGNOSIS — F03B3 Unspecified dementia, moderate, with mood disturbance: Secondary | ICD-10-CM | POA: Diagnosis not present

## 2022-09-01 NOTE — Progress Notes (Signed)
Subjective:    Patient ID: Mary Blevins, female    DOB: [birth year], 77 y.o.   MRN: [id number] Patient presents today with her daughter.  Daughter is concerned about memory loss.  Patient lives alone.  Daughter states that her home was in disarray.  The patient has not been paying some of her pills.  She is becoming more more forgetful.  Daughter is having to repeat answers to questions.  The patient is forgetting conversations.  Patient is forgetting to get dressed.  She is asking questions about events that have already happened.  For instance she was asking to go to a funeral that happened 3 weeks ago.  She had attended the funeral.  She had forgotten that the funeral had already occurred.  I performed a Mini-Mental status exam today.  Patient is unable to tell me the month, the day, or the year.  She is oriented to place and to me at the office.  She is able to remember 3 words but can only remember 2 on recall.  She is unable to perform serial sevens.  She is unable to perform spelling world in reverse.  When I asked her to draw the face of a clock and make it safe 345, the patient through a circle with a :-) and wrote 345 at the bottom.  She seems very withdrawn today.  Neurologic exam cranial nerves II through XII are grossly intact with muscle strength 5/5 equal and symmetric in the upper and lower extremities Past Medical History:  Diagnosis Date   Arthritis    COPD (chronic obstructive pulmonary disease) (HCC)    Dyspnea    Elevated lipids    Generalized headaches    History of pneumonia    Hyperlipidemia    Hypertension    Hypothyroidism    MVP (mitral valve prolapse)    palpitations   Osteopenia    Seasonal allergies    Sinusitis    Tubular adenoma of colon    Past Surgical History:  Procedure Laterality Date   CATARACT EXTRACTION Left    COLONOSCOPY WITH PROPOFOL N/A 01/06/2016   Procedure: COLONOSCOPY WITH PROPOFOL;  Surgeon: Sherrilyn Rist, MD;  Location: [facility];  Service: Gastroenterology;  Laterality: N/A;   DILATION AND CURETTAGE OF UTERUS     THYROIDECTOMY     TONSILLECTOMY     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   Procedure: RIGHT TOTAL HIP ARTHROPLASTY ANTERIOR APPROACH;  Surgeon: Kathryne Hitch, MD;  Location: [facility];  Service: Orthopedics;  Laterality: Right;  Needs RNFA   Current Outpatient Medications on File Prior to Visit  Medication Sig Dispense Refill   albuterol (PROVENTIL) (2.5 MG/3ML) 0.083% nebulizer solution INHALE 3 ML BY NEBULIZATION EVERY 6 HOURS AS NEEDED FOR WHEEZING OR SHORTNESS OF BREATH 300 mL 3   albuterol (VENTOLIN HFA) 108 (90 Base) MCG/ACT inhaler Inhale 2 puffs into the lungs every 6 (six) hours as needed for wheezing or shortness of breath. 8 g 6   budesonide-formoterol (SYMBICORT) 160-4.5 MCG/ACT inhaler Inhale 2 puffs into the lungs 2 (two) times daily. 6 g 0   diclofenac (VOLTAREN) 75 MG EC tablet TAKE 1 TABLET BY MOUTH TWICE A DAY 60 tablet 1   DULoxetine (CYMBALTA) 60 MG capsule      fluticasone (FLONASE) 50 MCG/ACT nasal spray SPRAY 2 SPRAYS INTO EACH NOSTRIL EVERY DAY 16 g 2   gabapentin (NEURONTIN) 300 MG capsule TAKE 1 CAPSULE BY MOUTH EVERY DAY 90 capsule 3  guaiFENesin (MUCINEX) 600 MG 12 hr tablet Take 1 tablet (600 mg total) by mouth 2 (two) times daily. 14 tablet 0   hydrochlorothiazide (HYDRODIURIL) 25 MG tablet Take 1 tablet (25 mg total) by mouth daily. 90 tablet 3   HYDROcodone bit-homatropine (HYCODAN) 5-1.5 MG/5ML syrup Take 5 mLs by mouth every 8 (eight) hours as needed for cough. 120 mL 0   HYDROcodone-acetaminophen (NORCO) 7.5-325 MG tablet Take 1 tablet by mouth every 6 (six) hours as needed for moderate pain. 30 tablet 0   hydrocortisone 2.5 % cream APPLY TO AFFECTED AREA TWICE A DAY 28 g 1   hydrOXYzine (ATARAX) 25 MG tablet TAKE 1 TABLET BY MOUTH 3 TIMES DAILY AS NEEDED FOR ITCHING. 270 tablet 1   levothyroxine (SYNTHROID) 50 MCG tablet TAKE 1 TABLET BY MOUTH EVERY DAY  BEFORE BREAKFAST 90 tablet 3   montelukast (SINGULAIR) 10 MG tablet TAKE 1 TABLET BY MOUTH EVERYDAY AT BEDTIME 90 tablet 0   predniSONE (DELTASONE) 20 MG tablet 3 tabs poqday 1-2, 2 tabs poqday 3-4, 1 tab poqday 5-6 12 tablet 0   sodium chloride (OCEAN) 0.65 % SOLN nasal spray Place 1 spray into both nostrils daily.     Tiotropium Bromide Monohydrate (SPIRIVA RESPIMAT) 2.5 MCG/ACT AERS Inhale 2 puffs into the lungs daily. 4 g 11   tiZANidine (ZANAFLEX) 4 MG tablet TAKE 1 TABLET BY MOUTH EVERY 6 HOURS AS NEEDED FOR MUSCLE SPASMS. 30 tablet 0   verapamil (CALAN-SR) 120 MG CR tablet TAKE 1 TABLET EVERY DAY 90 tablet 3   zolpidem (AMBIEN) 10 MG tablet Take 0.5 tablets (5 mg total) by mouth at bedtime as needed for sleep. 15 tablet 1   No current facility-administered medications on file prior to visit.   Allergies  Allergen Reactions   Oxycodone Other (See Comments)    Hallucinations    Meloxicam Other (See Comments)    BAD DREAMS, SAD THOUGHTS   Social History   Socioeconomic History   Marital status: Widowed    Spouse name: Not on file   Number of children: 0   Years of education: 86   Highest education level: 12th grade  Occupational History    Employer: [employer]  Tobacco Use   Smoking status: Former    Current packs/day: 0.00    Types: Cigarettes    Quit date: 07/24/2005    Years since quitting: 17.1    Passive exposure: Past   Smokeless tobacco: Never  Vaping Use   Vaping status: Never Used  Substance and Sexual Activity   Alcohol use: Yes    Comment: rare   Drug use: No   Sexual activity: Not Currently    Partners: Male  Other Topics Concern   Not on file  Social History Narrative   Lives alone   Social Determinants of Health   Financial Resource Strain: Low Risk  (08/09/2022)   Overall Financial Resource Strain (CARDIA)    Difficulty of Paying Living Expenses: Not hard at all  Food Insecurity: No Food Insecurity (08/09/2022)   Hunger Vital Sign     Worried About Running Out of Food in the Last Year: Never true    Ran Out of Food in the Last Year: Never true  Transportation Needs: No Transportation Needs (08/09/2022)   PRAPARE - Administrator, Civil Service (Medical): No    Lack of Transportation (Non-Medical): No  Physical Activity: Inactive (08/09/2022)   Exercise Vital Sign    Days of Exercise per  Week: 0 days    Minutes of Exercise per Session: 0 min  Stress: No Stress Concern Present (08/09/2022)   [employer] of Occupational Health - Occupational Stress Questionnaire    Feeling of Stress : Only a little  Social Connections: Moderately Integrated (08/09/2022)   Social Connection and Isolation Panel [NHANES]    Frequency of Communication with Friends and Family: More than three times a week    Frequency of Social Gatherings with Friends and Family: More than three times a week    Attends Religious Services: More than 4 times per year    Active Member of [employer] or Organizations: Yes    Attends Banker Meetings: More than 4 times per year    Marital Status: Widowed  Intimate Partner Violence: Not At Risk (08/09/2022)   Humiliation, Afraid, Rape, and Kick questionnaire    Fear of Current or Ex-Partner: No    Emotionally Abused: No    Physically Abused: No    Sexually Abused: No     Review of Systems  Respiratory:  Positive for cough and shortness of breath.   All other systems reviewed and are negative.      Objective:   Physical Exam Vitals reviewed.  Constitutional:      General: She is not in acute distress.    Appearance: She is well-developed and normal weight. She is not ill-appearing.  Cardiovascular:     Rate and Rhythm: Normal rate and regular rhythm.     Pulses: Normal pulses.     Heart sounds: Normal heart sounds. No murmur heard.    No friction rub. No gallop.  Pulmonary:     Effort: Pulmonary effort is normal. No tachypnea, accessory muscle usage, prolonged expiration or  respiratory distress.     Breath sounds: Decreased air movement present. No stridor or transmitted upper airway sounds. Decreased breath sounds present. No wheezing, rhonchi or rales.  Musculoskeletal:     Right lower leg: No edema.     Left lower leg: No edema.  Neurological:     General: No focal deficit present.     Mental Status: She is alert. She is disoriented.     Cranial Nerves: No cranial nerve deficit.     Motor: No weakness.     Coordination: Coordination normal.     Gait: Gait normal.  Psychiatric:        Attention and Perception: Attention normal.        Mood and Affect: Affect is flat.        Speech: Speech is delayed.        Behavior: Behavior is slowed and withdrawn.        Thought Content: Thought content normal.        Cognition and Memory: Cognition is impaired. Memory is impaired. She exhibits impaired recent memory.        Judgment: Judgment normal.           Assessment & Plan:   Moderate dementia with mood disturbance, unspecified dementia type (HCC) I completed an [place] 2 form.  The patient's daughter is looking to have a place in a assisted living facility or nursing home.  I will start Aricept 5 mg daily and uptitrate as tolerated.  Recommended checking a B12 level but our lab is closed today.  Recommended stopping any mind altering substance such as muscle relaxers, sleeping medication, hydroxyzine, or hydrocodone

## 2022-09-06 ENCOUNTER — Telehealth: Payer: Self-pay | Admitting: Family Medicine

## 2022-09-06 NOTE — Telephone Encounter (Signed)
Patient's daughter called to follow up on request for chart notes to be sent to [facility] Nursing home related to the [facility] completed during office visit with Dr. Tanya Nones on Friday, 09/01/2022. Please fax to [phone], attn: Marily Lente Coordinator.

## 2022-09-11 ENCOUNTER — Other Ambulatory Visit: Payer: Self-pay | Admitting: Family Medicine

## 2022-09-11 NOTE — Telephone Encounter (Signed)
Prescription Request  09/11/2022  LOV: 09/01/2022  What is the name of the medication or equipment?   levothyroxine (SYNTHROID) 50 MCG tablet **90 day script requested**   Have you contacted your pharmacy to request a refill? Yes   Which pharmacy would you like this sent to?  [id number] - [place], [place] - [address] AT [facility] [address] [place] [address] Phone: [phone] Fax: [phone]    Patient notified that their request is being sent to the clinical staff for review and that they should receive a response within 2 business days.   Please advise pharmacist.

## 2022-09-12 NOTE — Telephone Encounter (Signed)
Requested Prescriptions  Pending Prescriptions Disp Refills   diclofenac (VOLTAREN) 75 MG EC tablet [Pharmacy Med Name: DICLOFENAC SOD EC 75 MG TAB] 60 tablet 1    Sig: TAKE 1 TABLET BY MOUTH TWICE A DAY     Analgesics:  NSAIDS Failed - 09/11/2022 10:37 AM      Failed - Manual Review: Labs are only required if the patient has taken medication for more than 8 weeks.      Failed - Valid encounter within last 12 months    Recent Outpatient Visits           1 year ago COPD with acute exacerbation (HCC)   [facility] Pickard, Priscille Heidelberg, MD   1 year ago COPD with acute exacerbation (HCC)   [facility] Donita Brooks, MD   1 year ago COPD with acute exacerbation (HCC)   [facility] Donita Brooks, MD   1 year ago Acute pain of left knee   [facility] Donita Brooks, MD   2 years ago Mixed simple and mucopurulent chronic bronchitis (HCC)   [facility] Valentino Nose, NP              Passed - Cr in normal range and within 360 days    Creat  Date Value Ref Range Status  01/26/2022 0.83 0.60 - 1.00 mg/dL Final   Creatinine, Ser  Date Value Ref Range Status  07/31/2022 0.86 0.44 - 1.00 mg/dL Final         Passed - HGB in normal range and within 360 days    Hemoglobin  Date Value Ref Range Status  07/31/2022 12.8 12.0 - 15.0 g/dL Final         Passed - PLT in normal range and within 360 days    Platelets  Date Value Ref Range Status  07/31/2022 311 150 - 400 K/uL Final         Passed - HCT in normal range and within 360 days    HCT  Date Value Ref Range Status  07/31/2022 38.0 36.0 - 46.0 % Final         Passed - eGFR is 30 or above and within 360 days    GFR, Est African American  Date Value Ref Range Status  03/12/2019 92 > OR = 60 mL/min/1.35m2 Final   GFR, Est Non African American  Date Value Ref Range Status  03/12/2019 80 > OR = 60 mL/min/1.26m2 Final   GFR,  Estimated  Date Value Ref Range Status  07/31/2022 >60 >60 mL/min Final    Comment:    (NOTE) Calculated using the CKD-EPI Creatinine Equation (2021)    eGFR  Date Value Ref Range Status  01/26/2022 73 > OR = 60 mL/min/1.6m2 Final         Passed - Patient is not pregnant       montelukast (SINGULAIR) 10 MG tablet [Pharmacy Med Name: MONTELUKAST SOD 10 MG TABLET] 90 tablet 0    Sig: TAKE 1 TABLET BY MOUTH EVERYDAY AT BEDTIME     Pulmonology:  Leukotriene Inhibitors Failed - 09/11/2022 10:37 AM      Failed - Valid encounter within last 12 months    Recent Outpatient Visits           1 year ago COPD with acute exacerbation (HCC)   [facility] Pickard, Priscille Heidelberg, MD   1 year ago COPD with  acute exacerbation (HCC)   [facility] Pickard, Priscille Heidelberg, MD   1 year ago COPD with acute exacerbation [facility])   [facility] Pickard, Priscille Heidelberg, MD   1 year ago Acute pain of left knee   [facility] Donita Brooks, MD   2 years ago Mixed simple and mucopurulent chronic bronchitis (HCC)   [facility] Valentino Nose, NP

## 2022-09-13 NOTE — Telephone Encounter (Signed)
Pharmacy requesting refill of levothyroxine (SYNTHROID) 50 MCG tablet  **90 day script requested  LOV 09/01/2022.  Ph[facility]acy:  [id number] - [place], [place] - [address] AT [facility] [address], [place] [address] Phone: [phone]  Fax: [phone] DEA #: [id number]   Please advise pharmacist.

## 2022-09-13 NOTE — Telephone Encounter (Signed)
Requested medication (s) are due for refill today: routing for lab review  Requested medication (s) are on the active medication list: yes  Last refill:  12/12/21  Future visit scheduled: no  Notes to clinic:  Unable to refill per protocol due to failed labs, no updated TSH results.      Requested Prescriptions  Pending Prescriptions Disp Refills   levothyroxine (SYNTHROID) 50 MCG tablet 90 tablet 3    Sig: TAKE 1 TABLET BY MOUTH EVERY DAY BEFORE BREAKFAST     Endocrinology:  Hypothyroid Agents Failed - 09/11/2022  1:48 PM      Failed - TSH in normal range and within 360 days    TSH  Date Value Ref Range Status  08/04/2020 2.65 0.40 - 4.50 mIU/L Final         Failed - Valid encounter within last 12 months    Recent Outpatient Visits           1 year ago COPD with acute exacerbation (HCC)   [facility] Pickard, Priscille Heidelberg, MD   1 year ago COPD with acute exacerbation (HCC)   [facility] Donita Brooks, MD   1 year ago COPD with acute exacerbation [facility])   [facility] Donita Brooks, MD   1 year ago Acute pain of left knee   [facility] Donita Brooks, MD   2 years ago Mixed simple and mucopurulent chronic bronchitis (HCC)   [facility] Valentino Nose, NP

## 2022-09-15 ENCOUNTER — Other Ambulatory Visit: Payer: Self-pay

## 2022-09-15 ENCOUNTER — Encounter (HOSPITAL_COMMUNITY): Payer: Self-pay | Admitting: *Deleted

## 2022-09-15 ENCOUNTER — Emergency Department (HOSPITAL_COMMUNITY)
Admission: EM | Admit: 2022-09-15 | Discharge: 2022-09-26 | Disposition: A | Discharge status: Skilled Nursing Facility | Payer: Medicare HMO | Attending: Emergency Medicine | Admitting: Emergency Medicine

## 2022-09-15 DIAGNOSIS — J449 Chronic obstructive pulmonary disease, unspecified: Secondary | ICD-10-CM | POA: Insufficient documentation

## 2022-09-15 DIAGNOSIS — I10 Essential (primary) hypertension: Secondary | ICD-10-CM | POA: Diagnosis not present

## 2022-09-15 DIAGNOSIS — F03918 Unspecified dementia, unspecified severity, with other behavioral disturbance: Secondary | ICD-10-CM | POA: Insufficient documentation

## 2022-09-15 DIAGNOSIS — F0390 Unspecified dementia without behavioral disturbance: Secondary | ICD-10-CM | POA: Diagnosis not present

## 2022-09-15 DIAGNOSIS — F32A Depression, unspecified: Secondary | ICD-10-CM | POA: Diagnosis not present

## 2022-09-15 DIAGNOSIS — E039 Hypothyroidism, unspecified: Secondary | ICD-10-CM | POA: Insufficient documentation

## 2022-09-15 DIAGNOSIS — R4182 Altered mental status, unspecified: Secondary | ICD-10-CM | POA: Diagnosis not present

## 2022-09-15 LAB — CBC
HCT: 47.5 % — ABNORMAL HIGH (ref 36.0–46.0)
Hemoglobin: 15.8 g/dL — ABNORMAL HIGH (ref 12.0–15.0)
MCH: 29.1 pg (ref 26.0–34.0)
MCHC: 33.3 g/dL (ref 30.0–36.0)
MCV: 87.5 fL (ref 80.0–100.0)
Platelets: 359 10*3/uL (ref 150–400)
RBC: 5.43 MIL/uL — ABNORMAL HIGH (ref 3.87–5.11)
RDW: 14.4 % (ref 11.5–15.5)
WBC: 10 10*3/uL (ref 4.0–10.5)
nRBC: 0 % (ref 0.0–0.2)

## 2022-09-15 LAB — COMPREHENSIVE METABOLIC PANEL
ALT: 17 U/L (ref 0–44)
AST: 23 U/L (ref 15–41)
Albumin: 3.3 g/dL — ABNORMAL LOW (ref 3.5–5.0)
Alkaline Phosphatase: 48 U/L (ref 38–126)
Anion gap: 11 (ref 5–15)
BUN: 14 mg/dL (ref 8–23)
CO2: 27 mmol/L (ref 22–32)
Calcium: 8.7 mg/dL — ABNORMAL LOW (ref 8.9–10.3)
Chloride: 103 mmol/L (ref 98–111)
Creatinine, Ser: 0.86 mg/dL (ref 0.44–1.00)
GFR, Estimated: >60 mL/min (ref >60)
Glucose, Bld: 114 mg/dL — ABNORMAL HIGH (ref 70–99)
Potassium: 3.1 mmol/L — ABNORMAL LOW (ref 3.5–5.1)
Sodium: 141 mmol/L (ref 135–145)
Total Bilirubin: 0.8 mg/dL (ref 0.3–1.2)
Total Protein: 6.5 g/dL (ref 6.5–8.1)

## 2022-09-15 LAB — URINALYSIS, ROUTINE W REFLEX MICROSCOPIC
Bilirubin Urine: NEGATIVE
Glucose, UA: NEGATIVE mg/dL
Hgb urine dipstick: NEGATIVE
Ketones, ur: NEGATIVE mg/dL
Nitrite: NEGATIVE
Protein, ur: 30 mg/dL — AB
Specific Gravity, Urine: 1.019 (ref 1.005–1.030)
pH: 5 (ref 5.0–8.0)

## 2022-09-15 LAB — RAPID URINE DRUG SCREEN, HOSP PERFORMED
Amphetamines: NOT DETECTED
Barbiturates: NOT DETECTED
Benzodiazepines: NOT DETECTED
Cocaine: NOT DETECTED
Opiates: NOT DETECTED
Tetrahydrocannabinol: NOT DETECTED

## 2022-09-15 LAB — T4, FREE: Free T4: 1.1 ng/dL (ref 0.61–1.12)

## 2022-09-15 LAB — TSH: TSH: 5.122 u[IU]/mL — ABNORMAL HIGH (ref 0.350–4.500)

## 2022-09-15 MED ORDER — SODIUM CHLORIDE 0.9 % IV BOLUS
500.0000 mL | Freq: Once | INTRAVENOUS | Status: AC
  Administered 2022-09-15: 500 mL via INTRAVENOUS

## 2022-09-15 MED ORDER — LEVOTHYROXINE SODIUM 50 MCG PO TABS
50.0000 ug | ORAL_TABLET | Freq: Every day | ORAL | Status: DC
  Administered 2022-09-16 – 2022-09-26 (×11): 50 ug via ORAL
  Filled 2022-09-15 (×11): qty 1

## 2022-09-15 MED ORDER — DONEPEZIL HCL 5 MG PO TABS
5.0000 mg | ORAL_TABLET | Freq: Every day | ORAL | Status: DC
  Administered 2022-09-15 – 2022-09-25 (×11): 5 mg via ORAL
  Filled 2022-09-15 (×11): qty 1

## 2022-09-15 MED ORDER — POTASSIUM CHLORIDE CRYS ER 20 MEQ PO TBCR
20.0000 meq | EXTENDED_RELEASE_TABLET | Freq: Once | ORAL | Status: AC
  Administered 2022-09-15: 20 meq via ORAL
  Filled 2022-09-15: qty 1

## 2022-09-15 MED ORDER — ALBUTEROL SULFATE HFA 108 (90 BASE) MCG/ACT IN AERS: 2.0000 | INHALATION_SPRAY | Freq: Four times a day (QID) | RESPIRATORY_TRACT | Status: DC | PRN

## 2022-09-15 NOTE — ED Notes (Signed)
Member from church called and patient states it is okay for me to talk to church member due to she is a Engineer, civil (consulting) and helps care for her. Church member stated that patient is living in unsafe living condition due to house being old and patient has AMS. Church member said that APS has been out and did nothing.  Explained to church member that I will relay this to medical staff.

## 2022-09-15 NOTE — ED Notes (Signed)
Pt provided with frozen meal by NT. Pt remains calm and cooperative

## 2022-09-15 NOTE — ED Provider Notes (Addendum)
[place] EMERGENCY DEPARTMENT AT [facility] Provider Note   CSN: [id number] Arrival date & time: 09/15/22  1309     History  Chief Complaint  Patient presents with   Failure To Thrive    Mary Blevins is a 77 y.o. female.  HPI Patient brought in by family member.  Reportedly has had dementia and worsening depression.  Has been eating less.  Reportedly is that she is not suicidal but reportedly sort of shrugs when she says it.  Decreased appetite.  Per the family ember patient has been lighting candles around the house and forgetting about them.  They have been attempting to get her into a nursing home, however reportedly cannot get it paid for.   Past Medical History:  Diagnosis Date   Arthritis    COPD (chronic obstructive pulmonary disease) (HCC)    Dyspnea    Elevated lipids    Generalized headaches    History of pneumonia    Hyperlipidemia    Hypertension    Hypothyroidism    MVP (mitral valve prolapse)    palpitations   Osteopenia    Seasonal allergies    Sinusitis    Tubular adenoma of colon     Home Medications Prior to Admission medications   Medication Sig Start Date End Date Taking? Authorizing Provider  albuterol (PROVENTIL) (2.5 MG/3ML) 0.083% nebulizer solution INHALE 3 ML BY NEBULIZATION EVERY 6 HOURS AS NEEDED FOR WHEEZING OR SHORTNESS OF BREATH 07/07/22   Donita Brooks, MD  albuterol (VENTOLIN HFA) 108 (90 Base) MCG/ACT inhaler Inhale 2 puffs into the lungs every 6 (six) hours as needed for wheezing or shortness of breath. 01/27/22   Coralyn Helling, MD  budesonide-formoterol (SYMBICORT) 160-4.5 MCG/ACT inhaler Inhale 2 puffs into the lungs 2 (two) times daily. 10/10/21   Coralyn Helling, MD  diclofenac (VOLTAREN) 75 MG EC tablet TAKE 1 TABLET BY MOUTH TWICE A DAY 09/12/22   Donita Brooks, MD  DULoxetine (CYMBALTA) 60 MG capsule  11/09/20   [provider]  fluticasone (FLONASE) 50 MCG/ACT nasal spray SPRAY 2 SPRAYS INTO EACH  NOSTRIL EVERY DAY 07/11/18   Donita Brooks, MD  gabapentin (NEURONTIN) 300 MG capsule TAKE 1 CAPSULE BY MOUTH EVERY DAY 06/23/22   Donita Brooks, MD  guaiFENesin (MUCINEX) 600 MG 12 hr tablet Take 1 tablet (600 mg total) by mouth 2 (two) times daily. 10/29/21   Small, Brooke L, PA  hydrochlorothiazide (HYDRODIURIL) 25 MG tablet Take 1 tablet (25 mg total) by mouth daily. 03/03/22   Donita Brooks, MD  HYDROcodone-acetaminophen (NORCO) 7.5-325 MG tablet Take 1 tablet by mouth every 6 (six) hours as needed for moderate pain. 08/04/22   Donita Brooks, MD  hydrocortisone 2.5 % cream APPLY TO AFFECTED AREA TWICE A DAY 07/07/22   Donita Brooks, MD  levothyroxine (SYNTHROID) 50 MCG tablet TAKE 1 TABLET BY MOUTH EVERY DAY BEFORE BREAKFAST 12/12/21   Donita Brooks, MD  montelukast (SINGULAIR) 10 MG tablet TAKE 1 TABLET BY MOUTH EVERYDAY AT BEDTIME 09/12/22   Donita Brooks, MD  sodium chloride (OCEAN) 0.65 % SOLN nasal spray Place 1 spray into both nostrils daily.    [provider]  Tiotropium Bromide Monohydrate (SPIRIVA RESPIMAT) 2.5 MCG/ACT AERS Inhale 2 puffs into the lungs daily. 01/04/22   Coralyn Helling, MD  verapamil (CALAN-SR) 120 MG CR tablet TAKE 1 TABLET EVERY DAY 01/19/22   Donita Brooks, MD      Allergies  Oxycodone and Meloxicam    Review of Systems   Review of Systems  Physical Exam Updated Vital Signs BP 128/88   Pulse 75   Temp 98.2 F (36.8 C)   Resp 18   Ht 5' 5.5" (1.664 m)   Wt 63 kg   SpO2 99%   BMI 22.78 kg/m  Physical Exam Vitals and nursing note reviewed.  HENT:     Head: Normocephalic.  Eyes:     Pupils: Pupils are equal, round, and reactive to light.  Cardiovascular:     Rate and Rhythm: Regular rhythm.  Pulmonary:     Effort: Pulmonary effort is normal.  Abdominal:     Tenderness: There is no abdominal tenderness.  Neurological:     Mental Status: She is alert. Mental status is at baseline.  Psychiatric:     Comments:  Somewhat flat affect.     ED Results / Procedures / Treatments   Labs (all labs ordered are listed, but only abnormal results are displayed) Labs Reviewed  COMPREHENSIVE METABOLIC PANEL - Abnormal; Notable for the following components:      Result Value   Potassium 3.1 (*)    Glucose, Bld 114 (*)    Calcium 8.7 (*)    Albumin 3.3 (*)    All other components within normal limits  TSH - Abnormal; Notable for the following components:   TSH 5.122 (*)    All other components within normal limits  CBC - Abnormal; Notable for the following components:   RBC 5.43 (*)    Hemoglobin 15.8 (*)    HCT 47.5 (*)    All other components within normal limits  URINALYSIS, ROUTINE W REFLEX MICROSCOPIC - Abnormal; Notable for the following components:   APPearance HAZY (*)    Protein, ur 30 (*)    Leukocytes,Ua SMALL (*)    Bacteria, UA RARE (*)    All other components within normal limits  URINE CULTURE  RAPID URINE DRUG SCREEN, HOSP PERFORMED  T4, FREE  T3, FREE    EKG None  Radiology No results found.  Procedures Procedures    Medications Ordered in ED Medications  potassium chloride SA (KLOR-CON M) CR tablet 20 mEq (has no administration in time range)  sodium chloride 0.9 % bolus 500 mL (0 mLs Intravenous Stopped 09/15/22 1655)    ED Course/ Medical Decision Making/ A&P                                 Medical Decision Making Amount and/or Complexity of Data Reviewed Labs: ordered.  Risk Prescription drug management.   Patient with reported decreased energy.  Decreased oral intake.  History of both dementia and reportedly depression.  Family's been attempted to get her placed to a higher level of care but have not been able to do it.  Also having some risky things at home such as lighting candles and forgetting about them.  Will check basic blood work to check for things such as dehydration.  Will check EKG to evaluate QTc.  If medically cleared likely will have  patient seen by psychiatry since patient has been having the depression.  Patient is now medically cleared.  Has mild hypokalemia.  Hemoglobin is increased likely from dehydration.  Urine had potential infection but not having any dysuria.  TSH is elevated.  Per patient's family ember has been taking extra thyroid medicines even with this.  Do not  think we require admission to the hospital at this time.  Although another member of the patient's nurse called and said that it is unsafe with her being at home due to the condition of the house being old and that she has been confused.  Will have patient seen by TTS if not may need TOC.  It appears that there has previously been Adult Protective Services notified through PCP.  Inpatient Geri psych recommended by psychiatry.  Also requested TOC to evaluate for advanced directives.          Final Clinical Impression(s) / ED Diagnoses Final diagnoses:  Dementia, unspecified dementia severity, unspecified dementia type, unspecified whether behavioral, psychotic, or mood disturbance or anxiety (HCC)  Depression, unspecified depression type    Rx / DC Orders ED Discharge Orders     None         Benjiman Core, MD 09/15/22 1950    Benjiman Core, MD 09/15/22 2147

## 2022-09-15 NOTE — BH Assessment (Addendum)
Comprehensive Clinical Assessment (CCA) Note  09/15/2022 Mary Blevins [id number] Disposition: Clinician discussed patient care with Mary Bering, NP.  She recommends inpatient geropsych placement.  Clinician informed Dr. Rubin Blevins of recommended disposition via secure messaging.    Pt has poor eye contact and lets daughter answer questions.  Pt is not oriented to time.  She is not responding to internal stimuli during assessment but daughter said she will talk to people not there.  Pt downplays her living situation and has poor judgement.  She has not been attending to self care.  Pt has not been eating well and has been getting little sleep.  Patient has no current outpatient care.     Chief Complaint:  Chief Complaint  Patient presents with   Failure To Thrive   Visit Diagnosis: MDD recurrent, severe    CCA Screening, Triage and Referral (STR)  Patient Reported Information How did you hear about [place]? Family/Friend (Daughter brought patient to the hospital.)  What Is the Reason for Your Visit/Call Today? Pt came to hospital due to depression over the last 6 months.  Pt denies any SI.  No previous suicide attempt.  Patient denies any HI or A/V hallucinations.  Daughter said that her mother has been seeing things not there.  She said that patient has been not eating in the last 24 hours.  Pt will light candles and leave off the air conditioner and lights.  Patient will sit in the dark and not bathe herself or attend to ADLs.  Pt has not been taking medications as directed.  Patient will call relatives and cry (may call as early as 03:00).  Pt had a hip replacement (right) in 2022.  She will use a cane to get around.  Pt house is a hoarder house per daughter and there is limited ability to maneuver.  Pt may drinks a beer a few times in a week.  Pt may have two 9mm guns in the home.  She does not know where they are at however.  Pt says she gets to her doctor appointments.  Patient has  lost some weight but cannot say how much wight she has lost.  Pt says she feels like eating but daughter said she is not doing so regularly.  Patient daughter said that an FL2 was completed but patient's income is such that she does not meet the minimum qualifications for long term Medicaid.  Daughter has paperwork for POA completed but it is not yet notarized.  Daughter has filed a case with APS to try to get her into assisted living.  How Long Has This Been Causing You Problems? 1-6 months  What Do You Feel Would Help You the Most Today? Housing Assistance; Treatment for Depression or other mood problem; Medication(s)   Have You Recently Had Any Thoughts About Hurting Yourself? No  Are You Planning to Commit Suicide/Harm Yourself At This time? No   Flowsheet Row ED from 09/15/2022 in [facility] Department at [facility] ED from 07/31/2022 in [facility] Emergency Department at [facility] ED from 02/20/2022 in [facility] Emergency Department at [facility]  C-SSRS RISK CATEGORY No Risk No Risk No Risk       Have you Recently Had Thoughts About Hurting Someone Mary Blevins? No  Are You Planning to Harm Someone at This Time? No  Explanation: Pt denies any SI or HI.   Have You Used Any Alcohol or Drugs in the Past 24 Hours? No  What Did You Use and How Much? NOne   Do You Currently Have a Therapist/Psychiatrist? No  Name of Therapist/Psychiatrist: Name of Therapist/Psychiatrist: None   Have You Been Recently Discharged From Any Office Practice or Programs? No  Explanation of Discharge From Practice/Program: Pt has had no recent discharges     CCA Screening Triage Referral Assessment Type of Contact: Tele-Assessment  Telemedicine Service Delivery:   Is this Initial or Reassessment? Is this Initial or Reassessment?: Initial Assessment  Date Telepsych consult ordered in [facility]:  Date Telepsych consult ordered in [facility]: 09/15/22  Time Telepsych consult  ordered in [facility]:  Time Telepsych consult ordered in [facility]: 1930  Location of Assessment: [facility] ED  Provider Location: [facility] Assessment Services   Collateral Involvement: daughter Mary Blevins [phone]   Does Patient Have a Court Appointed Legal Guardian? No  Legal Guardian Contact Information: Pt has no legal guardian  Copy of Legal Guardianship Form: -- (Pt has no legal guardian)  Legal Guardian Notified of Arrival: -- (Pt has no legal guardian)  Legal Guardian Notified of Pending Discharge: -- (Pt has no legal guardian)  If Minor and Not Living with Parent(s), Who has Custody? Pt is an adult  Is CPS involved or ever been involved? Never  Is APS involved or ever been involved? Currently   Patient Determined To Be At Risk for Harm To Self or Others Based on Review of Patient Reported Information or Presenting Complaint? No  Method: No Plan  Availability of Means: No access or NA  Intent: Vague intent or NA  Notification Required: No need or identified person  Additional Information for Danger to Others Potential: -- (No danger to others.)  Additional Comments for Danger to Others Potential: No danger to others.  Are There Guns or Other Weapons in Your Home? Yes  Types of Guns/Weapons: Daughter said there are two 9mm handguns in the home.  But pt does not know where they are.  Are These Weapons Safely Secured?                            No  Who Could Verify You Are Able To Have These Secured: Daugher  Do You Have any Outstanding Charges, Pending Court Dates, Parole/Probation? None  Contacted To Inform of Risk of Harm To Self or Others: Other: Comment (No need to contact.)    Does Patient Present under Involuntary Commitment? No    [place] of Residence: [place]   Patient Currently Receiving the Following Services: Not Receiving Services   Determination of Need: Urgent (48 hours)   Options For Referral: Geropsychiatric Facility     CCA  Biopsychosocial Patient Reported Schizophrenia/Schizoaffective Diagnosis in Past: No   Strengths: Pt cannot identify any strengths.   Mental Health Symptoms Depression:   Change in energy/activity; Hopelessness; Worthlessness; Increase/decrease in appetite; Fatigue; Difficulty Concentrating; Sleep (too much or little)   Duration of Depressive symptoms:  Duration of Depressive Symptoms: Greater than two weeks   Mania:   None   Anxiety:    Difficulty concentrating   Psychosis:   None   Duration of Psychotic symptoms:    Trauma:   Difficulty staying/falling asleep   Obsessions:   N/A   Compulsions:   None   Inattention:   None   Hyperactivity/Impulsivity:   None   Oppositional/Defiant Behaviors:   None   Emotional Irregularity:   Chronic feelings of emptiness   Other Mood/Personality Symptoms:   Dementia  Mental Status Exam Appearance and self-care  Stature:   Average   Weight:   Thin   Clothing:   Casual   Grooming:   Neglected   Cosmetic use:   None   Posture/gait:   Stooped   Motor activity:   Slowed   Sensorium  Attention:   Inattentive   Concentration:   Anxiety interferes   Orientation:   Situation; Place; Person   Recall/memory:   Defective in Recent   Affect and Mood  Affect:   Congruent; Depressed; Flat   Mood:   Depressed   Relating  Eye contact:   Fleeting   Facial expression:   Depressed; Sad   Attitude toward examiner:   Cooperative   Thought and Language  Speech flow:  Clear and Coherent   Thought content:   Appropriate to Mood and Circumstances   Preoccupation:   Ruminations   Hallucinations:   Auditory (Pt will talk to people not there.)   Organization:   Goal-directed   [employer] of Knowledge:   Average   Intelligence:   Average   Abstraction:   Functional   Judgement:   Poor   Reality Testing:   Distorted   Insight:   Denial; Poor   Decision Making:    Paralyzed   Social Functioning  Social Maturity:   Isolates   Social Judgement:   Naive   Stress  Stressors:   Housing; Illness; Financial; Transitions   Coping Ability:   Overwhelmed   Skill Deficits:   Decision making; Self-care; Activities of daily living; Communication   Supports:   Family     Religion: Religion/Spirituality Are You A Religious Person?: Yes What is Your Religious Affiliation?: Christian How Might This Affect Treatment?: No affect on treatment  Leisure/Recreation: Leisure / Recreation Do You Have Hobbies?: No  Exercise/Diet: Exercise/Diet Do You Exercise?: No Have You Gained or Lost A Significant Amount of Weight in the Past Six Months?: Yes-Lost Number of Pounds Lost?:  (unsure but most weight was 136 lbs) Do You Follow a Special Diet?: No Do You Have Any Trouble Sleeping?: Yes Explanation of Sleeping Difficulties: Pt states she has a hard time sleeping.   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  1 adopted daughter.) How is patient's relationship with their children?: Good  Childhood History:  Childhood History By whom was/is the patient raised?: Both parents Did patient suffer any verbal/emotional/physical/sexual abuse as a child?: No Did patient suffer from  severe childhood neglect?: No Has patient ever been sexually abused/assaulted/raped as an adolescent or adult?: No Was the patient ever a victim of a crime or a disaster?: No Witnessed domestic violence?: No Has patient been affected by domestic violence as an adult?: No       CCA Substance Use Alcohol/Drug Use: Alcohol / Drug Use Pain Medications: See PTA medication list Prescriptions: See PTA medication list Over the Counter: See PTA medication  list History of alcohol / drug use?: No history of alcohol / drug abuse                         ASAM's:  Six Dimensions of Multidimensional Assessment  Dimension 1:  Acute Intoxication and/or Withdrawal Potential:      Dimension 2:  Biomedical Conditions and Complications:      Dimension 3:  Emotional, Behavioral, or Cognitive Conditions and Complications:     Dimension 4:  Readiness to Change:     Dimension 5:  Relapse, Continued use, or Continued Problem Potential:     Dimension 6:  Recovery/Living Environment:     ASAM Severity Score:    ASAM Recommended Level of Treatment:     Substance use Disorder (SUD)    Recommendations for Services/Supports/Treatments:    Discharge Disposition:    DSM5 Diagnoses: Patient Active Problem List   Diagnosis Date Noted   COPD with acute exacerbation (HCC) 02/25/2021   Decreased urine output 09/26/2020    Class: Acute   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   Unilateral primary osteoarthritis, right hip 09/23/2020   Leg cramping 01/07/2020   GAD (generalized anxiety disorder) 12/20/2016   Chronic insomnia 12/20/2016   Aortic atherosclerosis (HCC) 06/09/2016   Benign neoplasm of ascending colon    Benign neoplasm of transverse colon    Benign neoplasm of sigmoid colon    Diverticulosis of sigmoid colon    Grade I internal hemorrhoids    PSVT (paroxysmal supraventricular tachycardia) 02/17/2015   Glaucoma suspect of both eyes 05/30/2013   Nuclear  sclerosis 05/30/2013   Back pain 07/22/2012   Acute pain of left hip 07/22/2012   Stricture and stenosis of esophagus 09/18/2011   Hx of colonic polyps 09/18/2011   COPD (chronic obstructive pulmonary disease) (HCC) 07/14/2010   Generalized headaches 07/14/2010   Osteopenia 07/14/2010   Hypothyroid 07/14/2010   Elevated lipids 07/14/2010     Referrals to Alternative Service(s): Referred to Alternative Service(s):   Place:   Date:   Time:    Referred to Alternative Service(s):   Place:   Date:   Time:    Referred to Alternative Service(s):   Place:   Date:   Time:    Referred to Alternative Service(s):   Place:   Date:   Time:     Wandra Mannan

## 2022-09-15 NOTE — ED Notes (Signed)
Walk into room to check and patient and patient is tearful when asked what is wrong she states "I'm just depressed." Asked patient what she is depressed about but patient just kept saying I'm just depressed

## 2022-09-15 NOTE — ED Notes (Signed)
Family at bedside taking home pts personal belongings including 1 ring and 5 bracelets. Pts personal cane remains at bedside

## 2022-09-15 NOTE — ED Triage Notes (Addendum)
Pt's daughter concerned that pt may not be eating enough, daughter states she has been diagnosed dementia recently and depression. Pt admits to being depressed but denies any SI or HI. Pt admits to decrease in appetite.  Pt tearful in triage.

## 2022-09-15 NOTE — Congregational Nurse Program (Unsigned)
Mary Blevins granddaughter contacted [place] today, expressing concern about her grandmother's increasing confusion. This morning, Mary Blevins was found burning candles in her very old home, which the family suspects may have a mole infestation. Despite their best efforts over the last few weeks, the family has been unable to persuade her to seek assistance. Mary Blevins is refusing to relocate, and her family is deeply worried about her well-being.  Salem Senate RN MSN DNP  Congregational Nursing Program [facility] [phone]

## 2022-09-15 NOTE — ED Notes (Signed)
TTS set up at bedside. Pts nieces at bedside.

## 2022-09-15 NOTE — ED Notes (Signed)
Home medications reviewed with assistance from pt's niece Steward Drone at bedside. Had recent medication changed earleir this month from PCP d/t concerns for increasing confusion. Several medications were discontinued. These changes were recorded using nieces medication list at bedside. Pt is unable to say when she last took some medications. Niece sts concern she may have been taking her medications incorrectly.

## 2022-09-16 LAB — URINE CULTURE: Culture: NO GROWTH

## 2022-09-16 LAB — POTASSIUM: Potassium: 3.1 mmol/L — ABNORMAL LOW (ref 3.5–5.1)

## 2022-09-16 LAB — T3, FREE: T3, Free: 4.1 pg/mL (ref 2.0–4.4)

## 2022-09-16 NOTE — Progress Notes (Signed)
Pt meets inpatient behavioral health per [facility] Bobbitt,NP. CSW requested [facility] [name],RN.  Maryjean Ka, MSW, [facility] 09/16/2022 12:34 AM

## 2022-09-16 NOTE — Progress Notes (Addendum)
Per Day [facility] Antoinette Cillo, RN  pt is still hypokalemic. Therefore pt is unable to transfer for inpatient [facility] placement within the [facility] system at this time.  Care Tea, notified: Iona Coach, RN, Wynonia Lawman, [place] Citizen, Night [facility] Herold Harms, Esbeydw Francesco Runner, MSW, [facility] 09/16/2022 6:17 PM

## 2022-09-16 NOTE — Progress Notes (Signed)
LCSW Progress Note  [id number]   Mary Blevins  09/16/2022  9:10 AM    Inpatient Behavioral Health Placement  Pt meets inpatient criteria per [name],NP. There are no available beds within [facility] per [facility] [name],RN. Referral was sent to the following facilities;   Destination  Service Provider Address Phone Fax  [facility]  [address] [place], [place] [address] [phone]  [facility]  [address] [place] [address] [phone]  [facility] Patient Placement  [facility], [place] [phone]  [facility]  [address] [place], [place] [address] [phone]  [facility]  [address] [place] [address] [phone]  [facility]  [address] [place] [address] [phone]  [facility]  [address], [place] [address] [phone]  [facility]  [address] [place] [address] [phone]  [facility]  [place] [phone]  [facility]  [address] [place], [place] [phone]  [facility]  [address] [place] [address] [phone]  [facility]  [address], [place] [address] [phone]  [facility]  [address], [place] [address] [phone]  [facility]  [address], [place] [address] [phone]  [facility]  [address] [place], [place] [address] [phone]   [facility]  [address] [place] [address] [phone]  [facility]patient [facility]  [place] [phone]  [facility]  [address] [place] [address] [phone]  [facility]  [address] [place] [address] [phone]  [facility]  [facility] [address] [place] [address] [phone]  [facility]  [address] [place] [address] [phone]  [facility] [address]  [address], [place] [address] [phone]  [facility]  [address] [place] [address] [phone]    Situation ongoing,  CSW will follow up.    Maryjean Ka, MSW, LCSWA 09/16/2022 9:10 AM

## 2022-09-16 NOTE — ED Notes (Signed)
Pt has been ambulating in hallway throughout the day to the bathroom. Pt uses cane to walk and is a standby assist to ambulate, pt also has fed self today and has had bed linens changed when pt woke up this morning.

## 2022-09-16 NOTE — Progress Notes (Addendum)
Per Per Iona Coach, RN potassium was ordered at of 9:21am.   Per [facility] Antoinette Cillo, RN a new potassium level is needed. Nursing Wynonia Lawman added to communication. CSW/ Disposition will assist and follow.   Maryjean Ka, MSW, [facility] 09/16/2022 9:17 AM

## 2022-09-16 NOTE — ED Notes (Signed)
Pt did not eat breakfast: stated the bacon is underdone and she didn't want the eggs. Offered to try to find something different but pt refused but was still very pleasant about it, kept juice and fruit for later.

## 2022-09-16 NOTE — ED Provider Notes (Signed)
Emergency Medicine Observation Re-evaluation Note  Mary Blevins is a 77 y.o. female, seen on rounds today.  Pt initially presented to the ED for complaints of Failure To Thrive Currently, the patient is awaiting behavioral health placement.  Physical Exam  BP (!) 132/92   Pulse (!) 102   Temp 97.8 F (36.6 C) (Oral)   Resp (!) 22   Ht 5' 5.5" (1.664 m)   Wt 63 kg   SpO2 98%   BMI 22.78 kg/m  Physical Exam Alert and in no acute distress  ED Course / MDM  EKG:EKG Interpretation Date/Time:  Friday September 15 2022 15:45:38 EDT Ventricular Rate:  81 PR Interval:  80 QRS Duration:  89 QT Interval:  437 QTC Calculation: 508 R Axis:   65  Text Interpretation: Ectopic atrial rhythm Short PR interval Anteroseptal infarct, old Nonspecific repol abnormality, diffuse leads Confirmed by Zadie Rhine ([id number]) on 09/16/2022 12:09:33 AM  I have reviewed the labs performed to date as well as medications administered while in observation.  Recent changes in the last 24 hours include none.  Plan  Current plan is for placement to psychiatric facility.    Bethann Berkshire, MD 09/16/22 [phone]

## 2022-09-17 LAB — POTASSIUM
Potassium: 3.3 mmol/L — ABNORMAL LOW (ref 3.5–5.1)
Potassium: 4.5 mmol/L (ref 3.5–5.1)

## 2022-09-17 MED ORDER — HALOPERIDOL 0.5 MG PO TABS
2.0000 mg | ORAL_TABLET | Freq: Three times a day (TID) | ORAL | Status: DC | PRN
  Administered 2022-09-17 – 2022-09-23 (×5): 2 mg via ORAL
  Filled 2022-09-17 (×5): qty 4

## 2022-09-17 MED ORDER — POTASSIUM CHLORIDE IN NACL 20-0.9 MEQ/L-% IV SOLN
Freq: Once | INTRAVENOUS | Status: AC
  Filled 2022-09-17: qty 1000

## 2022-09-17 MED ORDER — POTASSIUM CHLORIDE CRYS ER 20 MEQ PO TBCR
40.0000 meq | EXTENDED_RELEASE_TABLET | ORAL | Status: AC
  Administered 2022-09-17 (×4): 40 meq via ORAL
  Filled 2022-09-17 (×5): qty 2

## 2022-09-17 MED ORDER — POTASSIUM CHLORIDE CRYS ER 20 MEQ PO TBCR
40.0000 meq | EXTENDED_RELEASE_TABLET | Freq: Once | ORAL | Status: AC
  Administered 2022-09-17: 40 meq via ORAL
  Filled 2022-09-17: qty 2

## 2022-09-17 MED ORDER — LOPERAMIDE HCL 2 MG PO CAPS
2.0000 mg | ORAL_CAPSULE | Freq: Once | ORAL | Status: AC
  Administered 2022-09-17: 2 mg via ORAL
  Filled 2022-09-17: qty 1

## 2022-09-17 NOTE — Progress Notes (Signed)
Pt meets inpatient behavioral health placement. Per [facility] Antoin[facility] Cillo, RN there are no navigable beds within the [facility] system. CSW sent referral to out of network providers.   Destination  Service Provider Address Phone Fax  [facility]  [address] [place], [place] [address] [phone]  [facility]  [address], [place] [address] [phone]  [facility] Patient Placement  [facility], [place] [phone]  [facility]  [address] [place], [place] [address] [phone]  [facility]  [address], [place] [address] [phone]  [facility]  [address] [place] [address] [phone]  [facility]  [address] [place] [address] [phone]  [facility]  [address] [place] [address] [phone]  [facility]ED Management Behavioral Health  [place] [phone]  [facility]  [address] [place], [place] [phone]  [facility]  [address] [place] [address] [phone]  [facility]  [address] [place] [address] [phone]  [facility]  [address], [place] [address] [phone]  [facility]  [address] [place], [place] [address] [phone]  [facility]  [address] [place], [place] [address] [phone]  [facility]  [address] [place] [address] [phone]  [facility] Psychiatry [facility]  [place] [phone]  [facility]  [address] [place] [address] [phone]  [facility]  [address] [place] [address] [phone]  [facility]  [facility] [address]egino Bellow [place] [address] [phone]  [facility]  [address] [place] [address] [phone]  [facility] [address]  [address] [place] [address] [phone]  [facility]  [address] [place] [address] [phone]  [facility]  [address] [place], [place] [address] [phone]  [facility]  [address] [place] [address] [phone]  [facility]  [address] [place] [address] [phone]  [facility]  [address], [place] [address] [phone]  [facility]  [address] [place] [address] [phone]  [facility] [address]  [place] [address] [phone]  [facility]  [address] [place], [place] [address] [phone]  [facility]  [address] [place] [address] [phone]  [facility]  [address] [place] [address] [phone]    Maryjean Ka, MSW, [facility] 09/17/2022 1:27 PM

## 2022-09-17 NOTE — ED Notes (Signed)
Spoke with [employer] and Child psychotherapist from [place]. No geriatric beds are available at this time.

## 2022-09-17 NOTE — ED Notes (Signed)
Pt's daughter updated at this time about pt and plan of care.

## 2022-09-17 NOTE — ED Provider Notes (Signed)
Emergency Medicine Observation Re-evaluation Note  Mary Blevins is a 77 y.o. female, seen on rounds today.  Pt initially presented to the ED for complaints of Failure To Thrive Currently, the patient is sleeping.  Physical Exam  BP 126/85 (BP Location: Left Arm)   Pulse 80   Temp 98 F (36.7 C) (Oral)   Resp 16   Ht 5' 5.5" (1.664 m)   Wt 63 kg   SpO2 96%   BMI 22.78 kg/m  Physical Exam General: No distress  Lungs: No increased work of breathing Psych: Calm  ED Course / MDM  EKG:EKG Interpretation Date/Time:  Friday September 15 2022 15:45:38 EDT Ventricular Rate:  81 PR Interval:  80 QRS Duration:  89 QT Interval:  437 QTC Calculation: 508 R Axis:   65  Text Interpretation: Ectopic atrial rhythm Short PR interval Anteroseptal infarct, old Nonspecific repol abnormality, diffuse leads Confirmed by Zadie Rhine ([id number]) on 09/16/2022 12:09:33 AM  I have reviewed the labs performed to date as well as medications administered while in observation.  Recent changes in the last 24 hours include none.  Plan  Current plan is for behavioral health placement.  Patient has borderline low potassium, has received supplement.    Gerhard Munch, MD 09/17/22 [phone]

## 2022-09-17 NOTE — ED Notes (Signed)
Spoke with EDP about pt's potassium being "too low" for [facility] placement informed during shift report once potassium was up pt could go to [facility]. EDP verbalized the 40 mEq PO of potassium and to do IV potassium at this time.

## 2022-09-17 NOTE — ED Notes (Signed)
Pt crying and agitated, shaking table and bed rails. Pt stated "I'm tired of my daughter controlling me! That bitch,". Attempted to listen to pt and calm pt. MD notified of pt's behavior. New order for haldol given. Uses and side effects reviewed with pt.

## 2022-09-17 NOTE — ED Notes (Signed)
Updated BH at this time about plan for helping pt get potassium up at this time.

## 2022-09-17 NOTE — ED Notes (Signed)
Pt assisted up to bsc. Pt able to ambulate using cane. Pt assisted back to bed.

## 2022-09-18 ENCOUNTER — Other Ambulatory Visit: Payer: Self-pay

## 2022-09-18 DIAGNOSIS — F32A Depression, unspecified: Secondary | ICD-10-CM

## 2022-09-18 DIAGNOSIS — F03918 Unspecified dementia, unspecified severity, with other behavioral disturbance: Secondary | ICD-10-CM | POA: Diagnosis present

## 2022-09-18 MED ORDER — FLUTICASONE FUROATE-VILANTEROL 200-25 MCG/ACT IN AEPB
1.0000 | INHALATION_SPRAY | Freq: Every day | RESPIRATORY_TRACT | Status: DC
  Administered 2022-09-18 – 2022-09-26 (×9): 1 via RESPIRATORY_TRACT
  Filled 2022-09-18 (×3): qty 28

## 2022-09-18 MED ORDER — HYDROCHLOROTHIAZIDE 25 MG PO TABS
25.0000 mg | ORAL_TABLET | Freq: Every day | ORAL | Status: DC
  Administered 2022-09-18 – 2022-09-25 (×8): 25 mg via ORAL
  Filled 2022-09-18 (×9): qty 1

## 2022-09-18 MED ORDER — TIOTROPIUM BROMIDE MONOHYDRATE 2.5 MCG/ACT IN AERS: 2.0000 | INHALATION_SPRAY | Freq: Every day | RESPIRATORY_TRACT | Status: DC

## 2022-09-18 MED ORDER — MONTELUKAST SODIUM 10 MG PO TABS
10.0000 mg | ORAL_TABLET | Freq: Every day | ORAL | Status: DC
  Administered 2022-09-18 – 2022-09-25 (×8): 10 mg via ORAL
  Filled 2022-09-18 (×8): qty 1

## 2022-09-18 MED ORDER — UMECLIDINIUM BROMIDE 62.5 MCG/ACT IN AEPB
1.0000 | INHALATION_SPRAY | Freq: Every day | RESPIRATORY_TRACT | Status: DC
  Administered 2022-09-18 – 2022-09-25 (×8): 1 via RESPIRATORY_TRACT
  Filled 2022-09-18 (×3): qty 7

## 2022-09-18 NOTE — Consult Note (Signed)
Telepsych Consultation   Reason for Consult:  "Depression and Dementia" Referring Physician:  Benjiman Core, MD  Location of [facility]  Location of Provider: Other: [facility]   Patient Identification: Mary Blevins MRN:  [id number] Principal Diagnosis: Dementia with behavioral disturbance (HCC) Diagnosis:  Principal Problem:   Dementia with behavioral disturbance (HCC) Active Problems:   Depression   Total Time spent with patient: 45 minutes  Subjective:   Mary Blevins is a 77 y.o. female patient with a history of dementia, anxiety, COPD, hypertension, hyperlipidemia admitted with [facility]ins,due to worsening confusion related to dementia, family concerns regarding failure to thrive, and depression.  HPI:  [facility] admitted to [facility] on 09/15/2022. Patient initially seen by TTS on 09/15/2022 who recommended inpatient, despite patient denying any acute psychiatric concerns with the exception of altered mental status due to diagnosed medical condition of dementia. Patient [facility] and was not picked up due to not meeting inpatient psychiatric criteria.  Patient seen face to face via video telepsychichiatric consultative encounter. Patient is oriented to self and that she is at a hospital however, is disoriented to time, location, although partially oriented to situation. Patient tells this Clinical research associate, "I got mixed up". Patient admits to calling family members and "saying stuff", but can't recall exactly what was said. Patient lives alone. Endorses that things recently have gotten overwhelming and her daughter during this admission has become her POA. Patient denies SI/HI/AH/VH. Endorses having  a firearm at home which she thinks may be located on the back porch but uncertain.  During evaluation Ronney Lion is laying with head of the bed elevated, in no acute Her  mood is euthymic with congruent affect.  She has normal speech, and behavior.  Objectively there is no evidence of psychosis/mania or delusional thinking.  Patient is able to converse at times appears pleasantly confused, no distractibility, or pre-occupation.  She also denies suicidal/self-harm/homicidal ideation,and is not displaying symptoms or behaviors concerning for psychosis or paranoia. Patient answered question appropriately.      Collateral Information:  Spoke with daughter Mary Blevins, who reports she has encounter multiple dead ends trying to  get her mother placed in a facility. The biggest barrier is patient's income which is causing her to be in eligible for long-term Medicaid.  Patient was sected into a nursing home facility [facility] however patient's income disqualified her for Medicaid and family would be required an additional $7000 monthly in order for patient to be expected which was a financial obligation that family could not afford.  According to daughter Mary Blevins patient's home should be condemned.  She does not have running water in her bathtub or shower she is bathing in the sink, her floor forks are lax and patient could fall through the floor board at any time.  Patient has also had a hip surgery due to falls and daughter is concerned that if patient falls this may cause a problem with her hip replacement.  According to daughter home is not safe.  According to daughter she also placed an APS report due to concerns of safety however when APS arrived other family members were there in the home and she is uncertain if APS actually documented safety concerns.  Patient routinely lives alone and there is not family in the house on a routine basis.  Patient's daughter lives over an hour away and also endorses that generally speaking there is  no one in the home to check on her on a daily basis.  She reports she brought patient to the hospital after seeing her on 823 noticing that  multiple doses of her medications were missed some of her medication box and patient appeared desponded she brought her in for evaluation and she was concerned that patient may have overdosed on medications unintentionally.  Patient's daughter is welcome to any resources or help that can be provided as she is concerned for safety if patient were to return to the home.  Patient's daughter reports that she has not heard from social services and is uncertain as to what the outcome of the visit from APS.    Past Psychiatric History: See HPI  Risk to Self:  Yes, due to dementia and impaired cognitive status  Risk to Others:  none  Prior Inpatient Therapy:  None  Prior Outpatient Therapy:  None   Past Medical History:  Past Medical History:  Diagnosis Date   Arthritis    COPD (chronic obstructive pulmonary disease) (HCC)    Dyspnea    Elevated lipids    Generalized headaches    History of pneumonia    Hyperlipidemia    Hypertension    Hypothyroidism    MVP (mitral valve prolapse)    palpitations   Osteopenia    Seasonal allergies    Sinusitis    Tubular adenoma of colon     Past Surgical History:  Procedure Laterality Date   CATARACT EXTRACTION Left    COLONOSCOPY WITH PROPOFOL N/A 01/06/2016   Procedure: COLONOSCOPY WITH PROPOFOL;  Surgeon: Sherrilyn Rist, MD;  Location: [facility];  Service: Gastroenterology;  Laterality: N/A;   DILATION AND CURETTAGE OF UTERUS     THYROIDECTOMY     TONSILLECTOMY     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   Procedure: RIGHT TOTAL HIP ARTHROPLASTY ANTERIOR APPROACH;  Surgeon: Kathryne Hitch, MD;  Location: [facility];  Service: Orthopedics;  Laterality: Right;  Needs RNFA   Family History:  Family History  Problem Relation Age of Onset   Heart attack Mother    Heart disease Mother    Dementia Mother    Healthy Father    Healthy Sister    Diabetes Brother    Heart attack Maternal Grandmother    Prostate cancer Maternal Grandfather     Breast cancer Maternal Aunt    Heart attack Other        NIECE   Colon cancer Neg Hx    Esophageal cancer Neg Hx    Rectal cancer Neg Hx    Stomach cancer Neg Hx    Family Psychiatric  History: No pertinent family psychiatric history provided  Social History:  Social History   Substance and Sexual Activity  Alcohol Use Yes   Comment: rare     Social History   Substance and Sexual Activity  Drug Use No    Social History   Socioeconomic History   Marital status: Widowed    Spouse name: Not on file   Number of children: 0   Years of education: 69   Highest education level: 12th grade  Occupational History    Employer: [employer]  Tobacco Use   Smoking status: Former    Current packs/day: 0.00    Types: Cigarettes    Quit date: 07/24/2005    Years since quitting: 17.1    Passive exposure: Past   Smokeless tobacco: Never  Vaping Use   Vaping status:  Never Used  Substance and Sexual Activity   Alcohol use: Yes    Comment: rare   Drug use: No   Sexual activity: Not Currently    Partners: Male  Other Topics Concern   Not on file  Social History Narrative   Lives alone   Social Determinants of Health   Financial Resource Strain: Low Risk  (08/09/2022)   Overall Financial Resource Strain (CARDIA)    Difficulty of Paying Living Expenses: Not hard at all  Food Insecurity: No Food Insecurity (08/09/2022)   Hunger Vital Sign    Worried About Running Out of Food in the Last Year: Never true    Ran Out of Food in the Last Year: Never true  Transportation Needs: No Transportation Needs (08/09/2022)   PRAPARE - Administrator, Civil Service (Medical): No    Lack of Transportation (Non-Medical): No  Physical Activity: Inactive (08/09/2022)   Exercise Vital Sign    Days of Exercise per Week: 0 days    Minutes of Exercise per Session: 0 min  Stress: No Stress Concern Present (08/09/2022)   [employer] of Occupational Health - Occupational  Stress Questionnaire    Feeling of Stress : Only a little  Social Connections: Moderately Integrated (08/09/2022)   Social Connection and Isolation Panel [NHANES]    Frequency of Communication with Friends and Family: More than three times a week    Frequency of Social Gatherings with Friends and Family: More than three times a week    Attends Religious Services: More than 4 times per year    Active Member of [employer] or Organizations: Yes    Attends Banker Meetings: More than 4 times per year    Marital Status: Widowed   Additional Social History:    Allergies:   Allergies  Allergen Reactions   Oxycodone Other (See Comments)    Hallucinations    Meloxicam Other (See Comments)    BAD DREAMS, SAD THOUGHTS    Labs:  Results for orders placed or performed during the hospital encounter of 09/15/22 (from the past 48 hour(s))  Potassium     Status: Abnormal   Collection Time: 09/17/22  6:24 AM  Result Value Ref Range   Potassium 3.3 (L) 3.5 - 5.1 mmol/L    Comment: Performed at [facility], [address], [place], [place] [address]  Potassium     Status: None   Collection Time: 09/17/22 12:14 PM  Result Value Ref Range   Potassium 4.5 3.5 - 5.1 mmol/L    Comment: Performed at [facility], [address], [place], [place] [address]    Medications:  Current Facility-Administered Medications  Medication Dose Route Frequency Provider Last Rate Last Admin   albuterol (VENTOLIN HFA) 108 (90 Base) MCG/ACT inhaler 2 puff  2 puff Inhalation Q6H PRN Benjiman Core, MD       donepezil (ARICEPT) tablet 5 mg  5 mg Oral QHS Benjiman Core, MD   5 mg at 09/17/22 2142   fluticasone furoate-vilanterol (BREO ELLIPTA) 200-25 MCG/ACT 1 puff  1 puff Inhalation Daily Tanda Rockers A, DO   1 puff at 09/18/22 1116   haloperidol (HALDOL) tablet 2 mg  2 mg Oral Q8H PRN Eber Hong, MD   2 mg at 09/17/22 1632   hydrochlorothiazide (HYDRODIURIL) tablet 25 mg  25 mg Oral Daily Tanda Rockers A, DO   25 mg at 09/18/22 1836   levothyroxine (SYNTHROID) tablet 50 mcg  50 mcg Oral Q0600 Pickering,  Harrold Donath, MD   50 mcg at 09/18/22 0537   montelukast (SINGULAIR) tablet 10 mg  10 mg Oral QHS Tanda Rockers A, DO       umeclidinium bromide (INCRUSE ELLIPTA) 62.5 MCG/ACT 1 puff  1 puff Inhalation Daily Tanda Rockers A, DO   1 puff at 09/18/22 1116   Current Outpatient Medications  Medication Sig Dispense Refill   albuterol (PROVENTIL) (2.5 MG/3ML) 0.083% nebulizer solution INHALE 3 ML BY NEBULIZATION EVERY 6 HOURS AS NEEDED FOR WHEEZING OR SHORTNESS OF BREATH 300 mL 3   albuterol (VENTOLIN HFA) 108 (90 Base) MCG/ACT inhaler Inhale 2 puffs into the lungs every 6 (six) hours as needed for wheezing or shortness of breath. 8 g 6   budesonide-formoterol (SYMBICORT) 160-4.5 MCG/ACT inhaler Inhale 2 puffs into the lungs 2 (two) times daily. 6 g 0   diclofenac (VOLTAREN) 75 MG EC tablet TAKE 1 TABLET BY MOUTH TWICE A DAY 60 tablet 1   donepezil (ARICEPT) 5 MG tablet Take 5 mg by mouth at bedtime.     hydrochlorothiazide (HYDRODIURIL) 25 MG tablet Take 1 tablet (25 mg total) by mouth daily. 90 tablet 3   levothyroxine (SYNTHROID) 50 MCG tablet TAKE 1 TABLET BY MOUTH EVERY DAY BEFORE BREAKFAST 90 tablet 3   montelukast (SINGULAIR) 10 MG tablet TAKE 1 TABLET BY MOUTH EVERYDAY AT BEDTIME 90 tablet 0   Tiotropium Bromide Monohydrate (SPIRIVA RESPIMAT) 2.5 MCG/ACT AERS Inhale 2 puffs into the lungs daily. 4 g 11    Musculoskeletal: Strength & Muscle Tone: within normal limits Gait & Station:  patient was not walking during telepsychiatric evaluatin  Patient leans: N/A          Psychiatric Specialty Exam:  Presentation  General Appearance: Appropriate for Environment  Eye Contact:Fair  Speech:Clear and Coherent  Speech Volume:Normal  Handedness:Right   Mood and Affect  Mood:Euthymic  Affect:Restricted   Thought Process  Thought Processes:Irrevelant  Descriptions of  Associations:Circumstantial  Orientation:Partial  Thought Content:Other (comment)  History of Schizophrenia/Schizoaffective disorder:No  Duration of Psychotic Symptoms:No data recorded Hallucinations:Hallucinations: None  Ideas of Reference:Other (comment)  Suicidal Thoughts:Suicidal Thoughts: No  Homicidal Thoughts:Homicidal Thoughts: No   Sensorium  Memory:Remote Poor; Immediate Poor; Recent Poor  Judgment:Impaired  Insight:Shallow   Executive Functions  Concentration:Poor  Attention Span:Poor  Recall:Poor  Fund of Knowledge:Good  Language:Good   Psychomotor Activity  Psychomotor Activity:Psychomotor Activity: Normal   Assets  Assets:Financial Resources/Insurance; Physical Health; Social Support; Manufacturing systems engineer; Desire for Improvement   Sleep  Sleep:Sleep: Good Number of Hours of Sleep: 0 (Patient reports good sleep unable to quantify)    Physical Exam: Physical Exam Speaking in clear sentences. Breathing pattern is audibly normal.  Patient is asking and responding to questions appropriately.  Review of Systems  All other systems reviewed and are negative.   Blood pressure (!) 130/90, pulse 96, temperature 98.1 F (36.7 C), resp. rate 17, height 5' 5.5" (1.664 m), weight 63 kg, SpO2 100%. Body mass index is 22.78 kg/m.  Treatment Plan Summary: Patient is psychiatrically cleared however will need SW to assist with discharge planning given safety concerns raised by daughter regarding current living situation.  Disposition: No evidence of imminent risk to self or others at present.   Patient does not meet criteria for psychiatric inpatient admission. SW to assist with discharge placement, most appropriate  disposition recommendations , memory care unit.  This service was provided via telemedicine using a 2-way, interactive audio and video technology.  Names of all persons participating in  this telemedicine service and their role in this  encounter. Name: Antonietta Breach  Role: Patient   Name: Joaquin Courts, NP  Role:Psychiatric Nurse Practitioner    Joaquin Courts, NP 09/18/2022 7:29 PM

## 2022-09-18 NOTE — ED Notes (Signed)
Assisted pt to side of bed for lunch tray; pt states she does not need any assistance at this time

## 2022-09-18 NOTE — Progress Notes (Signed)
Per provider Roderic Ovens pt has been psych cleared. TOC is active with case. This CSW will now remove pt from the [facility] shift report. TOC to assist with discharge needs.    Maryjean Ka, MSW, [facility] 09/18/2022 9:47 PM

## 2022-09-18 NOTE — TOC Initial Note (Signed)
Transition of Care [facility]) - Initial/Assessment Note    Patient Details  Name: Mary Blevins MRN: [id number] Date of Birth: November 19, 1945  Transition of Care [facility]) CM/SW Contact:    Mary Needy, LCSW Phone Number: 09/18/2022, 2:41 PM  Clinical Narrative:                 Patient from home alone. TOC consulted for SNF placement. APS is involved. Message left for niece, Mary Blevins. Spoke with daughter, Mary Blevins. She reports: patient's home is not livable due to hoarding, roof leaking, floor collapsing, no water running in the bathroom. Patient uses a BSC then pours it in the toilet then pours water in the toilet to make it flush. Patient sits in the dark, lights candles; however, she has electricity. Patient has a cane. She does not take baths as she should. Patient's income is $2039/month and she does not qualify for Medicaid. Patient's PCP completed a FL2 for Mary Blevins and she states that she has been trying to find her somewhere. She has also placed patient's name on the senior living housing assistance list. No family has room or is unwilling to allow patient to live with them. Mary Blevins states that she and patient have a toxic relationship and that she and patient have been estranged since late 2022. She states that patient's niece calls patient at 8 a.m. to tell her to take her medications and then comes to the house about 4 p.m. to check on patient and give her medications.  Message left for APS social worker, Mary Blevins, [phone] x [phone].  PT evaluation requested from ED provider. Message left for niece, Mary Blevins.          Patient Goals and CMS Choice            Expected Discharge Plan and Services                                              Prior Living Arrangements/Services   Lives with:: Self Patient language and need for interpreter reviewed:: Yes        Need for Family Participation in Patient Care: Yes (Comment) Care giver  support system in place?: No (comment) Current home services: DME (cane, BSC) Criminal Activity/Legal Involvement Pertinent to Current Situation/Hospitalization: No - Comment as needed  Activities of Daily Living      Permission Sought/Granted Permission sought to share information with : Family Supports    Share Information with NAME: Message left for niece, Mary Blevins, spoke wtih daughter Mary Blevins           Emotional Assessment           Psych Involvement: No (comment)  Admission diagnosis:  Mental clearance Patient Active Problem List   Diagnosis Date Noted   COPD with acute exacerbation (HCC) 02/25/2021   Decreased urine output 09/26/2020    Class: Acute   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   Unilateral primary osteoarthritis, right hip 09/23/2020   Leg cramping 01/07/2020   GAD (generalized anxiety disorder) 12/20/2016   Chronic insomnia 12/20/2016   Aortic atherosclerosis (HCC) 06/09/2016   Benign neoplasm of ascending colon    Benign neoplasm of transverse colon    Benign neoplasm of sigmoid colon    Diverticulosis of sigmoid colon    Grade I internal hemorrhoids    PSVT (paroxysmal  supraventricular tachycardia) 02/17/2015   Glaucoma suspect of both eyes 05/30/2013   Nuclear sclerosis 05/30/2013   Back pain 07/22/2012   Acute pain of left hip 07/22/2012   Stricture and stenosis of esophagus 09/18/2011   Hx of colonic polyps 09/18/2011   COPD (chronic obstructive pulmonary disease) (HCC) 07/14/2010   Generalized headaches 07/14/2010   Osteopenia 07/14/2010   Hypothyroid 07/14/2010   Elevated lipids 07/14/2010   PCP:  Mary Brooks, MD Pharmacy:   [facility] [phone] - [place], [place] - [address] AT [facility] [address] [place] [address] Phone: [phone] Fax: [phone]     Social Determinants of Health (SDOH) Social History: SDOH Screenings   Food Insecurity: No Food Insecurity  (08/09/2022)  Housing: Low Risk  (08/09/2022)  Transportation Needs: No Transportation Needs (08/09/2022)  Utilities: Not At Risk (08/09/2022)  Alcohol Screen: Low Risk  (08/09/2022)  Depression (PHQ2-9): Low Risk  (08/09/2022)  Financial Resource Strain: Low Risk  (08/09/2022)  Physical Activity: Inactive (08/09/2022)  Social Connections: Moderately Integrated (08/09/2022)  Stress: No Stress Concern Present (08/09/2022)  Tobacco Use: Medium Risk (09/15/2022)  Health Literacy: Adequate Health Literacy (08/09/2022)   SDOH Interventions:     Readmission Risk Interventions     No data to display

## 2022-09-18 NOTE — ED Provider Notes (Signed)
Emergency Medicine Observation Re-evaluation Note  Mary Blevins is a 77 y.o. female, seen on rounds today.  Pt initially presented to the ED for complaints of Failure To Thrive Currently, the patient is sitting up in bed.  Physical Exam  BP (!) 145/95 (BP Location: Left Arm)   Pulse 74   Temp 98.1 F (36.7 C) (Oral)   Resp 16   Ht 5' 5.5" (1.664 m)   Wt 63 kg   SpO2 98%   BMI 22.78 kg/m  Physical Exam General: NAD Cardiac: extremities well perfused Lungs: no resp distress Psych: cooperative  ED Course / MDM  EKG:EKG Interpretation Date/Time:  Friday September 15 2022 15:45:38 EDT Ventricular Rate:  81 PR Interval:  80 QRS Duration:  89 QT Interval:  437 QTC Calculation: 508 R Axis:   65  Text Interpretation: Ectopic atrial rhythm Short PR interval Anteroseptal infarct, old Nonspecific repol abnormality, diffuse leads Confirmed by Zadie Rhine ([id number]) on 09/16/2022 12:09:33 AM  I have reviewed the labs performed to date as well as medications administered while in observation.  Recent changes in the last 24 hours include patient was agitated yesterday, did require Haldol, she is pending placement.  Patient reports that she is feeling okay this morning and she does not feel suicidal, no needs expressed when prompted   Plan  Current plan is for placement, Geri psych.    Sloan Leiter, DO 09/18/22 [phone]

## 2022-09-18 NOTE — TOC Progression Note (Signed)
Transition of Care [facility]) - Progression Note    Patient Details  Name: Mary Blevins MRN: [id number] Date of Birth: 09/15/1945  [facility] CM/SW Contact  Annice Needy, LCSW Phone Number: 09/18/2022, 4:11 PM  Clinical Narrative:    Spoke with APS social worker, Barnet Pall, who indicated that patient's case was accepted by APS on 8/16.  PT eval pending. Gearldine Bienenstock will come to hospital to see patient on tomorrow.         Expected Discharge Plan and Services                                               Social Determinants of Health (SDOH) Interventions SDOH Screenings   Food Insecurity: No Food Insecurity (08/09/2022)  Housing: Low Risk  (08/09/2022)  Transportation Needs: No Transportation Needs (08/09/2022)  Utilities: Not At Risk (08/09/2022)  Alcohol Screen: Low Risk  (08/09/2022)  Depression (PHQ2-9): Low Risk  (08/09/2022)  Financial Resource Strain: Low Risk  (08/09/2022)  Physical Activity: Inactive (08/09/2022)  Social Connections: Moderately Integrated (08/09/2022)  Stress: No Stress Concern Present (08/09/2022)  Tobacco Use: Medium Risk (09/15/2022)  Health Literacy: Adequate Health Literacy (08/09/2022)    Readmission Risk Interventions     No data to display

## 2022-09-19 MED ORDER — LEVOTHYROXINE SODIUM 50 MCG PO TABS: ORAL_TABLET | ORAL | 1 refills | Status: AC

## 2022-09-19 NOTE — Evaluation (Signed)
Physical Therapy Evaluation Patient Details Name: Mary Blevins MRN: [id number] DOB: 1945-07-16 Today's Date: 09/19/2022  History of Present Illness  Mary Blevins is a 77 y.o. female.     HPI  Patient brought in by family member.  Reportedly has had dementia and worsening depression.  Has been eating less.  Reportedly is that she is not suicidal but reportedly sort of shrugs when she says it.  Decreased appetite.  Per the family ember patient has been lighting candles around the house and forgetting about them.  They have been attempting to get her into a nursing home, however reportedly cannot get it paid for.   Clinical Impression  Patient demonstrates slow labored movement for sitting up at bedside, very unsteady on feet requiring Min hand held assist when using quad-cane, had to use RW for safety for rest of gait training and limited mostly due to fatigue and generalized weakness.  Patient will benefit from continued skilled physical therapy in hospital and recommended venue below to increase strength, balance, endurance for safe ADLs and gait.          If plan is discharge home, recommend the following: A lot of help with walking and/or transfers;A little help with bathing/dressing/bathroom;Assistance with cooking/housework;Help with stairs or ramp for entrance   Can travel by private vehicle   Yes    Equipment Recommendations None recommended by PT  Recommendations for Other Services       Functional Status Assessment Patient has had a recent decline in their functional status and demonstrates the ability to make significant improvements in function in a reasonable and predictable amount of time.     Precautions / Restrictions Precautions Precautions: Fall Restrictions Weight Bearing Restrictions: No      Mobility  Bed Mobility Overal bed mobility: Needs Assistance Bed Mobility: Supine to Sit, Sit to Supine     Supine to sit: Min assist, Contact guard Sit  to supine: Min assist, Contact guard assist   General bed mobility comments: increased time, labored movement    Transfers Overall transfer level: Needs assistance Equipment used: Rolling walker (2 wheels), Quad cane Transfers: Sit to/from Stand, Bed to chair/wheelchair/BSC Sit to Stand: Min assist   Step pivot transfers: Min assist       General transfer comment: very unsteady using quad-cane, required RW for safety    Ambulation/Gait Ambulation/Gait assistance: Min assist Gait Distance (Feet): 40 Feet Assistive device: Rolling walker (2 wheels) Gait Pattern/deviations: Decreased step length - right, Decreased step length - left, Decreased stride length Gait velocity: decreased     General Gait Details: slow labored cadence requiring increased time for making turns, limited mostly due to c/o fatigue  Stairs            Wheelchair Mobility     Tilt Bed    Modified Rankin (Stroke Patients Only)       Balance Overall balance assessment: Needs assistance Sitting-balance support: Feet unsupported, No upper extremity supported Sitting balance-Leahy Scale: Fair Sitting balance - Comments: fair/good seated at EOB   Standing balance support: Reliant on assistive device for balance, During functional activity, Single extremity supported Standing balance-Leahy Scale: Poor Standing balance comment: fair/poor using Quad-cane, fair using RW                             Pertinent Vitals/Pain Pain Assessment Pain Assessment: No/denies pain    Home Living Family/patient expects to be discharged to:: Private residence  Living Arrangements: Alone Available Help at Discharge: Family;Available PRN/intermittently Type of Home: House Home Access: Stairs to enter Entrance Stairs-Rails: Right Entrance Stairs-Number of Steps: 3-4   Home Layout: Two level Home Equipment: Cane - quad      Prior Function Prior Level of Function : Needs assist       Physical  Assist : Mobility (physical);ADLs (physical) Mobility (physical): Bed mobility;Transfers;Gait;Stairs   Mobility Comments: household and short distanced community ambulator using quad-cane ADLs Comments: Assisted by family     Extremity/Trunk Assessment   Upper Extremity Assessment Upper Extremity Assessment: Generalized weakness    Lower Extremity Assessment Lower Extremity Assessment: Generalized weakness    Cervical / Trunk Assessment Cervical / Trunk Assessment: Normal  Communication   Communication Communication: No apparent difficulties Cueing Techniques: Verbal cues;Tactile cues  Cognition Arousal: Alert Behavior During Therapy: WFL for tasks assessed/performed Overall Cognitive Status: History of cognitive impairments - at baseline                                          General Comments      Exercises     Assessment/Plan    PT Assessment Patient needs continued PT services  PT Problem List Decreased strength;Decreased balance;Decreased mobility;Decreased activity tolerance       PT Treatment Interventions DME instruction;Gait training;Stair training;Functional mobility training;Therapeutic activities;Therapeutic exercise;Balance training;Patient/family education    PT Goals (Current goals can be found in the Care Plan section)  Acute Rehab PT Goals Patient Stated Goal: return home with family to assist PT Goal Formulation: With patient Time For Goal Achievement: 10/03/22 Potential to Achieve Goals: Good    Frequency Min 2X/week     Co-evaluation               AM-PAC PT "6 Clicks" Mobility  Outcome Measure Help needed turning from your back to your side while in a flat bed without using bedrails?: A Little Help needed moving from lying on your back to sitting on the side of a flat bed without using bedrails?: A Little Help needed moving to and from a bed to a chair (including a wheelchair)?: A Little Help needed standing up  from a chair using your arms (e.g., wheelchair or bedside chair)?: A Little Help needed to walk in hospital room?: A Lot Help needed climbing 3-5 steps with a railing? : A Lot 6 Click Score: 16    End of Session   Activity Tolerance: Patient tolerated treatment well;Patient limited by fatigue Patient left: in bed;with call bell/phone within reach Nurse Communication: Mobility status PT Visit Diagnosis: Unsteadiness on feet (R26.81);Other abnormalities of gait and mobility (R26.89);Muscle weakness (generalized) (M62.81)    Time: 9604-5409 PT Time Calculation (min) (ACUTE ONLY): 20 min   Charges:   PT Evaluation $PT Eval Moderate Complexity: 1 Mod PT Treatments $Therapeutic Activity: 8-22 mins PT General Charges $$ ACUTE PT VISIT: 1 Visit         12:30 PM, 09/19/22 Ocie Bob, MPT Physical Therapist with [facility] [phone] office [phone] mobile phone

## 2022-09-19 NOTE — ED Notes (Signed)
Pt given the phone to speak with daughter.

## 2022-09-19 NOTE — ED Notes (Signed)
CSW spoke with pt about PT recommendation of SNF placement. Pt is agreeable to SNF referral being sent out to local facilities. CSW to send out and follow up with pt on bed offers when able. TOC to follow.

## 2022-09-19 NOTE — ED Provider Notes (Signed)
Emergency Medicine Observation Re-evaluation Note  Mary Blevins is a 77 y.o. female with a history of dementia, seen on rounds today.  Pt initially presented to the ED for complaints of Failure To Thrive Currently, the patient is not having any new complaints.  Physical Exam  BP 123/79 (BP Location: Left Arm)   Pulse 76   Temp 98.4 F (36.9 C) (Oral)   Resp 14   Ht 5' 5.5" (1.664 m)   Wt 63 kg   SpO2 97%   BMI 22.78 kg/m  Physical Exam General: Resting comfortably in stretcher, watching television Lungs: Normal work of breathing Psych: Calm  ED Course / MDM  EKG:EKG Interpretation Date/Time:  Friday September 15 2022 15:45:38 EDT Ventricular Rate:  81 PR Interval:  80 QRS Duration:  89 QT Interval:  437 QTC Calculation: 508 R Axis:   65  Text Interpretation: Ectopic atrial rhythm Short PR interval Anteroseptal infarct, old Nonspecific repol abnormality, diffuse leads Confirmed by Zadie Rhine ([id number]) on 09/16/2022 12:09:33 AM  I have reviewed the labs performed to date as well as medications administered while in observation.  Recent changes in the last 24 hours include seen by psychiatry who cleared the patient and thinks that she may benefit from a memory care unit.  Social work has been engaged and is working on placement.  Awaiting PT evaluation.  Plan  Current plan is for PT evaluation and placement.    Rondel Baton, MD 09/19/22 [phone]

## 2022-09-19 NOTE — ED Notes (Signed)
Pt requesting to speak with the doctor about depression. Pt is tearful at this time. Denies needing to use the BR, denies pain--MD made aware

## 2022-09-19 NOTE — ED Notes (Signed)
Patient in room crying saying she is depressed. She is upset with daughter and states "her day is coming." Ambulated patient to restroom with supervision and assistance with cane

## 2022-09-19 NOTE — ED Notes (Cosign Needed Addendum)
Transition of Care (TOC) Dementia Primary        Transition of Care [facility]) CM/SW Contact  Name: Elliot Gault Phone Number: [phone]      To Whom it May Concern:   Please be advised that the above patient has a primary diagnosis of dementia which supersedes any psychiatric diagnosis.

## 2022-09-19 NOTE — NC FL2 (Signed)
[place] MEDICAID FL2 LEVEL OF CARE FORM     IDENTIFICATION  Patient Name: Mary Blevins Birthdate: 1945-07-02 Sex: female Admission Date (Current Location): 09/15/2022  [facility] and [place] Number:  [employer] and Address:  [facility],  [address], [place] [address]      Provider Number: [id number]  Attending Physician Name and Address:  Default, Provider, MD  Relative Name and Phone Number:       Current Level of Care: Hospital Recommended Level of Care: Skilled Nursing Facility Prior Approval Number:    Date Approved/Denied:   PASRR Number:    Discharge Plan: SNF    Current Diagnoses: Patient Active Problem List   Diagnosis Date Noted   Dementia with behavioral disturbance (HCC) 09/18/2022   Depression 09/18/2022   COPD with acute exacerbation (HCC) 02/25/2021   Decreased urine output 09/26/2020   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   Unilateral primary osteoarthritis, right hip 09/23/2020   Leg cramping 01/07/2020   GAD (generalized anxiety disorder) 12/20/2016   Chronic insomnia 12/20/2016   Aortic atherosclerosis (HCC) 06/09/2016   Benign neoplasm of ascending colon    Benign neoplasm of transverse colon    Benign neoplasm of sigmoid colon    Diverticulosis of sigmoid colon    Grade I internal hemorrhoids    PSVT (paroxysmal supraventricular tachycardia) 02/17/2015   Glaucoma suspect of both eyes 05/30/2013   Nuclear sclerosis 05/30/2013   Back pain 07/22/2012   Acute pain of left hip 07/22/2012   Stricture and stenosis of esophagus 09/18/2011   Hx of colonic polyps 09/18/2011   COPD (chronic obstructive pulmonary disease) (HCC) 07/14/2010   Generalized headaches 07/14/2010   Osteopenia 07/14/2010   Hypothyroid 07/14/2010   Elevated lipids 07/14/2010    Orientation RESPIRATION BLADDER Height & Weight     Self, Time, Situation, Place  Normal Continent Weight: 139 lb (63 kg) Height:  5' 5.5"  (166.4 cm)  BEHAVIORAL SYMPTOMS/MOOD NEUROLOGICAL BOWEL NUTRITION STATUS      Continent Diet (regular)  AMBULATORY STATUS COMMUNICATION OF NEEDS Skin   Extensive Assist Verbally Normal                       Personal Care Assistance Level of Assistance  Bathing, Feeding, Dressing Bathing Assistance: Limited assistance Feeding assistance: Independent Dressing Assistance: Limited assistance     Functional Limitations Info  Sight, Hearing, Speech Sight Info: Adequate Hearing Info: Adequate Speech Info: Adequate    SPECIAL CARE FACTORS FREQUENCY  PT (By licensed PT), OT (By licensed OT)     PT Frequency: 5x week OT Frequency: 5x week            Contractures Contractures Info: Not present    Additional Factors Info  Code Status, Allergies, Psychotropic Code Status Info: Full Allergies Info: Oxycodone, Meloxicam Psychotropic Info: Cymbalta         Current Medications (09/19/2022):  This is the current hospital active medication list Current Facility-Administered Medications  Medication Dose Route Frequency Provider Last Rate Last Admin   albuterol (VENTOLIN HFA) 108 (90 Base) MCG/ACT inhaler 2 puff  2 puff Inhalation Q6H PRN Benjiman Core, MD       donepezil (ARICEPT) tablet 5 mg  5 mg Oral QHS Benjiman Core, MD   5 mg at 09/18/22 2200   fluticasone furoate-vilanterol (BREO ELLIPTA) 200-25 MCG/ACT 1 puff  1 puff Inhalation Daily Tanda Rockers A, DO   1 puff at 09/19/22 [phone]  haloperidol (HALDOL) tablet 2 mg  2 mg Oral Q8H PRN Eber Hong, MD   2 mg at 09/17/22 1632   hydrochlorothiazide (HYDRODIURIL) tablet 25 mg  25 mg Oral Daily Tanda Rockers A, DO   25 mg at 09/19/22 1137   levothyroxine (SYNTHROID) tablet 50 mcg  50 mcg Oral Q0600 Benjiman Core, MD   50 mcg at 09/19/22 1137   montelukast (SINGULAIR) tablet 10 mg  10 mg Oral QHS Tanda Rockers A, DO   10 mg at 09/18/22 2200   umeclidinium bromide (INCRUSE ELLIPTA) 62.5 MCG/ACT 1 puff  1 puff Inhalation  Daily Tanda Rockers A, DO   1 puff at 09/19/22 2956   Current Outpatient Medications  Medication Sig Dispense Refill   albuterol (PROVENTIL) (2.5 MG/3ML) 0.083% nebulizer solution INHALE 3 ML BY NEBULIZATION EVERY 6 HOURS AS NEEDED FOR WHEEZING OR SHORTNESS OF BREATH 300 mL 3   albuterol (VENTOLIN HFA) 108 (90 Base) MCG/ACT inhaler Inhale 2 puffs into the lungs every 6 (six) hours as needed for wheezing or shortness of breath. 8 g 6   budesonide-formoterol (SYMBICORT) 160-4.5 MCG/ACT inhaler Inhale 2 puffs into the lungs 2 (two) times daily. 6 g 0   diclofenac (VOLTAREN) 75 MG EC tablet TAKE 1 TABLET BY MOUTH TWICE A DAY 60 tablet 1   donepezil (ARICEPT) 5 MG tablet Take 5 mg by mouth at bedtime.     hydrochlorothiazide (HYDRODIURIL) 25 MG tablet Take 1 tablet (25 mg total) by mouth daily. 90 tablet 3   montelukast (SINGULAIR) 10 MG tablet TAKE 1 TABLET BY MOUTH EVERYDAY AT BEDTIME 90 tablet 0   Tiotropium Bromide Monohydrate (SPIRIVA RESPIMAT) 2.5 MCG/ACT AERS Inhale 2 puffs into the lungs daily. 4 g 11   levothyroxine (SYNTHROID) 50 MCG tablet TAKE 1 TABLET BY MOUTH EVERY DAY BEFORE BREAKFAST 90 tablet 1     Discharge Medications: Please see discharge summary for a list of discharge medications.  Relevant Imaging Results:  Relevant Lab Results:   Additional Information SSN: [id number], LCSW

## 2022-09-19 NOTE — ED Notes (Signed)
Pt recommends SNF/rehab placement for pt

## 2022-09-19 NOTE — Plan of Care (Signed)
  Problem: Acute Rehab PT Goals(only PT should resolve) Goal: Pt Will Go Supine/Side To Sit Outcome: Progressing Flowsheets (Taken 09/19/2022 1232) Pt will go Supine/Side to Sit:  with supervision  with contact guard assist Goal: Patient Will Transfer Sit To/From Stand Outcome: Progressing Flowsheets (Taken 09/19/2022 1232) Patient will transfer sit to/from stand:  with supervision  with contact guard assist Goal: Pt Will Transfer Bed To Chair/Chair To Bed Outcome: Progressing Flowsheets (Taken 09/19/2022 1232) Pt will Transfer Bed to Chair/Chair to Bed:  with supervision  with contact guard assist Goal: Pt Will Ambulate Outcome: Progressing Flowsheets (Taken 09/19/2022 1232) Pt will Ambulate:  75 feet  with supervision  with contact guard assist  with rolling walker   12:32 PM, 09/19/22 Ocie Bob, MPT Physical Therapist with [facility] [phone] office [phone] mobile phone

## 2022-09-19 NOTE — ED Notes (Signed)
PT evaluated pt and recommends SNK/ Rehab

## 2022-09-20 MED ORDER — ACETAMINOPHEN 325 MG PO TABS
650.0000 mg | ORAL_TABLET | ORAL | Status: DC | PRN
  Administered 2022-09-20 – 2022-09-21 (×3): 650 mg via ORAL
  Filled 2022-09-20 (×3): qty 2

## 2022-09-20 NOTE — ED Notes (Signed)
Pt assisted to the restroom. Pt now sitting up in bed eating lunch tray.

## 2022-09-20 NOTE — NC FL2 (Signed)
[place] MEDICAID FL2 LEVEL OF CARE FORM     IDENTIFICATION  Patient Name: Mary Blevins Birthdate: October 07, 1945 Sex: female Admission Date (Current Location): 09/15/2022  [facility] and [place] Number:  [employer] and Address:  [facility],  [address], [place] [address]      Provider Number: [id number]  Attending Physician Name and Address:  Default, Provider, MD  Relative Name and Phone Number:       Current Level of Care: Hospital Recommended Level of Care: Skilled Nursing Facility Prior Approval Number:    Date Approved/Denied:   PASRR Number:    Discharge Plan: SNF    Current Diagnoses: Patient Active Problem List   Diagnosis Date Noted   Dementia with behavioral disturbance (HCC) 09/18/2022   Depression 09/18/2022   COPD with acute exacerbation (HCC) 02/25/2021   Decreased urine output 09/26/2020   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   Unilateral primary osteoarthritis, right hip 09/23/2020   Leg cramping 01/07/2020   GAD (generalized anxiety disorder) 12/20/2016   Chronic insomnia 12/20/2016   Aortic atherosclerosis (HCC) 06/09/2016   Benign neoplasm of ascending colon    Benign neoplasm of transverse colon    Benign neoplasm of sigmoid colon    Diverticulosis of sigmoid colon    Grade I internal hemorrhoids    PSVT (paroxysmal supraventricular tachycardia) 02/17/2015   Glaucoma suspect of both eyes 05/30/2013   Nuclear sclerosis 05/30/2013   Back pain 07/22/2012   Acute pain of left hip 07/22/2012   Stricture and stenosis of esophagus 09/18/2011   Hx of colonic polyps 09/18/2011   COPD (chronic obstructive pulmonary disease) (HCC) 07/14/2010   Generalized headaches 07/14/2010   Osteopenia 07/14/2010   Hypothyroid 07/14/2010   Elevated lipids 07/14/2010    Orientation RESPIRATION BLADDER Height & Weight     Self, Time, Situation, Place  Normal Continent Weight: 139 lb (63 kg) Height:  5' 5.5"  (166.4 cm)  BEHAVIORAL SYMPTOMS/MOOD NEUROLOGICAL BOWEL NUTRITION STATUS      Continent Diet (regular)  AMBULATORY STATUS COMMUNICATION OF NEEDS Skin   Extensive Assist Verbally Normal                       Personal Care Assistance Level of Assistance  Bathing, Feeding, Dressing Bathing Assistance: Limited assistance Feeding assistance: Independent Dressing Assistance: Limited assistance     Functional Limitations Info  Sight, Hearing, Speech Sight Info: Adequate Hearing Info: Adequate Speech Info: Adequate    SPECIAL CARE FACTORS FREQUENCY  PT (By licensed PT), OT (By licensed OT)     PT Frequency: 5x week OT Frequency: 5x week            Contractures Contractures Info: Not present    Additional Factors Info  Code Status, Allergies, Psychotropic Code Status Info: Full Allergies Info: Oxycodone, Meloxicam Psychotropic Info: Cymbalta         Current Medications (09/20/2022):  This is the current hospital active medication list Current Facility-Administered Medications  Medication Dose Route Frequency Provider Last Rate Last Admin   acetaminophen (TYLENOL) tablet 650 mg  650 mg Oral Q4H PRN Pricilla Loveless, MD   650 mg at 09/20/22 0810   albuterol (VENTOLIN HFA) 108 (90 Base) MCG/ACT inhaler 2 puff  2 puff Inhalation Q6H PRN Benjiman Core, MD       donepezil (ARICEPT) tablet 5 mg  5 mg Oral QHS Benjiman Core, MD   5 mg at 09/19/22 2110   fluticasone  furoate-vilanterol (BREO ELLIPTA) 200-25 MCG/ACT 1 puff  1 puff Inhalation Daily Tanda Rockers A, DO   1 puff at 09/20/22 0753   haloperidol (HALDOL) tablet 2 mg  2 mg Oral Q8H PRN Eber Hong, MD   2 mg at 09/20/22 0323   hydrochlorothiazide (HYDRODIURIL) tablet 25 mg  25 mg Oral Daily Tanda Rockers A, DO   25 mg at 09/20/22 1036   levothyroxine (SYNTHROID) tablet 50 mcg  50 mcg Oral Q0600 Benjiman Core, MD   50 mcg at 09/20/22 0612   montelukast (SINGULAIR) tablet 10 mg  10 mg Oral QHS Tanda Rockers A, DO    10 mg at 09/19/22 2110   umeclidinium bromide (INCRUSE ELLIPTA) 62.5 MCG/ACT 1 puff  1 puff Inhalation Daily Tanda Rockers A, DO   1 puff at 09/20/22 5784   Current Outpatient Medications  Medication Sig Dispense Refill   albuterol (PROVENTIL) (2.5 MG/3ML) 0.083% nebulizer solution INHALE 3 ML BY NEBULIZATION EVERY 6 HOURS AS NEEDED FOR WHEEZING OR SHORTNESS OF BREATH 300 mL 3   albuterol (VENTOLIN HFA) 108 (90 Base) MCG/ACT inhaler Inhale 2 puffs into the lungs every 6 (six) hours as needed for wheezing or shortness of breath. 8 g 6   budesonide-formoterol (SYMBICORT) 160-4.5 MCG/ACT inhaler Inhale 2 puffs into the lungs 2 (two) times daily. 6 g 0   diclofenac (VOLTAREN) 75 MG EC tablet TAKE 1 TABLET BY MOUTH TWICE A DAY 60 tablet 1   donepezil (ARICEPT) 5 MG tablet Take 5 mg by mouth at bedtime.     hydrochlorothiazide (HYDRODIURIL) 25 MG tablet Take 1 tablet (25 mg total) by mouth daily. 90 tablet 3   montelukast (SINGULAIR) 10 MG tablet TAKE 1 TABLET BY MOUTH EVERYDAY AT BEDTIME 90 tablet 0   Tiotropium Bromide Monohydrate (SPIRIVA RESPIMAT) 2.5 MCG/ACT AERS Inhale 2 puffs into the lungs daily. 4 g 11   levothyroxine (SYNTHROID) 50 MCG tablet TAKE 1 TABLET BY MOUTH EVERY DAY BEFORE BREAKFAST 90 tablet 1     Discharge Medications: Please see discharge summary for a list of discharge medications.  Relevant Imaging Results:  Relevant Lab Results:   Additional Information SSN: [id number] [place]

## 2022-09-20 NOTE — ED Notes (Signed)
Pt upset, requesting medication for agitation/anxiety.

## 2022-09-20 NOTE — ED Notes (Signed)
CSW continues to work on [employer] for pt, requested documents uploaded. CSW spoke with pts daughter and requested Medicaid application be completed for pt as all facilities will require this for placement. CSW spoke to Lindsay with APS who states she will also speak with pts daughter to assist with application as able and confirm it gets completed. TOC to follow.

## 2022-09-20 NOTE — ED Notes (Signed)
Pt calling out stating her feet are cold and sore. Pt given blankets and tylenol requested from MD.

## 2022-09-20 NOTE — ED Notes (Addendum)
Transition of Care (TOC) Dementia Primary        Transition of Care [facility]) CM/SW Contact  Name: Villa Herb  Phone Number: [phone]         To Whom it May Concern:   Please be advised that the above patient has a primary diagnosis of dementia which supersedes any psychiatric diagnosis.    Pricilla Loveless, MD 09/20/22 1110

## 2022-09-20 NOTE — ED Notes (Signed)
RE: Mary Blevins Date Of Birth: 31-Oct-1945 Date: 09/20/2022  MUST ID: [id number]  To Whom It May Concern:   Please be advised that the above name patient will require a short-term nursing home stay - anticipated 30 days or less rehabilitation and strengthening. The plan is for return home.    Pricilla Loveless, MD 09/20/22 [phone]

## 2022-09-20 NOTE — ED Notes (Signed)
Pt sitting up at the bedside visiting with family. Pt in a pleasant mood.

## 2022-09-20 NOTE — ED Provider Notes (Signed)
Emergency Medicine Observation Re-evaluation Note  Mary Blevins is a 77 y.o. female, seen on rounds today.  Pt initially presented to the ED for complaints of Failure To Thrive Currently, the patient is feeling depressed. Is resting.  Complains of cold feet bilateral  Physical Exam  BP (!) 125/93   Pulse 86   Temp 98.4 F (36.9 C) (Oral)   Resp 19   Ht 5' 5.5" (1.664 m)   Wt 63 kg   SpO2 97%   BMI 22.78 kg/m  Physical Exam General: no acute distress Cardiac: 2+ DP pulses bilaterally Lungs: normal effort Psych: depressed  ED Course / MDM  EKG:EKG Interpretation Date/Time:  Friday September 15 2022 15:45:38 EDT Ventricular Rate:  81 PR Interval:  80 QRS Duration:  89 QT Interval:  437 QTC Calculation: 508 R Axis:   65  Text Interpretation: Ectopic atrial rhythm Short PR interval Anteroseptal infarct, old Nonspecific repol abnormality, diffuse leads Confirmed by Zadie Rhine ([id number]) on 09/16/2022 12:09:33 AM  I have reviewed the labs performed to date as well as medications administered while in observation.  No recent changes in the last 24 hours.  Plan  Current plan is for placement.    Pricilla Loveless, MD 09/20/22 [phone]

## 2022-09-21 ENCOUNTER — Ambulatory Visit: Payer: Self-pay | Admitting: *Deleted

## 2022-09-21 ENCOUNTER — Encounter: Payer: Self-pay | Admitting: *Deleted

## 2022-09-21 MED ORDER — FLUOXETINE HCL 20 MG PO CAPS
20.0000 mg | ORAL_CAPSULE | Freq: Every day | ORAL | Status: DC
  Administered 2022-09-21 – 2022-09-25 (×5): 20 mg via ORAL
  Filled 2022-09-21 (×6): qty 1
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
## 2022-09-21 NOTE — BH Assessment (Signed)
Doran Heater, FNP updated TTS and informed TTS that after speaking with EDP a medication consult was not needed. Consult cancelled.

## 2022-09-21 NOTE — ED Notes (Signed)
Helped pt to bedside commode. Pt states she's feeling depressed and would like to speak with the doctor. Cleaned bedside commode and informed pt that I'd let the doctor know

## 2022-09-21 NOTE — BH Assessment (Addendum)
This clinician spoke with Pt's RN to get clarification on new TTS consult. Per RN, pt is not SI or HI but EDP is seeking medications consult. Doran Heater, NP notified and will follow up with psych consult.

## 2022-09-21 NOTE — ED Notes (Signed)
CSW spoke to [place] with APS who states that she will need sign authorized rep form signed by pt and emailed back to her before she can begin working on a Medicaid application for pt. CSW met with pt at bedside to get form signed. Signed form sent back to [facility] via email. APS to start Medicaid application. TOC to follow.

## 2022-09-21 NOTE — ED Provider Notes (Addendum)
Emergency Medicine Observation Re-evaluation Note  Mary Blevins is a 77 y.o. female, seen on rounds today.  Pt initially presented to the ED for complaints of dementia with behavioral symptoms. Pt currently calm, resting.   Physical Exam  BP (!) 125/91 (BP Location: Right Arm)   Pulse 76   Temp (!) 97.5 F (36.4 C)   Resp 15   Ht 1.664 m (5' 5.5")   Wt 63 kg   SpO2 95%   BMI 22.78 kg/m  Physical Exam General: resting.  Cardiac: regular rate.  Lungs: breathing comfortably. Psych: calm.   ED Course / MDM    I have reviewed the labs performed to date as well as medications administered while in observation.  Recent changes in the last 24 hours include ED obs, reassessment.   Plan  Current plan is for SNF placement. Dispo per [facility] team.     Cathren Laine, MD 09/21/22 [phone]  Discussed pt with [facility] team, Doran Heater - she recommends starting patient on prozac 20 mg a day - ordered.      Cathren Laine, MD 09/21/22 [phone]

## 2022-09-22 NOTE — ED Provider Notes (Signed)
Emergency Medicine Observation Re-evaluation Note  Mary Blevins is a 77 y.o. female, seen on rounds today.  Pt initially presented to the ED for complaints of Failure To Thrive Currently, the patient has no new complaints.  Physical Exam  BP 102/76 (BP Location: Left Arm)   Pulse 94   Temp (!) 97.5 F (36.4 C) (Oral)   Resp 17   Ht 5' 5.5" (1.664 m)   Wt 63 kg   SpO2 99%   BMI 22.78 kg/m  Physical Exam General: Resting comfortably in stretcher Lungs: Normal work of breathing Psych: Calm  ED Course / MDM  EKG:EKG Interpretation Date/Time:  Friday September 15 2022 15:45:38 EDT Ventricular Rate:  81 PR Interval:  80 QRS Duration:  89 QT Interval:  437 QTC Calculation: 508 R Axis:   65  Text Interpretation: Ectopic atrial rhythm Short PR interval Anteroseptal infarct, old Nonspecific repol abnormality, diffuse leads Confirmed by Zadie Rhine ([id number]) on 09/16/2022 12:09:33 AM  I have reviewed the labs performed to date as well as medications administered while in observation.  Recent changes in the last 24 hours include no acute events.  Still searching for placement.  Plan  Current plan is for placement.    Rondel Baton, MD 09/22/22 [phone]

## 2022-09-22 NOTE — Progress Notes (Signed)
Physical Therapy Treatment Patient Details Name: Mary Blevins MRN: [id number] DOB: 1945-05-11 Today's Date: 09/22/2022   History of Present Illness Mary Blevins is a 77 y.o. female.     HPI  Patient brought in by family member.  Reportedly has had dementia and worsening depression.  Has been eating less.  Reportedly is that she is not suicidal but reportedly sort of shrugs when she says it.  Decreased appetite.  Per the family ember patient has been lighting candles around the house and forgetting about them.  They have been attempting to get her into a nursing home, however reportedly cannot get it paid for.    PT Comments  Patient agreeable for therapy.  Patient demonstrates slightly labored movement for sitting up at bedside, fair/good return for completing BLE ROM/strengthening exercises while seated at bedside and slightly increased endurance/distance for gait training requiring occasional standing rest breaks due to fatigue before returning back to room.  Patient put back to bed after therapy.  Patient will benefit from continued skilled physical therapy in hospital and recommended venue below to increase strength, balance, endurance for safe ADLs and gait.      If plan is discharge home, recommend the following: A little help with walking and/or transfers;A little help with bathing/dressing/bathroom;Assistance with cooking/housework;Help with stairs or ramp for entrance   Can travel by private vehicle     Yes  Equipment Recommendations  None recommended by PT    Recommendations for Other Services       Precautions / Restrictions Precautions Precautions: Fall Restrictions Weight Bearing Restrictions: No     Mobility  Bed Mobility                    Transfers Overall transfer level: Needs assistance Equipment used: Rolling walker (2 wheels) Transfers: Sit to/from Stand, Bed to chair/wheelchair/BSC Sit to Stand: Min assist   Step pivot transfers: Min  assist       General transfer comment: slow labored movement    Ambulation/Gait Ambulation/Gait assistance: Min assist Gait Distance (Feet): 55 Feet Assistive device: Rolling walker (2 wheels) Gait Pattern/deviations: Decreased step length - right, Decreased step length - left, Decreased stride length, Trunk flexed Gait velocity: decreased     General Gait Details: slightly increased endurance/distance with slow labored cadence, no loss of balance, limited mostly due to fatigue   Stairs             Wheelchair Mobility     Tilt Bed    Modified Rankin (Stroke Patients Only)       Balance Overall balance assessment: Needs assistance Sitting-balance support: Feet unsupported, No upper extremity supported Sitting balance-Leahy Scale: Fair Sitting balance - Comments: fair/good seated at EOB   Standing balance support: Reliant on assistive device for balance, During functional activity, Bilateral upper extremity supported Standing balance-Leahy Scale: Poor Standing balance comment: fair/poor using RW                            Cognition Arousal: Alert Behavior During Therapy: WFL for tasks assessed/performed, Lability Overall Cognitive Status: History of cognitive impairments - at baseline                                          Exercises General Exercises - Lower Extremity Long Arc Quad: 10 reps, Both, Strengthening, AROM, Seated  Hip Flexion/Marching: Seated, 10 reps, Both, Strengthening, AROM Toe Raises: Seated, 10 reps, Both, Strengthening, AROM Heel Raises: Seated, 10 reps, Both, Strengthening, AROM    General Comments        Pertinent Vitals/Pain      Home Living                          Prior Function            PT Goals (current goals can now be found in the care plan section) Acute Rehab PT Goals Patient Stated Goal: return home after rehab PT Goal Formulation: With patient Time For Goal  Achievement: 10/03/22 Potential to Achieve Goals: Good Progress towards PT goals: Progressing toward goals    Frequency    Min 2X/week      PT Plan      Co-evaluation              AM-PAC PT "6 Clicks" Mobility   Outcome Measure  Help needed turning from your back to your side while in a flat bed without using bedrails?: A Little Help needed moving from lying on your back to sitting on the side of a flat bed without using bedrails?: A Little Help needed moving to and from a bed to a chair (including a wheelchair)?: A Little Help needed standing up from a chair using your arms (e.g., wheelchair or bedside chair)?: A Little Help needed to walk in hospital room?: A Lot Help needed climbing 3-5 steps with a railing? : A Lot 6 Click Score: 16    End of Session   Activity Tolerance: Patient tolerated treatment well;Patient limited by fatigue Patient left: in bed;with call bell/phone within reach Nurse Communication: Mobility status PT Visit Diagnosis: Unsteadiness on feet (R26.81);Other abnormalities of gait and mobility (R26.89);Muscle weakness (generalized) (M62.81)     Time: 9528-4132 PT Time Calculation (min) (ACUTE ONLY): 20 min  Charges:    $Gait Training: 8-22 mins $Therapeutic Exercise: 8-22 mins PT General Charges $$ ACUTE PT VISIT: 1 Visit                     4:21 PM, 09/22/22 Mary Blevins, MPT Physical Therapist with [facility] [phone] office [phone] mobile phone

## 2022-09-22 NOTE — ED Notes (Signed)
Pt given sprite per request 

## 2022-09-22 NOTE — ED Notes (Signed)
Medicaid application has been completed by [facility] with DSS. CSW provided Revonda Standard with Lewayne Bunting the Medicaid worker info. CSW started insurance auth for SNF at this time. CSW requested that PT work with pt again when able. TOC to follow.

## 2022-09-23 NOTE — ED Notes (Signed)
Pt up to use bedside commode with standby assist and back to bed after, comfortable and denies needs.

## 2022-09-23 NOTE — ED Notes (Signed)
NT offered bath and hygiene. Pt refused

## 2022-09-23 NOTE — ED Notes (Signed)
Pt asked to get up and walk around. Pt and nurse did a lap around the nurses station with no issues. Pt ambulated well with cane.

## 2022-09-23 NOTE — ED Provider Notes (Signed)
Emergency Medicine Observation Re-evaluation Note  Mary Blevins is a 76 y.o. female, seen on rounds today.  Pt initially presented to the ED for complaints of Failure To Thrive Currently, the patient is awaiting placement to nursing home.Marland Kitchen  Physical Exam  BP 131/89 (BP Location: Right Arm)   Pulse 89   Temp 97.8 F (36.6 C) (Oral)   Resp 14   Ht 5' 5.5" (1.664 m)   Wt 63 kg   SpO2 99%   BMI 22.78 kg/m  Physical Exam Alert and in no acute distress  ED Course / MDM  EKG:EKG Interpretation Date/Time:  Friday September 15 2022 15:45:38 EDT Ventricular Rate:  81 PR Interval:  80 QRS Duration:  89 QT Interval:  437 QTC Calculation: 508 R Axis:   65  Text Interpretation: Ectopic atrial rhythm Short PR interval Anteroseptal infarct, old Nonspecific repol abnormality, diffuse leads Confirmed by Zadie Rhine ([id number]) on 09/16/2022 12:09:33 AM  I have reviewed the labs performed to date as well as medications administered while in observation.  Recent changes in the last 24 hours include none.  Plan  Current plan is for nursing home placement.    Bethann Berkshire, MD 09/23/22 [phone]

## 2022-09-24 MED ORDER — ONDANSETRON 8 MG PO TBDP
8.0000 mg | ORAL_TABLET | Freq: Three times a day (TID) | ORAL | Status: DC | PRN
  Administered 2022-09-24 – 2022-09-26 (×4): 8 mg via ORAL
  Filled 2022-09-24 (×4): qty 1

## 2022-09-24 NOTE — ED Notes (Signed)
Pt assisted up to use restroom when calling out, standby assist given.

## 2022-09-24 NOTE — ED Notes (Addendum)
Pt consistently refusing bath from staff, multiple staff members have attempted. Family questioning why pt has not be bathed, was informed that the pt has been refusing, and staff cannot force pt. Informed family that on 09/23/22 this tech did get pt to be agreeable to change clothes and allow peri care but that she refused a full bath. Today, 09/24/22 pt family is back and questioning staff why she is not having a bath again, another staff member also informed family that pt has been refusing and if the pt does refuse, staff cannot make her do it. Family questioning what the protocols are to force pt to have a bath, staff informed that if the pt is alert and able to say she does not want a bath, we cannot do anything to "make her do it" Family was informed that if they wanted to attempt to talk to pt and get her agreeable to take a bath with staff we would do so, if family would be open to bathing pt as well that staff would provide supplies needed. Family is bathing pt at this time.

## 2022-09-24 NOTE — ED Provider Notes (Signed)
Emergency Medicine Observation Re-evaluation Note  Mary Blevins is a 77 y.o. female, seen on rounds today.  Pt initially presented to the ED for complaints of Failure To Thrive Currently, the patient is awaiting nursing home placement.  Physical Exam  BP (!) 125/91   Pulse 81   Temp 97.8 F (36.6 C) (Oral)   Resp 16   Ht 5' 5.5" (1.664 m)   Wt 63 kg   SpO2 97%   BMI 22.78 kg/m  Physical Exam Alert and in no acute distress  ED Course / MDM  EKG:EKG Interpretation Date/Time:  Friday September 15 2022 15:45:38 EDT Ventricular Rate:  81 PR Interval:  80 QRS Duration:  89 QT Interval:  437 QTC Calculation: 508 R Axis:   65  Text Interpretation: Ectopic atrial rhythm Short PR interval Anteroseptal infarct, old Nonspecific repol abnormality, diffuse leads Confirmed by Zadie Rhine ([id number]) on 09/16/2022 12:09:33 AM  I have reviewed the labs performed to date as well as medications administered while in observation.  Recent changes in the last 24 hours include none.  Plan  Current plan is for nursing home placement.    Bethann Berkshire, MD 09/24/22 [phone]

## 2022-09-25 NOTE — ED Notes (Signed)
Encouraged pt to eat again but pt refuses.

## 2022-09-25 NOTE — ED Provider Notes (Signed)
Emergency Medicine Observation Re-evaluation Note  Mary Blevins is a 77 y.o. female, seen on rounds today.  Pt initially presented to the ED for complaints of Failure To Thrive Currently, the patient is awaiting nursing home placement.  Physical Exam  BP (!) 125/91   Pulse 81   Temp 97.8 F (36.6 C) (Oral)   Resp 16   Ht 5' 5.5" (1.664 m)   Wt 63 kg   SpO2 97%   BMI 22.78 kg/m  Physical Exam Alert and in no acute distress  ED Course / MDM  EKG:EKG Interpretation Date/Time:  Friday September 15 2022 15:45:38 EDT Ventricular Rate:  81 PR Interval:  80 QRS Duration:  89 QT Interval:  437 QTC Calculation: 508 R Axis:   65  Text Interpretation: Ectopic atrial rhythm Short PR interval Anteroseptal infarct, old Nonspecific repol abnormality, diffuse leads Confirmed by Zadie Rhine ([id number]) on 09/16/2022 12:09:33 AM  I have reviewed the labs performed to date as well as medications administered while in observation.  Recent changes in the last 24 hours include none.  Plan  Current plan is for nursing home placement.    Bethann Berkshire, MD 09/25/22 [phone]

## 2022-09-25 NOTE — ED Notes (Signed)
This nurse offered to set up patients meal tray for her to eat breakfast. Pt refuses breakfast tray at this time. States she is not hungry. No other concerns noted at this time.

## 2022-09-25 NOTE — ED Notes (Signed)
Patient assisted to bedside commode at this time. Patient assisted back to bed. Patient unsteady on her feet. Patient stated that she felt short of breath and dizzy. Asked tech to check patient's vitals.

## 2022-09-25 NOTE — ED Notes (Signed)
Pt refuses meal tray. This nurse encouraged pt to eat since she has not had anything to eat yet today. Pt refuses at this time. Pt nephew is at bedside. This nurse asked him to encouraged the pt as well.

## 2022-09-26 DIAGNOSIS — I700 Atherosclerosis of aorta: Secondary | ICD-10-CM | POA: Diagnosis not present

## 2022-09-26 DIAGNOSIS — F331 Major depressive disorder, recurrent, moderate: Secondary | ICD-10-CM | POA: Diagnosis not present

## 2022-09-26 DIAGNOSIS — F03918 Unspecified dementia, unspecified severity, with other behavioral disturbance: Secondary | ICD-10-CM | POA: Diagnosis not present

## 2022-09-26 DIAGNOSIS — J449 Chronic obstructive pulmonary disease, unspecified: Secondary | ICD-10-CM | POA: Diagnosis not present

## 2022-09-26 DIAGNOSIS — E039 Hypothyroidism, unspecified: Secondary | ICD-10-CM | POA: Diagnosis not present

## 2022-09-26 DIAGNOSIS — M5459 Other low back pain: Secondary | ICD-10-CM | POA: Diagnosis not present

## 2022-09-26 DIAGNOSIS — I10 Essential (primary) hypertension: Secondary | ICD-10-CM | POA: Diagnosis not present

## 2022-09-26 DIAGNOSIS — E038 Other specified hypothyroidism: Secondary | ICD-10-CM | POA: Diagnosis not present

## 2022-09-26 DIAGNOSIS — E7849 Other hyperlipidemia: Secondary | ICD-10-CM | POA: Diagnosis not present

## 2022-09-26 DIAGNOSIS — F32A Depression, unspecified: Secondary | ICD-10-CM | POA: Diagnosis not present

## 2022-09-26 DIAGNOSIS — F411 Generalized anxiety disorder: Secondary | ICD-10-CM | POA: Diagnosis not present

## 2022-09-26 DIAGNOSIS — F03C18 Unspecified dementia, severe, with other behavioral disturbance: Secondary | ICD-10-CM | POA: Diagnosis not present

## 2022-09-26 DIAGNOSIS — R4182 Altered mental status, unspecified: Secondary | ICD-10-CM | POA: Diagnosis not present

## 2022-09-26 DIAGNOSIS — R5381 Other malaise: Secondary | ICD-10-CM | POA: Diagnosis not present

## 2022-09-26 DIAGNOSIS — F0390 Unspecified dementia without behavioral disturbance: Secondary | ICD-10-CM | POA: Diagnosis not present

## 2022-09-26 MED ORDER — FLUOXETINE HCL 20 MG PO TABS: 20.0000 mg | ORAL_TABLET | Freq: Every day | ORAL | 1 refills | Status: AC

## 2022-09-26 NOTE — [place] FL2 (Signed)
[place] MEDICAID FL2 LEVEL OF CARE FORM     IDENTIFICATION  Patient Name: Mary Blevins Birthdate: [birth year] Sex: female Admission Date (Current Location): 09/15/2022  [facility] and [place] Number:  [employer] and Address:  [facility],  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   Unilateral primary osteoarthritis, right hip 09/23/2020   Leg cramping 01/07/2020   GAD (generalized anxiety disorder) 12/20/2016   Chronic insomnia 12/20/2016   Aortic atherosclerosis (HCC) 06/09/2016   Benign neoplasm of ascending colon    Benign neoplasm of transverse colon    Benign neoplasm of sigmoid colon    Diverticulosis of sigmoid colon    Grade I internal hemorrhoids    PSVT (paroxysmal supraventricular tachycardia) 02/17/2015   Glaucoma suspect of both eyes 05/30/2013   Nuclear sclerosis 05/30/2013   Back pain 07/22/2012   Acute pain of left hip 07/22/2012   Stricture and stenosis of esophagus 09/18/2011   Hx of colonic polyps 09/18/2011   COPD (chronic obstructive pulmonary disease) (HCC) 07/14/2010   Generalized headaches 07/14/2010   Osteopenia 07/14/2010   Hypothyroid 07/14/2010   Elevated lipids 07/14/2010    Orientation RESPIRATION BLADDER Height & Weight     Self, Time, Situation, Place  Normal Continent Weight: 139 lb (63 kg) Height:  5' 5.5"  (166.4 cm)  BEHAVIORAL SYMPTOMS/MOOD NEUROLOGICAL BOWEL NUTRITION STATUS      Continent Diet (regular)  AMBULATORY STATUS COMMUNICATION OF NEEDS Skin   Extensive Assist Verbally Normal                       Personal Care Assistance Level of Assistance  Bathing, Feeding, Dressing Bathing Assistance: Limited assistance Feeding assistance: Independent Dressing Assistance: Limited assistance     Functional Limitations Info  Sight, Hearing, Speech Sight Info: Adequate Hearing Info: Adequate Speech Info: Adequate    SPECIAL CARE FACTORS FREQUENCY  PT (By licensed PT), OT (By licensed OT)     PT Frequency: 5x week OT Frequency: 5x week            Contractures Contractures Info: Not present    Additional Factors Info  Code Status, Allergies, Psychotropic Code Status Info: Full Allergies Info: Oxycodone, Meloxicam Psychotropic Info: Cymbalta         Current Medications (09/26/2022):  This is the current hospital active medication list Current Facility-Administered Medications  Medication Dose Route Frequency Provider Last Rate Last Admin   acetaminophen (TYLENOL) tablet 650 mg  650 mg Oral Q4H PRN Pricilla Loveless, MD   650 mg at 09/21/22 2212   albuterol (VENTOLIN HFA) 108 (90 Base) MCG/ACT inhaler 2 puff  2 puff Inhalation Q6H PRN Benjiman Core, MD       donepezil (ARICEPT) tablet 5 mg  5 mg Oral QHS Benjiman Core, MD   5 mg at 09/25/22 2119   FLUoxetine (  PROZAC) capsule 20 mg  20 mg Oral Daily Cathren Laine, MD   20 mg at 09/25/22 0903   fluticasone furoate-vilanterol (BREO ELLIPTA) 200-25 MCG/ACT 1 puff  1 puff Inhalation Daily Tanda Rockers A, DO   1 puff at 09/26/22 0755   haloperidol (HALDOL) tablet 2 mg  2 mg Oral Q8H PRN Eber Hong, MD   2 mg at 09/23/22 1013   hydrochlorothiazide (HYDRODIURIL) tablet 25 mg  25 mg Oral Daily Tanda Rockers A, DO   25 mg at 09/25/22 1610   levothyroxine (SYNTHROID) tablet 50 mcg  50 mcg Oral Q0600 Benjiman Core, MD    50 mcg at 09/26/22 0743   montelukast (SINGULAIR) tablet 10 mg  10 mg Oral QHS Tanda Rockers A, DO   10 mg at 09/25/22 2119   ondansetron (ZOFRAN-ODT) disintegrating tablet 8 mg  8 mg Oral Q8H PRN Dione Booze, MD   8 mg at 09/26/22 9604   umeclidinium bromide (INCRUSE ELLIPTA) 62.5 MCG/ACT 1 puff  1 puff Inhalation Daily Tanda Rockers A, DO   1 puff at 09/25/22 5409   Current Outpatient Medications  Medication Sig Dispense Refill   albuterol (PROVENTIL) (2.5 MG/3ML) 0.083% nebulizer solution INHALE 3 ML BY NEBULIZATION EVERY 6 HOURS AS NEEDED FOR WHEEZING OR SHORTNESS OF BREATH 300 mL 3   albuterol (VENTOLIN HFA) 108 (90 Base) MCG/ACT inhaler Inhale 2 puffs into the lungs every 6 (six) hours as needed for wheezing or shortness of breath. 8 g 6   budesonide-formoterol (SYMBICORT) 160-4.5 MCG/ACT inhaler Inhale 2 puffs into the lungs 2 (two) times daily. 6 g 0   diclofenac (VOLTAREN) 75 MG EC tablet TAKE 1 TABLET BY MOUTH TWICE A DAY 60 tablet 1   donepezil (ARICEPT) 5 MG tablet Take 5 mg by mouth at bedtime.     hydrochlorothiazide (HYDRODIURIL) 25 MG tablet Take 1 tablet (25 mg total) by mouth daily. 90 tablet 3   montelukast (SINGULAIR) 10 MG tablet TAKE 1 TABLET BY MOUTH EVERYDAY AT BEDTIME 90 tablet 0   Tiotropium Bromide Monohydrate (SPIRIVA RESPIMAT) 2.5 MCG/ACT AERS Inhale 2 puffs into the lungs daily. 4 g 11   levothyroxine (SYNTHROID) 50 MCG tablet TAKE 1 TABLET BY MOUTH EVERY DAY BEFORE BREAKFAST 90 tablet 1     Discharge Medications: Please see after visit summary for a list of discharge medications.  Relevant Imaging Results:  Relevant Lab Results:   Additional Information SSN: [id number] [place]

## 2022-09-26 NOTE — ED Notes (Signed)
CSW confirmed pts insurance [employer] has been approved for SNF at this time. CSW spoke with pt in room to provide update. Pt is agreeable to [facility]. CSW updated Revonda Standard who states they can accept pt today. CSW completed updated Fl2 and MD signed. MD aware and placed pt for D/C. CSW provided RN with room and report numbers. CSW updated Brandi with APS and pts daughter of plan for D/C today. CSW updated RN that Diplomatic Services operational officer can call for [place] or EMS when RN has called report. TOC signing off.

## 2022-09-26 NOTE — ED Provider Notes (Signed)
Emergency Medicine Observation Re-evaluation Note  Mary Blevins is a 77 y.o. female, seen on rounds today.  Pt initially presented to the ED for complaints of Failure To Thrive Currently, the patient is resting. Feels depressed.  Physical Exam  BP 120/85 (BP Location: Left Arm)   Pulse 82   Temp 97.6 F (36.4 C) (Oral)   Resp 18   Ht 5' 5.5" (1.664 m)   Wt 63 kg   SpO2 95%   BMI 22.78 kg/m  Physical Exam General: no acute distress Lungs: normal effort Psych: depressed  ED Course / MDM  EKG:EKG Interpretation Date/Time:  Friday September 15 2022 15:45:38 EDT Ventricular Rate:  81 PR Interval:  80 QRS Duration:  89 QT Interval:  437 QTC Calculation: 508 R Axis:   65  Text Interpretation: Ectopic atrial rhythm Short PR interval Anteroseptal infarct, old Nonspecific repol abnormality, diffuse leads Confirmed by Zadie Rhine ([id number]) on 09/16/2022 12:09:33 AM  I have reviewed the labs performed to date as well as medications administered while in observation.  No recent changes in the last 24 hours.  Plan  Current plan is for discharge to [facility]. Will d/c with prozac as she was started on this in the ED for her depression.    Pricilla Loveless, MD 09/26/22 0900

## 2022-09-26 NOTE — ED Notes (Signed)
Pt assisted up to bsc to void and back to bed.

## 2022-09-26 NOTE — Discharge Instructions (Addendum)
We are starting you on a new antidepression medicine called Prozac.  You were given a dose today, start the prescription tomorrow.  Otherwise follow-up with your primary care physician.  Return for any new or worsening or concerning symptoms.

## 2022-09-28 DIAGNOSIS — R4182 Altered mental status, unspecified: Secondary | ICD-10-CM | POA: Diagnosis not present

## 2022-09-28 DIAGNOSIS — F0390 Unspecified dementia without behavioral disturbance: Secondary | ICD-10-CM | POA: Diagnosis not present

## 2022-09-28 DIAGNOSIS — R5381 Other malaise: Secondary | ICD-10-CM | POA: Diagnosis not present

## 2022-09-29 NOTE — Congregational Nurse Program (Unsigned)
Today, I had a conversation with Ms. Hanauer's granddaughter. In addition to living in unsafe conditions and refusing to leave, she expressed her concerns about her grandmother's declining health, referring to her as "[name]." She mentioned that her grandmother's behavior has become increasingly unusual. For instance, she recalled an incident where her grandmother came out of the house wearing just one shoe, a coat, a bra, and no blouse or pants. Additionally, she mentioned that her grandmother was resistant when asked to take a shower. The family is keen on ensuring that Ms. Courtade receives the best possible care. However, they are unsure what to do at this point.   Salem Senate RN MSN DNP Assistant Coordinator Congregational Nursing Program [place] Cell [phone]

## 2022-10-05 ENCOUNTER — Ambulatory Visit: Payer: Self-pay | Admitting: *Deleted

## 2022-10-05 ENCOUNTER — Encounter: Payer: Self-pay | Admitting: *Deleted
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
## 2022-10-12 DIAGNOSIS — R0981 Nasal congestion: Secondary | ICD-10-CM | POA: Diagnosis not present

## 2022-10-24 ENCOUNTER — Ambulatory Visit: Payer: Self-pay | Admitting: *Deleted
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
## 2022-10-25 DIAGNOSIS — M19031 Primary osteoarthritis, right wrist: Secondary | ICD-10-CM | POA: Diagnosis not present

## 2022-10-30 DIAGNOSIS — E039 Hypothyroidism, unspecified: Secondary | ICD-10-CM | POA: Diagnosis not present

## 2022-10-30 DIAGNOSIS — F0390 Unspecified dementia without behavioral disturbance: Secondary | ICD-10-CM | POA: Diagnosis not present

## 2022-10-30 DIAGNOSIS — J449 Chronic obstructive pulmonary disease, unspecified: Secondary | ICD-10-CM | POA: Diagnosis not present

## 2022-11-01 DIAGNOSIS — F32A Depression, unspecified: Secondary | ICD-10-CM | POA: Diagnosis not present

## 2022-11-13 DIAGNOSIS — G4700 Insomnia, unspecified: Secondary | ICD-10-CM | POA: Diagnosis not present

## 2022-11-13 DIAGNOSIS — H04123 Dry eye syndrome of bilateral lacrimal glands: Secondary | ICD-10-CM | POA: Diagnosis not present

## 2022-11-20 DIAGNOSIS — M79641 Pain in right hand: Secondary | ICD-10-CM | POA: Diagnosis not present

## 2022-11-20 DIAGNOSIS — E039 Hypothyroidism, unspecified: Secondary | ICD-10-CM | POA: Diagnosis not present

## 2022-11-20 DIAGNOSIS — Z79899 Other long term (current) drug therapy: Secondary | ICD-10-CM | POA: Diagnosis not present

## 2022-11-20 DIAGNOSIS — M79642 Pain in left hand: Secondary | ICD-10-CM | POA: Diagnosis not present

## 2022-11-21 DIAGNOSIS — M6281 Muscle weakness (generalized): Secondary | ICD-10-CM | POA: Diagnosis not present

## 2022-11-21 DIAGNOSIS — D122 Benign neoplasm of ascending colon: Secondary | ICD-10-CM | POA: Diagnosis not present

## 2022-11-21 DIAGNOSIS — F0390 Unspecified dementia without behavioral disturbance: Secondary | ICD-10-CM | POA: Diagnosis not present

## 2022-11-21 DIAGNOSIS — E039 Hypothyroidism, unspecified: Secondary | ICD-10-CM | POA: Diagnosis not present

## 2022-11-22 DIAGNOSIS — R5381 Other malaise: Secondary | ICD-10-CM | POA: Diagnosis not present

## 2022-11-22 DIAGNOSIS — E039 Hypothyroidism, unspecified: Secondary | ICD-10-CM | POA: Diagnosis not present

## 2022-11-22 DIAGNOSIS — F0390 Unspecified dementia without behavioral disturbance: Secondary | ICD-10-CM | POA: Diagnosis not present

## 2022-11-22 DIAGNOSIS — J449 Chronic obstructive pulmonary disease, unspecified: Secondary | ICD-10-CM | POA: Diagnosis not present

## 2022-11-23 ENCOUNTER — Ambulatory Visit: Payer: Self-pay | Admitting: *Deleted

## 2022-11-23 NOTE — Patient Outreach (Signed)
Care Coordination   Follow Up Visit Note   11/23/2022  Name: Mary Blevins MRN: [id number] DOB: 24-Feb-1945  Ronney Lion is a 77 y.o. year old female who sees Pickard, Priscille Heidelberg, MD for primary care. I spoke with patient's daughter, Mary Blevins by phone today.  What matters to the patients health and wellness today?  Receive Counseling & Supportive Services.    Goals Addressed               This Visit's Progress     Receive Counseling & Supportive Services. (pt-stated)   On track     Care Coordination Interventions:  Interventions Today    Flowsheet Row Most Recent Value  Chronic Disease   Chronic disease during today's visit Chronic Obstructive Pulmonary Disease (COPD), Other  [Generalized Anxiety Disorder, Chronic Back Pain, Grief & Loss, Altered Mental Status, Inability to Perform Activities of Daily Living Independently, Chronic Insomnia & Depression]  General Interventions   General Interventions Discussed/Reviewed General Interventions Discussed, Labs, Vaccines, Doctor Visits, Health Screening, Annual Foot Exam, General Interventions Reviewed, Lipid Profile, Annual Eye Exam, Durable Medical Equipment (DME), [employer], Communication with, Level of Care  [Comunication with Care Team Members]  Labs Hgb A1c every 3 months, Kidney Function  [Encouraged]  Vaccines COVID-19, Flu, Pneumonia, RSV, Shingles, Tetanus/Pertussis/Diphtheria  [Encouraged]  Doctor Visits Discussed/Reviewed Doctor Visits Discussed, Specialist, Doctor Visits Reviewed, Annual Wellness Visits, PCP  [Encouraged]  Health Screening Bone Density, Colonoscopy, Mammogram  [Encouraged]  Durable Medical Equipment (DME) BP Cuff, Bed side commode, Environmental consultant, Wheelchair, Tour manager, Other  [Prescription Glasses, Hospital Bed]  Wheelchair Standard  PCP/Specialist Visits Compliance with follow-up visit  Communication with PCP/Specialists, RN  Level of Care Skilled Nursing Environmental manager Applying for Special Assistance Long-Term Care Medicaid]  Exercise Interventions   Exercise Discussed/Reviewed Exercise Discussed, Assistive device use and maintanence, Exercise Reviewed, Physical Activity, Weight Managment  [Encouraged]  Physical Activity Discussed/Reviewed Physical Activity Discussed, Home Exercise Program (HEP), Physical Activity Reviewed, PREP, Gym, Types of exercise  [Encouraged]  Weight Management Weight maintenance  [Encouraged]  Education Interventions   Education Provided Provided Education  Provided Verbal Education On Nutrition, Mental Health/Coping with Illness, When to see the doctor, Foot Care, Eye Care, Labs, Applications, [employer], Exercise, Medication, Insurance Plans  Labs Reviewed Hgb A1c  Applications Medicaid  [Assistance Applying for Special Assistance Long-Term Care Medicaid]  Mental Health Interventions   Mental Health Discussed/Reviewed Mental Health Discussed, Anxiety, Depression, Grief and Loss, Mental Health Reviewed, Substance Abuse, Coping Strategies, Suicide, Crisis  Nutrition Interventions   Nutrition Discussed/Reviewed Nutrition Discussed, Adding fruits and vegetables, Increasing proteins, Decreasing fats, Decreasing salt, Supplemental nutrition, Decreasing sugar intake, Carbohydrate meal planning, Portion sizes, Fluid intake, Nutrition Reviewed  [Encouraged]  Pharmacy Interventions   Pharmacy Dicussed/Reviewed Pharmacy Topics Discussed, Medications and their functions, Medication Adherence, Pharmacy Topics Reviewed, Affording Medications  Safety Interventions   Safety Discussed/Reviewed Safety Discussed, Safety Reviewed, Fall Risk  Home Safety Assistive Devices  Advanced Directive Interventions   Advanced Directives Discussed/Reviewed Advanced Directives Discussed, Advanced Directives Reviewed, Guardianship, Provided resource for acquiring and filling out documents  [Provided Advanced Directives  (Living Will & Health Care Power of Attorney Documents) & Encouraged Guardianship]      Active Listening & Reflection Utilized.  Verbalization of Feelings Encouraged.  Emotional Support Provided. Problem Solving Interventions Employed.   Solution-Focused Strategies Implemented. CSW Collaboration with Daughter, Mary Blevins to Confirm Patient's Continued Residency at [facility] [phone]), to  Receive Short-Term Rehabilitative Services, Prior to Transitioning into a Long-Term Care Medicaid Bed. CSW Collaboration with Daughter, Mary Blevins to Confirm Application for Special Assistance Long-Term Care Medicaid Still Pending, through [facility] of Social Services [phone]), As of 11/23/2022. CSW Collaboration with Daughter, Mary Blevins to Confirm Initiation of Process to [employer], [employer] a Petition with [employer] of [employer] at [employer] [phone]). CSW Collaboration with Daughter, Mary Blevins to [employer] with CSW [phone]) if She Has Questions, Needs Assistance, or If Additional Social Work Needs Are Identified Between Now & & Our Next Follow-Up Outreach Call, Scheduled on 12/20/2022 at 9:00 AM.      SDOH assessments and interventions completed:  Yes.  Care Coordination Interventions:  Yes, provided.   Follow up plan: Follow up call scheduled for 12/20/2022 at 9:00 am.  Encounter Outcome:  Patient Visit Completed.   Danford Bad, BSW, MSW, Printmaker Social Work Case Set designer Health  [facility], Population Health Direct Dial: [phone]  Fax: [phone] Email: [email or web address] Website: [email or web address]

## 2022-11-23 NOTE — Patient Instructions (Signed)
Visit Information  Thank you for taking time to visit with me today. Please don't hesitate to contact me if I can be of assistance to you.   Following are the goals we discussed today:   Goals Addressed               This Visit's Progress     Receive Counseling & Supportive Services. (pt-stated)   On track     Care Coordination Interventions:  Interventions Today    Flowsheet Row Most Recent Value  Chronic Disease   Chronic disease during today's visit Chronic Obstructive Pulmonary Disease (COPD), Other  [Generalized Anxiety Disorder, Chronic Back Pain, Grief & Loss, Altered Mental Status, Inability to Perform Activities of Daily Living Independently, Chronic Insomnia & Depression]  General Interventions   General Interventions Discussed/Reviewed General Interventions Discussed, Labs, Vaccines, Doctor Visits, Health Screening, Annual Foot Exam, General Interventions Reviewed, Lipid Profile, Annual Eye Exam, Durable Medical Equipment (DME), [employer], Communication with, Level of Care  [Comunication with Care Team Members]  Labs Hgb A1c every 3 months, Kidney Function  [Encouraged]  Vaccines COVID-19, Flu, Pneumonia, RSV, Shingles, Tetanus/Pertussis/Diphtheria  [Encouraged]  Doctor Visits Discussed/Reviewed Doctor Visits Discussed, Specialist, Doctor Visits Reviewed, Annual Wellness Visits, PCP  [Encouraged]  Health Screening Bone Density, Colonoscopy, Mammogram  [Encouraged]  Durable Medical Equipment (DME) BP Cuff, Bed side commode, Environmental consultant, Wheelchair, Tour manager, Other  [Prescription Glasses, Hospital Bed]  Wheelchair Standard  PCP/Specialist Visits Compliance with follow-up visit  Communication with PCP/Specialists, RN  Level of Care Skilled Nursing Biochemist, clinical Applying for Special Assistance Long-Term Care Medicaid]  Exercise Interventions   Exercise Discussed/Reviewed Exercise Discussed, Assistive device use and maintanence, Exercise  Reviewed, Physical Activity, Weight Managment  [Encouraged]  Physical Activity Discussed/Reviewed Physical Activity Discussed, Home Exercise Program (HEP), Physical Activity Reviewed, PREP, Gym, Types of exercise  [Encouraged]  Weight Management Weight maintenance  [Encouraged]  Education Interventions   Education Provided Provided Education  Provided Verbal Education On Nutrition, Mental Health/Coping with Illness, When to see the doctor, Foot Care, Eye Care, Labs, Applications, [employer], Exercise, Medication, Insurance Plans  Labs Reviewed Hgb A1c  Applications Medicaid  [Assistance Applying for Special Assistance Long-Term Care Medicaid]  Mental Health Interventions   Mental Health Discussed/Reviewed Mental Health Discussed, Anxiety, Depression, Grief and Loss, Mental Health Reviewed, Substance Abuse, Coping Strategies, Suicide, Crisis  Nutrition Interventions   Nutrition Discussed/Reviewed Nutrition Discussed, Adding fruits and vegetables, Increasing proteins, Decreasing fats, Decreasing salt, Supplemental nutrition, Decreasing sugar intake, Carbohydrate meal planning, Portion sizes, Fluid intake, Nutrition Reviewed  [Encouraged]  Pharmacy Interventions   Pharmacy Dicussed/Reviewed Pharmacy Topics Discussed, Medications and their functions, Medication Adherence, Pharmacy Topics Reviewed, Affording Medications  Safety Interventions   Safety Discussed/Reviewed Safety Discussed, Safety Reviewed, Fall Risk  Home Safety Assistive Devices  Advanced Directive Interventions   Advanced Directives Discussed/Reviewed Advanced Directives Discussed, Advanced Directives Reviewed, Guardianship, Provided resource for acquiring and filling out documents  [Provided Advanced Directives (Living Will & Health Care Power of Attorney Documents) & Encouraged Guardianship]      Active Listening & Reflection Utilized.  Verbalization of Feelings Encouraged.  Emotional Support Provided. Problem Solving  Interventions Employed.   Solution-Focused Strategies Implemented. CSW Collaboration with Daughter, [name] to Confirm Patient's Continued Residency at [facility] [phone]), to Receive Short-Term Rehabilitative Services, Prior to Transitioning into a Long-Term Care Medicaid Bed. CSW Collaboration with Daughter, [name] to Confirm Application for Special Assistance Long-Term Care Medicaid Still Pending, through  [facility] Department of Social Services [phone]), As of 11/23/2022. CSW Collaboration with Daughter, [name] to Confirm Initiation of Process to [employer], [employer] a Petition with [employer] of [employer] at [employer] [phone]). CSW Collaboration with Daughter, [name] to [employer] with CSW [phone]) if She Has Questions, Needs Assistance, or If Additional Social Work Needs Are Identified Between Now & & Our Next Follow-Up Outreach Call, Scheduled on 12/20/2022 at 9:00 AM.      Our next appointment is by telephone on 12/20/2022 at 9:00 am.  Please call the care guide team at [phone] if you need to cancel or reschedule your appointment.   If you are experiencing a Mental Health or Behavioral Health Crisis or need someone to talk to, please call the Suicide and Crisis Lifeline: 988 call the [place] National Suicide Prevention Lifeline: 862 368 8672 or TTY: 531-697-7246 TTY (515) 517-8981) to talk to a trained counselor call 1-800-273-TALK (toll free, 24 hour hotline) go to [facility] [address] [place] [phone]) call the [facility] Crisis Line: [phone] call 911  Patient verbalizes understanding of instructions and care plan provided today and agrees to view in MyChart. Active MyChart status and patient understanding of how to access instructions and care plan  via MyChart confirmed with patient.     Telephone follow up appointment with care management team member scheduled for: 12/20/2022 at 9:00 am.  Danford Bad, BSW, MSW, LCSW  Embedded Practice Social Work Case Manager  [facility], Population Health Direct Dial: [phone]  Fax: [phone] Email: [email or web address] Website: [email or web address]

## 2022-11-27 DIAGNOSIS — J449 Chronic obstructive pulmonary disease, unspecified: Secondary | ICD-10-CM | POA: Diagnosis not present

## 2022-11-27 DIAGNOSIS — G4700 Insomnia, unspecified: Secondary | ICD-10-CM | POA: Diagnosis not present

## 2022-11-27 DIAGNOSIS — I358 Other nonrheumatic aortic valve disorders: Secondary | ICD-10-CM | POA: Diagnosis not present

## 2022-12-06 DIAGNOSIS — G5600 Carpal tunnel syndrome, unspecified upper limb: Secondary | ICD-10-CM | POA: Diagnosis not present

## 2022-12-06 DIAGNOSIS — M25531 Pain in right wrist: Secondary | ICD-10-CM | POA: Diagnosis not present

## 2022-12-06 DIAGNOSIS — F411 Generalized anxiety disorder: Secondary | ICD-10-CM | POA: Diagnosis not present

## 2022-12-06 DIAGNOSIS — M6281 Muscle weakness (generalized): Secondary | ICD-10-CM | POA: Diagnosis not present

## 2022-12-06 DIAGNOSIS — M79641 Pain in right hand: Secondary | ICD-10-CM | POA: Diagnosis not present

## 2022-12-06 DIAGNOSIS — R2689 Other abnormalities of gait and mobility: Secondary | ICD-10-CM | POA: Diagnosis not present

## 2022-12-06 DIAGNOSIS — F02A2 Dementia in other diseases classified elsewhere, mild, with psychotic disturbance: Secondary | ICD-10-CM | POA: Diagnosis not present

## 2022-12-06 DIAGNOSIS — F32A Depression, unspecified: Secondary | ICD-10-CM | POA: Diagnosis not present

## 2022-12-06 DIAGNOSIS — F331 Major depressive disorder, recurrent, moderate: Secondary | ICD-10-CM | POA: Diagnosis not present

## 2022-12-07 DIAGNOSIS — M6281 Muscle weakness (generalized): Secondary | ICD-10-CM | POA: Diagnosis not present

## 2022-12-07 DIAGNOSIS — R2689 Other abnormalities of gait and mobility: Secondary | ICD-10-CM | POA: Diagnosis not present

## 2022-12-08 DIAGNOSIS — R2689 Other abnormalities of gait and mobility: Secondary | ICD-10-CM | POA: Diagnosis not present

## 2022-12-08 DIAGNOSIS — M6281 Muscle weakness (generalized): Secondary | ICD-10-CM | POA: Diagnosis not present

## 2022-12-11 DIAGNOSIS — R2689 Other abnormalities of gait and mobility: Secondary | ICD-10-CM | POA: Diagnosis not present

## 2022-12-11 DIAGNOSIS — M6281 Muscle weakness (generalized): Secondary | ICD-10-CM | POA: Diagnosis not present

## 2022-12-12 DIAGNOSIS — M6281 Muscle weakness (generalized): Secondary | ICD-10-CM | POA: Diagnosis not present

## 2022-12-12 DIAGNOSIS — R2689 Other abnormalities of gait and mobility: Secondary | ICD-10-CM | POA: Diagnosis not present

## 2022-12-13 DIAGNOSIS — M6281 Muscle weakness (generalized): Secondary | ICD-10-CM | POA: Diagnosis not present

## 2022-12-13 DIAGNOSIS — R2689 Other abnormalities of gait and mobility: Secondary | ICD-10-CM | POA: Diagnosis not present

## 2022-12-14 DIAGNOSIS — M6281 Muscle weakness (generalized): Secondary | ICD-10-CM | POA: Diagnosis not present

## 2022-12-14 DIAGNOSIS — R2689 Other abnormalities of gait and mobility: Secondary | ICD-10-CM | POA: Diagnosis not present

## 2022-12-15 DIAGNOSIS — M6281 Muscle weakness (generalized): Secondary | ICD-10-CM | POA: Diagnosis not present

## 2022-12-15 DIAGNOSIS — R2689 Other abnormalities of gait and mobility: Secondary | ICD-10-CM | POA: Diagnosis not present

## 2022-12-17 DIAGNOSIS — R2689 Other abnormalities of gait and mobility: Secondary | ICD-10-CM | POA: Diagnosis not present

## 2022-12-17 DIAGNOSIS — M6281 Muscle weakness (generalized): Secondary | ICD-10-CM | POA: Diagnosis not present

## 2022-12-18 DIAGNOSIS — R5381 Other malaise: Secondary | ICD-10-CM | POA: Diagnosis not present

## 2022-12-18 DIAGNOSIS — R040 Epistaxis: Secondary | ICD-10-CM | POA: Diagnosis not present

## 2022-12-18 DIAGNOSIS — R2689 Other abnormalities of gait and mobility: Secondary | ICD-10-CM | POA: Diagnosis not present

## 2022-12-18 DIAGNOSIS — M6281 Muscle weakness (generalized): Secondary | ICD-10-CM | POA: Diagnosis not present

## 2022-12-20 ENCOUNTER — Ambulatory Visit: Payer: Self-pay | Admitting: *Deleted

## 2022-12-20 ENCOUNTER — Encounter: Payer: Self-pay | Admitting: *Deleted

## 2022-12-20 DIAGNOSIS — R2689 Other abnormalities of gait and mobility: Secondary | ICD-10-CM | POA: Diagnosis not present

## 2022-12-20 DIAGNOSIS — M6281 Muscle weakness (generalized): Secondary | ICD-10-CM | POA: Diagnosis not present

## 2022-12-20 NOTE — Patient Outreach (Signed)
Care Coordination   Follow Up Visit Note   12/20/2022  Name: Mary Blevins MRN: [id number] DOB: 30-Jun-1945  Mary Blevins is a 77 y.o. year old female who sees Pickard, Priscille Heidelberg, MD for primary care. I spoke with Mary Blevins by phone today.  What matters to the patients health and wellness today?  Receive Counseling & Supportive Services.   Goals Addressed               This Visit's Progress     Receive Counseling & Supportive Services. (pt-stated)   On track     Care Coordination Interventions:  Interventions Today    Flowsheet Row Most Recent Value  Chronic Disease   Chronic disease during today's visit Chronic Obstructive Pulmonary Disease (COPD), Other  [Generalized Anxiety Disorder, Chronic Back Pain, Grief & Loss, Altered Mental Status, Inability to Perform Activities of Daily Living Independently, Chronic Insomnia & Depression, Dementia with Behavioral Disturbance, Bilateral Glaucoma]  General Interventions   General Interventions Discussed/Reviewed General Interventions Discussed, General Interventions Reviewed, Level of Care, Communication with, Durable Medical Equipment (DME), Doctor Visits  [Communication with Care Team Members]  Doctor Visits Discussed/Reviewed Specialist, Doctor Visits Discussed, Doctor Visits Reviewed, Annual Wellness Visits, PCP  [Encouraged Routine Engagement]  Durable Medical Equipment (DME) Bed side commode, BP Cuff, Environmental consultant, Wheelchair, Tour manager, Other  [Prescription Eyeglasses, Manufacturing systems engineer, Fish farm manager, Energy manager, [employer], Cane]  Wheelchair Standard  PCP/Specialist Visits Compliance with follow-up visit  Communication with PCP/Specialists, Charity fundraiser, Pharmacists, Social Work  [employer] Routine Engagement]  Level of Care Skilled Nursing Facility  [Confirmed Desire to Continue to Reside at [facility] for Long-Term Care Placement]  Applications Medicaid  [Confirmed  Application for Special Assistance Long-Term Care Medicaid Still Pending with [facility] Department of Social Services]  Education Interventions   Education Provided Provided Education  Provided Verbal Education On Mental Health/Coping with Illness, When to see the doctor, Air traffic controller, [employer], English as a second language teacher for Special Assistance Long-Term Care Medicaid Still Pending with [facility] Department of Social Services]  Mental Health Interventions   Mental Health Discussed/Reviewed Mental Health Discussed, Anxiety, Depression, Mental Health Reviewed, Grief and Loss, Substance Abuse, Coping Strategies, Suicide, Crisis  [Assessed Mental Health Status]  Safety Interventions   Safety Discussed/Reviewed Safety Discussed, Safety Reviewed, Fall Risk  [Encouraged Continued Participation with Physical & Occupational Therapy Services]  Home Safety Assistive Devices  [Encouraged Consistent Use of Assistive Devices]  Advanced Directive Interventions   Advanced Directives Discussed/Reviewed Advanced Directives Discussed  [Encouraged Initiation, Offering to [employer] & Assist with Completion]      Active Listening & Reflection Utilized.  Verbalization of Feelings Encouraged.  Emotional Support Provided. Cognitive Behavioral Therapy Initiated. Client-Centered Therapy Performed. Acceptance & Commitment Therapy Conducted. Confirmed Desire to Continue to Reside at [facility] [phone]), for Long-Term Care Placement.  Confirmed Application for Special Assistance Long-Term Care Medicaid is Still Pending, through [facility] of Social Services [phone]), As of 12/20/2022. Confirmed Daughter, [name] Was Appointed Legal Guardianship, through [employer] with The [facility] [phone]). Encouraged Routine Engagement with  Danford Bad, Licensed Clinical Social Worker with [facility] [phone]) if You Have Questions, Need Assistance, or If Additional Social Work Needs Are Identified Between Now & & Our Next Follow-Up Outreach Call, Scheduled on 01/15/2023 at 9:00 AM.        SDOH assessments  and interventions completed:  Yes.  SDOH Interventions Today    Flowsheet Row Most Recent Value  SDOH Interventions   Food Insecurity Interventions Intervention Not Indicated  Housing Interventions Intervention Not Indicated  Transportation Interventions Intervention Not Indicated, Patient Resources (Friends/Family), Payor Benefit  Utilities Interventions Intervention Not Indicated  Alcohol Usage Interventions Intervention Not Indicated (Score <7)  Financial Strain Interventions Intervention Not Indicated  Physical Activity Interventions Intervention Not Indicated  Stress Interventions Intervention Not Indicated  Social Connections Interventions Intervention Not Indicated  Health Literacy Interventions Intervention Not Indicated     Care Coordination Interventions:  Yes, provided.   Follow up plan: Follow up call scheduled for 01/15/2023 at 9:00 am.  Encounter Outcome:  Patient Visit Completed.   Danford Bad, BSW, MSW, Printmaker Social Work Case Set designer Health  [facility], Population Health Direct Dial: [phone]  Fax: [phone] Email: [email or web address] Website: [email or web address]

## 2022-12-20 NOTE — Patient Instructions (Signed)
Visit Information  Thank you for taking time to visit with me today. Please don't hesitate to contact me if I can be of assistance to you.   Following are the goals we discussed today:   Goals Addressed               This Visit's Progress     Receive Counseling & Supportive Services. (pt-stated)   On track     Care Coordination Interventions:  Interventions Today    Flowsheet Row Most Recent Value  Chronic Disease   Chronic disease during today's visit Chronic Obstructive Pulmonary Disease (COPD), Other  [Generalized Anxiety Disorder, Chronic Back Pain, Grief & Loss, Altered Mental Status, Inability to Perform Activities of Daily Living Independently, Chronic Insomnia & Depression, Dementia with Behavioral Disturbance, Bilateral Glaucoma]  General Interventions   General Interventions Discussed/Reviewed General Interventions Discussed, General Interventions Reviewed, Level of Care, Communication with, Durable Medical Equipment (DME), Doctor Visits  [Communication with Care Team Members]  Doctor Visits Discussed/Reviewed Specialist, Doctor Visits Discussed, Doctor Visits Reviewed, Annual Wellness Visits, PCP  [Encouraged Routine Engagement]  Durable Medical Equipment (DME) Bed side commode, BP Cuff, Environmental consultant, Wheelchair, Tour manager, Other  [Prescription Eyeglasses, Manufacturing systems engineer, Fish farm manager, Energy manager, [employer], Cane]  Wheelchair Standard  PCP/Specialist Visits Compliance with follow-up visit  Communication with PCP/Specialists, Charity fundraiser, Pharmacists, Social Work  [employer] Routine Engagement]  Level of Care Skilled Nursing Facility  [Confirmed Desire to Continue to Reside at [facility] for Long-Term Care Placement]  Applications Medicaid  [Confirmed Application for Special Assistance Long-Term Care Medicaid Still Pending with [facility] Department of Social Services]  Education Interventions   Education Provided Provided  Education  Provided Verbal Education On Mental Health/Coping with Illness, When to see the doctor, Air traffic controller, [employer], English as a second language teacher for Special Assistance Long-Term Care Medicaid Still Pending with [facility] Department of Social Services]  Mental Health Interventions   Mental Health Discussed/Reviewed Mental Health Discussed, Anxiety, Depression, Mental Health Reviewed, Grief and Loss, Substance Abuse, Coping Strategies, Suicide, Crisis  [Assessed Mental Health Status]  Safety Interventions   Safety Discussed/Reviewed Safety Discussed, Safety Reviewed, Fall Risk  [Encouraged Continued Participation with Physical & Occupational Therapy Services]  Home Safety Assistive Devices  [Encouraged Consistent Use of Assistive Devices]  Advanced Directive Interventions   Advanced Directives Discussed/Reviewed Advanced Directives Discussed  [Encouraged Initiation, Offering to [employer] & Assist with Completion]      Active Listening & Reflection Utilized.  Verbalization of Feelings Encouraged.  Emotional Support Provided. Cognitive Behavioral Therapy Initiated. Client-Centered Therapy Performed. Acceptance & Commitment Therapy Conducted. Confirmed Desire to Continue to Reside at [facility] [phone]), for Long-Term Care Placement.  Confirmed Application for Special Assistance Long-Term Care Medicaid is Still Pending, through [facility] of Social Services [phone]), As of 12/20/2022. Confirmed Daughter, [name] Was Appointed Legal Guardianship, through [employer] with The [facility] [phone]). Encouraged Routine Engagement with Danford Bad, Licensed Clinical Social Worker with [facility] [phone]) if You Have Questions, Need Assistance, or If Additional Social Work Needs Are  Identified Between Now & & Our Next Follow-Up Outreach Call, Scheduled on 01/15/2023 at 9:00 AM.      Our next appointment is by telephone on 01/15/2023 at 9:00 am.  Please call the care guide team at [phone] if you need to cancel or reschedule your appointment.   If you  are experiencing a Mental Health or Behavioral Health Crisis or need someone to talk to, please call the Suicide and Crisis Lifeline: 988 call the [place] National Suicide Prevention Lifeline: 937 686 4302 or TTY: (302)628-6267 TTY 662-233-6267) to talk to a trained counselor call 1-800-273-TALK (toll free, 24 hour hotline) go to [facility] [address] [place] [phone]) call the [facility] Crisis Line: [phone] call 911  Patient verbalizes understanding of instructions and care plan provided today and agrees to view in MyChart. Active MyChart status and patient understanding of how to access instructions and care plan via MyChart confirmed with patient.     Telephone follow up appointment with care management team member scheduled for:  01/15/2023 at 9:00 am.  Danford Bad, BSW, MSW, LCSW  Embedded Practice Social Work Case Manager  [facility], Population Health Direct Dial: [phone]  Fax: [phone] Email: [email or web address]: [email or web address]

## 2022-12-21 DIAGNOSIS — M6281 Muscle weakness (generalized): Secondary | ICD-10-CM | POA: Diagnosis not present

## 2022-12-21 DIAGNOSIS — R2689 Other abnormalities of gait and mobility: Secondary | ICD-10-CM | POA: Diagnosis not present

## 2022-12-22 DIAGNOSIS — R2689 Other abnormalities of gait and mobility: Secondary | ICD-10-CM | POA: Diagnosis not present

## 2022-12-22 DIAGNOSIS — M6281 Muscle weakness (generalized): Secondary | ICD-10-CM | POA: Diagnosis not present

## 2022-12-25 DIAGNOSIS — R2689 Other abnormalities of gait and mobility: Secondary | ICD-10-CM | POA: Diagnosis not present

## 2022-12-25 DIAGNOSIS — M6281 Muscle weakness (generalized): Secondary | ICD-10-CM | POA: Diagnosis not present

## 2022-12-26 DIAGNOSIS — R2689 Other abnormalities of gait and mobility: Secondary | ICD-10-CM | POA: Diagnosis not present

## 2022-12-26 DIAGNOSIS — M6281 Muscle weakness (generalized): Secondary | ICD-10-CM | POA: Diagnosis not present

## 2022-12-27 DIAGNOSIS — M6281 Muscle weakness (generalized): Secondary | ICD-10-CM | POA: Diagnosis not present

## 2022-12-27 DIAGNOSIS — R2689 Other abnormalities of gait and mobility: Secondary | ICD-10-CM | POA: Diagnosis not present

## 2022-12-28 DIAGNOSIS — R2689 Other abnormalities of gait and mobility: Secondary | ICD-10-CM | POA: Diagnosis not present

## 2022-12-28 DIAGNOSIS — M6281 Muscle weakness (generalized): Secondary | ICD-10-CM | POA: Diagnosis not present

## 2022-12-29 DIAGNOSIS — M6281 Muscle weakness (generalized): Secondary | ICD-10-CM | POA: Diagnosis not present

## 2022-12-29 DIAGNOSIS — R2689 Other abnormalities of gait and mobility: Secondary | ICD-10-CM | POA: Diagnosis not present

## 2023-01-01 DIAGNOSIS — M6281 Muscle weakness (generalized): Secondary | ICD-10-CM | POA: Diagnosis not present

## 2023-01-01 DIAGNOSIS — E039 Hypothyroidism, unspecified: Secondary | ICD-10-CM | POA: Diagnosis not present

## 2023-01-01 DIAGNOSIS — F0390 Unspecified dementia without behavioral disturbance: Secondary | ICD-10-CM | POA: Diagnosis not present

## 2023-01-01 DIAGNOSIS — R2689 Other abnormalities of gait and mobility: Secondary | ICD-10-CM | POA: Diagnosis not present

## 2023-01-01 DIAGNOSIS — J449 Chronic obstructive pulmonary disease, unspecified: Secondary | ICD-10-CM | POA: Diagnosis not present

## 2023-01-02 DIAGNOSIS — I10 Essential (primary) hypertension: Secondary | ICD-10-CM | POA: Diagnosis not present

## 2023-01-02 DIAGNOSIS — D122 Benign neoplasm of ascending colon: Secondary | ICD-10-CM | POA: Diagnosis not present

## 2023-01-03 DIAGNOSIS — J449 Chronic obstructive pulmonary disease, unspecified: Secondary | ICD-10-CM | POA: Diagnosis not present

## 2023-01-03 DIAGNOSIS — E039 Hypothyroidism, unspecified: Secondary | ICD-10-CM | POA: Diagnosis not present

## 2023-01-03 DIAGNOSIS — R2689 Other abnormalities of gait and mobility: Secondary | ICD-10-CM | POA: Diagnosis not present

## 2023-01-03 DIAGNOSIS — F0390 Unspecified dementia without behavioral disturbance: Secondary | ICD-10-CM | POA: Diagnosis not present

## 2023-01-03 DIAGNOSIS — M6281 Muscle weakness (generalized): Secondary | ICD-10-CM | POA: Diagnosis not present

## 2023-01-04 DIAGNOSIS — R2689 Other abnormalities of gait and mobility: Secondary | ICD-10-CM | POA: Diagnosis not present

## 2023-01-04 DIAGNOSIS — M6281 Muscle weakness (generalized): Secondary | ICD-10-CM | POA: Diagnosis not present

## 2023-01-08 ENCOUNTER — Encounter: Payer: Self-pay | Admitting: Orthopaedic Surgery

## 2023-01-08 ENCOUNTER — Ambulatory Visit (INDEPENDENT_AMBULATORY_CARE_PROVIDER_SITE_OTHER): Payer: Medicare HMO | Admitting: Orthopaedic Surgery

## 2023-01-08 DIAGNOSIS — G5601 Carpal tunnel syndrome, right upper limb: Secondary | ICD-10-CM | POA: Diagnosis not present

## 2023-01-08 MED ORDER — LIDOCAINE HCL 1 % IJ SOLN
1.0000 mL | INTRAMUSCULAR | Status: AC | PRN
Start: 2023-01-08 — End: 2023-01-08
  Administered 2023-01-08: 1 mL

## 2023-01-08 MED ORDER — METHYLPREDNISOLONE ACETATE 40 MG/ML IJ SUSP
40.0000 mg | INTRAMUSCULAR | Status: AC | PRN
Start: 2023-01-08 — End: 2023-01-08
  Administered 2023-01-08: 40 mg

## 2023-01-08 NOTE — Progress Notes (Signed)
The patient is a 77 year old female that I am seeing for the first time.  She is sent to me to evaluate and treat right hand pain with numbness and tingling.  She does wear a wrist plan at night and she says the facility where she is staying does try Biofreeze which has been helpful.  One of her comorbidities it is listed as dementia but she says she really does not have dementia and she is clear.  She actually answers all questions appropriate in the office today interacts with me appropriately.  She does report pain around the base of her thumb but mainly numbness and tingling in her median nerve distribution is where she points to.  She does ambulate with a walker and that puts pressure on her wrist as well.  She denies any specific injury.  On exam she does have weak pinch and grip strength on the right side.  There is some slight muscle atrophy in the hand.  She has positive Phalen's and Tinel's exams as well.  Her signs and symptoms are consistent with carpal tunnel syndrome.  Her hand is well-perfused.  I did recommend a steroid injection over the transverse carpal ligament so we can see how she responds to that and that I can see her back in 6 weeks and can go from there in terms of whether or not to consider outpatient carpal tunnel surgery.  I would certainly still like to get permission from family as well as but even in 6 weeks we can reevaluate her and even get a sense of what the level dementia she may or may not have she agrees as well.    Procedure Note  Patient: Mary Blevins             Date of Birth: 11-12-1945           MRN: [id number]             Visit Date: 01/08/2023  Procedures: Visit Diagnoses: No diagnosis found.  Hand/UE Inj: R carpal tunnel for carpal tunnel syndrome on 01/08/2023 2:37 PM Medications: 1 mL lidocaine 1 %; 40 mg methylPREDNISolone acetate 40 MG/ML

## 2023-01-09 ENCOUNTER — Telehealth: Payer: Self-pay

## 2023-01-09 DIAGNOSIS — R2689 Other abnormalities of gait and mobility: Secondary | ICD-10-CM | POA: Diagnosis not present

## 2023-01-09 DIAGNOSIS — F02A2 Dementia in other diseases classified elsewhere, mild, with psychotic disturbance: Secondary | ICD-10-CM | POA: Diagnosis not present

## 2023-01-09 DIAGNOSIS — F32A Depression, unspecified: Secondary | ICD-10-CM | POA: Diagnosis not present

## 2023-01-09 DIAGNOSIS — M6281 Muscle weakness (generalized): Secondary | ICD-10-CM | POA: Diagnosis not present

## 2023-01-09 NOTE — Telephone Encounter (Signed)
Copied from CRM [id number]. Topic: General - Other >> Jan 09, 2023  9:19 AM Maxwell Marion wrote: Reason for CRM: Pt wants to let Dr. Tanya Nones know she does not have dementia. Says she is back to her normal self. Is currently in a nursing home/rehab facility but she is getting ready to get out and just wanted to let Dr. Tanya Nones know she doesn't have dementia because she saw that he diagnosed her with that .

## 2023-01-10 DIAGNOSIS — E039 Hypothyroidism, unspecified: Secondary | ICD-10-CM | POA: Diagnosis not present

## 2023-01-10 DIAGNOSIS — M25531 Pain in right wrist: Secondary | ICD-10-CM | POA: Diagnosis not present

## 2023-01-10 DIAGNOSIS — M6281 Muscle weakness (generalized): Secondary | ICD-10-CM | POA: Diagnosis not present

## 2023-01-10 DIAGNOSIS — R2689 Other abnormalities of gait and mobility: Secondary | ICD-10-CM | POA: Diagnosis not present

## 2023-01-10 DIAGNOSIS — M79641 Pain in right hand: Secondary | ICD-10-CM | POA: Diagnosis not present

## 2023-01-10 DIAGNOSIS — J449 Chronic obstructive pulmonary disease, unspecified: Secondary | ICD-10-CM | POA: Diagnosis not present

## 2023-01-10 DIAGNOSIS — F0390 Unspecified dementia without behavioral disturbance: Secondary | ICD-10-CM | POA: Diagnosis not present

## 2023-01-11 DIAGNOSIS — R2689 Other abnormalities of gait and mobility: Secondary | ICD-10-CM | POA: Diagnosis not present

## 2023-01-11 DIAGNOSIS — M6281 Muscle weakness (generalized): Secondary | ICD-10-CM | POA: Diagnosis not present

## 2023-01-12 DIAGNOSIS — H02401 Unspecified ptosis of right eyelid: Secondary | ICD-10-CM | POA: Diagnosis not present

## 2023-01-12 DIAGNOSIS — Z961 Presence of intraocular lens: Secondary | ICD-10-CM | POA: Diagnosis not present

## 2023-01-12 DIAGNOSIS — H524 Presbyopia: Secondary | ICD-10-CM | POA: Diagnosis not present

## 2023-01-12 DIAGNOSIS — H40013 Open angle with borderline findings, low risk, bilateral: Secondary | ICD-10-CM | POA: Diagnosis not present

## 2023-01-12 DIAGNOSIS — R2689 Other abnormalities of gait and mobility: Secondary | ICD-10-CM | POA: Diagnosis not present

## 2023-01-12 DIAGNOSIS — M6281 Muscle weakness (generalized): Secondary | ICD-10-CM | POA: Diagnosis not present

## 2023-01-15 ENCOUNTER — Ambulatory Visit: Payer: Self-pay | Admitting: *Deleted

## 2023-01-15 ENCOUNTER — Encounter: Payer: Self-pay | Admitting: *Deleted

## 2023-01-15 DIAGNOSIS — R2689 Other abnormalities of gait and mobility: Secondary | ICD-10-CM | POA: Diagnosis not present

## 2023-01-15 DIAGNOSIS — R059 Cough, unspecified: Secondary | ICD-10-CM | POA: Diagnosis not present

## 2023-01-15 DIAGNOSIS — M6281 Muscle weakness (generalized): Secondary | ICD-10-CM | POA: Diagnosis not present

## 2023-01-15 NOTE — Patient Outreach (Signed)
Care Coordination   Follow Up Visit Note   01/15/2023  Name: Mary Blevins MRN: [id number] DOB: 1945-08-10  Mary Blevins is a 77 y.o. year old female who sees Pickard, Priscille Heidelberg, MD for primary care. I spoke with Mary Blevins by phone today.  What matters to the patients health and wellness today? Receive Counseling & Supportive Services.    Goals Addressed               This Visit's Progress     COMPLETED: Receive Counseling & Supportive Services. (pt-stated)   On track     Care Coordination Interventions:  Interventions Today    Flowsheet Row Most Recent Value  Chronic Disease   Chronic disease during today's visit Chronic Obstructive Pulmonary Disease (COPD), Other  [Generalized Anxiety Disorder, Chronic Back Pain, Grief & Loss, Altered Mental Status, Inability to Perform Activities of Daily Living Independently, Chronic Insomnia & Depression, Dementia with Behavioral Disturbance, Bilateral Glaucoma]  General Interventions   General Interventions Discussed/Reviewed General Interventions Discussed, General Interventions Reviewed, Level of Care, Communication with, Durable Medical Equipment (DME), Doctor Visits  [Communication with Care Team Members]  Doctor Visits Discussed/Reviewed Specialist, Doctor Visits Discussed, Doctor Visits Reviewed, Annual Wellness Visits, PCP  [Encouraged Routine Engagement]  Durable Medical Equipment (DME) Bed side commode, BP Cuff, Environmental consultant, Wheelchair, Tour manager, Other  [Prescription Eyeglasses, Manufacturing systems engineer, Fish farm manager, Energy manager, [employer], Cane]  Wheelchair Standard  PCP/Specialist Visits Compliance with follow-up visit  Communication with PCP/Specialists, Charity fundraiser, Pharmacists, Social Work  [employer] Routine Engagement]  Level of Care Skilled Nursing Facility  [Confirmed Desire to Continue to Reside at [facility] for Long-Term Care Placement]  Applications Medicaid   [Confirmed Application for Special Assistance Long-Term Care Medicaid Still Pending with [facility] Department of Social Services]  Education Interventions   Education Provided Provided Education  Provided Verbal Education On Mental Health/Coping with Illness, When to see the doctor, Air traffic controller, [employer], English as a second language teacher for Special Assistance Long-Term Care Medicaid Still Pending with [facility] Department of Social Services]  Mental Health Interventions   Mental Health Discussed/Reviewed Mental Health Discussed, Anxiety, Depression, Mental Health Reviewed, Grief and Loss, Substance Abuse, Coping Strategies, Suicide, Crisis  [Assessed Mental Health Status]  Safety Interventions   Safety Discussed/Reviewed Safety Discussed, Safety Reviewed, Fall Risk  [Encouraged Continued Participation with Physical & Occupational Therapy Services]  Home Safety Assistive Devices  [Encouraged Consistent Use of Assistive Devices]  Advanced Directive Interventions   Advanced Directives Discussed/Reviewed Advanced Directives Discussed  [Encouraged Initiation, Offering to [employer] & Assist with Completion]      Active Listening & Reflection Utilized.  Verbalization of Feelings Encouraged.  Emotional Support Provided. Brief Cognitive Behavioral Therapy Performed. Acceptance & Commitment Therapy Initiated. Confirmed Continued Residency at [facility] [phone]).  Confirmed Approval for Special Assistance Long-Term Care Medicaid, through [facility] of Social Services [phone]). Encouraged Engagement with Danford Bad, Licensed Clinical Social Worker with [facility] [phone]) if You Have Questions, Need Assistance, Additional Social Work Needs Are Identified in the Near Future, or If You Change Your Mind About Wanting to Receive Social Work  Services.       SDOH assessments and interventions completed:  Yes.  Care Coordination Interventions:  Yes, provided.   Follow up plan: No further intervention required.   Encounter Outcome:  Patient Visit Completed.   Danford Bad, BSW, MSW, LCSW  Embedded  Practice Social Work Case Set designer Health  [facility], Population Health Direct Dial: [phone]  Fax: [phone] Email: [email or web address]: [email or web address]

## 2023-01-15 NOTE — Patient Instructions (Signed)
Visit Information  Thank you for taking time to visit with me today. Please don't hesitate to contact me if I can be of assistance to you.   Following are the goals we discussed today:   Goals Addressed               This Visit's Progress     COMPLETED: Receive Counseling & Supportive Services. (pt-stated)   On track     Care Coordination Interventions:  Interventions Today    Flowsheet Row Most Recent Value  Chronic Disease   Chronic disease during today's visit Chronic Obstructive Pulmonary Disease (COPD), Other  [Generalized Anxiety Disorder, Chronic Back Pain, Grief & Loss, Altered Mental Status, Inability to Perform Activities of Daily Living Independently, Chronic Insomnia & Depression, Dementia with Behavioral Disturbance, Bilateral Glaucoma]  General Interventions   General Interventions Discussed/Reviewed General Interventions Discussed, General Interventions Reviewed, Level of Care, Communication with, Durable Medical Equipment (DME), Doctor Visits  [Communication with Care Team Members]  Doctor Visits Discussed/Reviewed Specialist, Doctor Visits Discussed, Doctor Visits Reviewed, Annual Wellness Visits, PCP  [Encouraged Routine Engagement]  Durable Medical Equipment (DME) Bed side commode, BP Cuff, Environmental consultant, Wheelchair, Tour manager, Other  [Prescription Eyeglasses, Manufacturing systems engineer, Fish farm manager, Energy manager, [employer], Cane]  Wheelchair Standard  PCP/Specialist Visits Compliance with follow-up visit  Communication with PCP/Specialists, Charity fundraiser, Pharmacists, Social Work  [employer] Routine Engagement]  Level of Care Skilled Nursing Facility  [Confirmed Desire to Continue to Reside at [facility] for Long-Term Care Placement]  Applications Medicaid  [Confirmed Application for Special Assistance Long-Term Care Medicaid Still Pending with [facility] Department of Social Services]  Education Interventions   Education Provided  Provided Education  Provided Verbal Education On Mental Health/Coping with Illness, When to see the doctor, Air traffic controller, [employer], English as a second language teacher for Special Assistance Long-Term Care Medicaid Still Pending with [facility] Department of Social Services]  Mental Health Interventions   Mental Health Discussed/Reviewed Mental Health Discussed, Anxiety, Depression, Mental Health Reviewed, Grief and Loss, Substance Abuse, Coping Strategies, Suicide, Crisis  [Assessed Mental Health Status]  Safety Interventions   Safety Discussed/Reviewed Safety Discussed, Safety Reviewed, Fall Risk  [Encouraged Continued Participation with Physical & Occupational Therapy Services]  Home Safety Assistive Devices  [Encouraged Consistent Use of Assistive Devices]  Advanced Directive Interventions   Advanced Directives Discussed/Reviewed Advanced Directives Discussed  [Encouraged Initiation, Offering to [employer] & Assist with Completion]      Active Listening & Reflection Utilized.  Verbalization of Feelings Encouraged.  Emotional Support Provided. Brief Cognitive Behavioral Therapy Performed. Acceptance & Commitment Therapy Initiated. Confirmed Continued Residency at [facility] [phone]).  Confirmed Approval for Special Assistance Long-Term Care Medicaid, through [facility] of Social Services [phone]). Encouraged Engagement with Danford Bad, Licensed Clinical Social Worker with [facility] [phone]) if You Have Questions, Need Assistance, Additional Social Work Needs Are Identified in the Near Future, or If You Change Your Mind About Wanting to Receive Social Work Services.       Please call the care guide team at [phone] if you need to cancel or reschedule your appointment.   If you are experiencing a Mental Health or Behavioral Health Crisis  or need someone to talk to, please call the Suicide and Crisis Lifeline: 988 call the [place] National Suicide Prevention Lifeline: 608-505-9425 or TTY: 947-715-8550 TTY (934) 649-0452) to talk to a trained counselor call 1-800-273-TALK (toll free, 24 hour  hotline) go to [facility] [address], [place] [phone]) call the [facility] Crisis Line: [phone] call 911  Patient verbalizes understanding of instructions and care plan provided today and agrees to view in MyChart. Active MyChart status and patient understanding of how to access instructions and care plan via MyChart confirmed with patient.     No further follow up required.  Danford Bad, BSW, MSW, Printmaker Social Work Case Set designer Health  [facility], Population Health Direct Dial: [phone]  Fax: [phone] Email: [email or web address]: [email or web address]

## 2023-01-16 DIAGNOSIS — R051 Acute cough: Secondary | ICD-10-CM | POA: Diagnosis not present

## 2023-01-16 DIAGNOSIS — F0390 Unspecified dementia without behavioral disturbance: Secondary | ICD-10-CM | POA: Diagnosis not present

## 2023-01-16 DIAGNOSIS — R2689 Other abnormalities of gait and mobility: Secondary | ICD-10-CM | POA: Diagnosis not present

## 2023-01-16 DIAGNOSIS — M6281 Muscle weakness (generalized): Secondary | ICD-10-CM | POA: Diagnosis not present

## 2023-01-16 DIAGNOSIS — J449 Chronic obstructive pulmonary disease, unspecified: Secondary | ICD-10-CM | POA: Diagnosis not present

## 2023-01-16 DIAGNOSIS — E039 Hypothyroidism, unspecified: Secondary | ICD-10-CM | POA: Diagnosis not present

## 2023-01-20 DIAGNOSIS — M6281 Muscle weakness (generalized): Secondary | ICD-10-CM | POA: Diagnosis not present

## 2023-01-20 DIAGNOSIS — R2689 Other abnormalities of gait and mobility: Secondary | ICD-10-CM | POA: Diagnosis not present

## 2023-01-22 DIAGNOSIS — Z0100 Encounter for examination of eyes and vision without abnormal findings: Secondary | ICD-10-CM | POA: Diagnosis not present

## 2023-01-22 DIAGNOSIS — J449 Chronic obstructive pulmonary disease, unspecified: Secondary | ICD-10-CM | POA: Diagnosis not present

## 2023-01-22 DIAGNOSIS — R051 Acute cough: Secondary | ICD-10-CM | POA: Diagnosis not present

## 2023-01-22 DIAGNOSIS — F0390 Unspecified dementia without behavioral disturbance: Secondary | ICD-10-CM | POA: Diagnosis not present

## 2023-01-23 DIAGNOSIS — J449 Chronic obstructive pulmonary disease, unspecified: Secondary | ICD-10-CM | POA: Diagnosis not present

## 2023-01-23 DIAGNOSIS — R059 Cough, unspecified: Secondary | ICD-10-CM | POA: Diagnosis not present

## 2023-01-23 DIAGNOSIS — J101 Influenza due to other identified influenza virus with other respiratory manifestations: Secondary | ICD-10-CM | POA: Diagnosis not present

## 2023-01-24 DIAGNOSIS — J449 Chronic obstructive pulmonary disease, unspecified: Secondary | ICD-10-CM | POA: Diagnosis not present

## 2023-01-24 DIAGNOSIS — R051 Acute cough: Secondary | ICD-10-CM | POA: Diagnosis not present

## 2023-01-24 DIAGNOSIS — R2689 Other abnormalities of gait and mobility: Secondary | ICD-10-CM | POA: Diagnosis not present

## 2023-01-24 DIAGNOSIS — F0390 Unspecified dementia without behavioral disturbance: Secondary | ICD-10-CM | POA: Diagnosis not present

## 2023-01-24 DIAGNOSIS — M6281 Muscle weakness (generalized): Secondary | ICD-10-CM | POA: Diagnosis not present

## 2023-01-26 DIAGNOSIS — M6281 Muscle weakness (generalized): Secondary | ICD-10-CM | POA: Diagnosis not present

## 2023-01-26 DIAGNOSIS — R2689 Other abnormalities of gait and mobility: Secondary | ICD-10-CM | POA: Diagnosis not present

## 2023-01-29 DIAGNOSIS — M6281 Muscle weakness (generalized): Secondary | ICD-10-CM | POA: Diagnosis not present

## 2023-01-29 DIAGNOSIS — R051 Acute cough: Secondary | ICD-10-CM | POA: Diagnosis not present

## 2023-01-29 DIAGNOSIS — F0390 Unspecified dementia without behavioral disturbance: Secondary | ICD-10-CM | POA: Diagnosis not present

## 2023-01-29 DIAGNOSIS — R2689 Other abnormalities of gait and mobility: Secondary | ICD-10-CM | POA: Diagnosis not present

## 2023-01-29 DIAGNOSIS — J449 Chronic obstructive pulmonary disease, unspecified: Secondary | ICD-10-CM | POA: Diagnosis not present

## 2023-01-30 DIAGNOSIS — R2689 Other abnormalities of gait and mobility: Secondary | ICD-10-CM | POA: Diagnosis not present

## 2023-01-30 DIAGNOSIS — M6281 Muscle weakness (generalized): Secondary | ICD-10-CM | POA: Diagnosis not present

## 2023-01-31 DIAGNOSIS — R2689 Other abnormalities of gait and mobility: Secondary | ICD-10-CM | POA: Diagnosis not present

## 2023-01-31 DIAGNOSIS — M6281 Muscle weakness (generalized): Secondary | ICD-10-CM | POA: Diagnosis not present

## 2023-02-01 DIAGNOSIS — M6281 Muscle weakness (generalized): Secondary | ICD-10-CM | POA: Diagnosis not present

## 2023-02-01 DIAGNOSIS — R2689 Other abnormalities of gait and mobility: Secondary | ICD-10-CM | POA: Diagnosis not present

## 2023-02-15 ENCOUNTER — Telehealth: Payer: Self-pay | Admitting: Family Medicine

## 2023-02-15 NOTE — Telephone Encounter (Unsigned)
Copied from CRM [id number]. Topic: Clinical - Medication Question >> Feb 15, 2023 12:02 PM Gildardo Pounds wrote: Reason for CRM: Patient is currently in [facility]. Can you call patient something in for a really bad cough? Callback number [phone]

## 2023-02-16 ENCOUNTER — Telehealth: Payer: Self-pay

## 2023-02-16 ENCOUNTER — Telehealth: Payer: Self-pay | Admitting: Family Medicine

## 2023-02-16 NOTE — Telephone Encounter (Signed)
Copied from CRM [id number]. Topic: Clinical - Medication Question >> Feb 16, 2023  2:31 PM Eunice Blase wrote: Reason for CRM:  Patient would like to add, did have chest x ray and showed clear. Please call patient wants doct to call in cough medications.[facility]   [phone] please calll in to facility

## 2023-02-16 NOTE — Telephone Encounter (Signed)
Patient called [facility] and requested for provider to call in script for HYDROcodone bit-homatropine (HYCODAN) 5-1.5 MG/5ML syrup [[id number]]  DISCONTINUED   Patient is aware she has to pay out of pocket and she's fine with that.   Pharmacy confirmed as:  [id number] - [place], [place] - [address] AT [facility] [address], [place] [address] Phone: [phone]  Fax: [phone] DEA #: [id number]   Please advise patient at [phone]

## 2023-02-16 NOTE — Telephone Encounter (Signed)
Copied from CRM [id number]. Topic: General - Other >> Feb 16, 2023  8:44 AM Halina Maidens L wrote: Reason for CRM: patient wants doct to call in cough medications.[facility]   [phone] please calll in to facility

## 2023-02-19 ENCOUNTER — Ambulatory Visit: Payer: Medicare HMO | Admitting: Orthopaedic Surgery

## 2023-02-19 ENCOUNTER — Telehealth: Payer: Self-pay

## 2023-02-19 DIAGNOSIS — F0390 Unspecified dementia without behavioral disturbance: Secondary | ICD-10-CM | POA: Diagnosis not present

## 2023-02-19 DIAGNOSIS — R051 Acute cough: Secondary | ICD-10-CM | POA: Diagnosis not present

## 2023-02-19 DIAGNOSIS — J449 Chronic obstructive pulmonary disease, unspecified: Secondary | ICD-10-CM | POA: Diagnosis not present

## 2023-02-19 NOTE — Telephone Encounter (Signed)
Patient called wanting to know why her appointment was canceled today.  Stated that she is having pain with right wrist.  CB# [phone].  Please advise.  Thank you.

## 2023-02-20 DIAGNOSIS — H524 Presbyopia: Secondary | ICD-10-CM | POA: Diagnosis not present

## 2023-02-20 NOTE — Telephone Encounter (Signed)
Patient aware her facility cancelled this and she will reschedule

## 2023-02-26 ENCOUNTER — Ambulatory Visit: Payer: Medicare HMO | Admitting: Physician Assistant

## 2023-02-26 DIAGNOSIS — J449 Chronic obstructive pulmonary disease, unspecified: Secondary | ICD-10-CM | POA: Diagnosis not present

## 2023-02-26 DIAGNOSIS — F0390 Unspecified dementia without behavioral disturbance: Secondary | ICD-10-CM | POA: Diagnosis not present

## 2023-02-26 DIAGNOSIS — E039 Hypothyroidism, unspecified: Secondary | ICD-10-CM | POA: Diagnosis not present

## 2023-03-02 DIAGNOSIS — F02A2 Dementia in other diseases classified elsewhere, mild, with psychotic disturbance: Secondary | ICD-10-CM | POA: Diagnosis not present

## 2023-03-02 DIAGNOSIS — F32A Depression, unspecified: Secondary | ICD-10-CM | POA: Diagnosis not present

## 2023-03-05 ENCOUNTER — Ambulatory Visit (INDEPENDENT_AMBULATORY_CARE_PROVIDER_SITE_OTHER): Payer: Medicare HMO | Admitting: Physician Assistant

## 2023-03-05 ENCOUNTER — Ambulatory Visit: Payer: Medicare HMO | Admitting: Orthopaedic Surgery

## 2023-03-05 ENCOUNTER — Encounter: Payer: Self-pay | Admitting: Physician Assistant

## 2023-03-05 ENCOUNTER — Telehealth: Payer: Self-pay

## 2023-03-05 DIAGNOSIS — G5601 Carpal tunnel syndrome, right upper limb: Secondary | ICD-10-CM

## 2023-03-05 DIAGNOSIS — M65311 Trigger thumb, right thumb: Secondary | ICD-10-CM

## 2023-03-05 MED ORDER — LIDOCAINE HCL 1 % IJ SOLN
0.5000 mL | INTRAMUSCULAR | Status: AC | PRN
Start: 2023-03-05 — End: 2023-03-05
  Administered 2023-03-05: .5 mL

## 2023-03-05 MED ORDER — METHYLPREDNISOLONE ACETATE 40 MG/ML IJ SUSP
20.0000 mg | INTRAMUSCULAR | Status: AC | PRN
Start: 2023-03-05 — End: 2023-03-05
  Administered 2023-03-05: 20 mg

## 2023-03-05 NOTE — Progress Notes (Signed)
 HPI: Mary Blevins comes in today for a stating that the numbness tingling in her hand is resolved secondary to right carpal tunnel injection.  Reports now she is having right thumb that is locking.  She has tried Therapist, sports.  No known injury.  She is wearing the wrist splint on her right hand today.  Review of systems see HPI otherwise negative  Physical exam: General Well-developed well-nourished female seated in wheelchair in no acute distress. Psych: Alert and oriented x 3 Right hand subjective sensation intact throughout.  Right thumb active triggering.  Painful nodule at the A1 pulley right thumb.  Impression: Right carpal tunnel syndrome improved Right trigger thumb  Plan: Offered her cortisone injection for the right trigger thumb she is agreeable.  In regards to the wrist splint she can wear this at night for carpal tunnel syndrome.  She will follow-up with us  as needed.  Questions were encouraged and answered    Procedure Note  Patient: Mary Blevins             Date of Birth: [birth year]           MRN: [id number]             Visit Date: 03/05/2023  Procedures: Visit Diagnoses:  1. Trigger thumb, right thumb   2. Carpal tunnel syndrome, right upper limb     Hand/UE Inj: R thumb A1 for trigger finger on 03/05/2023 12:04 PM Medications: 0.5 mL lidocaine  1 %; 20 mg methylPREDNISolone  acetate 40 MG/ML Consent was given by the patient. Immediately prior to procedure a time out was called to verify the correct patient, procedure, equipment, support staff and site/side marked as required. Patient was prepped and draped in the usual sterile fashion.

## 2023-03-05 NOTE — Telephone Encounter (Signed)
 I called and spoke with patient. She wanted to know why you didn't inject her wrist, and instead injected her thumb. I explained that her wrist was doing well since her last injection, and we don't inject just to inject. Her thumb was injected due to triggering. Numbness is completely normal and may continue to trigger for a bit, as was explained today at her office visit. She said that every finger on her hand is not getting stuck since the injection today. She ask that I relay this to [place].

## 2023-03-05 NOTE — Telephone Encounter (Signed)
 Patient called stating that her right hand is numb from injection that she received today and would like a call back to discuss.  CB# [phone].  Please advise.  Thank you.

## 2023-03-09 ENCOUNTER — Telehealth: Payer: Self-pay

## 2023-03-09 NOTE — Telephone Encounter (Signed)
Copied from CRM [id number]. Topic: Clinical - Medical Advice >> Mar 09, 2023 12:10 PM Gery Pray wrote: Reason for CRM: Patient called and stated that she would like for Dr. Tanya Nones to call her personally. When I asked the patient what the call was in regards to she stated, "No, she wants to talk to him!" Patient can be contacted at [phone].

## 2023-03-12 DIAGNOSIS — J449 Chronic obstructive pulmonary disease, unspecified: Secondary | ICD-10-CM | POA: Diagnosis not present

## 2023-03-12 DIAGNOSIS — E039 Hypothyroidism, unspecified: Secondary | ICD-10-CM | POA: Diagnosis not present

## 2023-03-12 DIAGNOSIS — F0390 Unspecified dementia without behavioral disturbance: Secondary | ICD-10-CM | POA: Diagnosis not present

## 2023-03-12 DIAGNOSIS — R296 Repeated falls: Secondary | ICD-10-CM | POA: Diagnosis not present

## 2023-03-14 ENCOUNTER — Telehealth: Payer: Self-pay | Admitting: Physician Assistant

## 2023-03-14 NOTE — Telephone Encounter (Signed)
Patient called. Says her fingers are not doing any better. [phone]

## 2023-03-15 ENCOUNTER — Other Ambulatory Visit: Payer: Self-pay | Admitting: Radiology

## 2023-03-15 DIAGNOSIS — G5601 Carpal tunnel syndrome, right upper limb: Secondary | ICD-10-CM

## 2023-03-15 NOTE — Telephone Encounter (Signed)
 DONE

## 2023-04-02 DIAGNOSIS — J449 Chronic obstructive pulmonary disease, unspecified: Secondary | ICD-10-CM | POA: Diagnosis not present

## 2023-04-02 DIAGNOSIS — F0390 Unspecified dementia without behavioral disturbance: Secondary | ICD-10-CM | POA: Diagnosis not present

## 2023-04-02 DIAGNOSIS — J101 Influenza due to other identified influenza virus with other respiratory manifestations: Secondary | ICD-10-CM | POA: Diagnosis not present

## 2023-04-02 DIAGNOSIS — R112 Nausea with vomiting, unspecified: Secondary | ICD-10-CM | POA: Diagnosis not present

## 2023-04-02 DIAGNOSIS — R197 Diarrhea, unspecified: Secondary | ICD-10-CM | POA: Diagnosis not present

## 2023-04-02 DIAGNOSIS — E039 Hypothyroidism, unspecified: Secondary | ICD-10-CM | POA: Diagnosis not present

## 2023-04-03 DIAGNOSIS — F02A2 Dementia in other diseases classified elsewhere, mild, with psychotic disturbance: Secondary | ICD-10-CM | POA: Diagnosis not present

## 2023-04-03 DIAGNOSIS — F32A Depression, unspecified: Secondary | ICD-10-CM | POA: Diagnosis not present

## 2023-04-10 DIAGNOSIS — B351 Tinea unguium: Secondary | ICD-10-CM | POA: Diagnosis not present

## 2023-04-10 DIAGNOSIS — M2042 Other hammer toe(s) (acquired), left foot: Secondary | ICD-10-CM | POA: Diagnosis not present

## 2023-04-10 DIAGNOSIS — M2041 Other hammer toe(s) (acquired), right foot: Secondary | ICD-10-CM | POA: Diagnosis not present

## 2023-04-10 DIAGNOSIS — I7091 Generalized atherosclerosis: Secondary | ICD-10-CM | POA: Diagnosis not present

## 2023-04-24 DIAGNOSIS — F32A Depression, unspecified: Secondary | ICD-10-CM | POA: Diagnosis not present

## 2023-04-24 DIAGNOSIS — I358 Other nonrheumatic aortic valve disorders: Secondary | ICD-10-CM | POA: Diagnosis not present

## 2023-04-24 DIAGNOSIS — G5600 Carpal tunnel syndrome, unspecified upper limb: Secondary | ICD-10-CM | POA: Diagnosis not present

## 2023-04-26 DIAGNOSIS — R262 Difficulty in walking, not elsewhere classified: Secondary | ICD-10-CM | POA: Diagnosis not present

## 2023-04-26 DIAGNOSIS — M6281 Muscle weakness (generalized): Secondary | ICD-10-CM | POA: Diagnosis not present

## 2023-04-27 DIAGNOSIS — M6281 Muscle weakness (generalized): Secondary | ICD-10-CM | POA: Diagnosis not present

## 2023-04-27 DIAGNOSIS — R262 Difficulty in walking, not elsewhere classified: Secondary | ICD-10-CM | POA: Diagnosis not present

## 2023-04-27 DIAGNOSIS — I10 Essential (primary) hypertension: Secondary | ICD-10-CM | POA: Diagnosis not present

## 2023-04-30 DIAGNOSIS — R262 Difficulty in walking, not elsewhere classified: Secondary | ICD-10-CM | POA: Diagnosis not present

## 2023-04-30 DIAGNOSIS — M6281 Muscle weakness (generalized): Secondary | ICD-10-CM | POA: Diagnosis not present

## 2023-05-01 DIAGNOSIS — R262 Difficulty in walking, not elsewhere classified: Secondary | ICD-10-CM | POA: Diagnosis not present

## 2023-05-01 DIAGNOSIS — M6281 Muscle weakness (generalized): Secondary | ICD-10-CM | POA: Diagnosis not present

## 2023-05-02 DIAGNOSIS — M6281 Muscle weakness (generalized): Secondary | ICD-10-CM | POA: Diagnosis not present

## 2023-05-02 DIAGNOSIS — R262 Difficulty in walking, not elsewhere classified: Secondary | ICD-10-CM | POA: Diagnosis not present

## 2023-05-03 DIAGNOSIS — R262 Difficulty in walking, not elsewhere classified: Secondary | ICD-10-CM | POA: Diagnosis not present

## 2023-05-03 DIAGNOSIS — Z961 Presence of intraocular lens: Secondary | ICD-10-CM | POA: Diagnosis not present

## 2023-05-03 DIAGNOSIS — M6281 Muscle weakness (generalized): Secondary | ICD-10-CM | POA: Diagnosis not present

## 2023-05-03 DIAGNOSIS — H40013 Open angle with borderline findings, low risk, bilateral: Secondary | ICD-10-CM | POA: Diagnosis not present

## 2023-05-03 DIAGNOSIS — H02401 Unspecified ptosis of right eyelid: Secondary | ICD-10-CM | POA: Diagnosis not present

## 2023-05-04 DIAGNOSIS — M6281 Muscle weakness (generalized): Secondary | ICD-10-CM | POA: Diagnosis not present

## 2023-05-04 DIAGNOSIS — R262 Difficulty in walking, not elsewhere classified: Secondary | ICD-10-CM | POA: Diagnosis not present

## 2023-05-07 DIAGNOSIS — F32A Depression, unspecified: Secondary | ICD-10-CM | POA: Diagnosis not present

## 2023-05-07 DIAGNOSIS — F02A2 Dementia in other diseases classified elsewhere, mild, with psychotic disturbance: Secondary | ICD-10-CM | POA: Diagnosis not present

## 2023-05-16 DIAGNOSIS — J449 Chronic obstructive pulmonary disease, unspecified: Secondary | ICD-10-CM | POA: Diagnosis not present

## 2023-05-16 DIAGNOSIS — E039 Hypothyroidism, unspecified: Secondary | ICD-10-CM | POA: Diagnosis not present

## 2023-05-16 DIAGNOSIS — F0390 Unspecified dementia without behavioral disturbance: Secondary | ICD-10-CM | POA: Diagnosis not present

## 2023-05-21 ENCOUNTER — Ambulatory Visit: Admitting: Orthopaedic Surgery

## 2023-06-13 DIAGNOSIS — J449 Chronic obstructive pulmonary disease, unspecified: Secondary | ICD-10-CM | POA: Diagnosis not present

## 2023-06-13 DIAGNOSIS — F0390 Unspecified dementia without behavioral disturbance: Secondary | ICD-10-CM | POA: Diagnosis not present

## 2023-06-13 DIAGNOSIS — E039 Hypothyroidism, unspecified: Secondary | ICD-10-CM | POA: Diagnosis not present

## 2023-06-21 DIAGNOSIS — F32A Depression, unspecified: Secondary | ICD-10-CM | POA: Diagnosis not present

## 2023-06-21 DIAGNOSIS — F02A2 Dementia in other diseases classified elsewhere, mild, with psychotic disturbance: Secondary | ICD-10-CM | POA: Diagnosis not present

## 2023-06-25 DIAGNOSIS — I7091 Generalized atherosclerosis: Secondary | ICD-10-CM | POA: Diagnosis not present

## 2023-06-25 DIAGNOSIS — B351 Tinea unguium: Secondary | ICD-10-CM | POA: Diagnosis not present

## 2023-06-27 DIAGNOSIS — F32A Depression, unspecified: Secondary | ICD-10-CM | POA: Diagnosis not present

## 2023-06-27 DIAGNOSIS — G5600 Carpal tunnel syndrome, unspecified upper limb: Secondary | ICD-10-CM | POA: Diagnosis not present

## 2023-06-27 DIAGNOSIS — I358 Other nonrheumatic aortic valve disorders: Secondary | ICD-10-CM | POA: Diagnosis not present

## 2023-07-03 DIAGNOSIS — E030 Congenital hypothyroidism with diffuse goiter: Secondary | ICD-10-CM | POA: Diagnosis not present

## 2023-07-16 DIAGNOSIS — F0390 Unspecified dementia without behavioral disturbance: Secondary | ICD-10-CM | POA: Diagnosis not present

## 2023-07-16 DIAGNOSIS — J449 Chronic obstructive pulmonary disease, unspecified: Secondary | ICD-10-CM | POA: Diagnosis not present

## 2023-07-16 DIAGNOSIS — E039 Hypothyroidism, unspecified: Secondary | ICD-10-CM | POA: Diagnosis not present

## 2023-07-23 ENCOUNTER — Telehealth: Payer: Self-pay | Admitting: Family Medicine

## 2023-07-23 DIAGNOSIS — F32A Depression, unspecified: Secondary | ICD-10-CM | POA: Diagnosis not present

## 2023-07-23 DIAGNOSIS — F02A2 Dementia in other diseases classified elsewhere, mild, with psychotic disturbance: Secondary | ICD-10-CM | POA: Diagnosis not present

## 2023-07-23 NOTE — Telephone Encounter (Signed)
 Patient is currently a resident at [facility] and [facility], phone number is [phone]. As per patient's niece Erminio Samuel, patient has refused to continue taking FLUoxetine  (PROZAC ) 20 MG tablet [[id number]] as prescribed by the facility for depression because she doesn't think she needs it. As per Erminio, the patient stated that the only way she will take the fluoxetine  is if it's prescribed by her PCP Dr. Duanne.  Facility advised that the guardian of person is Lolita Phlegm, patient's daughter, who has to make medical decisions. I spoke with Lolita about this, and she requested for Dr. Duanne to fax a letter advising that Ms. Dicocco should continue taking the fluoxetine .   Facility's fax number is [phone], attn: Coretta.

## 2023-07-25 NOTE — Telephone Encounter (Signed)
 Letter created in chart. Printed and faxed to [facility] and [facility], phone number is [phone]. Fax letter was sent to [phone] attn. Coretta on 07/25/2023

## 2023-07-30 DIAGNOSIS — J449 Chronic obstructive pulmonary disease, unspecified: Secondary | ICD-10-CM | POA: Diagnosis not present

## 2023-07-30 DIAGNOSIS — R2689 Other abnormalities of gait and mobility: Secondary | ICD-10-CM | POA: Diagnosis not present

## 2023-07-30 DIAGNOSIS — R2681 Unsteadiness on feet: Secondary | ICD-10-CM | POA: Diagnosis not present

## 2023-07-30 DIAGNOSIS — M6281 Muscle weakness (generalized): Secondary | ICD-10-CM | POA: Diagnosis not present

## 2023-07-31 DIAGNOSIS — R2681 Unsteadiness on feet: Secondary | ICD-10-CM | POA: Diagnosis not present

## 2023-07-31 DIAGNOSIS — R2689 Other abnormalities of gait and mobility: Secondary | ICD-10-CM | POA: Diagnosis not present

## 2023-07-31 DIAGNOSIS — J449 Chronic obstructive pulmonary disease, unspecified: Secondary | ICD-10-CM | POA: Diagnosis not present

## 2023-07-31 DIAGNOSIS — M6281 Muscle weakness (generalized): Secondary | ICD-10-CM | POA: Diagnosis not present

## 2023-08-01 DIAGNOSIS — M6281 Muscle weakness (generalized): Secondary | ICD-10-CM | POA: Diagnosis not present

## 2023-08-01 DIAGNOSIS — F32A Depression, unspecified: Secondary | ICD-10-CM | POA: Diagnosis not present

## 2023-08-01 DIAGNOSIS — J449 Chronic obstructive pulmonary disease, unspecified: Secondary | ICD-10-CM | POA: Diagnosis not present

## 2023-08-01 DIAGNOSIS — I358 Other nonrheumatic aortic valve disorders: Secondary | ICD-10-CM | POA: Diagnosis not present

## 2023-08-01 DIAGNOSIS — R2681 Unsteadiness on feet: Secondary | ICD-10-CM | POA: Diagnosis not present

## 2023-08-01 DIAGNOSIS — G5600 Carpal tunnel syndrome, unspecified upper limb: Secondary | ICD-10-CM | POA: Diagnosis not present

## 2023-08-01 DIAGNOSIS — R2689 Other abnormalities of gait and mobility: Secondary | ICD-10-CM | POA: Diagnosis not present

## 2023-08-02 DIAGNOSIS — R2681 Unsteadiness on feet: Secondary | ICD-10-CM | POA: Diagnosis not present

## 2023-08-02 DIAGNOSIS — M6281 Muscle weakness (generalized): Secondary | ICD-10-CM | POA: Diagnosis not present

## 2023-08-02 DIAGNOSIS — R2689 Other abnormalities of gait and mobility: Secondary | ICD-10-CM | POA: Diagnosis not present

## 2023-08-02 DIAGNOSIS — J449 Chronic obstructive pulmonary disease, unspecified: Secondary | ICD-10-CM | POA: Diagnosis not present

## 2023-08-03 DIAGNOSIS — M6281 Muscle weakness (generalized): Secondary | ICD-10-CM | POA: Diagnosis not present

## 2023-08-03 DIAGNOSIS — R2681 Unsteadiness on feet: Secondary | ICD-10-CM | POA: Diagnosis not present

## 2023-08-03 DIAGNOSIS — R2689 Other abnormalities of gait and mobility: Secondary | ICD-10-CM | POA: Diagnosis not present

## 2023-08-03 DIAGNOSIS — J449 Chronic obstructive pulmonary disease, unspecified: Secondary | ICD-10-CM | POA: Diagnosis not present

## 2023-08-04 DIAGNOSIS — J449 Chronic obstructive pulmonary disease, unspecified: Secondary | ICD-10-CM | POA: Diagnosis not present

## 2023-08-04 DIAGNOSIS — R2681 Unsteadiness on feet: Secondary | ICD-10-CM | POA: Diagnosis not present

## 2023-08-04 DIAGNOSIS — R2689 Other abnormalities of gait and mobility: Secondary | ICD-10-CM | POA: Diagnosis not present

## 2023-08-04 DIAGNOSIS — M6281 Muscle weakness (generalized): Secondary | ICD-10-CM | POA: Diagnosis not present

## 2023-08-05 DIAGNOSIS — M6281 Muscle weakness (generalized): Secondary | ICD-10-CM | POA: Diagnosis not present

## 2023-08-05 DIAGNOSIS — R2689 Other abnormalities of gait and mobility: Secondary | ICD-10-CM | POA: Diagnosis not present

## 2023-08-05 DIAGNOSIS — J449 Chronic obstructive pulmonary disease, unspecified: Secondary | ICD-10-CM | POA: Diagnosis not present

## 2023-08-05 DIAGNOSIS — R2681 Unsteadiness on feet: Secondary | ICD-10-CM | POA: Diagnosis not present

## 2023-08-06 DIAGNOSIS — J449 Chronic obstructive pulmonary disease, unspecified: Secondary | ICD-10-CM | POA: Diagnosis not present

## 2023-08-06 DIAGNOSIS — R2689 Other abnormalities of gait and mobility: Secondary | ICD-10-CM | POA: Diagnosis not present

## 2023-08-06 DIAGNOSIS — M6281 Muscle weakness (generalized): Secondary | ICD-10-CM | POA: Diagnosis not present

## 2023-08-06 DIAGNOSIS — R2681 Unsteadiness on feet: Secondary | ICD-10-CM | POA: Diagnosis not present

## 2023-08-07 DIAGNOSIS — R2681 Unsteadiness on feet: Secondary | ICD-10-CM | POA: Diagnosis not present

## 2023-08-07 DIAGNOSIS — R2689 Other abnormalities of gait and mobility: Secondary | ICD-10-CM | POA: Diagnosis not present

## 2023-08-07 DIAGNOSIS — J449 Chronic obstructive pulmonary disease, unspecified: Secondary | ICD-10-CM | POA: Diagnosis not present

## 2023-08-07 DIAGNOSIS — M6281 Muscle weakness (generalized): Secondary | ICD-10-CM | POA: Diagnosis not present

## 2023-08-08 DIAGNOSIS — M6281 Muscle weakness (generalized): Secondary | ICD-10-CM | POA: Diagnosis not present

## 2023-08-08 DIAGNOSIS — R2689 Other abnormalities of gait and mobility: Secondary | ICD-10-CM | POA: Diagnosis not present

## 2023-08-08 DIAGNOSIS — J449 Chronic obstructive pulmonary disease, unspecified: Secondary | ICD-10-CM | POA: Diagnosis not present

## 2023-08-08 DIAGNOSIS — R2681 Unsteadiness on feet: Secondary | ICD-10-CM | POA: Diagnosis not present

## 2023-08-09 DIAGNOSIS — J449 Chronic obstructive pulmonary disease, unspecified: Secondary | ICD-10-CM | POA: Diagnosis not present

## 2023-08-09 DIAGNOSIS — R2689 Other abnormalities of gait and mobility: Secondary | ICD-10-CM | POA: Diagnosis not present

## 2023-08-09 DIAGNOSIS — R2681 Unsteadiness on feet: Secondary | ICD-10-CM | POA: Diagnosis not present

## 2023-08-09 DIAGNOSIS — M6281 Muscle weakness (generalized): Secondary | ICD-10-CM | POA: Diagnosis not present

## 2023-08-10 DIAGNOSIS — J449 Chronic obstructive pulmonary disease, unspecified: Secondary | ICD-10-CM | POA: Diagnosis not present

## 2023-08-10 DIAGNOSIS — M6281 Muscle weakness (generalized): Secondary | ICD-10-CM | POA: Diagnosis not present

## 2023-08-10 DIAGNOSIS — R2681 Unsteadiness on feet: Secondary | ICD-10-CM | POA: Diagnosis not present

## 2023-08-10 DIAGNOSIS — R2689 Other abnormalities of gait and mobility: Secondary | ICD-10-CM | POA: Diagnosis not present

## 2023-08-11 DIAGNOSIS — M6281 Muscle weakness (generalized): Secondary | ICD-10-CM | POA: Diagnosis not present

## 2023-08-11 DIAGNOSIS — J449 Chronic obstructive pulmonary disease, unspecified: Secondary | ICD-10-CM | POA: Diagnosis not present

## 2023-08-11 DIAGNOSIS — R2689 Other abnormalities of gait and mobility: Secondary | ICD-10-CM | POA: Diagnosis not present

## 2023-08-11 DIAGNOSIS — R2681 Unsteadiness on feet: Secondary | ICD-10-CM | POA: Diagnosis not present

## 2023-08-13 DIAGNOSIS — J449 Chronic obstructive pulmonary disease, unspecified: Secondary | ICD-10-CM | POA: Diagnosis not present

## 2023-08-13 DIAGNOSIS — E039 Hypothyroidism, unspecified: Secondary | ICD-10-CM | POA: Diagnosis not present

## 2023-08-13 DIAGNOSIS — F0390 Unspecified dementia without behavioral disturbance: Secondary | ICD-10-CM | POA: Diagnosis not present

## 2023-08-13 DIAGNOSIS — J069 Acute upper respiratory infection, unspecified: Secondary | ICD-10-CM | POA: Diagnosis not present

## 2023-08-14 DIAGNOSIS — M6281 Muscle weakness (generalized): Secondary | ICD-10-CM | POA: Diagnosis not present

## 2023-08-14 DIAGNOSIS — J449 Chronic obstructive pulmonary disease, unspecified: Secondary | ICD-10-CM | POA: Diagnosis not present

## 2023-08-14 DIAGNOSIS — R2681 Unsteadiness on feet: Secondary | ICD-10-CM | POA: Diagnosis not present

## 2023-08-14 DIAGNOSIS — R2689 Other abnormalities of gait and mobility: Secondary | ICD-10-CM | POA: Diagnosis not present

## 2023-08-16 DIAGNOSIS — R2689 Other abnormalities of gait and mobility: Secondary | ICD-10-CM | POA: Diagnosis not present

## 2023-08-16 DIAGNOSIS — J449 Chronic obstructive pulmonary disease, unspecified: Secondary | ICD-10-CM | POA: Diagnosis not present

## 2023-08-16 DIAGNOSIS — M6281 Muscle weakness (generalized): Secondary | ICD-10-CM | POA: Diagnosis not present

## 2023-08-16 DIAGNOSIS — R2681 Unsteadiness on feet: Secondary | ICD-10-CM | POA: Diagnosis not present

## 2023-08-17 DIAGNOSIS — R2689 Other abnormalities of gait and mobility: Secondary | ICD-10-CM | POA: Diagnosis not present

## 2023-08-17 DIAGNOSIS — R2681 Unsteadiness on feet: Secondary | ICD-10-CM | POA: Diagnosis not present

## 2023-08-17 DIAGNOSIS — J449 Chronic obstructive pulmonary disease, unspecified: Secondary | ICD-10-CM | POA: Diagnosis not present

## 2023-08-17 DIAGNOSIS — M6281 Muscle weakness (generalized): Secondary | ICD-10-CM | POA: Diagnosis not present

## 2023-08-21 DIAGNOSIS — M6281 Muscle weakness (generalized): Secondary | ICD-10-CM | POA: Diagnosis not present

## 2023-08-21 DIAGNOSIS — R2681 Unsteadiness on feet: Secondary | ICD-10-CM | POA: Diagnosis not present

## 2023-08-21 DIAGNOSIS — J449 Chronic obstructive pulmonary disease, unspecified: Secondary | ICD-10-CM | POA: Diagnosis not present

## 2023-08-21 DIAGNOSIS — R2689 Other abnormalities of gait and mobility: Secondary | ICD-10-CM | POA: Diagnosis not present

## 2023-08-22 DIAGNOSIS — R2689 Other abnormalities of gait and mobility: Secondary | ICD-10-CM | POA: Diagnosis not present

## 2023-08-22 DIAGNOSIS — J449 Chronic obstructive pulmonary disease, unspecified: Secondary | ICD-10-CM | POA: Diagnosis not present

## 2023-08-22 DIAGNOSIS — M6281 Muscle weakness (generalized): Secondary | ICD-10-CM | POA: Diagnosis not present

## 2023-08-22 DIAGNOSIS — R2681 Unsteadiness on feet: Secondary | ICD-10-CM | POA: Diagnosis not present

## 2023-08-23 DIAGNOSIS — R2681 Unsteadiness on feet: Secondary | ICD-10-CM | POA: Diagnosis not present

## 2023-08-23 DIAGNOSIS — J449 Chronic obstructive pulmonary disease, unspecified: Secondary | ICD-10-CM | POA: Diagnosis not present

## 2023-08-23 DIAGNOSIS — M6281 Muscle weakness (generalized): Secondary | ICD-10-CM | POA: Diagnosis not present

## 2023-08-23 DIAGNOSIS — R2689 Other abnormalities of gait and mobility: Secondary | ICD-10-CM | POA: Diagnosis not present

## 2023-08-24 DIAGNOSIS — R2689 Other abnormalities of gait and mobility: Secondary | ICD-10-CM | POA: Diagnosis not present

## 2023-08-24 DIAGNOSIS — M6281 Muscle weakness (generalized): Secondary | ICD-10-CM | POA: Diagnosis not present

## 2023-08-27 DIAGNOSIS — R2689 Other abnormalities of gait and mobility: Secondary | ICD-10-CM | POA: Diagnosis not present

## 2023-08-27 DIAGNOSIS — M6281 Muscle weakness (generalized): Secondary | ICD-10-CM | POA: Diagnosis not present

## 2023-08-29 DIAGNOSIS — F02A2 Dementia in other diseases classified elsewhere, mild, with psychotic disturbance: Secondary | ICD-10-CM | POA: Diagnosis not present

## 2023-08-29 DIAGNOSIS — F32A Depression, unspecified: Secondary | ICD-10-CM | POA: Diagnosis not present

## 2023-09-11 DIAGNOSIS — H40013 Open angle with borderline findings, low risk, bilateral: Secondary | ICD-10-CM | POA: Diagnosis not present

## 2023-09-11 DIAGNOSIS — Z961 Presence of intraocular lens: Secondary | ICD-10-CM | POA: Diagnosis not present

## 2023-09-11 DIAGNOSIS — H02401 Unspecified ptosis of right eyelid: Secondary | ICD-10-CM | POA: Diagnosis not present

## 2023-09-12 DIAGNOSIS — I358 Other nonrheumatic aortic valve disorders: Secondary | ICD-10-CM | POA: Diagnosis not present

## 2023-09-12 DIAGNOSIS — G5600 Carpal tunnel syndrome, unspecified upper limb: Secondary | ICD-10-CM | POA: Diagnosis not present

## 2023-09-12 DIAGNOSIS — F32A Depression, unspecified: Secondary | ICD-10-CM | POA: Diagnosis not present

## 2023-09-21 ENCOUNTER — Telehealth: Payer: Self-pay

## 2023-09-21 DIAGNOSIS — F32A Depression, unspecified: Secondary | ICD-10-CM | POA: Diagnosis not present

## 2023-09-21 DIAGNOSIS — F02A2 Dementia in other diseases classified elsewhere, mild, with psychotic disturbance: Secondary | ICD-10-CM | POA: Diagnosis not present

## 2023-09-21 NOTE — Telephone Encounter (Signed)
 Copied from CRM [id number]. Topic: General - Other >> Sep 21, 2023 11:31 AM Rea ORN wrote: Reason for CRM: Pt stated she is at [facility] and is unhappy and wants to leave. She would like PCP to assist her with discharging and being cared for by family. Please call back [phone] and request to speak with pt.

## 2023-09-27 DIAGNOSIS — F02A2 Dementia in other diseases classified elsewhere, mild, with psychotic disturbance: Secondary | ICD-10-CM | POA: Diagnosis not present

## 2023-09-27 DIAGNOSIS — F32A Depression, unspecified: Secondary | ICD-10-CM | POA: Diagnosis not present

## 2023-10-05 DIAGNOSIS — E039 Hypothyroidism, unspecified: Secondary | ICD-10-CM | POA: Diagnosis not present

## 2023-10-05 DIAGNOSIS — I10 Essential (primary) hypertension: Secondary | ICD-10-CM | POA: Diagnosis not present

## 2023-10-05 DIAGNOSIS — M6281 Muscle weakness (generalized): Secondary | ICD-10-CM | POA: Diagnosis not present

## 2023-10-05 DIAGNOSIS — J449 Chronic obstructive pulmonary disease, unspecified: Secondary | ICD-10-CM | POA: Diagnosis not present

## 2023-10-05 DIAGNOSIS — F0390 Unspecified dementia without behavioral disturbance: Secondary | ICD-10-CM | POA: Diagnosis not present

## 2023-10-05 DIAGNOSIS — F32A Depression, unspecified: Secondary | ICD-10-CM | POA: Diagnosis not present

## 2023-11-05 DIAGNOSIS — F02A2 Dementia in other diseases classified elsewhere, mild, with psychotic disturbance: Secondary | ICD-10-CM | POA: Diagnosis not present

## 2023-11-05 DIAGNOSIS — F32A Depression, unspecified: Secondary | ICD-10-CM | POA: Diagnosis not present

## 2023-11-20 DIAGNOSIS — F0390 Unspecified dementia without behavioral disturbance: Secondary | ICD-10-CM | POA: Diagnosis not present

## 2023-11-20 DIAGNOSIS — J449 Chronic obstructive pulmonary disease, unspecified: Secondary | ICD-10-CM | POA: Diagnosis not present

## 2023-11-20 DIAGNOSIS — E039 Hypothyroidism, unspecified: Secondary | ICD-10-CM | POA: Diagnosis not present

## 2023-12-04 DIAGNOSIS — F32A Depression, unspecified: Secondary | ICD-10-CM | POA: Diagnosis not present

## 2023-12-04 DIAGNOSIS — F02A2 Dementia in other diseases classified elsewhere, mild, with psychotic disturbance: Secondary | ICD-10-CM | POA: Diagnosis not present

## 2023-12-05 DIAGNOSIS — I7091 Generalized atherosclerosis: Secondary | ICD-10-CM | POA: Diagnosis not present

## 2023-12-05 DIAGNOSIS — B351 Tinea unguium: Secondary | ICD-10-CM | POA: Diagnosis not present

## 2023-12-11 DIAGNOSIS — E039 Hypothyroidism, unspecified: Secondary | ICD-10-CM | POA: Diagnosis not present

## 2023-12-11 DIAGNOSIS — F0390 Unspecified dementia without behavioral disturbance: Secondary | ICD-10-CM | POA: Diagnosis not present

## 2023-12-11 DIAGNOSIS — J449 Chronic obstructive pulmonary disease, unspecified: Secondary | ICD-10-CM | POA: Diagnosis not present

## 2023-12-12 DIAGNOSIS — F32A Depression, unspecified: Secondary | ICD-10-CM | POA: Diagnosis not present

## 2023-12-12 DIAGNOSIS — F02A2 Dementia in other diseases classified elsewhere, mild, with psychotic disturbance: Secondary | ICD-10-CM | POA: Diagnosis not present

## 2023-12-25 DIAGNOSIS — I358 Other nonrheumatic aortic valve disorders: Secondary | ICD-10-CM | POA: Diagnosis not present

## 2023-12-25 DIAGNOSIS — F32A Depression, unspecified: Secondary | ICD-10-CM | POA: Diagnosis not present

## 2023-12-25 DIAGNOSIS — G5600 Carpal tunnel syndrome, unspecified upper limb: Secondary | ICD-10-CM | POA: Diagnosis not present
